# Patient Record
Sex: Female | Born: 1955 | Race: Black or African American | Hispanic: No | Marital: Single | Smoking: Never smoker
Health system: Southern US, Community
[De-identification: ages and names within clinical notes are randomized; demographics above are authoritative.]

## PROBLEM LIST (undated history)

## (undated) DIAGNOSIS — H1851 Endothelial corneal dystrophy: Secondary | ICD-10-CM

## (undated) DIAGNOSIS — K829 Disease of gallbladder, unspecified: Secondary | ICD-10-CM

## (undated) DIAGNOSIS — F32A Depression, unspecified: Secondary | ICD-10-CM

## (undated) DIAGNOSIS — R011 Cardiac murmur, unspecified: Secondary | ICD-10-CM

## (undated) DIAGNOSIS — R600 Localized edema: Secondary | ICD-10-CM

## (undated) DIAGNOSIS — I10 Essential (primary) hypertension: Secondary | ICD-10-CM

## (undated) DIAGNOSIS — H269 Unspecified cataract: Secondary | ICD-10-CM

## (undated) DIAGNOSIS — H18519 Endothelial corneal dystrophy, unspecified eye: Secondary | ICD-10-CM

## (undated) DIAGNOSIS — N6452 Nipple discharge: Secondary | ICD-10-CM

## (undated) DIAGNOSIS — C55 Malignant neoplasm of uterus, part unspecified: Secondary | ICD-10-CM

## (undated) DIAGNOSIS — F329 Major depressive disorder, single episode, unspecified: Secondary | ICD-10-CM

## (undated) DIAGNOSIS — N6320 Unspecified lump in the left breast, unspecified quadrant: Secondary | ICD-10-CM

## (undated) DIAGNOSIS — N8500 Endometrial hyperplasia, unspecified: Secondary | ICD-10-CM

## (undated) DIAGNOSIS — E119 Type 2 diabetes mellitus without complications: Secondary | ICD-10-CM

## (undated) HISTORY — DX: Depression, unspecified: F32.A

## (undated) HISTORY — DX: Malignant neoplasm of uterus, part unspecified: C55

## (undated) HISTORY — DX: Endothelial corneal dystrophy, unspecified eye: H18.519

## (undated) HISTORY — DX: Nipple discharge: N64.52

## (undated) HISTORY — DX: Localized edema: R60.0

## (undated) HISTORY — DX: Essential (primary) hypertension: I10

## (undated) HISTORY — DX: Major depressive disorder, single episode, unspecified: F32.9

## (undated) HISTORY — DX: Disease of gallbladder, unspecified: K82.9

## (undated) HISTORY — DX: Endothelial corneal dystrophy: H18.51

---

## 1987-02-21 HISTORY — PX: CHOLECYSTECTOMY: SHX55

## 2002-05-27 ENCOUNTER — Encounter: Payer: Self-pay | Admitting: Obstetrics

## 2002-05-27 ENCOUNTER — Ambulatory Visit (HOSPITAL_COMMUNITY): Admission: RE | Admit: 2002-05-27 | Discharge: 2002-05-27 | Payer: Self-pay | Admitting: Obstetrics

## 2005-05-30 ENCOUNTER — Encounter: Admission: RE | Admit: 2005-05-30 | Discharge: 2005-05-30 | Payer: Self-pay | Admitting: Internal Medicine

## 2005-05-30 IMAGING — US US RENAL
1 series · 14 of 25 positions shown · non-contrast
Comparison: none

CLINICAL DATA: Hematuria

Renal ultrasound:
No previous for comparison. Right kidney measures at least 12 cm in length, as
does the left. Negative for renal mass or hydronephrosis. Urinary bladder is
incompletely distended, unremarkable.

[Series 1: unknown · 0.26mm/px · 14 of 29 slices shown]
[im 1/29]
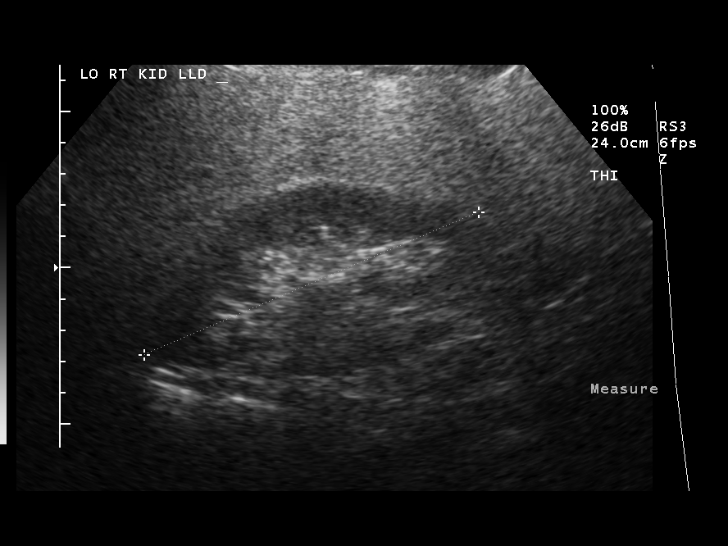
[im 3/29]
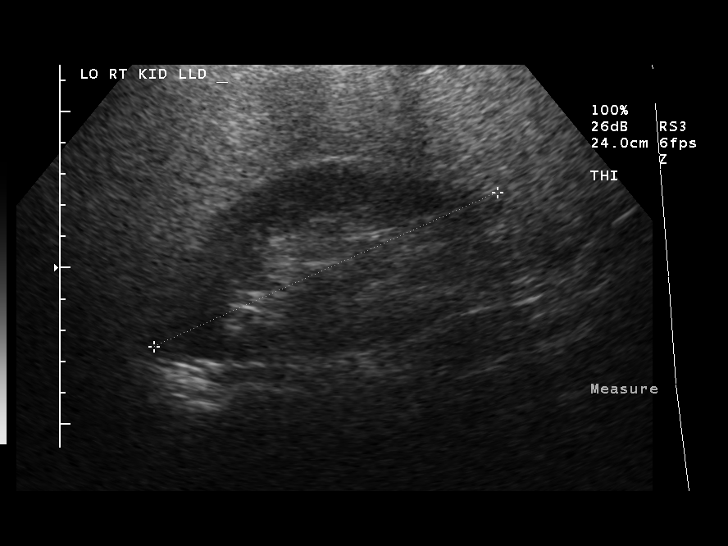
[im 5/29]
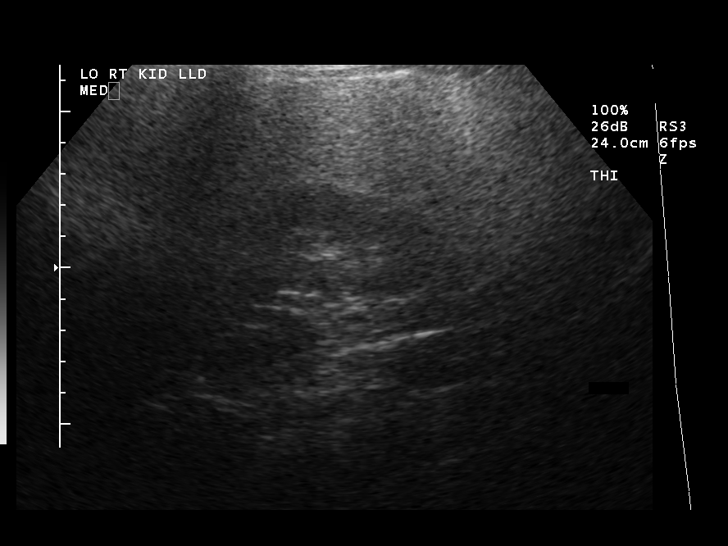
[im 8/29]
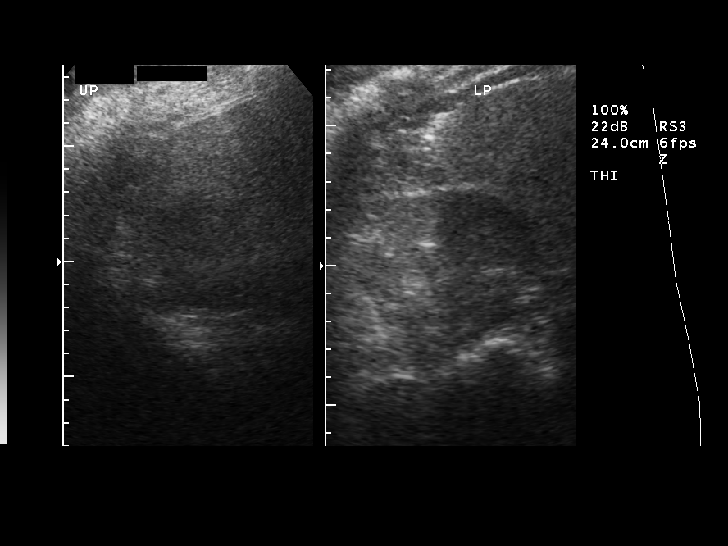
[im 10/29]
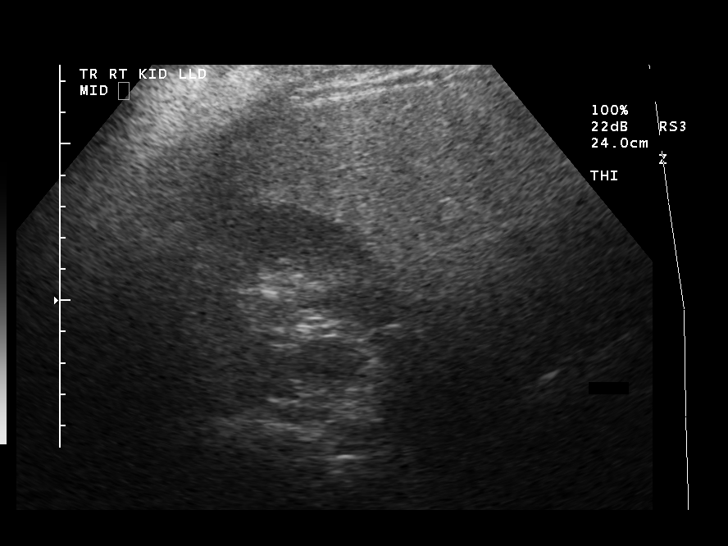
[im 11/29]
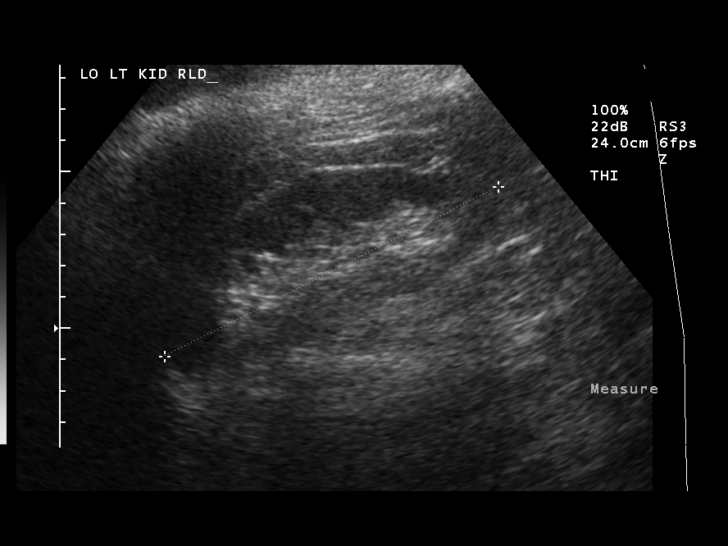
[im 13/29]
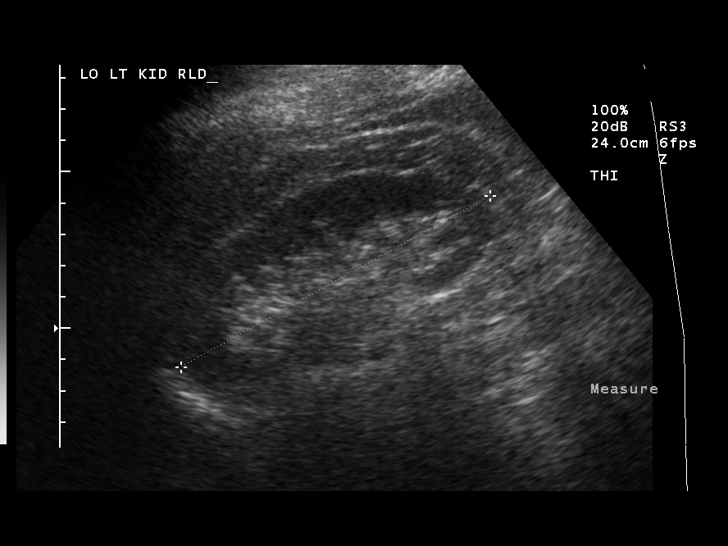
[im 16/29]
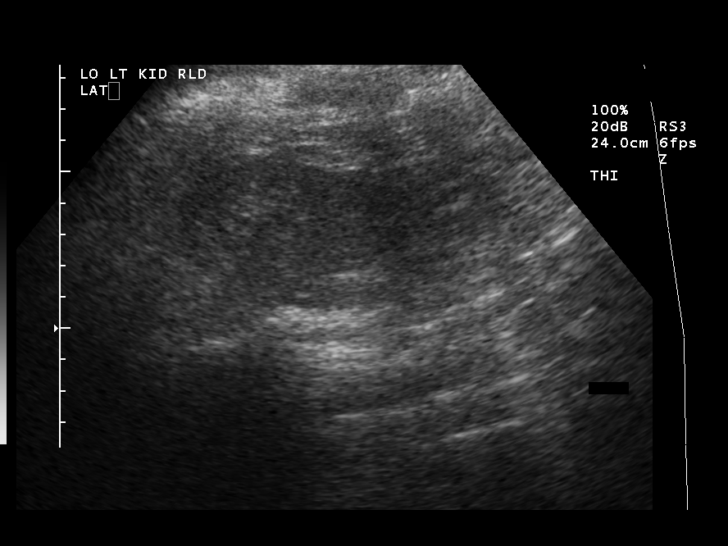
[im 18/29]
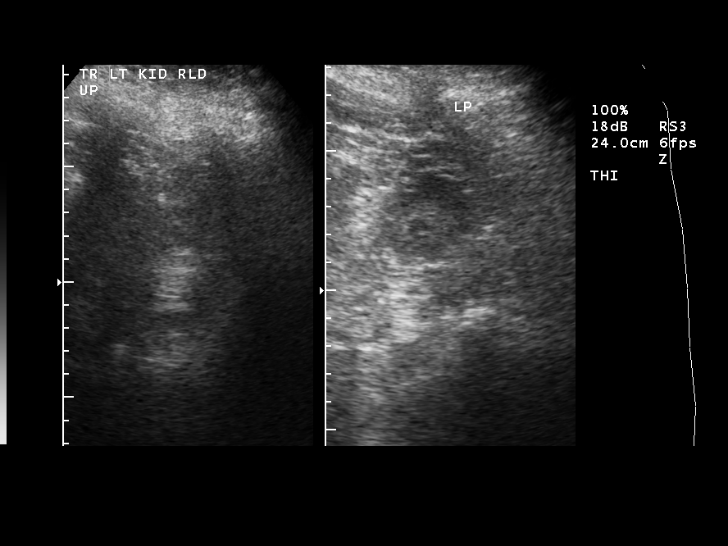
[im 19/29]
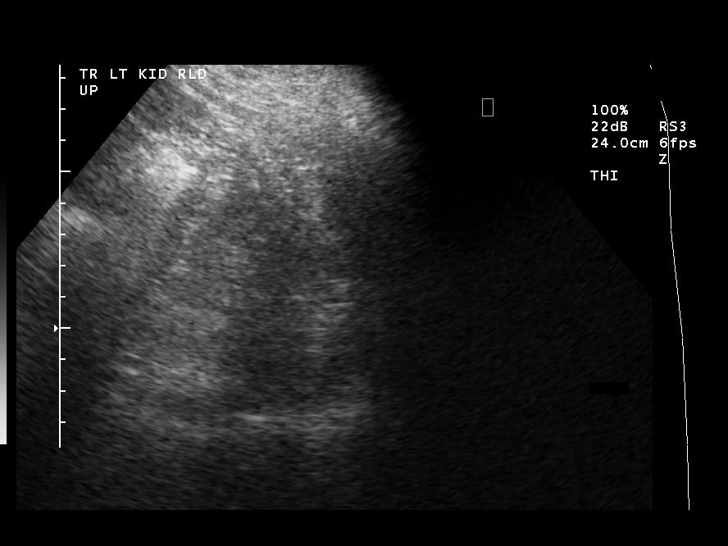
[im 22/29]
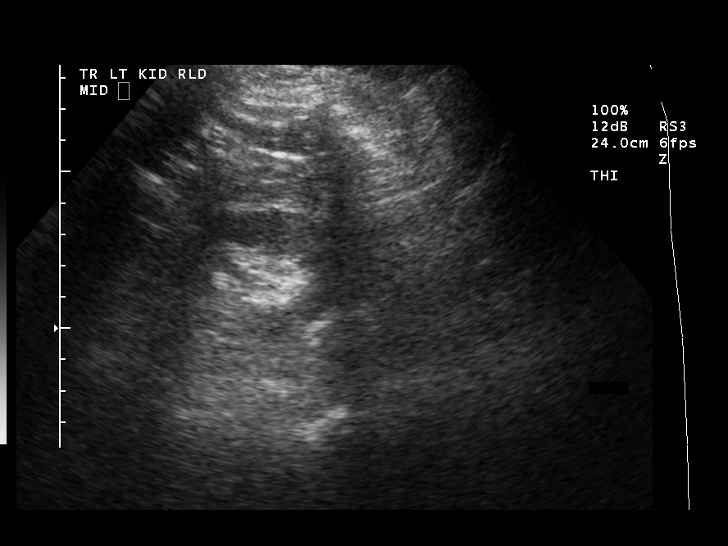
[im 24/29]
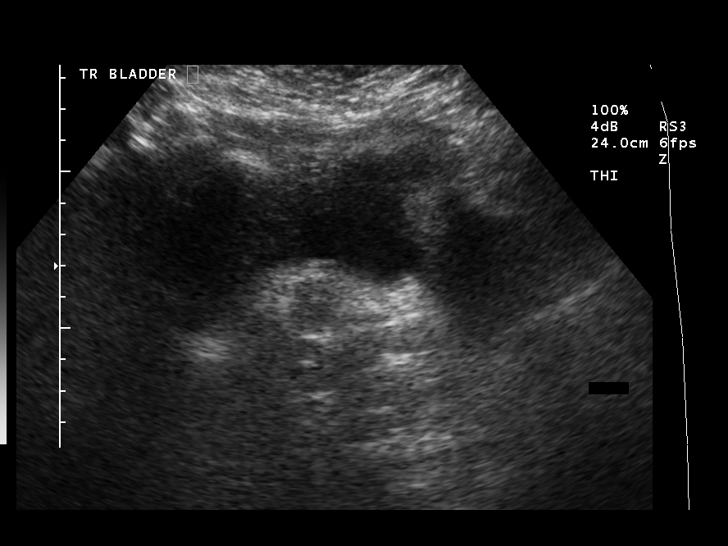
[im 26/29]
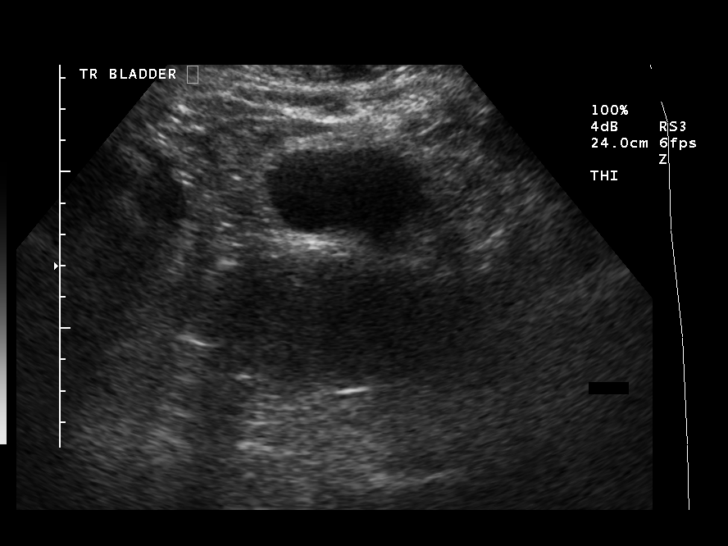
[im 29/29]
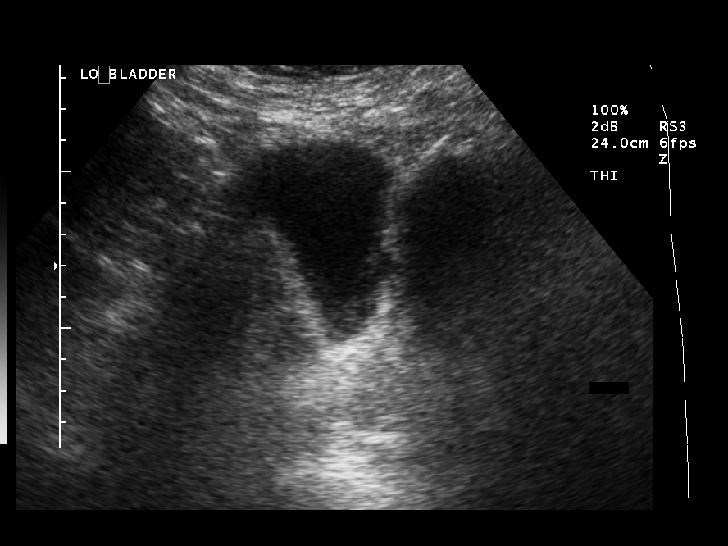

[14 of 25 positions shown; findings below may reference images not displayed]

IMPRESSION: 1. Negative

## 2007-02-05 IMAGING — CT CT HEAD W/O CM
2 series · 16 of 30 positions shown, 20 images · non-contrast
Comparison: NONE

CLINICAL DATA: Headaches, worse when bending over or moving 
suddenly.  

CT HEAD WITHOUT INTRAVENOUS CONTRAST
TECHNIQUE: Axial 5.0-mm-thick slices were obtained through the 
posterior fossa and 5.0-mm-thick slices were obtained through the 
remaining portion of the head without intravenous contrast.

[Series 2: without contrast · axial · non-contrast · 0.49mm/px · z∈[+142,+282]mm · 13 of 34 slices shown, 17 images]
[im 3/34  brain]
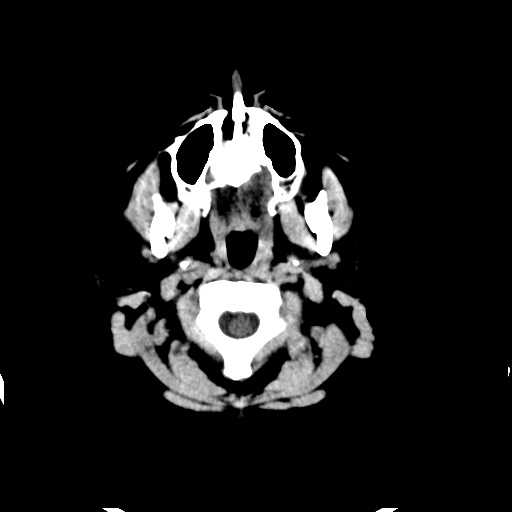
[im 3/34  bone]
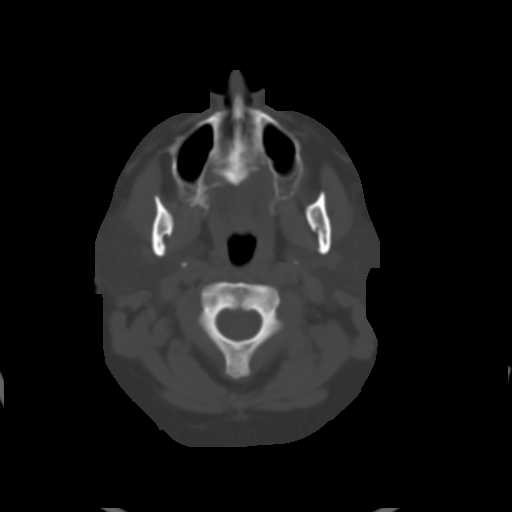
[im 5/34  brain]
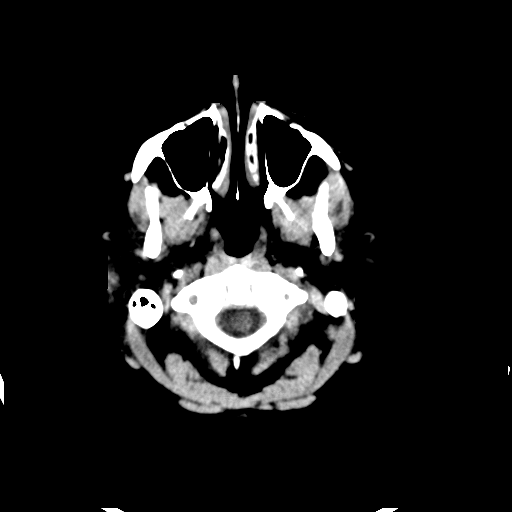
[im 8/34  brain]
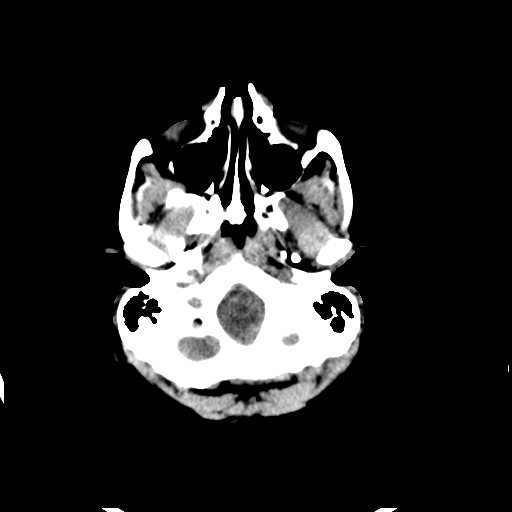
[im 10/34  brain]
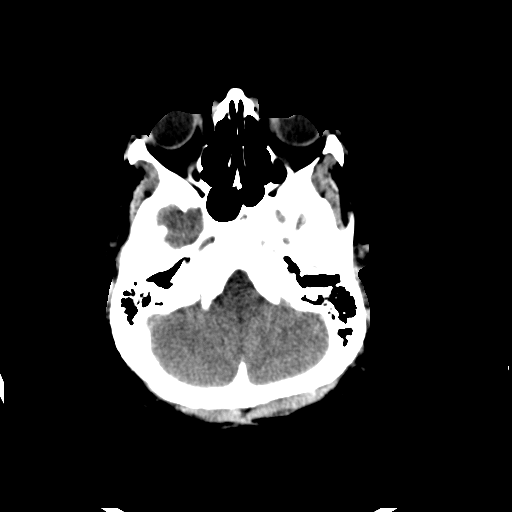
[im 12/34  brain]
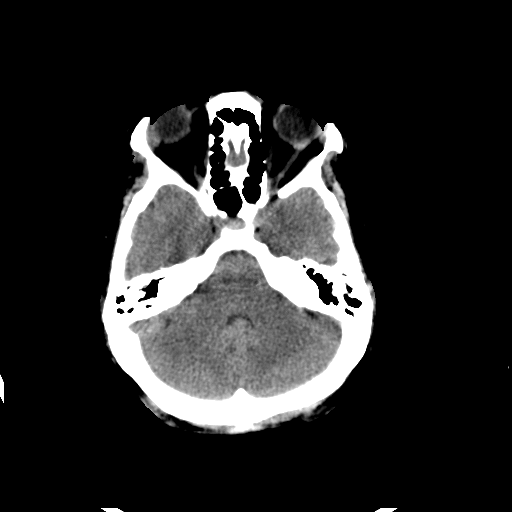
[im 12/34  bone]
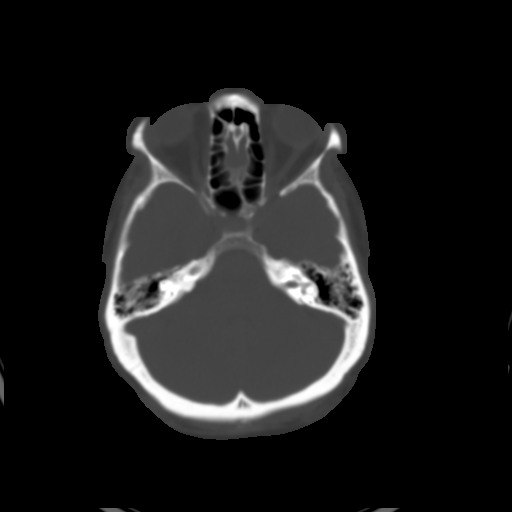
[im 15/34  brain]
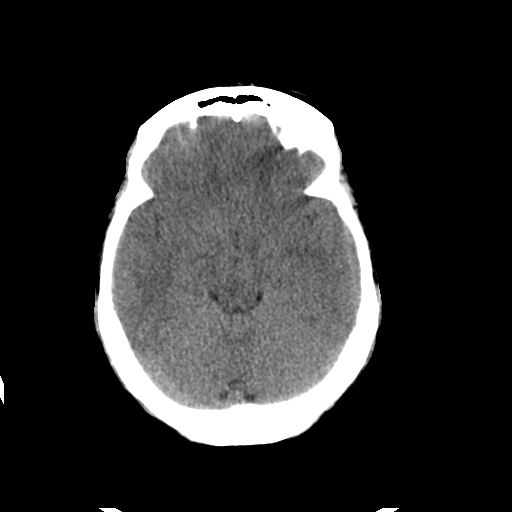
[im 17/34  brain]
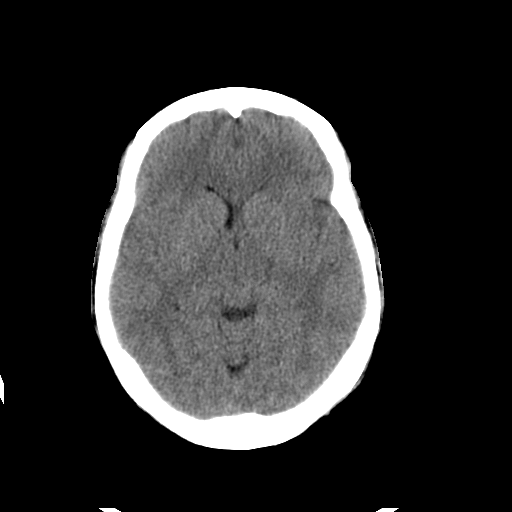
[im 19/34  brain]
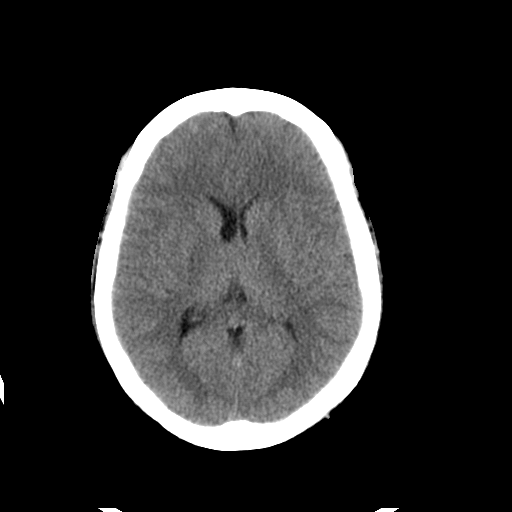
[im 22/34  brain]
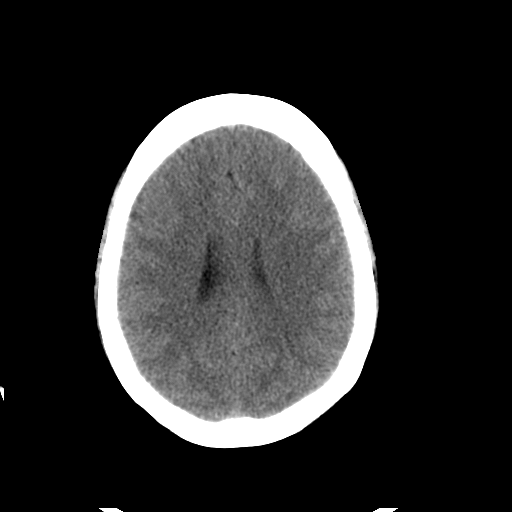
[im 22/34  bone]
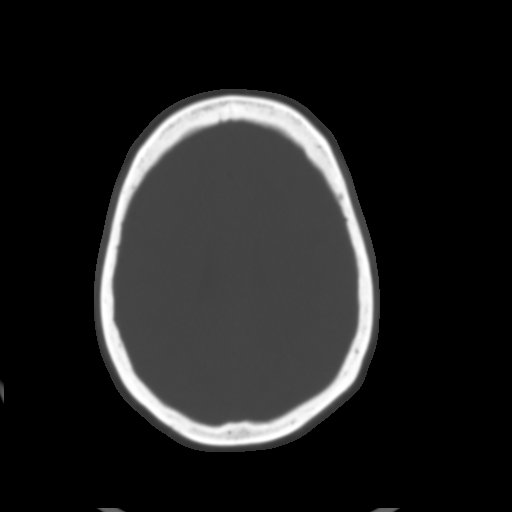
[im 24/34  brain]
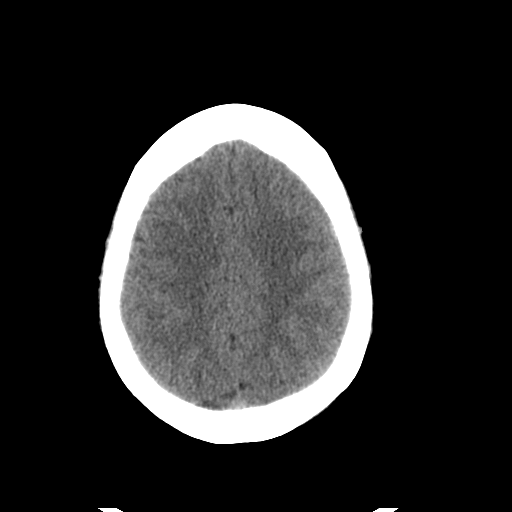
[im 26/34  brain]
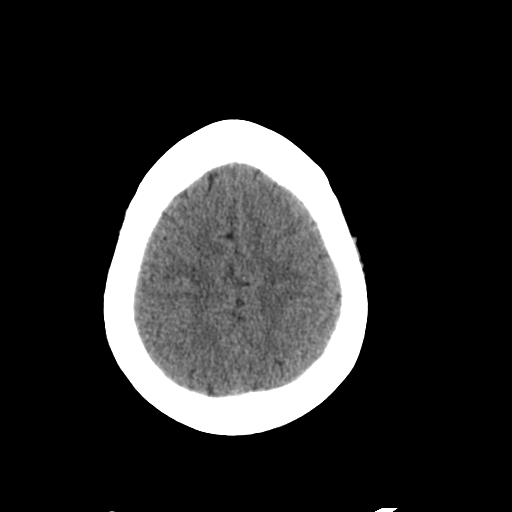
[im 29/34  brain]
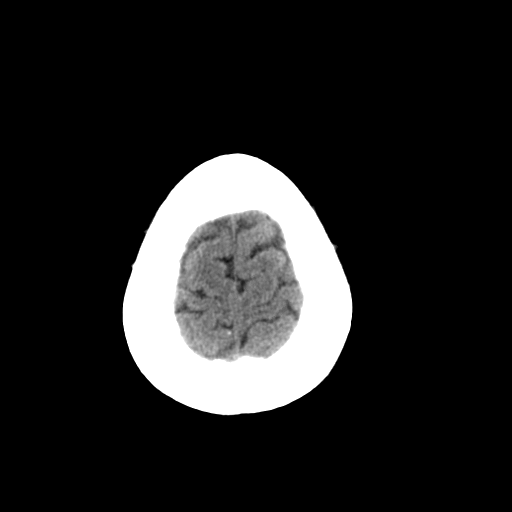
[im 31/34  brain]
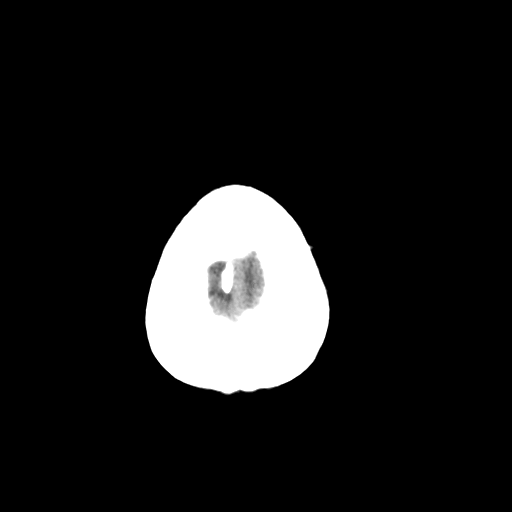
[im 31/34  bone]
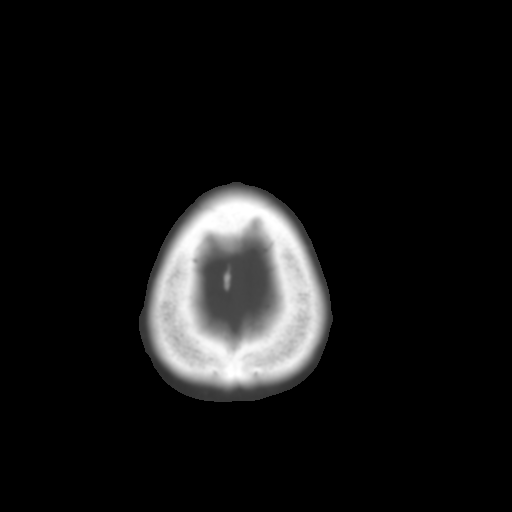

[Series 3: bone windows · axial · 0.49mm/px · z∈[+142,+187]mm · 3 of 34 slices shown]
[im 3/34  bone]
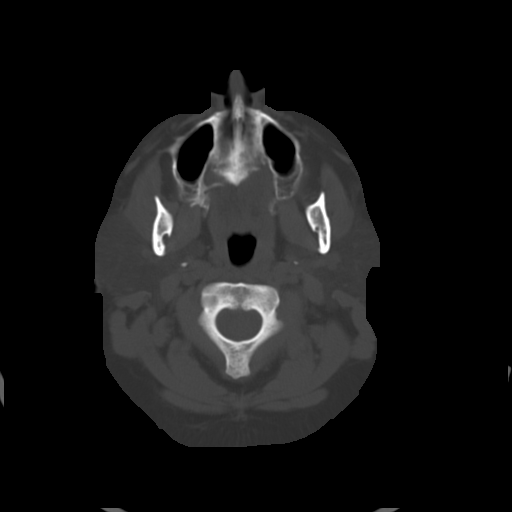
[im 8/34  bone]
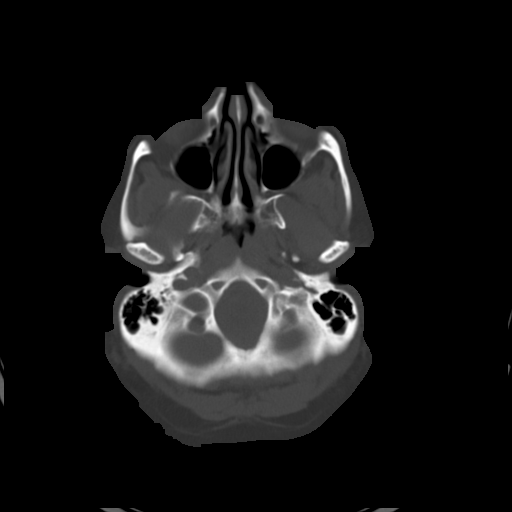
[im 12/34  bone]
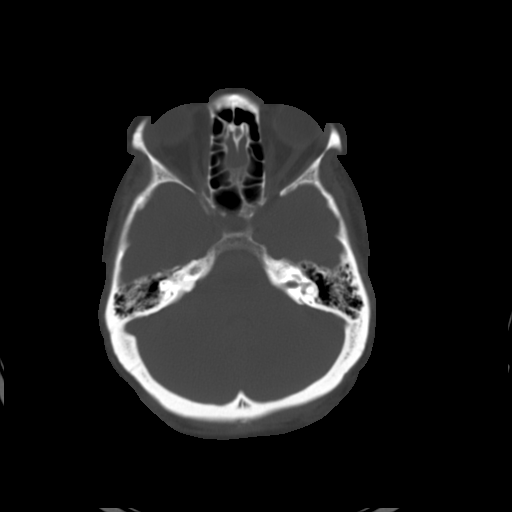

[16 of 30 positions shown; findings below may reference images not displayed]

FINDINGS: The visualized portions of the paranasal sinuses, 
orbits, and mastoids are unremarkable in appearance. The fourth, 
third and both lateral ventricles are identified.  There is no 
evidence of midline shift or mass effect infratentorially or 
supratentorially. No areas of abnormal radiolucency or 
radiodensity are identified. There is no evidence of subarachnoid 
or intracerebral hemorrhage.  There is no evidence of subdural or 
epidural hematoma. The calvarium is intact.
IMPRESSION: Normal unenhanced CT of the head. LAAOUINA Date:  [DATE] DAS  JLM

## 2007-04-08 ENCOUNTER — Encounter: Admission: RE | Admit: 2007-04-08 | Discharge: 2007-04-08 | Payer: Self-pay

## 2007-09-30 ENCOUNTER — Encounter: Admission: RE | Admit: 2007-09-30 | Discharge: 2007-11-12 | Payer: Self-pay | Admitting: Psychiatry

## 2008-02-13 ENCOUNTER — Emergency Department (HOSPITAL_COMMUNITY): Admission: EM | Admit: 2008-02-13 | Discharge: 2008-02-13 | Payer: Self-pay | Admitting: Emergency Medicine

## 2008-02-13 IMAGING — CR DG ANKLE COMPLETE 3+V*L*
3 series · 3 of 3 positions shown · non-contrast
Comparison: None

CLINICAL DATA: Pain.  No injury.

LEFT ANKLE COMPLETE - 3+ VIEW

[view not recorded (1 of 3)]
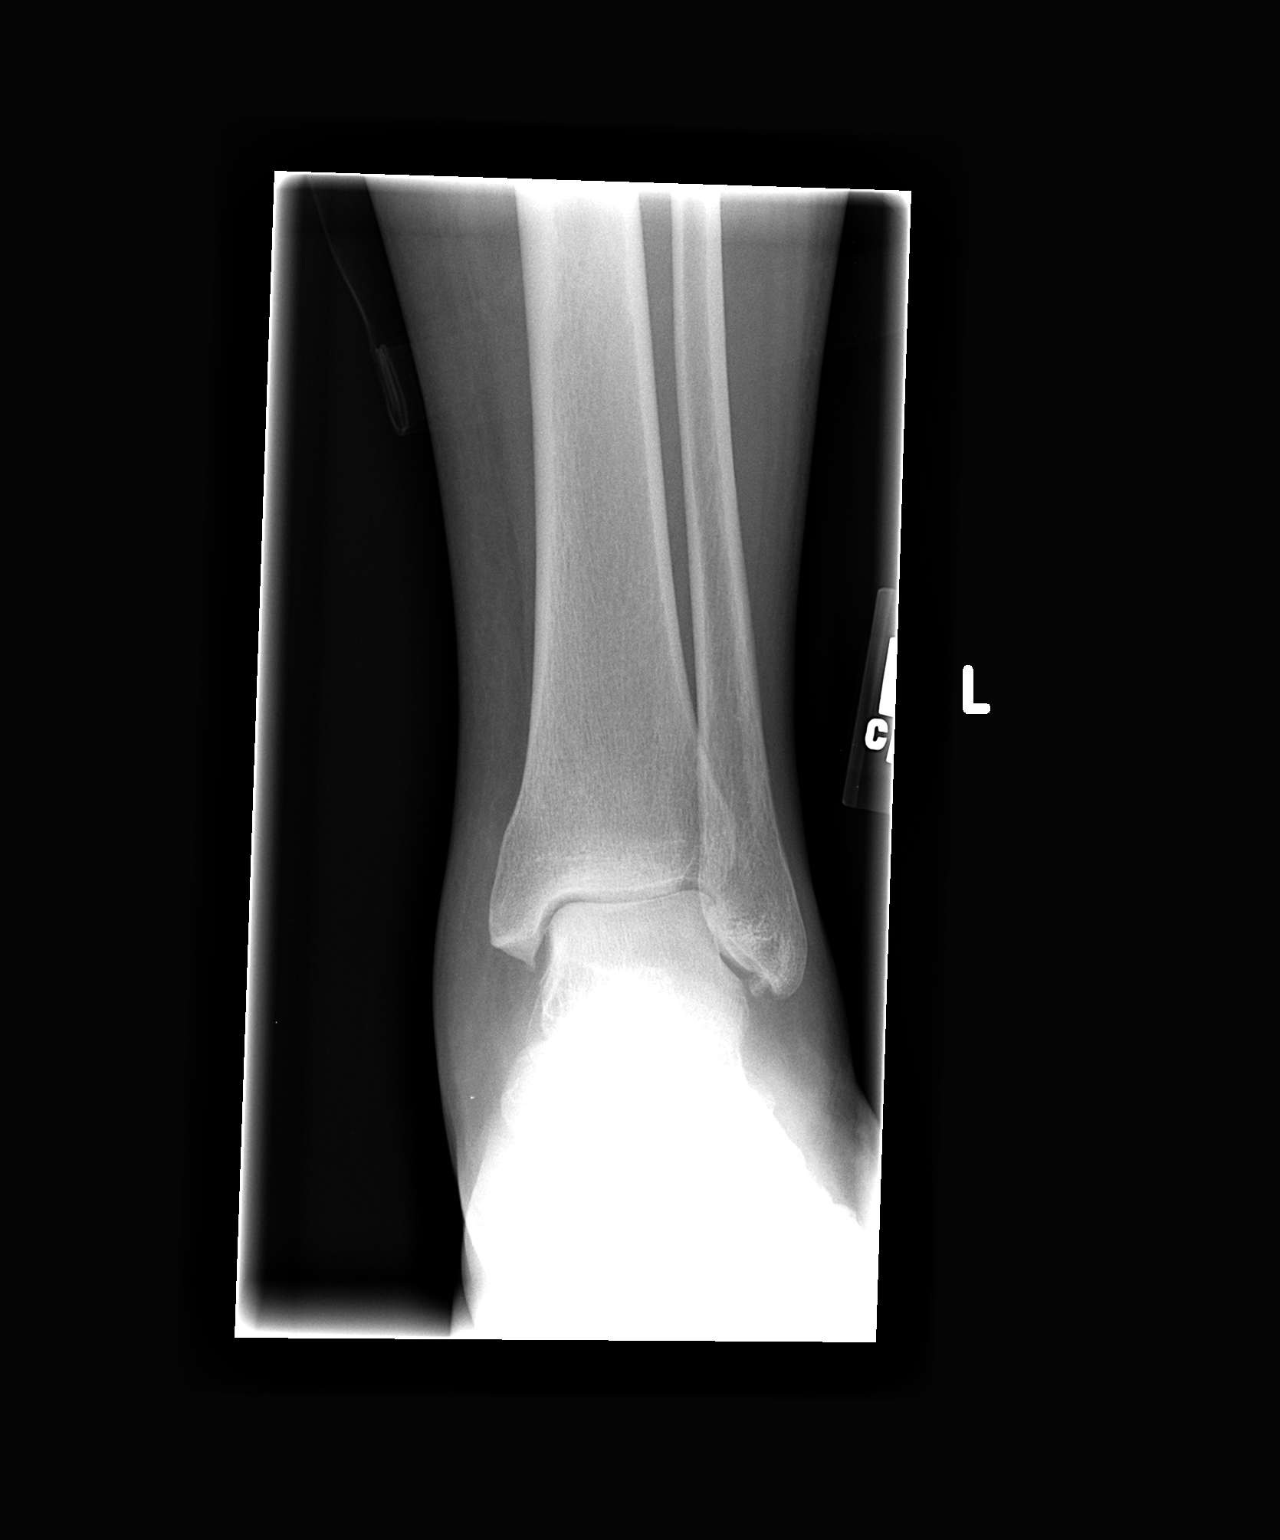

[view not recorded (2 of 3)]
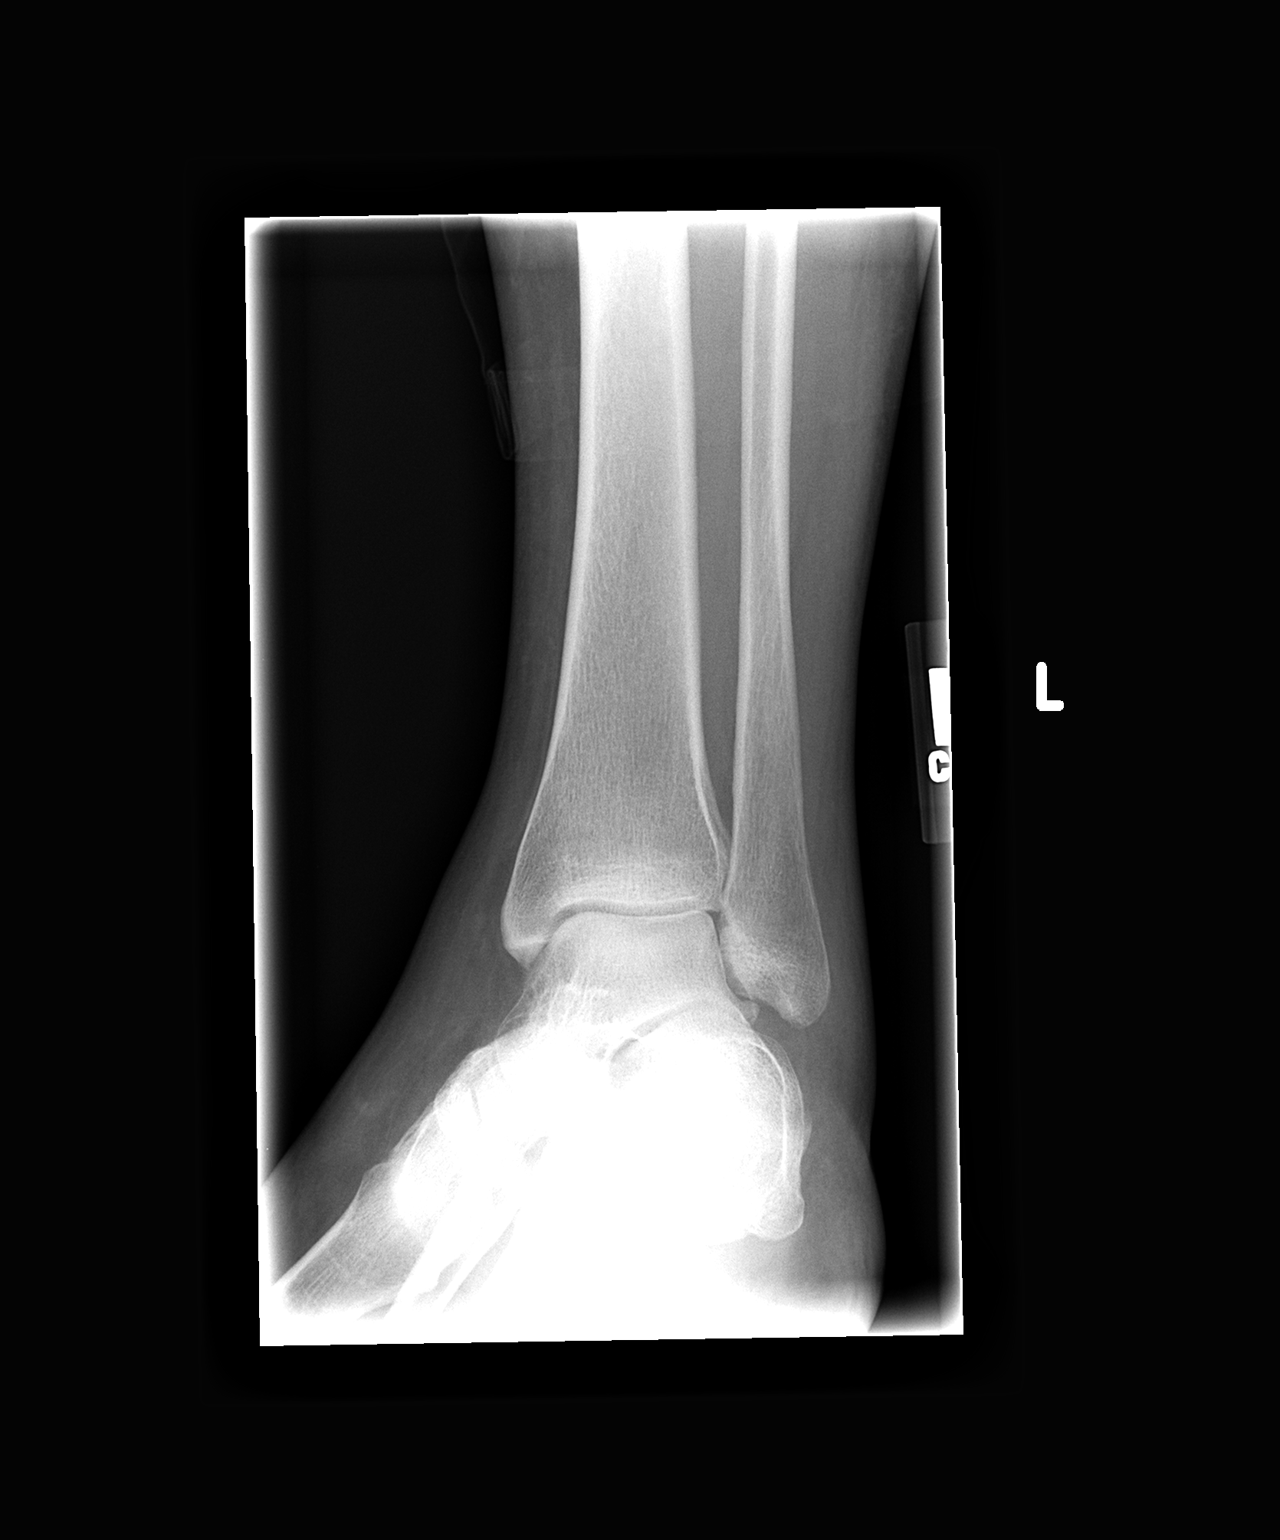

[view not recorded (3 of 3)]
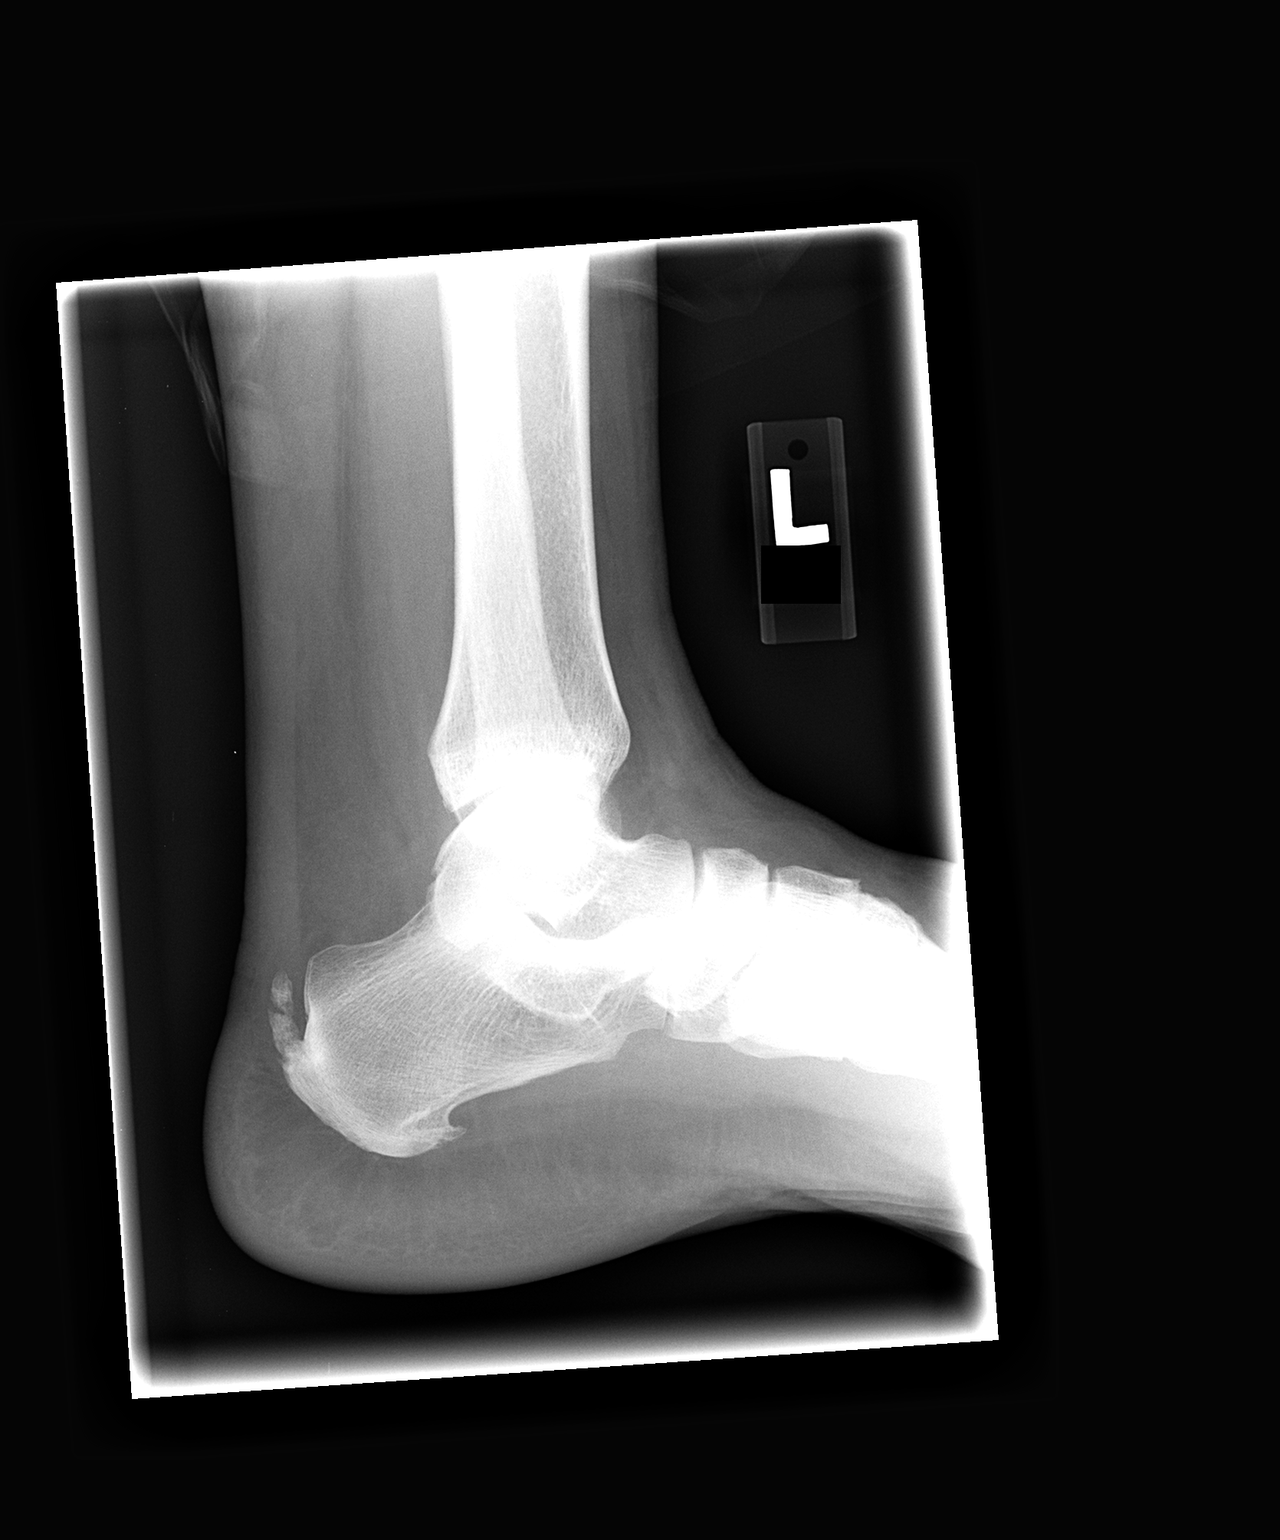

[3 of 3 positions shown; findings below may reference images not displayed]

FINDINGS: Negative for ankle fracture or effusion.  There is
calcaneal spurring, most severe in the distal Achilles tendon.
There is an ossicle below the distal fibula.
IMPRESSION: No acute bony abnormality.

Calcaneal spurring.

## 2008-02-13 IMAGING — CR DG OS CALCIS 2+V*L*
2 series · 2 of 2 positions shown · non-contrast
Comparison: none

CLINICAL DATA: Pain, no injury

LEFT OS CALCIS - 2+ VIEW

[view not recorded (1 of 2)]
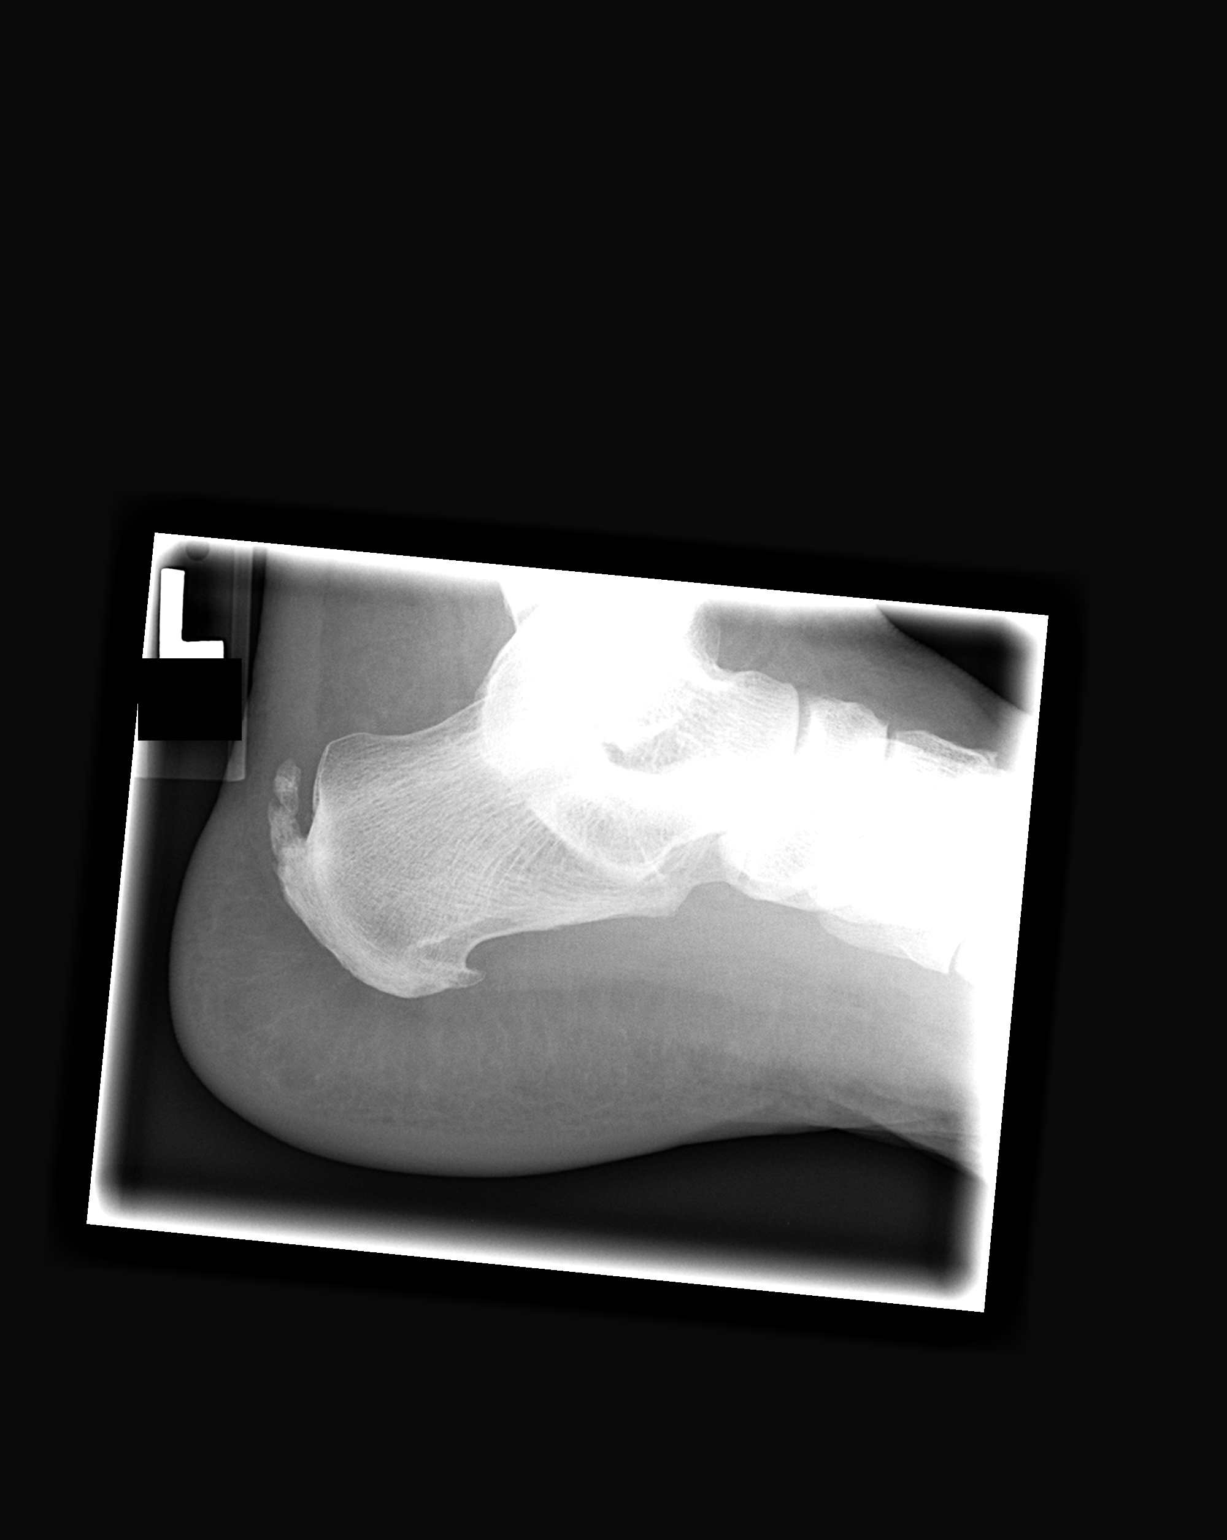

[view not recorded (2 of 2)]
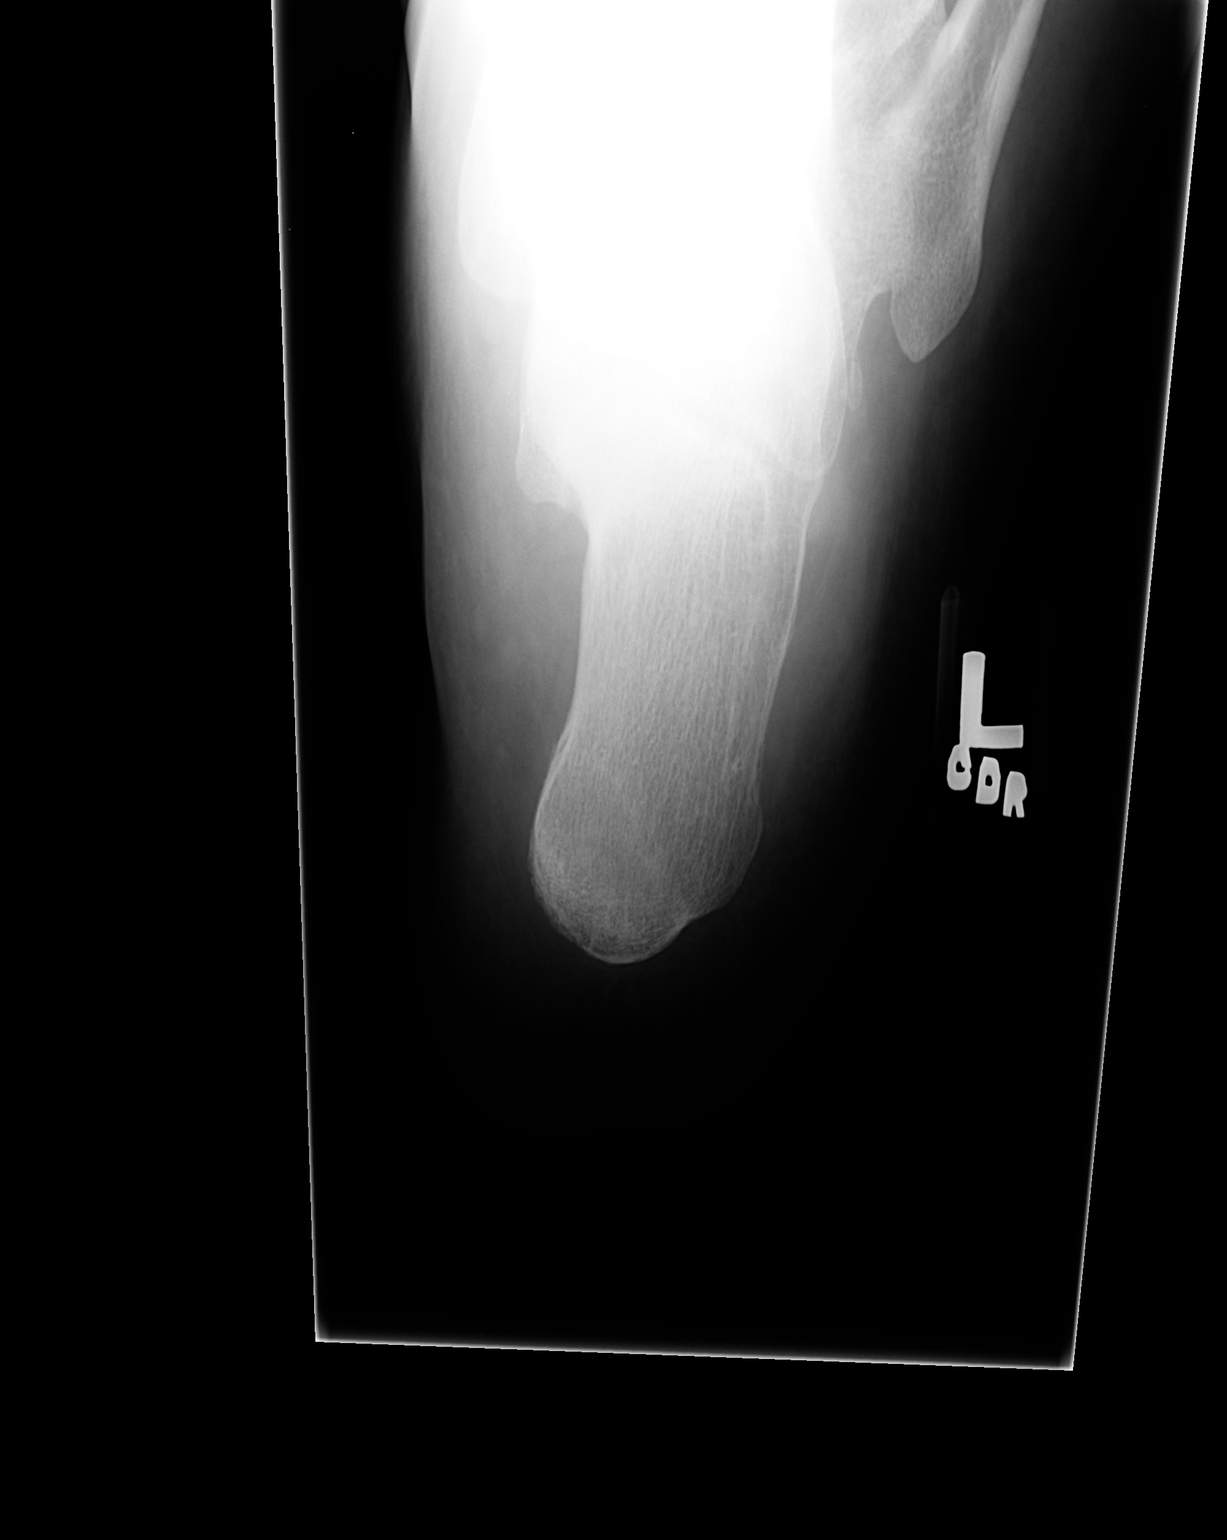

[2 of 2 positions shown; findings below may reference images not displayed]

FINDINGS: Negative for fracture.  There is calcaneal spurring.
There is extensive spurring of the distal Achilles tendon.
IMPRESSION: Negative for fracture.

## 2008-10-23 ENCOUNTER — Ambulatory Visit: Payer: Self-pay | Admitting: Diagnostic Radiology

## 2008-10-23 ENCOUNTER — Encounter: Payer: Self-pay | Admitting: Emergency Medicine

## 2008-10-23 ENCOUNTER — Observation Stay (HOSPITAL_COMMUNITY): Admission: AD | Admit: 2008-10-23 | Discharge: 2008-10-24 | Payer: Self-pay | Admitting: Internal Medicine

## 2008-10-23 IMAGING — CR DG CHEST 2V
2 series · 2 of 2 positions shown · non-contrast
Comparison: None

CLINICAL DATA: Numbness for 1 week.  High blood pressure.

CHEST - 2 VIEW

[w chest pa]
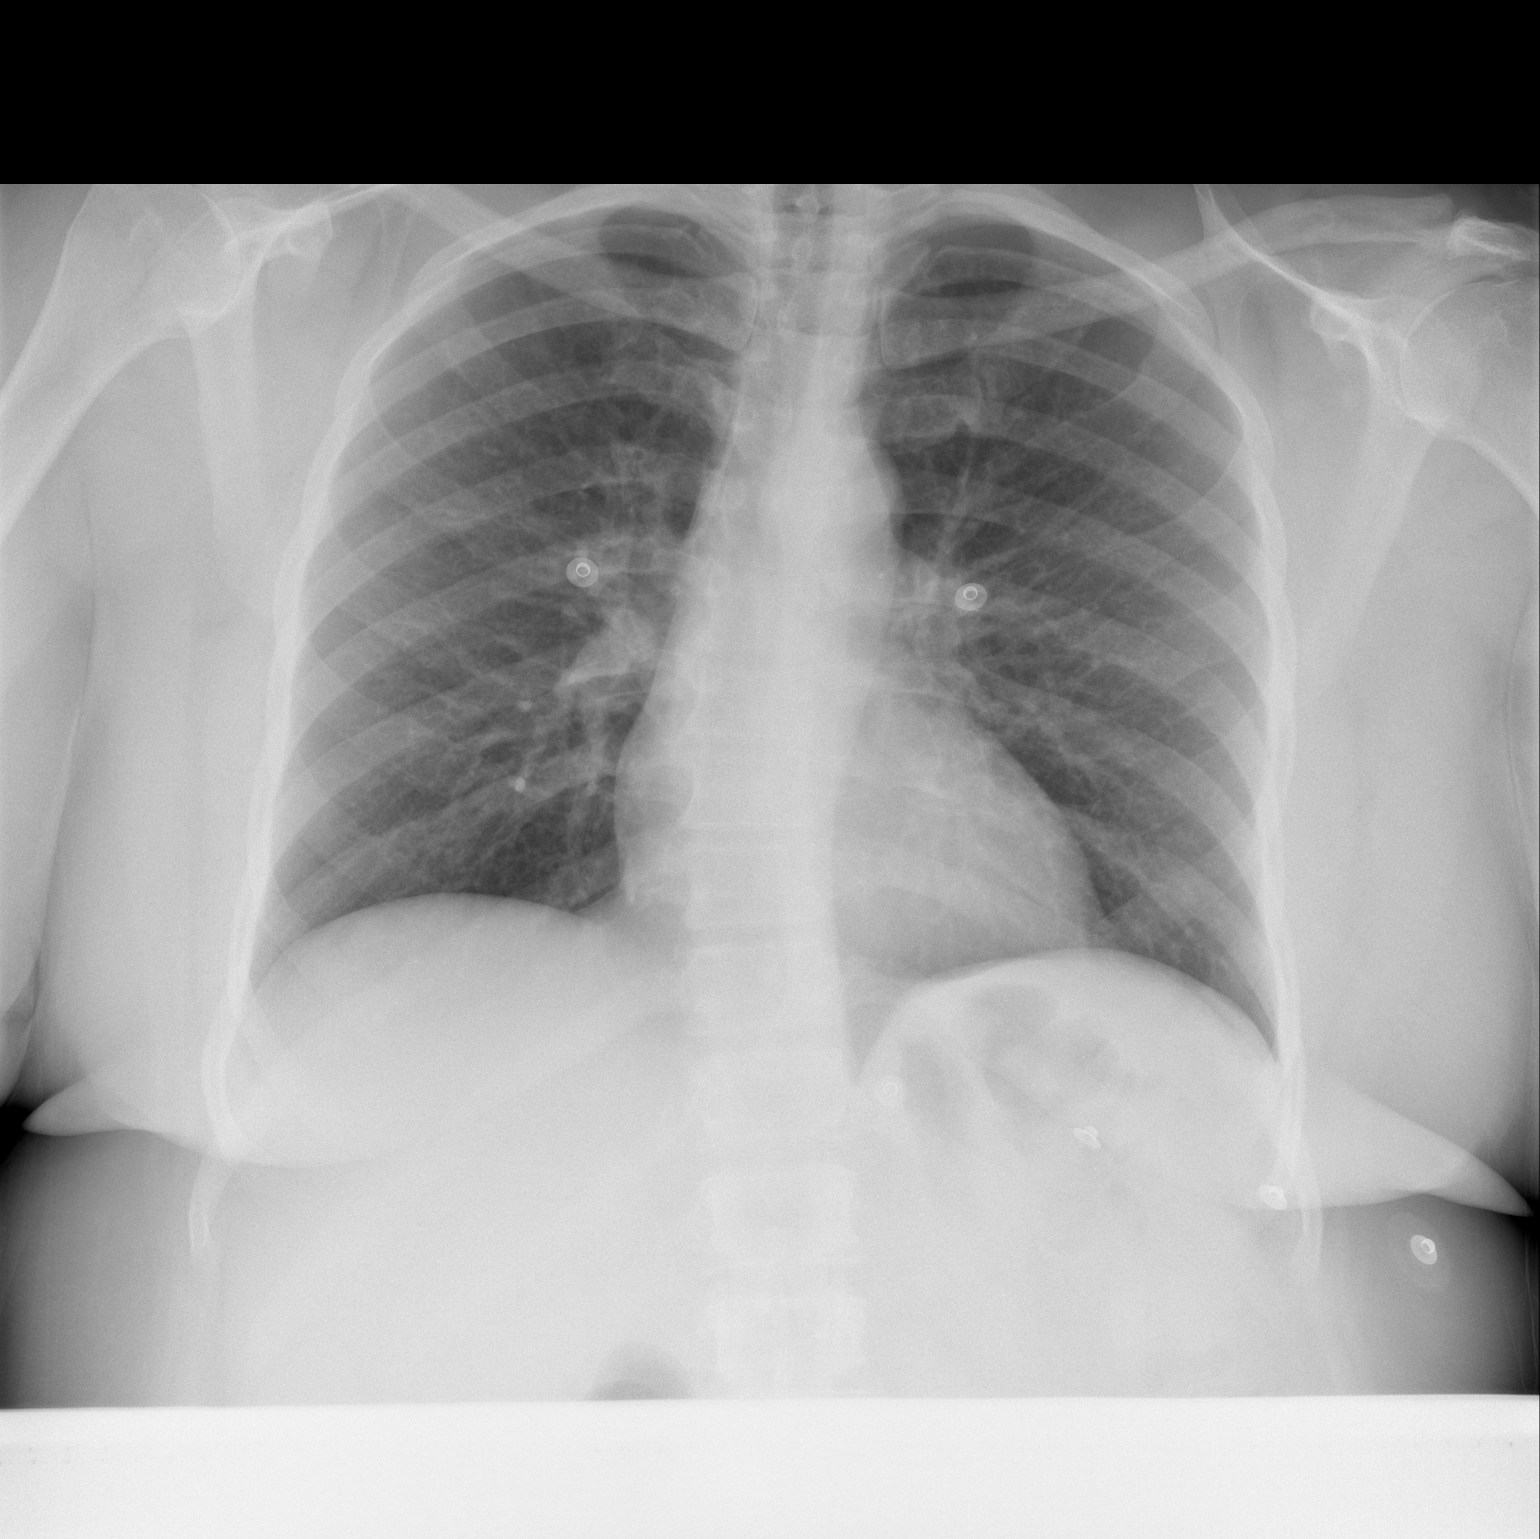

[w chest lat]
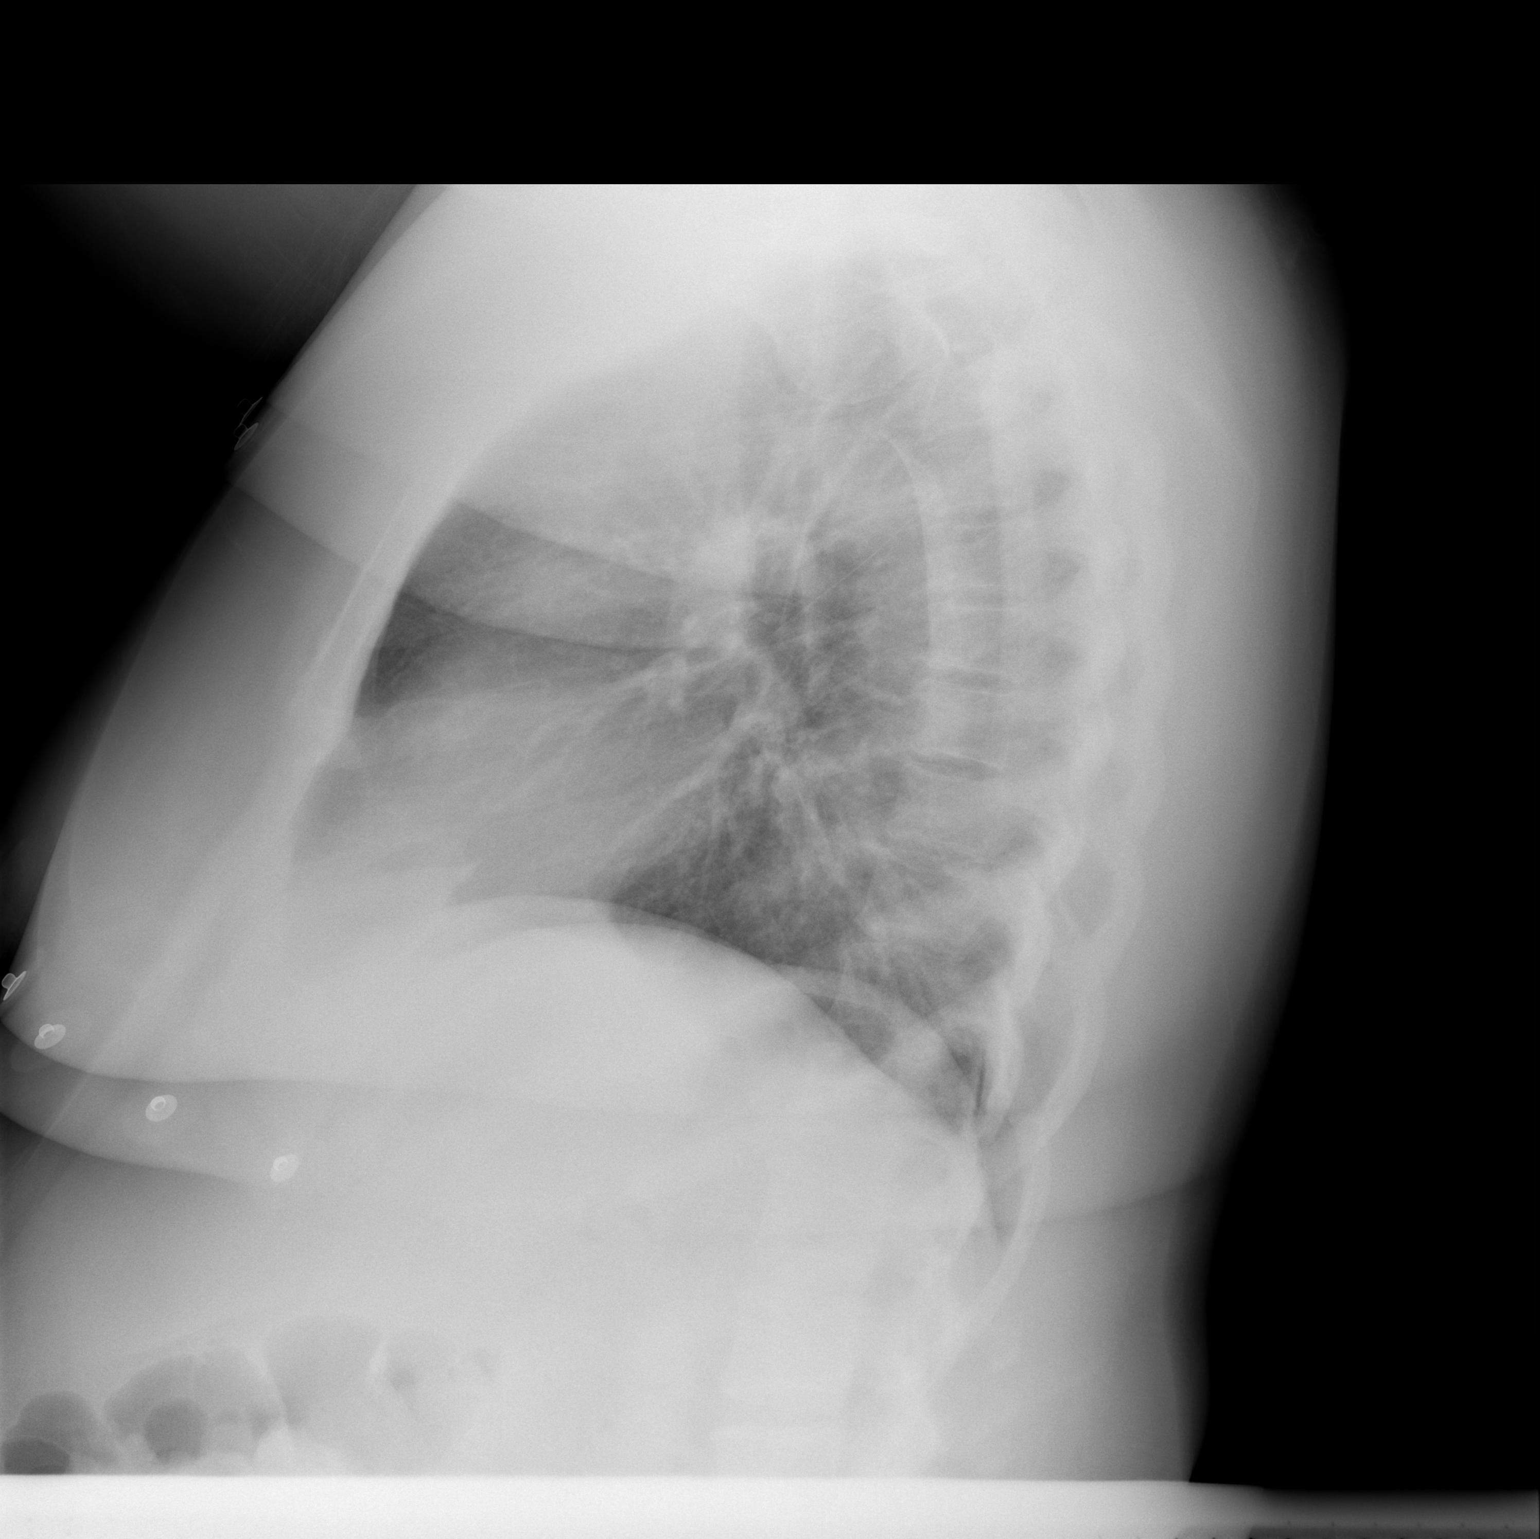

[2 of 2 positions shown; findings below may reference images not displayed]

FINDINGS: Heart size is normal.  Lungs are free of focal
consolidations and pleural effusions.  There is no evidence for
pulmonary edema.  There is mild convex right scoliosis of the
thoracic spine.
IMPRESSION: No evidence for acute cardiopulmonary abnormality.

## 2008-10-23 IMAGING — CT CT HEAD W/O CM
1 series · 16 of 30 positions shown, 20 images · non-contrast
Comparison: None

CLINICAL DATA: Numbness of the left arm.  Hypertension.

CT HEAD WITHOUT CONTRAST
TECHNIQUE: Contiguous axial images were obtained from the base of
the skull through the vertex without contrast.

[Series 2: head 4.8 h37s · axial · 0.40mm/px · z∈[-144,-8]mm · 16 of 32 slices shown, 20 images]
[im 2/32  brain]
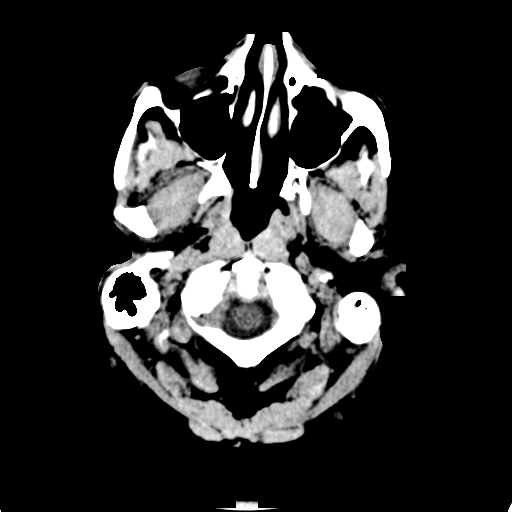
[im 2/32  bone]
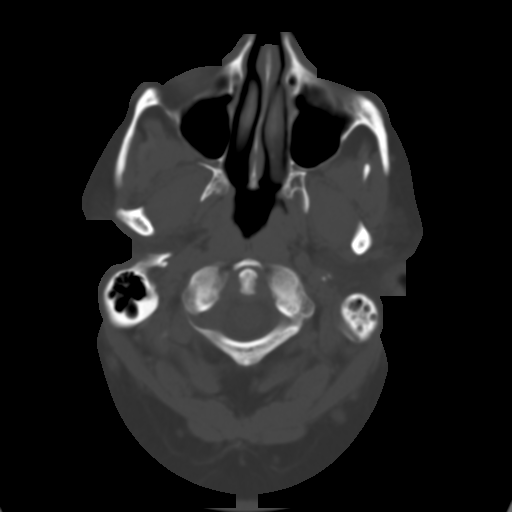
[im 4/32  brain]
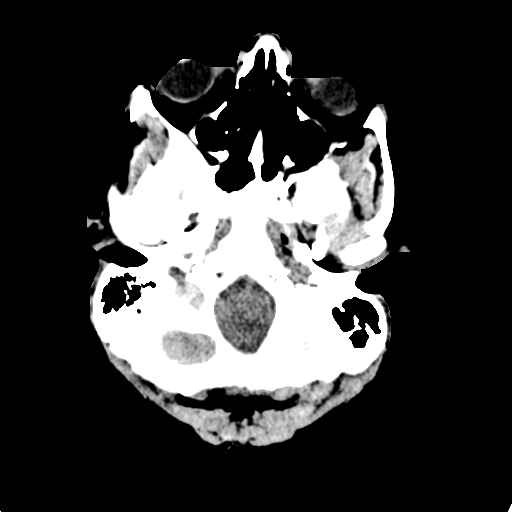
[im 6/32  brain]
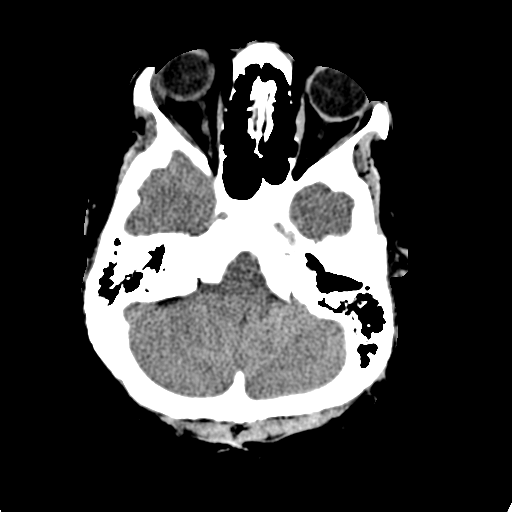
[im 8/32  brain]
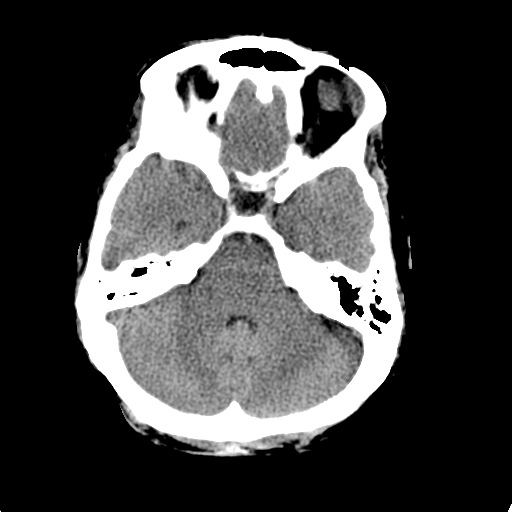
[im 9/32  brain]
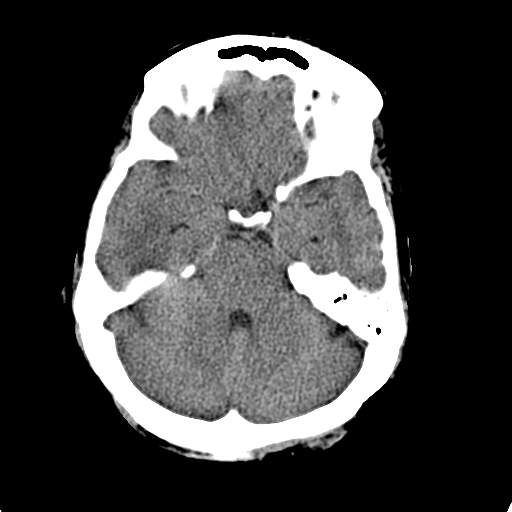
[im 9/32  bone]
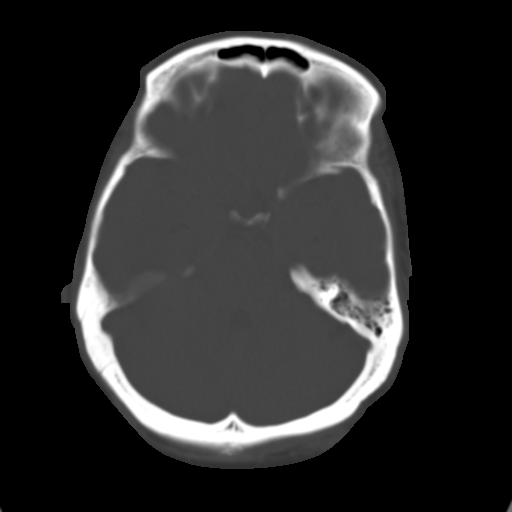
[im 11/32  brain]
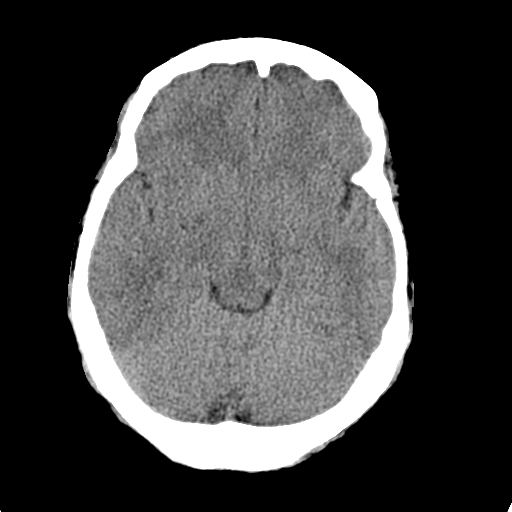
[im 13/32  brain]
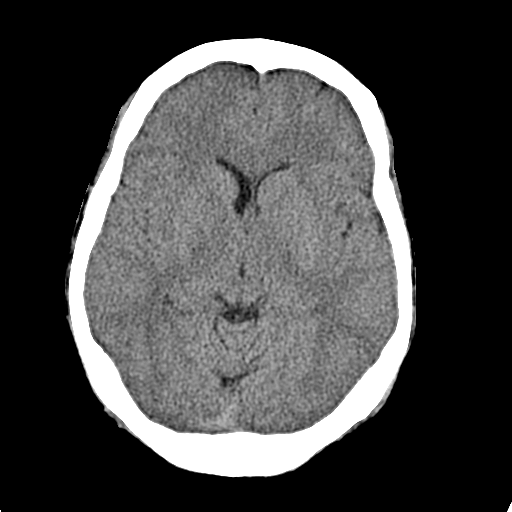
[im 15/32  brain]
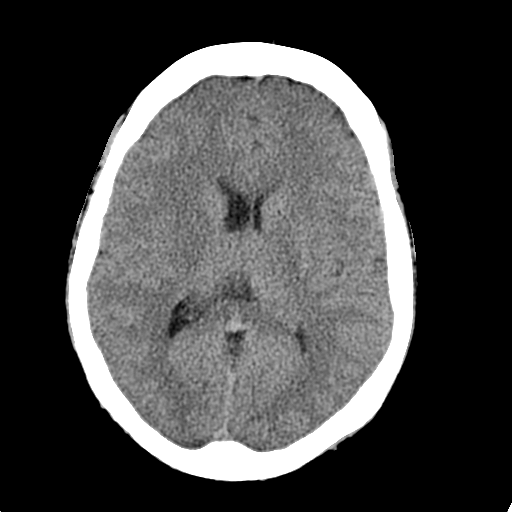
[im 17/32  brain]
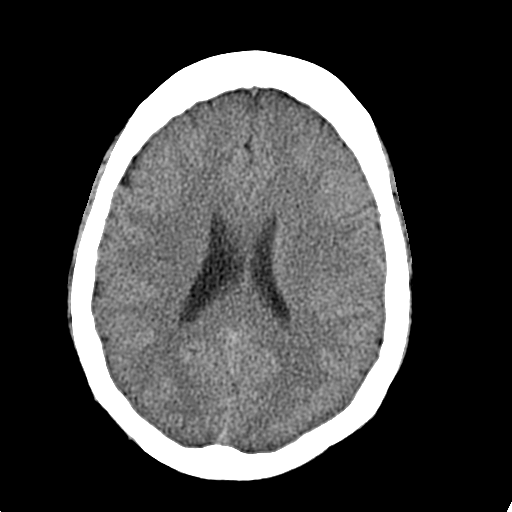
[im 17/32  bone]
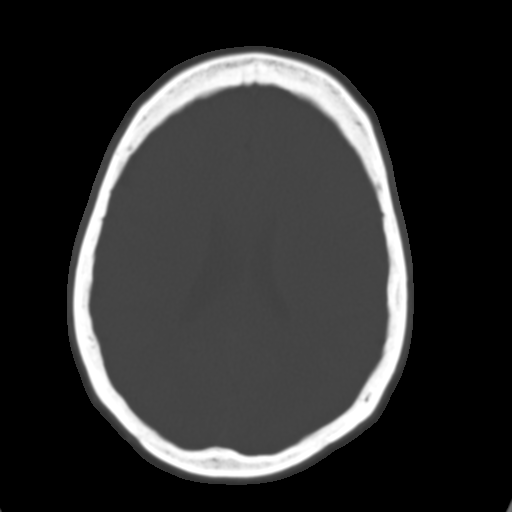
[im 19/32  brain]
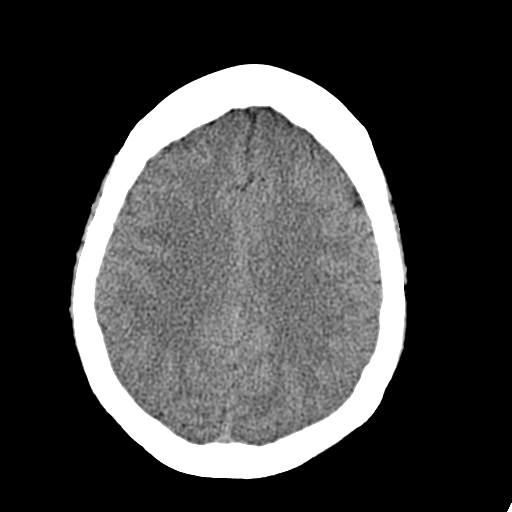
[im 21/32  brain]
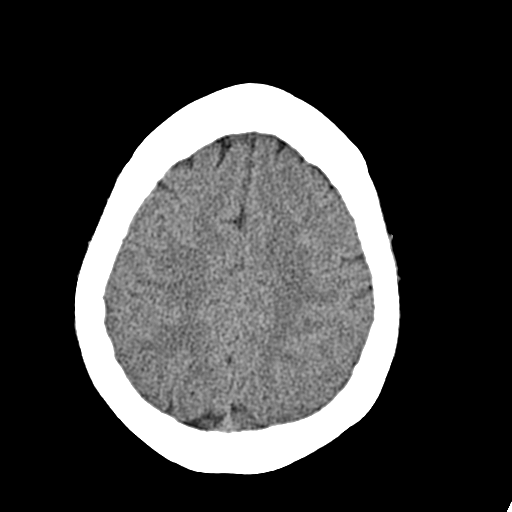
[im 23/32  brain]
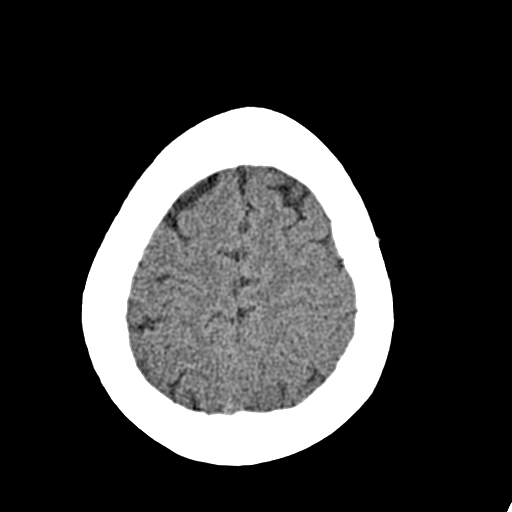
[im 24/32  brain]
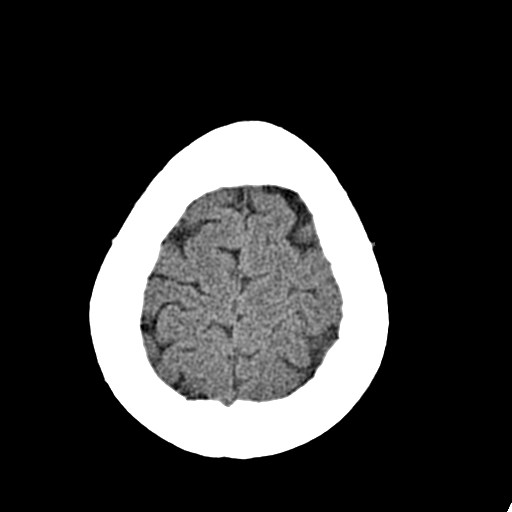
[im 24/32  bone]
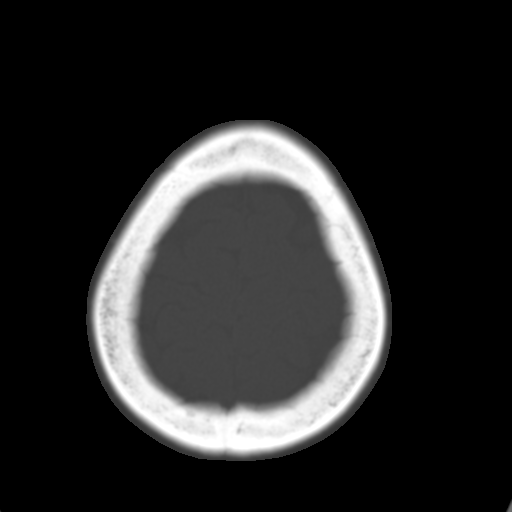
[im 26/32  brain]
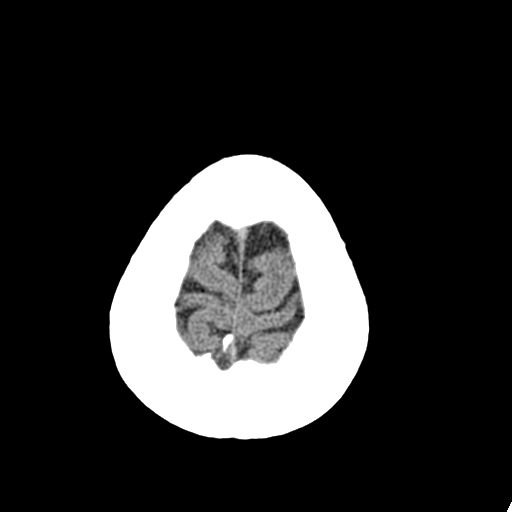
[im 28/32  brain]
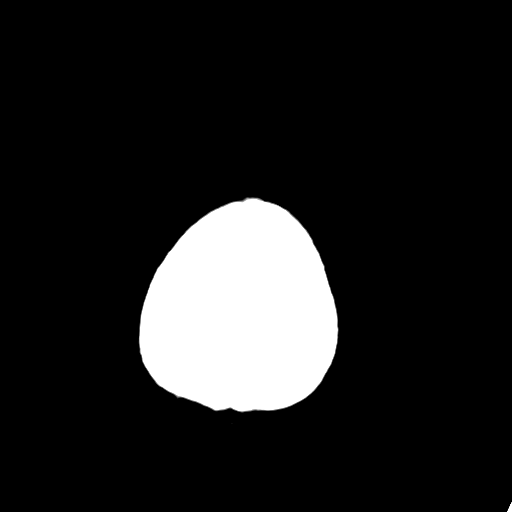
[im 30/32  brain]
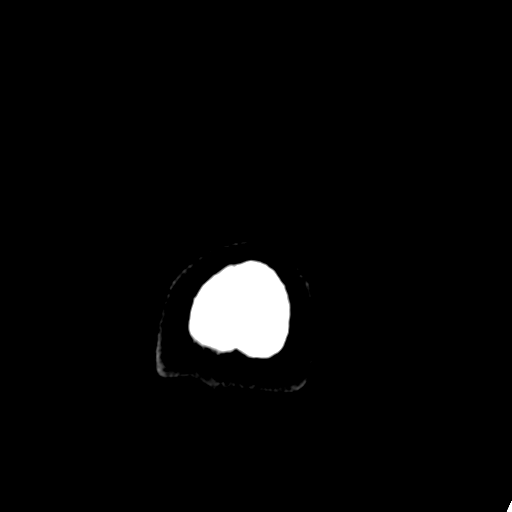

[16 of 30 positions shown; findings below may reference images not displayed]

FINDINGS: The brain has a normal appearance without evidence of
atrophy, old or acute infarction, mass lesion, hemorrhage,
hydrocephalus or extra-axial collection.  The calvarium is
unremarkable.  The sinuses, middle ears and mastoids are clear.
IMPRESSION: Normal examination

## 2009-11-03 ENCOUNTER — Encounter: Admission: RE | Admit: 2009-11-03 | Discharge: 2009-11-03 | Payer: Self-pay | Admitting: Internal Medicine

## 2009-11-03 IMAGING — US US ABDOMEN COMPLETE
1 series · 14 of 25 positions shown · non-contrast
Comparison: Renal ultrasound [DATE]

CLINICAL DATA: Abnormal liver function tests.  Hematuria.

COMPLETE ABDOMINAL ULTRASOUND

[Series 1: us abdomen complete · 0.29mm/px · 14 of 75 slices shown]
[im 1/75]
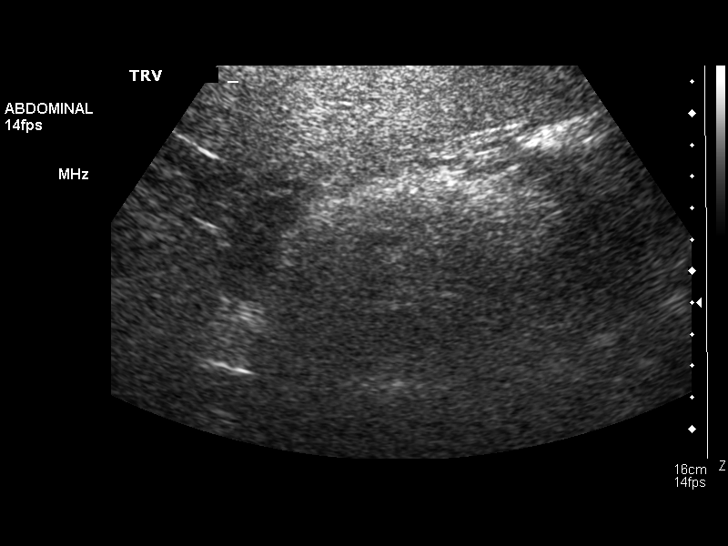
[im 7/75]
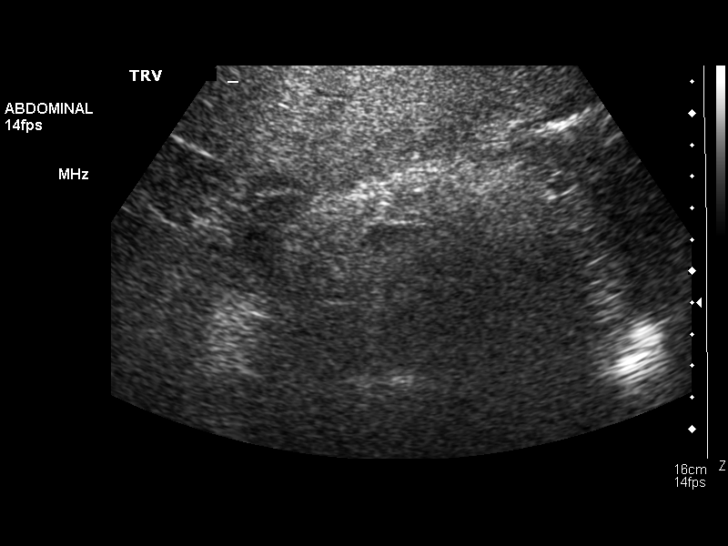
[im 13/75]
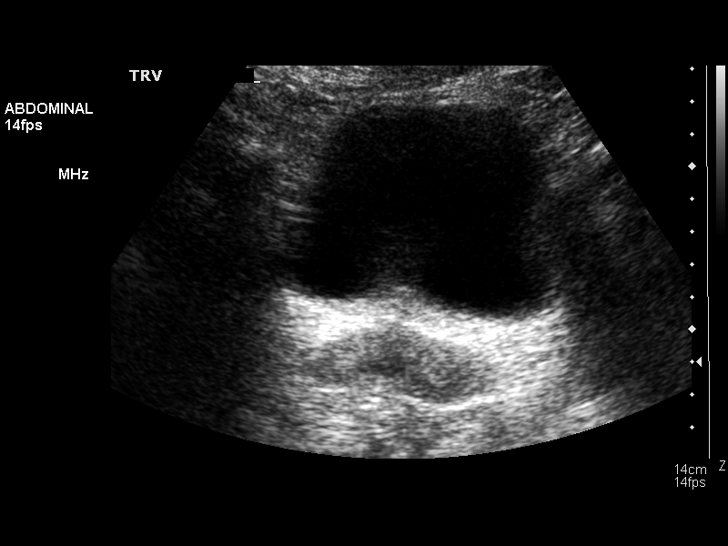
[im 19/75]
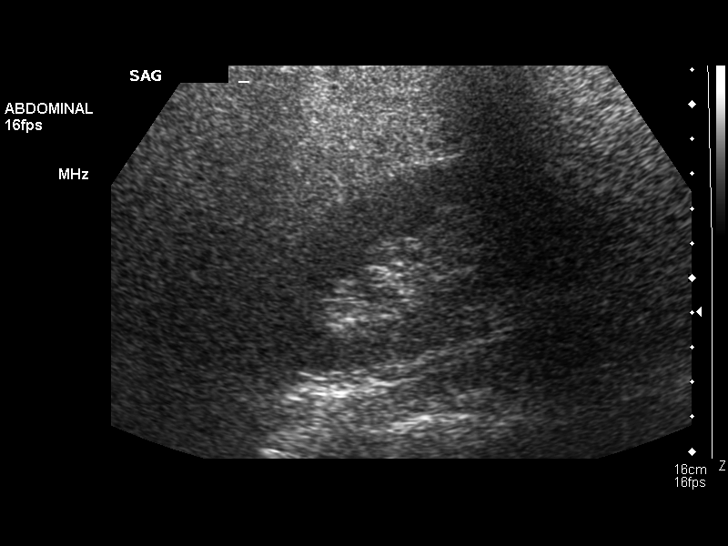
[im 25/75]
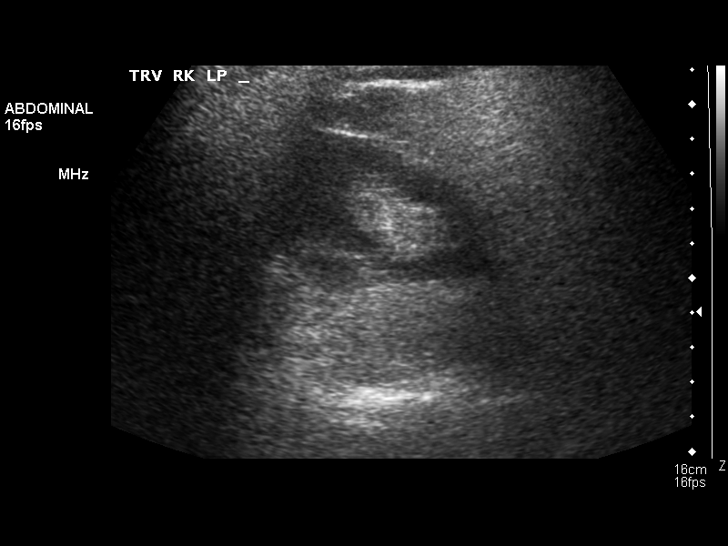
[im 28/75]
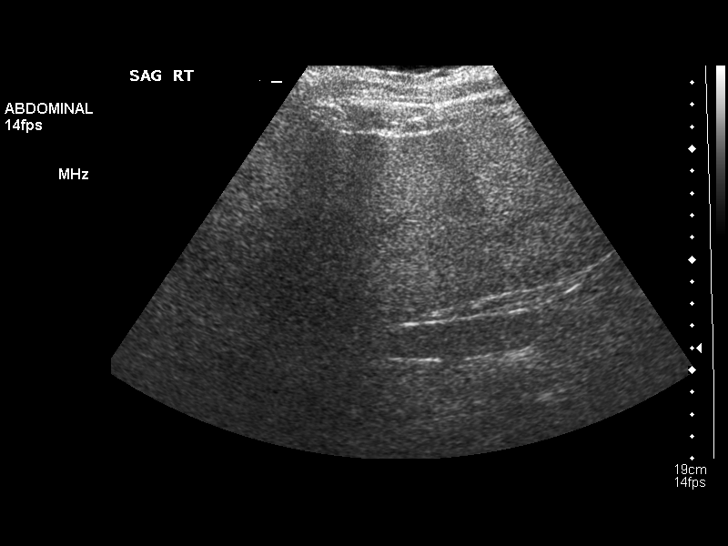
[im 34/75]
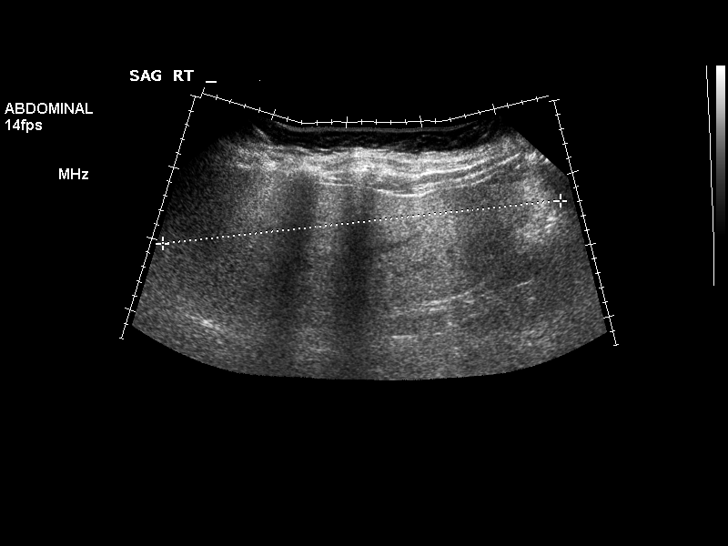
[im 41/75]
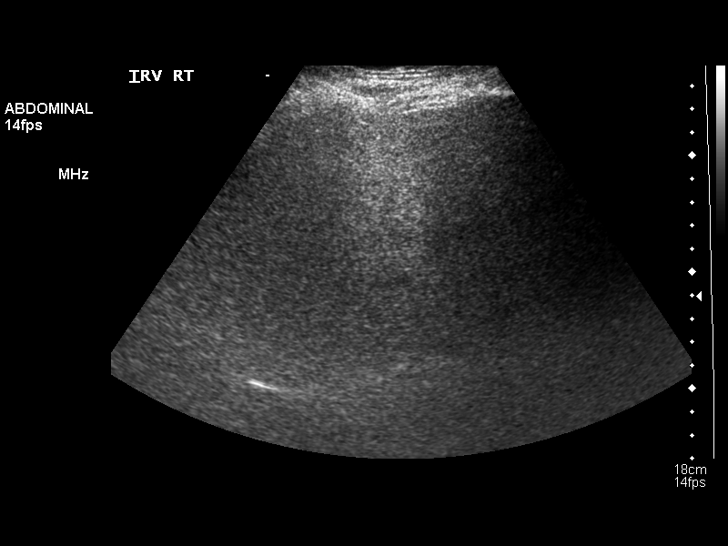
[im 47/75]
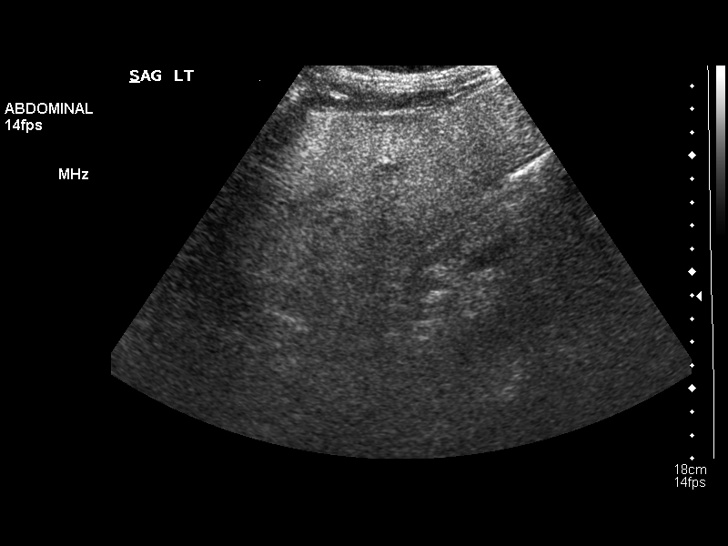
[im 50/75]
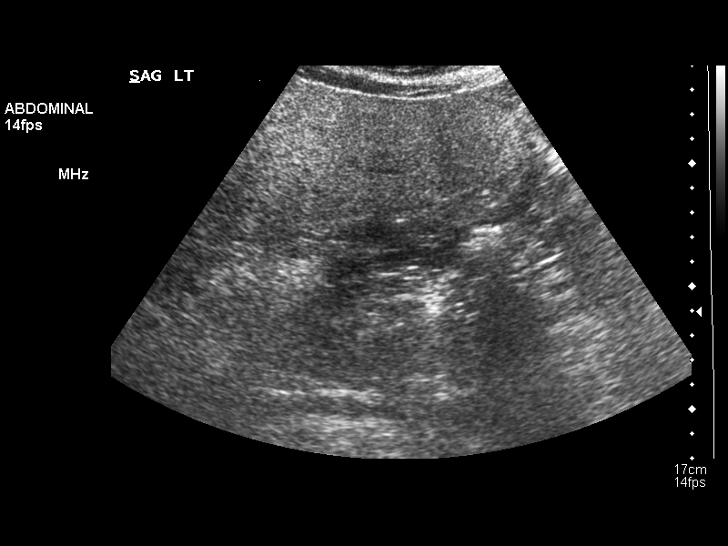
[im 56/75]
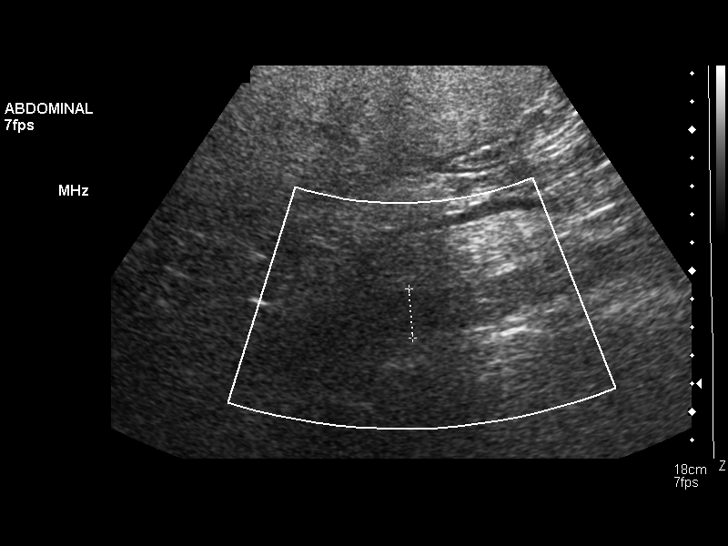
[im 62/75]
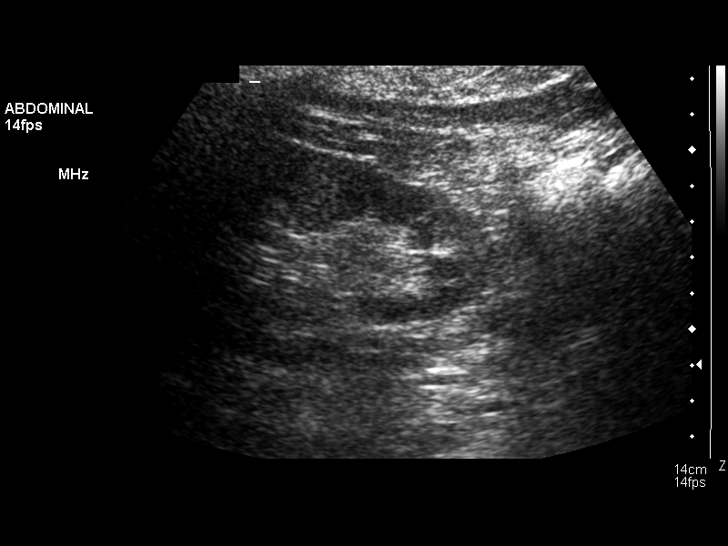
[im 68/75]
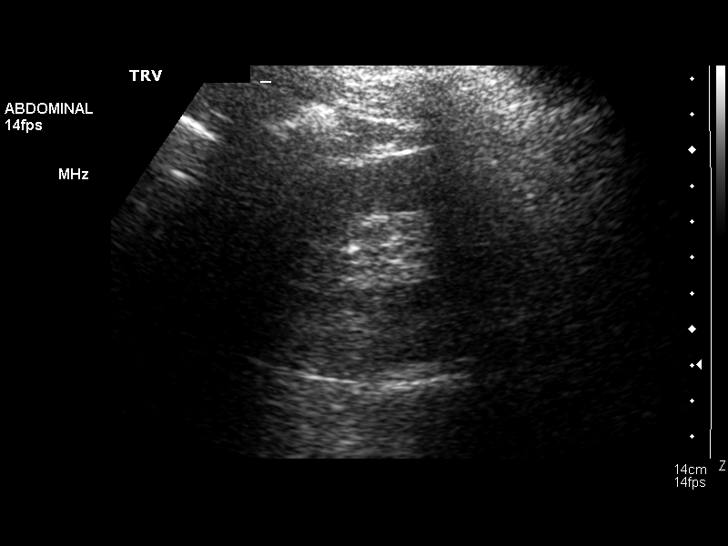
[im 75/75]
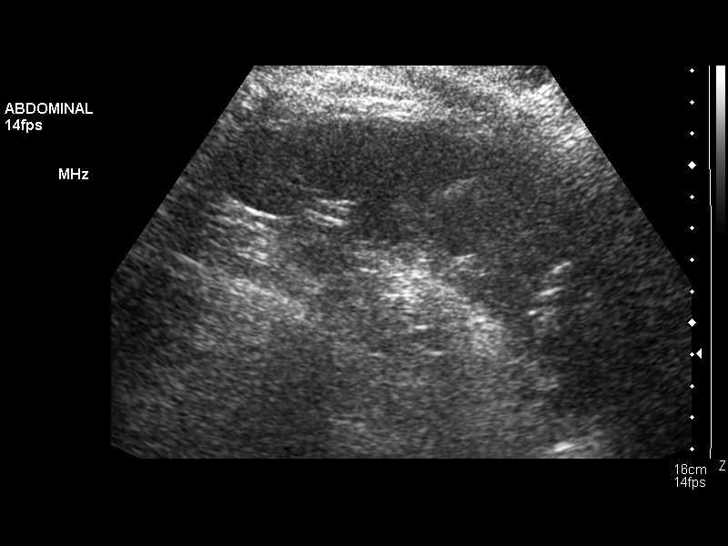

[14 of 25 positions shown; findings below may reference images not displayed]

FINDINGS: Gallbladder:  Surgically absent.

Common bile duct:  Measures 5 mm, within normal limits.

Liver:  Diffusely increased in echotexture.  Measures 26.9 cm.

IVC:  Not visualized due to bowel gas.

Pancreas:  Visualization is limited by bowel gas.

Spleen:  Measures 12.0 cm, negative.

Right Kidney:  Measures 11.7 cm, negative.

Left Kidney:  Measures 12.3 cm, negative.

Abdominal aorta:  No aneurysm identified.

Additional findings:  Bladder is unremarkable.  Ureteral jets are
not visualized.
IMPRESSION: Fatty enlarged liver.

## 2010-05-27 LAB — COMPREHENSIVE METABOLIC PANEL
ALT: 35 U/L (ref 0–35)
AST: 47 U/L — ABNORMAL HIGH (ref 0–37)
Albumin: 4.3 g/dL (ref 3.5–5.2)
Calcium: 10.3 mg/dL (ref 8.4–10.5)
Creatinine, Ser: 0.8 mg/dL (ref 0.4–1.2)
GFR calc Af Amer: >60 mL/min (ref >60)
Sodium: 144 mEq/L (ref 135–145)

## 2010-05-27 LAB — LIPID PANEL
Cholesterol: 232 mg/dL — ABNORMAL HIGH (ref 0–200)
HDL: 26 mg/dL — ABNORMAL LOW (ref >39)
Total CHOL/HDL Ratio: 8.9 RATIO
Triglycerides: 243 mg/dL — ABNORMAL HIGH (ref <150)

## 2010-05-27 LAB — CBC
MCHC: 33.2 g/dL (ref 30.0–36.0)
MCV: 90.7 fL (ref 78.0–100.0)
Platelets: 387 10*3/uL (ref 150–400)
WBC: 8.8 10*3/uL (ref 4.0–10.5)

## 2010-05-27 LAB — URINALYSIS, ROUTINE W REFLEX MICROSCOPIC
Bilirubin Urine: NEGATIVE
Ketones, ur: NEGATIVE mg/dL
Nitrite: NEGATIVE
Protein, ur: NEGATIVE mg/dL

## 2010-05-27 LAB — URINE CULTURE

## 2010-05-27 LAB — GLUCOSE, CAPILLARY: Glucose-Capillary: 126 mg/dL — ABNORMAL HIGH (ref 70–99)

## 2010-05-27 LAB — PROTIME-INR
INR: 1 (ref 0.00–1.49)
Prothrombin Time: 12.7 seconds (ref 11.6–15.2)

## 2010-05-27 LAB — DIFFERENTIAL
Basophils Absolute: 0.1 10*3/uL (ref 0.0–0.1)
Basophils Relative: 1 % (ref 0–1)
Eosinophils Absolute: 0.1 10*3/uL (ref 0.0–0.7)
Lymphocytes Relative: 24 % (ref 12–46)

## 2010-05-27 LAB — POCT CARDIAC MARKERS
CKMB, poc: <1 ng/mL — ABNORMAL LOW (ref 1.0–8.0)
CKMB, poc: <1 ng/mL — ABNORMAL LOW (ref 1.0–8.0)
Myoglobin, poc: 85.9 ng/mL (ref 12–200)
Myoglobin, poc: 87.9 ng/mL (ref 12–200)

## 2010-08-03 ENCOUNTER — Other Ambulatory Visit: Payer: Self-pay | Admitting: Family Medicine

## 2010-08-03 ENCOUNTER — Other Ambulatory Visit (HOSPITAL_COMMUNITY)
Admission: RE | Admit: 2010-08-03 | Discharge: 2010-08-03 | Disposition: A | Discharge status: Home / Self Care | Payer: BC Managed Care – PPO | Source: Ambulatory Visit | Attending: Family Medicine | Admitting: Family Medicine

## 2010-08-03 DIAGNOSIS — Z01419 Encounter for gynecological examination (general) (routine) without abnormal findings: Secondary | ICD-10-CM | POA: Insufficient documentation

## 2010-11-25 LAB — POCT I-STAT, CHEM 8
Chloride: 109 mEq/L (ref 96–112)
Glucose, Bld: 106 mg/dL — ABNORMAL HIGH (ref 70–99)
HCT: 42 % (ref 36.0–46.0)
Hemoglobin: 14.3 g/dL (ref 12.0–15.0)
Potassium: 3.8 mEq/L (ref 3.5–5.1)
Sodium: 144 mEq/L (ref 135–145)

## 2011-03-21 ENCOUNTER — Ambulatory Visit (INDEPENDENT_AMBULATORY_CARE_PROVIDER_SITE_OTHER): Payer: BC Managed Care – PPO | Admitting: Surgery

## 2011-03-29 ENCOUNTER — Encounter (INDEPENDENT_AMBULATORY_CARE_PROVIDER_SITE_OTHER): Payer: Self-pay | Admitting: Surgery

## 2011-03-29 ENCOUNTER — Ambulatory Visit (INDEPENDENT_AMBULATORY_CARE_PROVIDER_SITE_OTHER): Payer: BC Managed Care – PPO | Admitting: Surgery

## 2011-03-29 VITALS — BP 146/88 | HR 80 | Temp 97.0°F | Resp 20 | Ht 64.0 in | Wt 238.2 lb

## 2011-03-29 DIAGNOSIS — N6459 Other signs and symptoms in breast: Secondary | ICD-10-CM

## 2011-03-29 DIAGNOSIS — N6452 Nipple discharge: Secondary | ICD-10-CM | POA: Insufficient documentation

## 2011-03-29 NOTE — Patient Instructions (Signed)
We should see you here in about six months. The radiologist recommended a followup mammogram in July and I should see you after that. If he developed increasing nipple discharge or drainage let me see you sooner.

## 2011-03-29 NOTE — Progress Notes (Signed)
Addended byLiliana Cline on: 03/29/2011 10:55 AM   Modules accepted: Orders

## 2011-03-29 NOTE — Progress Notes (Signed)
  CC: Nipple discharge HPI: The patient had bilateral nipple discharge for about two or three months. The right side has been clear but intermittently the left side has looked bloody. It is not spontaneous and only comes in she squeezes were stimulated to nipple area. She had a mammogram and ultrasound which are negative. She has no prior history of breast problems. She has no family history of breast problems. She has no current symptoms other than the nipple discharge.   ROS: She has filled out the review of systems four-minute is basically negative.  MEDS: Current Outpatient Prescriptions  Medication Sig Dispense Refill  . metFORMIN (GLUCOPHAGE) 1000 MG tablet Take 1,000 mg by mouth daily with breakfast.      . triamterene-hydrochlorothiazide (MAXZIDE-25) 37.5-25 MG per tablet Take 1 tablet by mouth daily.         ALLERGIES: Allergies  Allergen Reactions  . Codeine     insomnia      PE General: The patient is alert oriented and healthy-appearing. Breasts: Her breasts are symmetric. They are not tender. There is no mass on either side. There is a tiny amount of clear nipple discharge from a duct at the 4:00 position on the right. This is fairly difficult to express. There is some clear discharge from the 1:00 position of the left breast. It is a little bit more easy to express. It is not bloody.  Data Reviewed Mammogram films and reports are reviewed  Assessment Bilateral nipple discharge with the left side being intermittently bloody; nonspontaneous.  Plan I discussed options with the patient. We could do a ductal excision. If the discharge increases ductogram might be useful. We could also just do a six month followup. I think this is low risk for breast cancer at this time. She would like to do a six month follow up with a mammogram followed by an exam by me.

## 2011-06-12 ENCOUNTER — Encounter (INDEPENDENT_AMBULATORY_CARE_PROVIDER_SITE_OTHER): Payer: Self-pay

## 2011-09-01 ENCOUNTER — Encounter (INDEPENDENT_AMBULATORY_CARE_PROVIDER_SITE_OTHER): Payer: Self-pay | Admitting: Surgery

## 2012-06-20 ENCOUNTER — Encounter (INDEPENDENT_AMBULATORY_CARE_PROVIDER_SITE_OTHER): Payer: Self-pay

## 2013-05-08 ENCOUNTER — Encounter: Payer: Self-pay | Admitting: Obstetrics

## 2013-05-30 ENCOUNTER — Other Ambulatory Visit: Payer: Self-pay | Admitting: Obstetrics & Gynecology

## 2013-06-16 ENCOUNTER — Ambulatory Visit: Payer: Self-pay | Admitting: Obstetrics & Gynecology

## 2013-06-25 ENCOUNTER — Ambulatory Visit: Payer: BC Managed Care – PPO | Admitting: Family Medicine

## 2013-06-25 DIAGNOSIS — Z0289 Encounter for other administrative examinations: Secondary | ICD-10-CM

## 2014-02-16 ENCOUNTER — Encounter: Payer: Self-pay | Admitting: *Deleted

## 2014-02-17 ENCOUNTER — Encounter: Payer: Self-pay | Admitting: Obstetrics & Gynecology

## 2014-03-04 ENCOUNTER — Other Ambulatory Visit: Payer: Self-pay | Admitting: Obstetrics & Gynecology

## 2014-03-04 ENCOUNTER — Other Ambulatory Visit (HOSPITAL_COMMUNITY)
Admission: RE | Admit: 2014-03-04 | Discharge: 2014-03-04 | Disposition: A | Discharge status: Home / Self Care | Payer: BC Managed Care – PPO | Source: Ambulatory Visit | Attending: Obstetrics & Gynecology | Admitting: Obstetrics & Gynecology

## 2014-03-04 DIAGNOSIS — Z1151 Encounter for screening for human papillomavirus (HPV): Secondary | ICD-10-CM | POA: Diagnosis present

## 2014-03-04 DIAGNOSIS — Z01411 Encounter for gynecological examination (general) (routine) with abnormal findings: Secondary | ICD-10-CM | POA: Insufficient documentation

## 2014-03-04 LAB — RESULTS CONSOLE HPV: CHL HPV: NEGATIVE

## 2014-03-04 LAB — HM PAP SMEAR: HM Pap smear: NEGATIVE

## 2014-03-11 LAB — CYTOLOGY - PAP

## 2014-04-02 ENCOUNTER — Other Ambulatory Visit (INDEPENDENT_AMBULATORY_CARE_PROVIDER_SITE_OTHER): Payer: Self-pay | Admitting: General Surgery

## 2014-04-02 ENCOUNTER — Encounter (INDEPENDENT_AMBULATORY_CARE_PROVIDER_SITE_OTHER): Payer: Self-pay | Admitting: General Surgery

## 2014-04-02 NOTE — Progress Notes (Signed)
Patient ID: Monica Clark, female   DOB: [birth year], 59 y.o.   MRN: [id number]  Monica Clark 04/02/2014 3:15 PM Location: [facility] Patient #: [id number] DOB: 1955-06-10 Single / Language: Cleophus Molt / Race: Black or African American Female History of Present Illness Monica Hollingshead MD; 04/02/2014 5:18 PM) Patient words: breast follow up.  The patient is a 59 year old female    Note:She is referred by Dr. Isaiah Blakes because of left nipple discharge and an abnormal ductogram. She's been having intermittent, yellowish left nipple discharge for approximately 3 years. She denies any bloody discharge. Investigation has included a mammogram and a ductogram. The ductogram demonstrated an area of narrowing/mass beneath the nipple at the 12 o'clock position. 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 She is referred here for further evaluation and treatment. No family or personal history of breast cancer.  Other Problems Elbert Ewings, CMA; 04/02/2014 3:15 PM) Diabetes Mellitus High blood pressure  Past Surgical History Elbert Ewings, CMA; 04/02/2014 3:15 PM) Cesarean Section - 1 Gallbladder Surgery - Open  Diagnostic Studies History Elbert Ewings, CMA; 04/02/2014 3:15 PM) Colonoscopy never Mammogram within last year  Allergies Elbert Ewings, CMA; 04/02/2014 3:18 PM) Codeine Phosphate *ANALGESICS - OPIOID*  Medication History Elbert Ewings, CMA; 04/02/2014 3:18 PM) MetFORMIN HCl (1000MG  Tablet, Oral) Active. Triamterene-HCTZ (37.5-25MG  Tablet, Oral) Active. Pravastatin Sodium (40MG  Tablet, Oral) Active.  Social History Elbert Ewings, [place]; 04/02/2014 3:15 PM) Caffeine use Coffee. No drug use Tobacco use Never smoker.  Family History Elbert Ewings, [place]; 04/02/2014 3:15 PM) Alcohol Abuse Father. Diabetes Mellitus Brother, Mother, Sister.  Pregnancy / Birth History Elbert Ewings, CMA; 04/02/2014 3:15 PM) Age at menarche 81 years. Gravida 3 Maternal age  51-25 Para 3     Review of Systems Elbert Ewings CMA; 04/02/2014 3:15 PM) General Present- Fatigue and Night Sweats. Not Present- Appetite Loss, Chills, Fever, Weight Gain and Weight Loss. Skin Present- Dryness. Not Present- Change in Wart/Mole, Hives, Jaundice, New Lesions, Non-Healing Wounds, Rash and Ulcer. HEENT Present- Visual Disturbances and Wears glasses/contact lenses. Not Present- Earache, Hearing Loss, Hoarseness, Nose Bleed, Oral Ulcers, Ringing in the Ears, Seasonal Allergies, Sinus Pain, Sore Throat and Yellow Eyes. Breast Present- Nipple Discharge. Not Present- Breast Mass, Breast Pain and Skin Changes. Cardiovascular Present- Swelling of Extremities. Not Present- Chest Pain, Difficulty Breathing Lying Down, Leg Cramps, Palpitations, Rapid Heart Rate and Shortness of Breath. Gastrointestinal Not Present- Abdominal Pain, Bloating, Bloody Stool, Change in Bowel Habits, Chronic diarrhea, Constipation, Difficulty Swallowing, Excessive gas, Gets full quickly at meals, Hemorrhoids, Indigestion, Nausea, Rectal Pain and Vomiting. Musculoskeletal Present- Joint Stiffness. Not Present- Back Pain, Joint Pain, Muscle Pain, Muscle Weakness and Swelling of Extremities. Neurological Present- Numbness. Not Present- Decreased Memory, Fainting, Headaches, Seizures, Tingling, Tremor, Trouble walking and Weakness. Endocrine Present- Hot flashes. Not Present- Cold Intolerance, Excessive Hunger, Hair Changes, Heat Intolerance and New Diabetes. Hematology Not Present- Easy Bruising, Excessive bleeding, Gland problems, HIV and Persistent Infections.  Vitals Elbert Ewings CMA; 04/02/2014 3:19 PM) 04/02/2014 3:18 PM Weight: 229 lb Height: 63in Body Surface Area: 2.15 m Body Mass Index: 40.57 kg/m Temp.: 96.57F  Pulse: 80 (Regular)  BP: 130/70 (Sitting, Left Arm, Standard)     Physical Exam Monica Hollingshead MD; 04/02/2014 5:20 PM)  The physical exam findings are as  follows: Note:General: Overweight female in NAD. Pleasant and cooperative.  CV: RRR, no murmur, no JVD.  CHEST: Breath sounds equal and clear. Respirations nonlabored.  BREASTS: Symmetrical in size. No dominant masses, left nipple  discharge is present at the 12:00 position. The discharge is straw colored.  LYMPHATIC: No palpable cervical, supraclavicular, axillary adenopathy.  NEUROLOGIC: Alert and oriented, answers questions appropriately, normal gait and station.  PSYCHIATRIC: Normal mood, affect , and behavior.    Assessment & Plan Monica Hollingshead MD; 04/02/2014 5:21 PM)  DISCHARGE FROM LEFT NIPPLE (611.79  N64.52) Impression: Ductogram demonstrates an area of abnormality suspicious for a mass. 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 I explained to her the 10-15% these could harbor precancerous lesions or potentially cancer. I recommended she have left nipple duct excision and left breast biopsy. She is in agreement with this.  Plan: Left breast duct excision and breast biopsy. The procedure and risks were discussed with her. Risks include but are not limited to bleeding, infection, wound healing problems, anesthesia, persistent nipple discharge, numbness, chronic pain.  Current Plans Schedule for Surgery  Jackolyn Confer, MD

## 2014-04-21 DIAGNOSIS — N6452 Nipple discharge: Secondary | ICD-10-CM

## 2014-04-21 HISTORY — DX: Nipple discharge: N64.52

## 2014-04-27 ENCOUNTER — Encounter (HOSPITAL_BASED_OUTPATIENT_CLINIC_OR_DEPARTMENT_OTHER): Payer: Self-pay | Admitting: *Deleted

## 2014-04-27 NOTE — Progress Notes (Signed)
   04/27/14 1425  OBSTRUCTIVE SLEEP APNEA  Have you ever been diagnosed with sleep apnea through a sleep study? No  Do you snore loudly (loud enough to be heard through closed doors)?  0  Do you often feel tired, fatigued, or sleepy during the daytime? 1  Has anyone observed you stop breathing during your sleep? 0  Do you have, or are you being treated for high blood pressure? 1  BMI more than 35 kg/m2? 1  Age over 57 years old? 1  Gender: 0  Obstructive Sleep Apnea Score 4

## 2014-04-27 NOTE — Pre-Procedure Instructions (Signed)
To come for CMET, PT/INR, CBC/diff, EKG

## 2014-04-29 ENCOUNTER — Encounter (HOSPITAL_BASED_OUTPATIENT_CLINIC_OR_DEPARTMENT_OTHER)
Admission: RE | Admit: 2014-04-29 | Discharge: 2014-04-29 | Disposition: A | Discharge status: Home / Self Care | Payer: BC Managed Care – PPO | Source: Ambulatory Visit | Attending: General Surgery | Admitting: General Surgery

## 2014-04-29 DIAGNOSIS — E669 Obesity, unspecified: Secondary | ICD-10-CM | POA: Diagnosis not present

## 2014-04-29 DIAGNOSIS — G4733 Obstructive sleep apnea (adult) (pediatric): Secondary | ICD-10-CM | POA: Diagnosis not present

## 2014-04-29 DIAGNOSIS — N6092 Unspecified benign mammary dysplasia of left breast: Secondary | ICD-10-CM | POA: Diagnosis not present

## 2014-04-29 DIAGNOSIS — N6012 Diffuse cystic mastopathy of left breast: Secondary | ICD-10-CM | POA: Diagnosis not present

## 2014-04-29 DIAGNOSIS — N6452 Nipple discharge: Secondary | ICD-10-CM | POA: Diagnosis not present

## 2014-04-29 DIAGNOSIS — Z886 Allergy status to analgesic agent status: Secondary | ICD-10-CM | POA: Diagnosis not present

## 2014-04-29 DIAGNOSIS — E119 Type 2 diabetes mellitus without complications: Secondary | ICD-10-CM | POA: Diagnosis not present

## 2014-04-29 DIAGNOSIS — Z6839 Body mass index (BMI) 39.0-39.9, adult: Secondary | ICD-10-CM | POA: Diagnosis not present

## 2014-04-29 DIAGNOSIS — D242 Benign neoplasm of left breast: Secondary | ICD-10-CM | POA: Diagnosis not present

## 2014-04-29 DIAGNOSIS — F1590 Other stimulant use, unspecified, uncomplicated: Secondary | ICD-10-CM | POA: Diagnosis not present

## 2014-04-29 LAB — CBC WITH DIFFERENTIAL/PLATELET
BASOS ABS: 0 10*3/uL (ref 0.0–0.1)
Basophils Relative: 0 % (ref 0–1)
EOS ABS: 0.2 10*3/uL (ref 0.0–0.7)
Eosinophils Relative: 2 % (ref 0–5)
HEMATOCRIT: 40.8 % (ref 36.0–46.0)
Hemoglobin: 13.6 g/dL (ref 12.0–15.0)
LYMPHS ABS: 3.2 10*3/uL (ref 0.7–4.0)
LYMPHS PCT: 30 % (ref 12–46)
MCH: 29.8 pg (ref 26.0–34.0)
MCHC: 33.3 g/dL (ref 30.0–36.0)
MCV: 89.5 fL (ref 78.0–100.0)
MONOS PCT: 4 % (ref 3–12)
Monocytes Absolute: 0.4 10*3/uL (ref 0.1–1.0)
NEUTROS PCT: 64 % (ref 43–77)
Neutro Abs: 6.9 10*3/uL (ref 1.7–7.7)
Platelets: 359 10*3/uL (ref 150–400)
RBC: 4.56 MIL/uL (ref 3.87–5.11)
RDW: 13.9 % (ref 11.5–15.5)
WBC: 10.7 10*3/uL — AB (ref 4.0–10.5)

## 2014-04-29 LAB — COMPREHENSIVE METABOLIC PANEL
ALBUMIN: 3.9 g/dL (ref 3.5–5.2)
ALT: 33 U/L (ref 0–35)
AST: 58 U/L — AB (ref 0–37)
Alkaline Phosphatase: 93 U/L (ref 39–117)
Anion gap: 10 (ref 5–15)
BUN: 14 mg/dL (ref 6–23)
CALCIUM: 9.5 mg/dL (ref 8.4–10.5)
CHLORIDE: 100 mmol/L (ref 96–112)
CO2: 25 mmol/L (ref 19–32)
Creatinine, Ser: 0.94 mg/dL (ref 0.50–1.10)
GFR calc non Af Amer: 66 mL/min — ABNORMAL LOW (ref >90)
GFR, EST AFRICAN AMERICAN: 77 mL/min — AB (ref >90)
Glucose, Bld: 273 mg/dL — ABNORMAL HIGH (ref 70–99)
POTASSIUM: 4.2 mmol/L (ref 3.5–5.1)
SODIUM: 135 mmol/L (ref 135–145)
TOTAL PROTEIN: 8.4 g/dL — AB (ref 6.0–8.3)
Total Bilirubin: 0.4 mg/dL (ref 0.3–1.2)

## 2014-04-29 LAB — PROTIME-INR
INR: 0.97 (ref 0.00–1.49)
Prothrombin Time: 13 seconds (ref 11.6–15.2)

## 2014-04-30 ENCOUNTER — Encounter (HOSPITAL_BASED_OUTPATIENT_CLINIC_OR_DEPARTMENT_OTHER): Payer: Self-pay

## 2014-04-30 ENCOUNTER — Ambulatory Visit (HOSPITAL_BASED_OUTPATIENT_CLINIC_OR_DEPARTMENT_OTHER): Payer: BC Managed Care – PPO | Admitting: Anesthesiology

## 2014-04-30 ENCOUNTER — Encounter (HOSPITAL_BASED_OUTPATIENT_CLINIC_OR_DEPARTMENT_OTHER)
Admission: RE | Disposition: A | Discharge status: Home / Self Care | Payer: Self-pay | Source: Ambulatory Visit | Attending: General Surgery

## 2014-04-30 ENCOUNTER — Ambulatory Visit (HOSPITAL_BASED_OUTPATIENT_CLINIC_OR_DEPARTMENT_OTHER)
Admission: RE | Admit: 2014-04-30 | Discharge: 2014-04-30 | Disposition: A | Discharge status: Home / Self Care | Payer: BC Managed Care – PPO | Source: Ambulatory Visit | Attending: General Surgery | Admitting: General Surgery

## 2014-04-30 DIAGNOSIS — E669 Obesity, unspecified: Secondary | ICD-10-CM | POA: Insufficient documentation

## 2014-04-30 DIAGNOSIS — D242 Benign neoplasm of left breast: Secondary | ICD-10-CM | POA: Insufficient documentation

## 2014-04-30 DIAGNOSIS — Z6839 Body mass index (BMI) 39.0-39.9, adult: Secondary | ICD-10-CM | POA: Insufficient documentation

## 2014-04-30 DIAGNOSIS — Z886 Allergy status to analgesic agent status: Secondary | ICD-10-CM | POA: Insufficient documentation

## 2014-04-30 DIAGNOSIS — G4733 Obstructive sleep apnea (adult) (pediatric): Secondary | ICD-10-CM | POA: Insufficient documentation

## 2014-04-30 DIAGNOSIS — F1590 Other stimulant use, unspecified, uncomplicated: Secondary | ICD-10-CM | POA: Insufficient documentation

## 2014-04-30 DIAGNOSIS — N6452 Nipple discharge: Secondary | ICD-10-CM | POA: Insufficient documentation

## 2014-04-30 DIAGNOSIS — N6092 Unspecified benign mammary dysplasia of left breast: Secondary | ICD-10-CM | POA: Insufficient documentation

## 2014-04-30 DIAGNOSIS — E119 Type 2 diabetes mellitus without complications: Secondary | ICD-10-CM | POA: Insufficient documentation

## 2014-04-30 DIAGNOSIS — N6012 Diffuse cystic mastopathy of left breast: Secondary | ICD-10-CM | POA: Insufficient documentation

## 2014-04-30 HISTORY — DX: Cardiac murmur, unspecified: R01.1

## 2014-04-30 HISTORY — DX: Endometrial hyperplasia, unspecified: N85.00

## 2014-04-30 HISTORY — DX: Type 2 diabetes mellitus without complications: E11.9

## 2014-04-30 HISTORY — PX: BREAST DUCTAL SYSTEM EXCISION: SHX5242

## 2014-04-30 HISTORY — DX: Unspecified lump in the left breast, unspecified quadrant: N63.20

## 2014-04-30 HISTORY — DX: Unspecified cataract: H26.9

## 2014-04-30 HISTORY — PX: BREAST BIOPSY: SHX20

## 2014-04-30 LAB — GLUCOSE, CAPILLARY
GLUCOSE-CAPILLARY: 224 mg/dL — AB (ref 70–99)
Glucose-Capillary: 261 mg/dL — ABNORMAL HIGH (ref 70–99)

## 2014-04-30 SURGERY — EXCISION DUCTAL SYSTEM BREAST
Anesthesia: General | Site: Breast | Laterality: Left

## 2014-04-30 MED ORDER — ONDANSETRON HCL 4 MG PO TABS: 4.0000 mg | ORAL_TABLET | Freq: Four times a day (QID) | ORAL | Status: DC | PRN

## 2014-04-30 MED ORDER — LACTATED RINGERS IV SOLN
INTRAVENOUS | Status: DC
  Administered 2014-04-30 (×2): via INTRAVENOUS

## 2014-04-30 MED ORDER — MIDAZOLAM HCL 2 MG/ML PO SYRP: 12.0000 mg | ORAL_SOLUTION | Freq: Once | ORAL | Status: DC | PRN

## 2014-04-30 MED ORDER — CEFAZOLIN SODIUM-DEXTROSE 2-3 GM-% IV SOLR
INTRAVENOUS | Status: AC
  Filled 2014-04-30: qty 50

## 2014-04-30 MED ORDER — CEFAZOLIN SODIUM-DEXTROSE 2-3 GM-% IV SOLR
2.0000 g | INTRAVENOUS | Status: AC
  Administered 2014-04-30: 2 g via INTRAVENOUS

## 2014-04-30 MED ORDER — FENTANYL CITRATE 0.05 MG/ML IJ SOLN
INTRAMUSCULAR | Status: AC
  Filled 2014-04-30: qty 6

## 2014-04-30 MED ORDER — BUPIVACAINE HCL (PF) 0.5 % IJ SOLN
INTRAMUSCULAR | Status: DC | PRN
  Administered 2014-04-30: 14 mL

## 2014-04-30 MED ORDER — SODIUM BICARBONATE 4 % IV SOLN
INTRAVENOUS | Status: AC
  Filled 2014-04-30: qty 5

## 2014-04-30 MED ORDER — SCOPOLAMINE 1 MG/3DAYS TD PT72
1.0000 | MEDICATED_PATCH | TRANSDERMAL | Status: DC
  Administered 2014-04-30: 1.5 mg via TRANSDERMAL

## 2014-04-30 MED ORDER — SUCCINYLCHOLINE CHLORIDE 20 MG/ML IJ SOLN
INTRAMUSCULAR | Status: AC
  Filled 2014-04-30: qty 1

## 2014-04-30 MED ORDER — HYDROCODONE-ACETAMINOPHEN 5-325 MG PO TABS: 1.0000 | ORAL_TABLET | ORAL | Status: DC | PRN

## 2014-04-30 MED ORDER — PROPOFOL 10 MG/ML IV EMUL
INTRAVENOUS | Status: AC
  Filled 2014-04-30: qty 100

## 2014-04-30 MED ORDER — PROPOFOL 10 MG/ML IV BOLUS
INTRAVENOUS | Status: AC
  Filled 2014-04-30: qty 40

## 2014-04-30 MED ORDER — BUPIVACAINE HCL (PF) 0.5 % IJ SOLN
INTRAMUSCULAR | Status: AC
  Filled 2014-04-30: qty 30

## 2014-04-30 MED ORDER — PROPOFOL 10 MG/ML IV BOLUS
INTRAVENOUS | Status: DC | PRN
  Administered 2014-04-30: 300 mg via INTRAVENOUS

## 2014-04-30 MED ORDER — ONDANSETRON HCL 4 MG/2ML IJ SOLN
INTRAMUSCULAR | Status: DC | PRN
  Administered 2014-04-30: 4 mg via INTRAVENOUS

## 2014-04-30 MED ORDER — MIDAZOLAM HCL 2 MG/2ML IJ SOLN: 1.0000 mg | INTRAMUSCULAR | Status: DC | PRN

## 2014-04-30 MED ORDER — LIDOCAINE HCL (CARDIAC) 20 MG/ML IV SOLN
INTRAVENOUS | Status: DC | PRN
  Administered 2014-04-30: 50 mg via INTRAVENOUS

## 2014-04-30 MED ORDER — FENTANYL CITRATE 0.05 MG/ML IJ SOLN: 50.0000 ug | INTRAMUSCULAR | Status: DC | PRN

## 2014-04-30 MED ORDER — ROCURONIUM BROMIDE 50 MG/5ML IV SOLN
INTRAVENOUS | Status: AC
  Filled 2014-04-30: qty 1

## 2014-04-30 MED ORDER — MIDAZOLAM HCL 5 MG/5ML IJ SOLN
INTRAMUSCULAR | Status: DC | PRN
  Administered 2014-04-30: 2 mg via INTRAVENOUS

## 2014-04-30 MED ORDER — BUPIVACAINE-EPINEPHRINE (PF) 0.5% -1:200000 IJ SOLN
INTRAMUSCULAR | Status: AC
Start: 2014-04-30 — End: 2014-04-30
  Filled 2014-04-30: qty 30

## 2014-04-30 MED ORDER — MIDAZOLAM HCL 2 MG/2ML IJ SOLN
INTRAMUSCULAR | Status: AC
  Filled 2014-04-30: qty 2

## 2014-04-30 MED ORDER — FENTANYL CITRATE 0.05 MG/ML IJ SOLN
INTRAMUSCULAR | Status: DC | PRN
  Administered 2014-04-30: 100 ug via INTRAVENOUS

## 2014-04-30 MED ORDER — LIDOCAINE HCL (PF) 1 % IJ SOLN
INTRAMUSCULAR | Status: AC
  Filled 2014-04-30: qty 30

## 2014-04-30 SURGICAL SUPPLY — 46 items
APL SKNCLS STERI-STRIP NONHPOA (GAUZE/BANDAGES/DRESSINGS) ×1
BENZOIN TINCTURE PRP APPL 2/3 (GAUZE/BANDAGES/DRESSINGS) ×3 IMPLANT
BINDER BREAST XLRG (GAUZE/BANDAGES/DRESSINGS) ×3 IMPLANT
BLADE SURG 15 STRL LF DISP TIS (BLADE) ×3 IMPLANT
BLADE SURG 15 STRL SS (BLADE) ×9
CANISTER SUCT 1200ML W/VALVE (MISCELLANEOUS) IMPLANT
CHLORAPREP W/TINT 26ML (MISCELLANEOUS) ×3 IMPLANT
CLOSURE WOUND 1/2 X4 (GAUZE/BANDAGES/DRESSINGS) ×1
COVER BACK TABLE 60X90IN (DRAPES) ×3 IMPLANT
COVER MAYO STAND STRL (DRAPES) ×3 IMPLANT
DECANTER SPIKE VIAL GLASS SM (MISCELLANEOUS) IMPLANT
DEVICE DUBIN W/COMP PLATE 8390 (MISCELLANEOUS) IMPLANT
DRAPE LAPAROTOMY 100X72 PEDS (DRAPES) ×3 IMPLANT
DRAPE UTILITY XL STRL (DRAPES) ×3 IMPLANT
ELECT COATED BLADE 2.86 ST (ELECTRODE) ×3 IMPLANT
ELECT REM PT RETURN 9FT ADLT (ELECTROSURGICAL) ×3
ELECTRODE REM PT RTRN 9FT ADLT (ELECTROSURGICAL) ×1 IMPLANT
GAUZE SPONGE 4X4 12PLY STRL (GAUZE/BANDAGES/DRESSINGS) ×3 IMPLANT
GLOVE BIOGEL PI IND STRL 6.5 (GLOVE) ×1 IMPLANT
GLOVE BIOGEL PI IND STRL 7.5 (GLOVE) ×1 IMPLANT
GLOVE BIOGEL PI IND STRL 8.5 (GLOVE) ×2 IMPLANT
GLOVE BIOGEL PI INDICATOR 6.5 (GLOVE) ×2
GLOVE BIOGEL PI INDICATOR 7.5 (GLOVE) ×2
GLOVE BIOGEL PI INDICATOR 8.5 (GLOVE) ×4
GLOVE ECLIPSE 6.5 STRL STRAW (GLOVE) ×3 IMPLANT
GLOVE ECLIPSE 8.0 STRL XLNG CF (GLOVE) ×6 IMPLANT
GOWN STRL REUS W/ TWL LRG LVL3 (GOWN DISPOSABLE) ×2 IMPLANT
GOWN STRL REUS W/ TWL XL LVL3 (GOWN DISPOSABLE) ×1 IMPLANT
GOWN STRL REUS W/TWL LRG LVL3 (GOWN DISPOSABLE) ×6
GOWN STRL REUS W/TWL XL LVL3 (GOWN DISPOSABLE) ×3
NEEDLE HYPO 25X1 1.5 SAFETY (NEEDLE) ×3 IMPLANT
NS IRRIG 1000ML POUR BTL (IV SOLUTION) ×3 IMPLANT
PACK BASIN DAY SURGERY FS (CUSTOM PROCEDURE TRAY) ×3 IMPLANT
PENCIL BUTTON HOLSTER BLD 10FT (ELECTRODE) ×3 IMPLANT
SLEEVE SCD COMPRESS KNEE MED (MISCELLANEOUS) ×3 IMPLANT
SPONGE GAUZE 4X4 12PLY STER LF (GAUZE/BANDAGES/DRESSINGS) ×3 IMPLANT
STRIP CLOSURE SKIN 1/2X4 (GAUZE/BANDAGES/DRESSINGS) ×2 IMPLANT
SUT MON AB 4-0 PC3 18 (SUTURE) ×6 IMPLANT
SUT SILK 2 0 FS (SUTURE) ×3 IMPLANT
SUT VICRYL 3-0 CR8 SH (SUTURE) ×3 IMPLANT
SYR CONTROL 10ML LL (SYRINGE) ×3 IMPLANT
TOWEL OR 17X24 6PK STRL BLUE (TOWEL DISPOSABLE) IMPLANT
TOWEL OR NON WOVEN STRL DISP B (DISPOSABLE) ×3 IMPLANT
TUBE CONNECTING 20'X1/4 (TUBING)
TUBE CONNECTING 20X1/4 (TUBING) IMPLANT
YANKAUER SUCT BULB TIP NO VENT (SUCTIONS) IMPLANT

## 2014-04-30 NOTE — Anesthesia Preprocedure Evaluation (Signed)
Anesthesia Evaluation  Patient identified by MRN, date of birth, ID band Patient awake    Reviewed: Allergy & Precautions, NPO status , Patient's Chart, lab work & pertinent test results  Airway Mallampati: I  TM Distance: >3 FB     Dental  (+) Teeth Intact, Dental Advisory Given   Pulmonary  breath sounds clear to auscultation        Cardiovascular hypertension, Pt. on medications Rhythm:Regular Rate:Normal     Neuro/Psych    GI/Hepatic   Endo/Other  diabetes, Well Controlled, Type 2, Oral Hypoglycemic AgentsMorbid obesity  Renal/GU      Musculoskeletal   Abdominal   Peds  Hematology   Anesthesia Other Findings   Reproductive/Obstetrics                             Anesthesia Physical Anesthesia Plan  ASA: III  Anesthesia Plan: General   Post-op Pain Management:    Induction: Intravenous  Airway Management Planned: LMA  Additional Equipment:   Intra-op Plan:   Post-operative Plan: Extubation in OR  Informed Consent: I have reviewed the patients History and Physical, chart, labs and discussed the procedure including the risks, benefits and alternatives for the proposed anesthesia with the patient or authorized representative who has indicated his/her understanding and acceptance.   Dental advisory given  Plan Discussed with: CRNA and Anesthesiologist  Anesthesia Plan Comments:         Anesthesia Quick Evaluation

## 2014-04-30 NOTE — Op Note (Signed)
Operative Note  Monica Clark female 59 y.o. 04/30/2014  PREOPERATIVE DX:  Left nipple discharge and ductal filling defect on ductogram  POSTOPERATIVE DX:  Same  PROCEDURE:   Excision of left nipple ducts and left breast biopsy         Surgeon: Odis Hollingshead   Assistants: Gwenith Spitz, PA-S  Anesthesia: General LMA anesthesia  Indications:   This is a 59 year old female with left nipple discharge and a filling defect at the 12:00 position close to the nipple on ductogram.  She presents for the above procedure.    Procedure Detail:  She was seen in the holding area and the left breast marked with my initials.  She was brought to the OR, placed supine on the table and given general anesthesia with an LMA.  The left breast was sterilely prepped and draped.  The left nipple discharge was elicited in the 16:10 position and a small probe placed into the duct.  It passed into the duct for a distance of 5 cm without resistance.  Local anesthetic (Marcaine) was infiltrated in the circumareolar area from the 9:00 to 3:00 area in a clockwise fashion.  A circumareolar incision was made in that area down to the subcutaneous tissue.  Using sharp dissection, the duct was identified and removed.  Surrounding left breast tissue was biopsied as well.  During removal, a full thickness defect was made in the nipple.  The specimen was sent to pathology.  Hemostasis was obtained using electrocautery.  Local anesthetic was infiltrated into the deep portions of the wound.  The nipple defect was closed with interrupted 4-0 Monocryl sutures.  The subcutaneous tissue of the circumareolar incision was approximated with interrupted 3-0 Vicryl sutures.  The circumareolar skin incision was closed with a running 4-0 Monocryl stitch.  Steri strips and a sterile dressing was applied. A breast binder was placed.  She tolerated the procedure well without any apparent complications and was taken to the PACU in  satisfactory condition.   Disposition: PACU - hemodynamically stable.         Condition: stable

## 2014-04-30 NOTE — Anesthesia Procedure Notes (Signed)
Procedure Name: LMA Insertion Date/Time: 04/30/2014 7:42 AM Performed by: Melynda Ripple D Pre-anesthesia Checklist: Patient identified, Emergency Drugs available, Suction available and Patient being monitored Patient Re-evaluated:Patient Re-evaluated prior to inductionOxygen Delivery Method: Circle System Utilized Preoxygenation: Pre-oxygenation with 100% oxygen Intubation Type: IV induction Ventilation: Mask ventilation without difficulty LMA: LMA inserted LMA Size: 4.0 Number of attempts: 1 Airway Equipment and Method: Bite block Placement Confirmation: positive ETCO2 Tube secured with: Tape Dental Injury: Teeth and Oropharynx as per pre-operative assessment

## 2014-04-30 NOTE — Transfer of Care (Signed)
Immediate Anesthesia Transfer of Care Note  Patient: Monica Clark  Procedure(s) Performed: Procedure(s): LEFT NIPPLE DUCT EXCISION (Left) LEFT BREAST BIOPSY (Left)  Patient Location: PACU  Anesthesia Type:General  Level of Consciousness: awake, alert  and oriented  Airway & Oxygen Therapy: Patient Spontanous Breathing and Patient connected to face mask oxygen  Post-op Assessment: Report given to RN and Post -op Vital signs reviewed and stable  Post vital signs: Reviewed and stable  Last Vitals:  Filed Vitals:   04/30/14 0621  BP: 142/80  Pulse: 95  Temp: 36.5 C  Resp: 22    Complications: No apparent anesthesia complications

## 2014-04-30 NOTE — Interval H&P Note (Signed)
History and Physical Interval Note:  04/30/2014 7:32 AM  Monica Clark  has presented today for surgery, with the diagnosis of left nipple discharge and duct mass  The various methods of treatment have been discussed with the patient and family. After consideration of risks, benefits and other options for treatment, the patient has consented to  Procedure(s): LEFT NIPPLE DUCT EXCISION (Left) LEFT BREAST BIOPSY (Left) as a surgical intervention .  The patient's history has been reviewed, patient examined, no change in status, stable for surgery.  I have reviewed the patient's chart and labs.  Questions were answered to the patient's satisfaction.     Monica Clark

## 2014-04-30 NOTE — H&P (View-Only) (Signed)
Patient ID: Monica Clark, female   DOB: 1955/07/14, 59 y.o.   MRN: [id number]  Monica Clark 04/02/2014 3:15 PM Location: [facility] Patient #: [id number] DOB: [birth year] Single / Language: Cleophus Molt / Race: Black or African American Female History of Present Illness Monica Hollingshead MD; 04/02/2014 5:18 PM) Patient words: breast follow up.  The patient is a 59 year old female    Note:She is referred by Dr. Isaiah Blakes because of left nipple discharge and an abnormal ductogram. She's been having intermittent, yellowish left nipple discharge for approximately 3 years. She denies any bloody discharge. Investigation has included a mammogram and a ductogram. The ductogram demonstrated an area of narrowing/mass beneath the nipple at the 12 o'clock position. 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 She is referred here for further evaluation and treatment. No family or personal history of breast cancer.  Other Problems Elbert Ewings, CMA; 04/02/2014 3:15 PM) Diabetes Mellitus High blood pressure  Past Surgical History Elbert Ewings, CMA; 04/02/2014 3:15 PM) Cesarean Section - 1 Gallbladder Surgery - Open  Diagnostic Studies History Elbert Ewings, CMA; 04/02/2014 3:15 PM) Colonoscopy never Mammogram within last year  Allergies Elbert Ewings, CMA; 04/02/2014 3:18 PM) Codeine Phosphate *ANALGESICS - OPIOID*  Medication History Elbert Ewings, CMA; 04/02/2014 3:18 PM) MetFORMIN HCl (1000MG  Tablet, Oral) Active. Triamterene-HCTZ (37.5-25MG  Tablet, Oral) Active. Pravastatin Sodium (40MG  Tablet, Oral) Active.  Social History Elbert Ewings, [place]; 04/02/2014 3:15 PM) Caffeine use Coffee. No drug use Tobacco use Never smoker.  Family History Elbert Ewings, [place]; 04/02/2014 3:15 PM) Alcohol Abuse Father. Diabetes Mellitus Brother, Mother, Sister.  Pregnancy / Birth History Elbert Ewings, CMA; 04/02/2014 3:15 PM) Age at menarche 26 years. Gravida 3 Maternal age  16-25 Para 3     Review of Systems Elbert Ewings CMA; 04/02/2014 3:15 PM) General Present- Fatigue and Night Sweats. Not Present- Appetite Loss, Chills, Fever, Weight Gain and Weight Loss. Skin Present- Dryness. Not Present- Change in Wart/Mole, Hives, Jaundice, New Lesions, Non-Healing Wounds, Rash and Ulcer. HEENT Present- Visual Disturbances and Wears glasses/contact lenses. Not Present- Earache, Hearing Loss, Hoarseness, Nose Bleed, Oral Ulcers, Ringing in the Ears, Seasonal Allergies, Sinus Pain, Sore Throat and Yellow Eyes. Breast Present- Nipple Discharge. Not Present- Breast Mass, Breast Pain and Skin Changes. Cardiovascular Present- Swelling of Extremities. Not Present- Chest Pain, Difficulty Breathing Lying Down, Leg Cramps, Palpitations, Rapid Heart Rate and Shortness of Breath. Gastrointestinal Not Present- Abdominal Pain, Bloating, Bloody Stool, Change in Bowel Habits, Chronic diarrhea, Constipation, Difficulty Swallowing, Excessive gas, Gets full quickly at meals, Hemorrhoids, Indigestion, Nausea, Rectal Pain and Vomiting. Musculoskeletal Present- Joint Stiffness. Not Present- Back Pain, Joint Pain, Muscle Pain, Muscle Weakness and Swelling of Extremities. Neurological Present- Numbness. Not Present- Decreased Memory, Fainting, Headaches, Seizures, Tingling, Tremor, Trouble walking and Weakness. Endocrine Present- Hot flashes. Not Present- Cold Intolerance, Excessive Hunger, Hair Changes, Heat Intolerance and New Diabetes. Hematology Not Present- Easy Bruising, Excessive bleeding, Gland problems, HIV and Persistent Infections.  Vitals Elbert Ewings CMA; 04/02/2014 3:19 PM) 04/02/2014 3:18 PM Weight: 229 lb Height: 63in Body Surface Area: 2.15 m Body Mass Index: 40.57 kg/m Temp.: 96.92F  Pulse: 80 (Regular)  BP: 130/70 (Sitting, Left Arm, Standard)     Physical Exam Monica Hollingshead MD; 04/02/2014 5:20 PM)  The physical exam findings are as  follows: Note:General: Overweight female in NAD. Pleasant and cooperative.  CV: RRR, no murmur, no JVD.  CHEST: Breath sounds equal and clear. Respirations nonlabored.  BREASTS: Symmetrical in size. No dominant masses, left nipple  discharge is present at the 12:00 position. The discharge is straw colored.  LYMPHATIC: No palpable cervical, supraclavicular, axillary adenopathy.  NEUROLOGIC: Alert and oriented, answers questions appropriately, normal gait and station.  PSYCHIATRIC: Normal mood, affect , and behavior.    Assessment & Plan Monica Hollingshead MD; 04/02/2014 5:21 PM)  DISCHARGE FROM LEFT NIPPLE (611.79  N64.52) Impression: Ductogram demonstrates an area of abnormality suspicious for a mass. 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 I explained to her the 10-15% these could harbor precancerous lesions or potentially cancer. I recommended she have left nipple duct excision and left breast biopsy. She is in agreement with this.  Plan: Left breast duct excision and breast biopsy. The procedure and risks were discussed with her. Risks include but are not limited to bleeding, infection, wound healing problems, anesthesia, persistent nipple discharge, numbness, chronic pain.  Current Plans Schedule for Surgery  Jackolyn Confer, MD

## 2014-04-30 NOTE — Anesthesia Postprocedure Evaluation (Signed)
  Anesthesia Post-op Note  Patient: Monica Clark  Procedure(s) Performed: Procedure(s): LEFT NIPPLE DUCT EXCISION (Left) LEFT BREAST BIOPSY (Left)  Patient Location: PACU  Anesthesia Type: General   Level of Consciousness: awake, alert  and oriented  Airway and Oxygen Therapy: Patient Spontanous Breathing  Post-op Pain: mild  Post-op Assessment: Post-op Vital signs reviewed  Post-op Vital Signs: Reviewed  Last Vitals:  Filed Vitals:   04/30/14 0933  BP: 129/66  Pulse: 77  Temp:   Resp: 16    Complications: No apparent anesthesia complications

## 2014-04-30 NOTE — Discharge Instructions (Addendum)
[place] Office Phone Number [phone]  BREAST BIOPSY: POST OP INSTRUCTIONS  Always review your discharge instruction sheet given to you by the facility where your surgery was performed.  IF YOU HAVE DISABILITY OR FAMILY LEAVE FORMS, YOU MUST BRING THEM TO THE OFFICE FOR PROCESSING.  DO NOT GIVE THEM TO YOUR DOCTOR.  1. A prescription for pain medication may be given to you upon discharge.  Take your pain medication as prescribed, if needed.  If narcotic pain medicine is not needed, then you may take acetaminophen (Tylenol) or ibuprofen (Advil) as needed. 2. Take your usually prescribed medications unless otherwise directed 3. If you need a refill on your pain medication, please contact your pharmacy.  They will contact our office to request authorization.  Prescriptions will not be filled after 5pm or on week-ends. 4. You should eat very light the first 24 hours after surgery, such as soup, crackers, pudding, etc.  Resume your normal diet the day after surgery. 5. Most patients will experience some swelling and bruising in the breast.  Ice packs and a good support bra will help.  Swelling and bruising can take several days to resolve.  6. It is common to experience some constipation if taking pain medication after surgery.  Increasing fluid intake and taking a stool softener will usually help or prevent this problem from occurring.  A mild laxative (Milk of Magnesia or Miralax) should be taken according to package directions if there are no bowel movements after 48 hours. 7. Unless discharge instructions indicate otherwise, you may remove your bandages 48 hours after surgery, and you may shower at that time.  You may have steri-strips (small skin tapes) in place directly over the incision.  These strips should be left on the skin. 8. Wear the binder for 48 hours. 9. ACTIVITIES:  Light activity, no lifting over 10lbs for 2-3 days. You may resume regular daily activities  (gradually increasing) after that as tolerated.  Wearing a good support bra or sports bra minimizes pain and swelling.  You may have sexual intercourse when it is comfortable. a. You may drive when you no longer are taking prescription pain medication, you can comfortably wear a seatbelt, and you can safely maneuver your car and apply brakes. b. RETURN TO WORK: 4-7 days or when comfortable. You should see your doctor in the office for a follow-up appointment approximately two weeks after your surgery.  Please call and make this appointment [phone]). WHEN TO CALL YOUR DOCTOR: 1. Fever over 101.0 2. Nausea and/or vomiting. 3. Extreme swelling or bruising. 4. Continued bleeding from incision. 5. Increased pain, redness, or drainage from the incision.  The clinic staff is available to answer your questions during regular business hours.  Please dont hesitate to call and ask to speak to one of the nurses for clinical concerns.  If you have a medical emergency, go to the nearest emergency room or call 911.  A surgeon from [facility] is always on call at the hospital.  For further questions, please visit [email or web address]    Post Anesthesia Home Care Instructions  Activity: Get plenty of rest for the remainder of the day. A responsible adult should stay with you for 24 hours following the procedure.  For the next 24 hours, DO NOT: -Drive a car -Paediatric nurse -Drink alcoholic beverages -Take any medication unless instructed by your physician -Make any legal decisions or sign important papers.  Meals: Start with liquid foods such as gelatin or soup.  Progress to regular foods as tolerated. Avoid greasy, spicy, heavy foods. If nausea and/or vomiting occur, drink only clear liquids until the nausea and/or vomiting subsides. Call your physician if vomiting continues.  Special Instructions/Symptoms: Your throat may feel dry or sore from the anesthesia or the  breathing tube placed in your throat during surgery. If this causes discomfort, gargle with warm salt water. The discomfort should disappear within 24 hours.

## 2014-05-04 ENCOUNTER — Encounter (HOSPITAL_BASED_OUTPATIENT_CLINIC_OR_DEPARTMENT_OTHER): Payer: Self-pay | Admitting: General Surgery

## 2014-05-05 ENCOUNTER — Encounter: Payer: Self-pay | Admitting: General Surgery

## 2014-05-05 NOTE — Progress Notes (Signed)
Patient ID: Monica Clark, female   DOB: [birth year], 59 y.o.   MRN: [id number] Pathology discussed with her by phone.  Results are below.  Breast, biopsy, Left superior duct - ATYPICAL DUCTAL HYPERPLASIA. - INTRADUCTAL PAPILLOMA. - FIBROCYSTIC CHANGES

## 2014-05-13 ENCOUNTER — Other Ambulatory Visit: Payer: Self-pay | Admitting: Obstetrics & Gynecology

## 2014-07-01 ENCOUNTER — Telehealth: Payer: Self-pay | Admitting: *Deleted

## 2014-07-01 NOTE — Telephone Encounter (Signed)
Received referral from [facility].   Called pt and left a message for her to return my call so I can schedule a med onc appt.

## 2014-07-06 ENCOUNTER — Telehealth: Payer: Self-pay | Admitting: *Deleted

## 2014-07-06 NOTE — Telephone Encounter (Signed)
Left message for the pt to return my call so I can schedule a High Risk appt w/ her.

## 2014-07-13 ENCOUNTER — Telehealth: Payer: Self-pay | Admitting: *Deleted

## 2014-07-13 NOTE — Telephone Encounter (Signed)
Called pt w/ an appt and she requested a female so I confirmed 08/07/14 High Risk appt w/ pt.  Mailed calendar, welcoming packet & intake form to pt.  Emailed Jearld Fenton and [place] at [employer] to make them aware.  Placed a copy of the records in Dr. Ernestina Penna box and took one to HIM to scan.

## 2014-08-07 ENCOUNTER — Encounter: Payer: Self-pay | Admitting: Hematology

## 2014-08-07 ENCOUNTER — Ambulatory Visit (HOSPITAL_BASED_OUTPATIENT_CLINIC_OR_DEPARTMENT_OTHER): Payer: BC Managed Care – PPO | Admitting: Hematology

## 2014-08-07 ENCOUNTER — Ambulatory Visit: Payer: BC Managed Care – PPO

## 2014-08-07 VITALS — BP 150/88 | HR 66 | Temp 98.3°F | Resp 19 | Ht 63.0 in | Wt 222.0 lb

## 2014-08-07 DIAGNOSIS — N62 Hypertrophy of breast: Secondary | ICD-10-CM | POA: Diagnosis not present

## 2014-08-07 DIAGNOSIS — N6099 Unspecified benign mammary dysplasia of unspecified breast: Secondary | ICD-10-CM

## 2014-08-07 NOTE — Progress Notes (Signed)
Checked in new pt with no financial concerns prior to seeing the dr.  Abbott Pao has my card for any billing questions, concerns or in need of financial assistance.

## 2014-08-07 NOTE — Progress Notes (Signed)
[facility]  [phone] [phone]  Clinic New Consult Note   Patient Care Team: [name], MD as PCP - General (Family Medicine) 08/07/2014  CHIEF COMPLAINTS/PURPOSE OF CONSULTATION:  Left breast atypical ductal hyperplasia  HISTORY OF PRESENTING ILLNESS:  Monica Clark 59 y.o. female is here because of recently diagnosed left breast ADH, she presents to our high risk breast cancer clinic to discuss risk reduction management.  She has been having left nipple discharge for the past 1 year. She had multiple mammograms, and underwent ductogram on 03/04/2014, which showed dilatation of the terminal duct. She was referred to general surgeon Dr. Leeroy Bock, and underwent excision of left nipple ducts and left breast biopsy on 04/30/2014. The final surgical path showed ductal papilloma and a typical ductal hyperplasia.  She has recovered well from the surgery. She had normal breast discharge. The surgical pain has nearly resolved and she is doing well overall. She denies any significant pain, or other symptoms.  She had menopause at age of 25. She has been having vaginal spotting in the past 4-5 years. She had multiple endometrial biopsy which was negative. She is on provera.   GYN HISTORY  Menarchal: 12 LMP: 47 Contraceptive: no HRT: yes G3P3, she did breast feeding to her children:    MEDICAL HISTORY:  Past Medical History  Diagnosis Date  . Nipple discharge 04/2014    left breast  . Arthritis     hands  . Breast mass, left     duct mass  . Hypertension     states under control with med., has been on med. x 5 yr.  Marland Kitchen Heart murmur     states no known problems, has never seen a cardiologist  . Endometrial hyperplasia   . Immature cataract   . Non-insulin dependent type 2 diabetes mellitus     SURGICAL HISTORY: Past Surgical History  Procedure Laterality Date  . Cesarean section  1982  . Cholecystectomy  1989  . Breast ductal system  excision Left 04/30/2014    Procedure: LEFT NIPPLE DUCT EXCISION;  Surgeon: Jackolyn Confer, MD;  Location: [facility];  Service: General;  Laterality: Left;  . Breast biopsy Left 04/30/2014    Procedure: LEFT BREAST BIOPSY;  Surgeon: Jackolyn Confer, MD;  Location: [facility];  Service: General;  Laterality: Left;    SOCIAL HISTORY: History   Social History  . Marital Status: Single    Spouse Name: N/A  . Number of Children: N/A  . Years of Education: N/A   Occupational History  . Not on file.   Social History Main Topics  . Smoking status: Never Smoker   . Smokeless tobacco: Never Used  . Alcohol Use: Yes     Comment: rarely  . Drug Use: No  . Sexual Activity: Not on file   Other Topics Concern  . Not on file   Social History Narrative    FAMILY HISTORY: Family History  Problem Relation Age of Onset  . Diabetes Mother   . Heart disease Father   . Diabetes Sister   . Diabetes Brother     ALLERGIES:  is allergic to codeine.  MEDICATIONS:  Current Outpatient Prescriptions  Medication Sig Dispense Refill  . glimepiride (AMARYL) 4 MG tablet Take 4 mg by mouth daily with breakfast.    . HYDROcodone-acetaminophen (NORCO) 5-325 MG per tablet Take 1-2 tablets by mouth every 4 (four) hours as needed for severe pain. 40 tablet 0  .  medroxyPROGESTERone (PROVERA) 10 MG tablet Take 10 mg by mouth daily.    . metFORMIN (GLUCOPHAGE) 1000 MG tablet Take 1,000 mg by mouth 2 (two) times daily.     . ondansetron (ZOFRAN) 4 MG tablet Take 1 tablet (4 mg total) by mouth every 6 (six) hours as needed for nausea or vomiting. 20 tablet 0  . triamterene-hydrochlorothiazide (MAXZIDE-25) 37.5-25 MG per tablet Take 1 tablet by mouth daily.     No current facility-administered medications for this visit.    REVIEW OF SYSTEMS:   Constitutional: Denies fevers, chills or abnormal night sweats Eyes: Denies blurriness of vision, double vision or watery eyes Ears,  nose, mouth, throat, and face: Denies mucositis or sore throat Respiratory: Denies cough, dyspnea or wheezes Cardiovascular: Denies palpitation, chest discomfort or lower extremity swelling Gastrointestinal:  Denies nausea, heartburn or change in bowel habits Skin: Denies abnormal skin rashes Lymphatics: Denies new lymphadenopathy or easy bruising Neurological:Denies numbness, tingling or new weaknesses Behavioral/Psych: Mood is stable, no new changes  All other systems were reviewed with the patient and are negative.  PHYSICAL EXAMINATION: ECOG PERFORMANCE STATUS: 0 - Asymptomatic  Filed Vitals:   08/07/14 1525  BP: 150/88  Pulse: 66  Temp: 98.3 F (36.8 C)  Resp: 19   Filed Weights   08/07/14 1525  Weight: 222 lb (100.699 kg)    GENERAL:alert, no distress and comfortable SKIN: skin color, texture, turgor are normal, no rashes or significant lesions EYES: normal, conjunctiva are pink and non-injected, sclera clear OROPHARYNX:no exudate, no erythema and lips, buccal mucosa, and tongue normal  NECK: supple, thyroid normal size, non-tender, without nodularity LYMPH:  no palpable lymphadenopathy in the cervical, axillary or inguinal LUNGS: clear to auscultation and percussion with normal breathing effort HEART: regular rate & rhythm and no murmurs and no lower extremity edema ABDOMEN:abdomen soft, non-tender and normal bowel sounds Musculoskeletal:no cyanosis of digits and no clubbing  PSYCH: alert & oriented x 3 with fluent speech NEURO: no focal motor/sensory deficits  LABORATORY DATA:  I have reviewed the data as listed Lab Results  Component Value Date   WBC 10.7* 04/29/2014   HGB 13.6 04/29/2014   HCT 40.8 04/29/2014   MCV 89.5 04/29/2014   PLT 359 04/29/2014    Recent Labs  04/29/14 1525  NA 135  K 4.2  CL 100  CO2 25  GLUCOSE 273*  BUN 14  CREATININE 0.94  CALCIUM 9.5  GFRNONAA 66*  GFRAA 77*  PROT 8.4*  ALBUMIN 3.9  AST 58*  ALT 33  ALKPHOS 93    BILITOT 0.4   PATHOLOGY REPORT: Breast, biopsy, Left superior duct 04/30/2014 - ATYPICAL DUCTAL HYPERPLASIA. - INTRADUCTAL PAPILLOMA. - FIBROCYSTIC CHANGES. - SEE COMMENT. Microscopic Comment The surgical resection margin(s) of the specimen were inked and microscopically evaluated.  RADIOGRAPHIC STUDIES: I have personally reviewed the radiological images as listed and agreed with the findings in the report. No results found.  ASSESSMENT & PLAN:   59 year old postmenopausal African-American female with PMH of DM, HTN and obesity  #1 left breast atypical lobular hyperplasia -I discussed her imaging findings and surgical pathology results with her in great details. -We reviewed the natural history of atypical hyperplasia. It is consider a benign breast disease, however it does increase the risk of breast cancer by 3-5 fold. It is considered as a high risk for breast cancer. -We discussed annual screening mammogram on a yearly bases, which will detect early stage breast cancer. She agrees to continue. She is very  compliant on screening. Certainly she could benefit from a 3-D mammogram. We discussed self breast examination as well as ongoing clinical examination.  -We also discussed healthy diet and regular exercise,  weight loss and maintain normal BMI, calcium and vitamin D supplement, to reduce her risk of breast cancer -She is currently on hormonal replacement, which can potentially increase the risk of breast cancer. I recommend her to stop.  -We further discussed chemoprevention to breast cancer. I discussed the option of tamoxifen, raloxifene and anastrozole. These endocrine therapy agent is likely going to reduce the risk of breast cancer by 30-40%, however there is no data of survival benefit so far.  -The different side effects of this 3 agents were discussed with patient in great details. She has postmenopausal vaginal bleeding, unless she decides to have hysterectomy, I would not  recommend tamoxifen due to the risk of endometrial cancer. I also discussed the increased risk of cardiovascular disease with these agents, especially she has multiple other cardiovascular risks including obesity, diabetes and hypertension. Giving the potential side effects, my recommendation to take chemoprevention medication is moderate. -She is interested in chemoprevention, however,  She is concerned about the potential side effects. She'll think about it and let me know.  Follow-up: I plan to see her back in a few months if she decides to take chemoprevention agent. Otherwise she will follow-up with her primary care physician. She will call me with her decision.   All questions were answered. The patient knows to call the clinic with any problems, questions or concerns. I spent 55 minutes counseling the patient face to face. The total time spent in the appointment was 60 minutes and more than 50% was on counseling.     Truitt Merle, MD 08/07/2014 4:00 PM

## 2014-08-09 ENCOUNTER — Encounter: Payer: Self-pay | Admitting: Hematology

## 2014-08-09 DIAGNOSIS — N6099 Unspecified benign mammary dysplasia of unspecified breast: Secondary | ICD-10-CM | POA: Insufficient documentation

## 2014-08-28 ENCOUNTER — Telehealth: Payer: Self-pay | Admitting: *Deleted

## 2014-08-28 ENCOUNTER — Other Ambulatory Visit: Payer: Self-pay | Admitting: Obstetrics & Gynecology

## 2014-08-28 NOTE — Telephone Encounter (Signed)
Received a call from pt's OBG Dr. Janyth Pupa requesting to speak with Dr. Burr Medico about the pt's treatment plan.  Informed her that Dr. Burr Medico was out this week and I will send her an inbasket with the information for her to be contacted.  Sent Dr. Burr Medico the information w/ contact #s.

## 2014-09-14 ENCOUNTER — Encounter: Payer: Self-pay | Admitting: Gynecologic Oncology

## 2014-09-14 ENCOUNTER — Ambulatory Visit: Payer: BC Managed Care – PPO | Attending: Gynecologic Oncology | Admitting: Gynecologic Oncology

## 2014-09-14 VITALS — BP 172/87 | HR 79 | Temp 98.2°F | Resp 20 | Ht 63.0 in | Wt 225.7 lb

## 2014-09-14 DIAGNOSIS — N8500 Endometrial hyperplasia, unspecified: Secondary | ICD-10-CM | POA: Diagnosis present

## 2014-09-14 NOTE — Patient Instructions (Signed)
Preparing for your Surgery  Plan for surgery on August 23 with Dr. Denman George.  Pre-operative Testing -You will receive a phone call from presurgical testing at [facility] to arrange for a pre-operative testing appointment before your surgery.  This appointment normally occurs one to two weeks before your scheduled surgery.   -Bring your insurance card, copy of an advanced directive if applicable, medication list  -At that visit, you will be asked to sign a consent for a possible blood transfusion in case a transfusion becomes necessary during surgery.  The need for a blood transfusion is rare but having consent is a necessary part of your care.     -You should not be taking blood thinners or aspirin at least ten days prior to surgery unless instructed by your surgeon.  Day Before Surgery at [place] will be asked to take in only clear liquids the day before surgery.  Examples of clear liquids include broths, jello, and clear juices.  Avoid carbonated beverages. You will be advised to have nothing to eat or drink after midnight the evening before.    Your role in recovery Your role is to become active as soon as directed by your doctor, while still giving yourself time to heal.  Rest when you feel tired. You will be asked to do the following in order to speed your recovery:  - Cough and breathe deeply. This helps toclear and expand your lungs and can prevent pneumonia. You may be given a spirometer to practice deep breathing. A staff member will show you how to use the spirometer. - Do mild physical activity. Walking or moving your legs help your circulation and body functions return to normal. A staff member will help you when you try to walk and will provide you with simple exercises. Do not try to get up or walk alone the first time. - Actively manage your pain. Managing your pain lets you move in comfort. We will ask you to rate your pain on a scale of zero to 10. It is  your responsibility to tell your doctor or nurse where and how much you hurt so your pain can be treated.  Special Considerations -If you are diabetic, you may be placed on insulin after surgery to have closer control over your blood sugars to promote healing and recovery.  This does not mean that you will be discharged on insulin.  If applicable, your oral antidiabetics will be resumed when you are tolerating a solid diet.  -Your final pathology results from surgery should be available by the Friday after surgery and the results will be relayed to you when available.  Blood Transfusion Information WHAT IS A BLOOD TRANSFUSION? A transfusion is the replacement of blood or some of its parts. Blood is made up of multiple cells which provide different functions.  Red blood cells carry oxygen and are used for blood loss replacement.  White blood cells fight against infection.  Platelets control bleeding.  Plasma helps clot blood.  Other blood products are available for specialized needs, such as hemophilia or other clotting disorders. BEFORE THE TRANSFUSION  Who gives blood for transfusions?   You may be able to donate blood to be used at a later date on yourself (autologous donation).  Relatives can be asked to donate blood. This is generally not any safer than if you have received blood from a stranger. The same precautions are taken to ensure safety when a relative's blood is donated.  Healthy volunteers  who are fully evaluated to make sure their blood is safe. This is blood bank blood. Transfusion therapy is the safest it has ever been in the practice of medicine. Before blood is taken from a donor, a complete history is taken to make sure that person has no history of diseases nor engages in risky social behavior (examples are intravenous drug use or sexual activity with multiple partners). The donor's travel history is screened to minimize risk of transmitting infections, such as  malaria. The donated blood is tested for signs of infectious diseases, such as HIV and hepatitis. The blood is then tested to be sure it is compatible with you in order to minimize the chance of a transfusion reaction. If you or a relative donates blood, this is often done in anticipation of surgery and is not appropriate for emergency situations. It takes many days to process the donated blood. RISKS AND COMPLICATIONS Although transfusion therapy is very safe and saves many lives, the main dangers of transfusion include:   Getting an infectious disease.  Developing a transfusion reaction. This is an allergic reaction to something in the blood you were given. Every precaution is taken to prevent this. The decision to have a blood transfusion has been considered carefully by your caregiver before blood is given. Blood is not given unless the benefits outweigh the risks.

## 2014-09-14 NOTE — Progress Notes (Signed)
Consult Note: Gyn-Onc  Consult was requested by Dr. Nelda Marseille for the evaluation of Monica Clark 59 y.o. female with CAH and possible endometrial adenocarcinoma  CC:  Chief Complaint  Patient presents with  . endometrial hyperplasia    New consult    Assessment/Plan:  [name]  is a 59 y.o.  year old with at least atypical endometrial hyperplasia identified on endometrial biopsy in the setting of morbid obesity and diabetes mellitus. Pathologic features worrisome for carcinoma.  I had a lengthy discussion with Monica Clark regarding potential management options for this disease. We discussed that the gold standard treatment in a postmenopausal woman was hysterectomy with BSO and staging if invasive carcinoma was identified. An alternative would be medical therapy with progesterone and such as just releasing IUD. I discussed that medical management is associated with regression of disease in 11% of cases of malignancy and 40% of cases of hyperplasia.  I discussed that I think she is a candidate for a minimally invasive approach with robotic-assisted hysterectomy and BSO. I discussed anticipated surgical risk with this including  bleeding, infection, damage to internal organs (such as bladder,ureters, bowels), blood clot, reoperation and rehospitalization. I discussed that she is at increased risk given her morbid obesity, and comorbidity of poorly controlled diabetes. I encouraged her to attempt to optimize blood sugar control perioperatively.  I discussed that we will send her uterine specimen for frozen section and if invasive malignancy with high-risk features are found we will perform staging with lymphadenectomy. I discussed risks of lymphadenectomy including lymphedema, nerve dysfunction.   After hearing her options for intervention she is electing for surgery. We will schedule her for a robotic-assisted total hysterectomy, BSO, possible staging.  HPI: Monica Clark is a  59 year old gravida 3 para 3 who is seen in consultation at the request of Dr. Nelda Marseille for complex atypical hyperplasia possible invasive carcinoma.  The patient reports having postmenopausal bleeding for several months. She underwent biopsy evaluation with an endometrial biopsy in January 2016 for this bleeding which revealed benign endometrium with breakdown. A cervical polyp was removed in March 2016 which was also benign. Pap sampling from January 2016 was negative for glandular cells or atypia.  Ultrasound of the uterus on 05/13/2014 revealed a uterus measuring 4.4 x 7.9 x 4.6 cm with a thickened endometrial stripe. The ovaries were not visualized. The endometrial thickness is 8 mm.  When she continued having abnormal bleeding in the summer of 2016 Dr. Nelda Marseille repeated endometrial sampling on 08/28/2014. This revealed at least atypical endometrial hyperplasia associated with features worrisome for carcinoma.  The patient is morbidly obese with a BMI 40 kg meters squared. She's had prior surgeries of a cesarean section and cholecystectomy. She's had 2 prior vaginal deliveries. She is no significant family history for GYN: Cancer or Lynch syndrome. She states that her diabetes is not well controlled with random blood glucose is in the 160s.  Current Meds:  Outpatient Encounter Prescriptions as of 09/14/2014  Medication Sig  . glimepiride (AMARYL) 4 MG tablet Take 4 mg by mouth daily with breakfast.  . metFORMIN (GLUCOPHAGE) 1000 MG tablet Take 1,000 mg by mouth 2 (two) times daily.   Marland Kitchen triamterene-hydrochlorothiazide (MAXZIDE-25) 37.5-25 MG per tablet Take 1 tablet by mouth daily.  Marland Kitchen HYDROcodone-acetaminophen (NORCO) 5-325 MG per tablet Take 1-2 tablets by mouth every 4 (four) hours as needed for severe pain. (Patient not taking: Reported on 08/07/2014)  . medroxyPROGESTERone (PROVERA) 10 MG tablet Take 10 mg by  mouth daily.  . ondansetron (ZOFRAN) 4 MG tablet Take 1 tablet (4 mg total) by mouth every 6  (six) hours as needed for nausea or vomiting. (Patient not taking: Reported on 08/07/2014)   No facility-administered encounter medications on file as of 09/14/2014.    Allergy:  Allergies  Allergen Reactions  . Codeine Other (See Comments)    INSOMNIA    Social Hx:   History   Social History  . Marital Status: Single    Spouse Name: N/A  . Number of Children: N/A  . Years of Education: N/A   Occupational History  . Not on file.   Social History Main Topics  . Smoking status: Never Smoker   . Smokeless tobacco: Never Used  . Alcohol Use: Yes     Comment: rarely  . Drug Use: No  . Sexual Activity: Not on file   Other Topics Concern  . Not on file   Social History Narrative    Past Surgical Hx:  Past Surgical History  Procedure Laterality Date  . Cesarean section  1982  . Cholecystectomy  1989  . Breast ductal system excision Left 04/30/2014    Procedure: LEFT NIPPLE DUCT EXCISION;  Surgeon: Jackolyn Confer, MD;  Location: [facility];  Service: General;  Laterality: Left;  . Breast biopsy Left 04/30/2014    Procedure: LEFT BREAST BIOPSY;  Surgeon: Jackolyn Confer, MD;  Location: [facility];  Service: General;  Laterality: Left;    Past Medical Hx:  Past Medical History  Diagnosis Date  . Nipple discharge 04/2014    left breast  . Arthritis     hands  . Breast mass, left     duct mass  . Hypertension     states under control with med., has been on med. x 5 yr.  Marland Kitchen Heart murmur     states no known problems, has never seen a cardiologist  . Endometrial hyperplasia   . Immature cataract   . Non-insulin dependent type 2 diabetes mellitus     Past Gynecological History:  See HPI  No LMP recorded. Patient is postmenopausal.  Family Hx:  Family History  Problem Relation Age of Onset  . Diabetes Mother   . Heart disease Father   . Cancer Father     throat cancer   . Diabetes Sister   . Diabetes Brother     Review of  Systems:  Constitutional  Feels well,    ENT Normal appearing ears and nares bilaterally Skin/Breast  No rash, sores, jaundice, itching, dryness Cardiovascular  No chest pain, shortness of breath, or edema  Pulmonary  No cough or wheeze.  Gastro Intestinal  No nausea, vomitting, or diarrhoea. No bright red blood per rectum, no abdominal pain, change in bowel movement, or constipation.  Genito Urinary  No frequency, urgency, dysuria, see HPI Musculo Skeletal  No myalgia, arthralgia, joint swelling or pain  Neurologic  No weakness, numbness, change in gait,  Psychology  No depression, anxiety, insomnia.   Vitals:  Blood pressure 172/87, pulse 79, temperature 98.2 F (36.8 C), temperature source Oral, resp. rate 20, height 5\' 3"  (1.6 m), weight 225 lb 11.2 oz (102.377 kg), SpO2 98 %.  Physical Exam: WD in NAD Neck  Supple NROM, without any enlargements.  Lymph Node Survey No cervical supraclavicular or inguinal adenopathy Cardiovascular  Pulse normal rate, regularity and rhythm. S1 and S2 normal.  Lungs  Clear to auscultation bilateraly, without wheezes/crackles/rhonchi. Good air movement.  Skin  No rash/lesions/breakdown  Psychiatry  Alert and oriented to person, place, and time  Abdomen  Normoactive bowel sounds, abdomen soft, non-tender and obese without evidence of hernia.  Back No CVA tenderness Genito Urinary  Vulva/vagina: Normal external female genitalia.  No lesions. No discharge or bleeding.  Bladder/urethra:  No lesions or masses, well supported bladder  Vagina: normal  Cervix: Normal appearing, no lesions.  Uterus: Small, mobile, no parametrial involvement or nodularity.  Adnexa: no palpable masses. Rectal  Good tone, no masses no cul de sac nodularity.  Extremities  No bilateral cyanosis, clubbing or edema.   Donaciano Eva, MD   09/14/2014, 5:07 PM

## 2014-10-08 NOTE — Patient Instructions (Addendum)
MAYLINE DRAGON  10/08/2014   Your procedure is scheduled on: 10/13/2014    Report to [facility] Main  Entrance take [place]  elevators to 3rd floor to  [place] at    [place] [facility].  Call this number if you have problems the morning of surgery [phone]   Remember: ONLY 1 PERSON MAY GO WITH YOU TO SHORT STAY TO GET  READY MORNING OF [place].  Do not eat food or drink liquids :After Midnight.              Clear liquid diet beginning on Monday, 10/12/2014.       Take these medicines the morning of surgery with A SIP OF WATER: none                                You may not have any metal on your body including hair pins and              piercings  Do not wear jewelry, make-up, lotions, powders or perfumes, deodorant             Do not wear nail polish.  Do not shave  48 hours prior to surgery.                 Do not bring valuables to the hospital. [place].  Contacts, dentures or bridgework may not be worn into surgery.  Leave suitcase in the car. After surgery it may be brought to your room.         Special Instructions: coughing and deep breathing exercises, leg exercises               Please read over the following fact sheets you were given: _____________________________________________________________________             [facility] - Preparing for Surgery Before surgery, you can play an important role.  Because skin is not sterile, your skin needs to be as free of germs as possible.  You can reduce the number of germs on your skin by washing with CHG (chlorahexidine gluconate) soap before surgery.  CHG is an antiseptic cleaner which kills germs and bonds with the skin to continue killing germs even after washing. Please DO NOT use if you have an allergy to CHG or antibacterial soaps.  If your skin becomes reddened/irritated stop using the CHG and inform your nurse when you arrive at Short  Stay. Do not shave (including legs and underarms) for at least 48 hours prior to the first CHG shower.  You may shave your face/neck. Please follow these instructions carefully:  1.  Shower with CHG Soap the night before surgery and the  morning of Surgery.  2.  If you choose to wash your hair, wash your hair first as usual with your  normal  shampoo.  3.  After you shampoo, rinse your hair and body thoroughly to remove the  shampoo.                           4.  Use CHG as you would any other liquid soap.  You can apply chg directly  to the skin and wash  Gently with a scrungie or clean washcloth.  5.  Apply the CHG Soap to your body ONLY FROM THE NECK DOWN.   Do not use on face/ open                           Wound or open sores. Avoid contact with eyes, ears mouth and genitals (private parts).                       Wash face,  Genitals (private parts) with your normal soap.             6.  Wash thoroughly, paying special attention to the area where your surgery  will be performed.  7.  Thoroughly rinse your body with warm water from the neck down.  8.  DO NOT shower/wash with your normal soap after using and rinsing off  the CHG Soap.                9.  Pat yourself dry with a clean towel.            10.  Wear clean pajamas.            11.  Place clean sheets on your bed the night of your first shower and do not  sleep with pets. Day of Surgery : Do not apply any lotions/deodorants the morning of surgery.  Please wear clean clothes to the hospital/surgery center.  FAILURE TO FOLLOW THESE INSTRUCTIONS MAY RESULT IN THE CANCELLATION OF YOUR SURGERY PATIENT SIGNATURE_________________________________  NURSE SIGNATURE__________________________________  ________________________________________________________________________  WHAT IS A BLOOD TRANSFUSION? Blood Transfusion Information  A transfusion is the replacement of blood or some of its parts. Blood is made up of  multiple cells which provide different functions.  Red blood cells carry oxygen and are used for blood loss replacement.  White blood cells fight against infection.  Platelets control bleeding.  Plasma helps clot blood.  Other blood products are available for specialized needs, such as hemophilia or other clotting disorders. BEFORE THE TRANSFUSION  Who gives blood for transfusions?   Healthy volunteers who are fully evaluated to make sure their blood is safe. This is blood bank blood. Transfusion therapy is the safest it has ever been in the practice of medicine. Before blood is taken from a donor, a complete history is taken to make sure that person has no history of diseases nor engages in risky social behavior (examples are intravenous drug use or sexual activity with multiple partners). The donor's travel history is screened to minimize risk of transmitting infections, such as malaria. The donated blood is tested for signs of infectious diseases, such as HIV and hepatitis. The blood is then tested to be sure it is compatible with you in order to minimize the chance of a transfusion reaction. If you or a relative donates blood, this is often done in anticipation of surgery and is not appropriate for emergency situations. It takes many days to process the donated blood. RISKS AND COMPLICATIONS Although transfusion therapy is very safe and saves many lives, the main dangers of transfusion include:  1. Getting an infectious disease. 2. Developing a transfusion reaction. This is an allergic reaction to something in the blood you were given. Every precaution is taken to prevent this. The decision to have a blood transfusion has been considered carefully by your caregiver before blood is given. Blood is not given unless the benefits outweigh  the risks. AFTER THE TRANSFUSION  Right after receiving a blood transfusion, you will usually feel much better and more energetic. This is especially true if  your red blood cells have gotten low (anemic). The transfusion raises the level of the red blood cells which carry oxygen, and this usually causes an energy increase.  The nurse administering the transfusion will monitor you carefully for complications. HOME CARE INSTRUCTIONS  No special instructions are needed after a transfusion. You may find your energy is better. Speak with your caregiver about any limitations on activity for underlying diseases you may have. SEEK MEDICAL CARE IF:   Your condition is not improving after your transfusion.  You develop redness or irritation at the intravenous (IV) site. SEEK IMMEDIATE MEDICAL CARE IF:  Any of the following symptoms occur over the next 12 hours:  Shaking chills.  You have a temperature by mouth above 102 F (38.9 C), not controlled by medicine.  Chest, back, or muscle pain.  People around you feel you are not acting correctly or are confused.  Shortness of breath or difficulty breathing.  Dizziness and fainting.  You get a rash or develop hives.  You have a decrease in urine output.  Your urine turns a dark color or changes to pink, red, or brown. Any of the following symptoms occur over the next 10 days:  You have a temperature by mouth above 102 F (38.9 C), not controlled by medicine.  Shortness of breath.  Weakness after normal activity.  The white part of the eye turns yellow (jaundice).  You have a decrease in the amount of urine or are urinating less often.  Your urine turns a dark color or changes to pink, red, or brown. Document Released: 02/04/2000 Document Revised: 05/01/2011 Document Reviewed: 09/23/2007 ExitCare Patient Information 2014 [place].  _______________________________________________________________________  Incentive Spirometer  An incentive spirometer is a tool that can help keep your lungs clear and active. This tool measures how well you are filling your lungs with each breath.  Taking long deep breaths may help reverse or decrease the chance of developing breathing (pulmonary) problems (especially infection) following:  A long period of time when you are unable to move or be active. BEFORE THE PROCEDURE   If the spirometer includes an indicator to show your best effort, your nurse or respiratory therapist will set it to a desired goal.  If possible, sit up straight or lean slightly forward. Try not to slouch.  Hold the incentive spirometer in an upright position. INSTRUCTIONS FOR USE  3. Sit on the edge of your bed if possible, or sit up as far as you can in bed or on a chair. 4. Hold the incentive spirometer in an upright position. 5. Breathe out normally. 6. Place the mouthpiece in your mouth and seal your lips tightly around it. 7. Breathe in slowly and as deeply as possible, raising the piston or the ball toward the top of the column. 8. Hold your breath for 3-5 seconds or for as long as possible. Allow the piston or ball to fall to the bottom of the column. 9. Remove the mouthpiece from your mouth and breathe out normally. 10. Rest for a few seconds and repeat Steps 1 through 7 at least 10 times every 1-2 hours when you are awake. Take your time and take a few normal breaths between deep breaths. 11. The spirometer may include an indicator to show your best effort. Use the indicator as a goal to work  toward during each repetition. 12. After each set of 10 deep breaths, practice coughing to be sure your lungs are clear. If you have an incision (the cut made at the time of surgery), support your incision when coughing by placing a pillow or rolled up towels firmly against it. Once you are able to get out of bed, walk around indoors and cough well. You may stop using the incentive spirometer when instructed by your caregiver.  RISKS AND COMPLICATIONS  Take your time so you do not get dizzy or light-headed.  If you are in pain, you may need to take or ask for  pain medication before doing incentive spirometry. It is harder to take a deep breath if you are having pain. AFTER USE  Rest and breathe slowly and easily.  It can be helpful to keep track of a log of your progress. Your caregiver can provide you with a simple table to help with this. If you are using the spirometer at home, follow these instructions: [place] IF:   You are having difficultly using the spirometer.  You have trouble using the spirometer as often as instructed.  Your pain medication is not giving enough relief while using the spirometer.  You develop fever of 100.5 F (38.1 C) or higher. SEEK IMMEDIATE MEDICAL CARE IF:   You cough up bloody sputum that had not been present before.  You develop fever of 102 F (38.9 C) or greater.  You develop worsening pain at or near the incision site. MAKE SURE YOU:   Understand these instructions.  Will watch your condition.  Will get help right away if you are not doing well or get worse. Document Released: 06/19/2006 Document Revised: 05/01/2011 Document Reviewed: 08/20/2006 ExitCare Patient Information 2014 ExitCare, [place].   ________________________________________________________________________    CLEAR LIQUID DIET   Foods Allowed                                                                     Foods Excluded  Coffee and tea, regular and decaf                             liquids that you cannot  Plain Jell-O in any flavor                                             see through such as: Fruit ices (not with fruit pulp)                                     milk, soups, orange juice  Iced Popsicles                                    All solid food Carbonated beverages, regular and diet  Cranberry, grape and apple juices Sports drinks like Gatorade Lightly seasoned clear broth or consume(fat free) Sugar, honey syrup  Sample Menu Breakfast                                 Lunch                                     Supper Cranberry juice                    Beef broth                            Chicken broth Jell-O                                     Grape juice                           Apple juice Coffee or tea                        Jell-O                                      Popsicle                                                Coffee or tea                        Coffee or tea  _____________________________________________________________________

## 2014-10-09 ENCOUNTER — Encounter (HOSPITAL_COMMUNITY): Payer: Self-pay

## 2014-10-09 ENCOUNTER — Encounter (HOSPITAL_COMMUNITY)
Admission: RE | Admit: 2014-10-09 | Discharge: 2014-10-09 | Disposition: A | Discharge status: Home / Self Care | Payer: BC Managed Care – PPO | Source: Ambulatory Visit | Attending: Gynecologic Oncology | Admitting: Gynecologic Oncology

## 2014-10-09 DIAGNOSIS — Z01812 Encounter for preprocedural laboratory examination: Secondary | ICD-10-CM | POA: Diagnosis not present

## 2014-10-09 DIAGNOSIS — N8500 Endometrial hyperplasia, unspecified: Secondary | ICD-10-CM | POA: Insufficient documentation

## 2014-10-09 LAB — COMPREHENSIVE METABOLIC PANEL
ALBUMIN: 4.1 g/dL (ref 3.5–5.0)
ALT: 34 U/L (ref 14–54)
ANION GAP: 8 (ref 5–15)
AST: 56 U/L — ABNORMAL HIGH (ref 15–41)
Alkaline Phosphatase: 85 U/L (ref 38–126)
BUN: 13 mg/dL (ref 6–20)
CO2: 25 mmol/L (ref 22–32)
Calcium: 9.2 mg/dL (ref 8.9–10.3)
Chloride: 106 mmol/L (ref 101–111)
Creatinine, Ser: 0.68 mg/dL (ref 0.44–1.00)
GFR calc Af Amer: >60 mL/min (ref >60)
GFR calc non Af Amer: >60 mL/min (ref >60)
GLUCOSE: 229 mg/dL — AB (ref 65–99)
POTASSIUM: 3.9 mmol/L (ref 3.5–5.1)
SODIUM: 139 mmol/L (ref 135–145)
TOTAL PROTEIN: 8.5 g/dL — AB (ref 6.5–8.1)
Total Bilirubin: 0.5 mg/dL (ref 0.3–1.2)

## 2014-10-09 LAB — CBC WITH DIFFERENTIAL/PLATELET
Basophils Absolute: 0 10*3/uL (ref 0.0–0.1)
Basophils Relative: 0 % (ref 0–1)
Eosinophils Absolute: 0.2 10*3/uL (ref 0.0–0.7)
Eosinophils Relative: 2 % (ref 0–5)
HEMATOCRIT: 40.7 % (ref 36.0–46.0)
Hemoglobin: 13 g/dL (ref 12.0–15.0)
Lymphocytes Relative: 29 % (ref 12–46)
Lymphs Abs: 2.7 10*3/uL (ref 0.7–4.0)
MCH: 29.3 pg (ref 26.0–34.0)
MCHC: 31.9 g/dL (ref 30.0–36.0)
MCV: 91.9 fL (ref 78.0–100.0)
MONO ABS: 0.3 10*3/uL (ref 0.1–1.0)
MONOS PCT: 4 % (ref 3–12)
NEUTROS ABS: 6.1 10*3/uL (ref 1.7–7.7)
Neutrophils Relative %: 65 % (ref 43–77)
PLATELETS: 350 10*3/uL (ref 150–400)
RBC: 4.43 MIL/uL (ref 3.87–5.11)
RDW: 13.8 % (ref 11.5–15.5)
WBC: 9.3 10*3/uL (ref 4.0–10.5)

## 2014-10-09 LAB — URINALYSIS, ROUTINE W REFLEX MICROSCOPIC
Bilirubin Urine: NEGATIVE
Hgb urine dipstick: NEGATIVE
KETONES UR: NEGATIVE mg/dL
LEUKOCYTES UA: NEGATIVE
NITRITE: NEGATIVE
PROTEIN: NEGATIVE mg/dL
Specific Gravity, Urine: 1.03 (ref 1.005–1.030)
Urobilinogen, UA: 0.2 mg/dL (ref 0.0–1.0)
pH: 6 (ref 5.0–8.0)

## 2014-10-09 LAB — URINE MICROSCOPIC-ADD ON

## 2014-10-09 LAB — ABO/RH: ABO/RH(D): AB POS

## 2014-10-09 NOTE — Progress Notes (Signed)
EKG- 04/29/2014 EPIC

## 2014-10-09 NOTE — Progress Notes (Signed)
U/a and micro results along with CMP results done 10/09/14 faxed via EPIC to Dr Denman George and Zoila Shutter.  Also spoke with Melissa Cross,Np regarding lab results.

## 2014-10-12 ENCOUNTER — Telehealth: Payer: Self-pay | Admitting: Gynecologic Oncology

## 2014-10-12 NOTE — Telephone Encounter (Signed)
Left message for patient to call the office.  Advised about concerns with her pre-op labs.  Dr. Denman George reviewed the labs.  Per Dr. Denman George, ok to proceed with scheduled surgery for tomorrow but wanted to make sure that patient knew there could be a possibility her surgery by anesthesia would be cancelled if her blood sugar was too elevated.

## 2014-10-12 NOTE — Anesthesia Preprocedure Evaluation (Addendum)
Anesthesia Evaluation  Patient identified by MRN, date of birth, ID band Patient awake    Reviewed: Allergy & Precautions, H&P , NPO status , Patient's Chart, lab work & pertinent test results  Airway Mallampati: II  TM Distance: >3 FB Neck ROM: full    Dental no notable dental hx. (+) Dental Advisory Given, Teeth Intact   Pulmonary neg pulmonary ROS,  breath sounds clear to auscultation  Pulmonary exam normal       Cardiovascular hypertension, Pt. on medications Normal cardiovascular examRhythm:regular Rate:Normal     Neuro/Psych negative neurological ROS  negative psych ROS   GI/Hepatic negative GI ROS, Neg liver ROS,   Endo/Other  diabetes, Well Controlled, Type 2, Oral Hypoglycemic AgentsMorbid obesity  Renal/GU negative Renal ROS  negative genitourinary   Musculoskeletal   Abdominal (+) + obese,   Peds  Hematology negative hematology ROS (+)   Anesthesia Other Findings   Reproductive/Obstetrics negative OB ROS                            Anesthesia Physical Anesthesia Plan  ASA: III  Anesthesia Plan: General   Post-op Pain Management:    Induction: Intravenous  Airway Management Planned: Oral ETT  Additional Equipment:   Intra-op Plan:   Post-operative Plan: Extubation in OR  Informed Consent: I have reviewed the patients History and Physical, chart, labs and discussed the procedure including the risks, benefits and alternatives for the proposed anesthesia with the patient or authorized representative who has indicated his/her understanding and acceptance.   Dental Advisory Given  Plan Discussed with: CRNA and Surgeon  Anesthesia Plan Comments:         Anesthesia Quick Evaluation

## 2014-10-13 ENCOUNTER — Ambulatory Visit (HOSPITAL_COMMUNITY): Payer: BC Managed Care – PPO | Admitting: Anesthesiology

## 2014-10-13 ENCOUNTER — Encounter (HOSPITAL_COMMUNITY)
Admission: RE | Disposition: A | Discharge status: Home / Self Care | Payer: Self-pay | Source: Ambulatory Visit | Attending: Gynecologic Oncology

## 2014-10-13 ENCOUNTER — Ambulatory Visit (HOSPITAL_COMMUNITY)
Admission: RE | Admit: 2014-10-13 | Discharge: 2014-10-14 | Disposition: A | Discharge status: Home / Self Care | Payer: BC Managed Care – PPO | Source: Ambulatory Visit | Attending: Gynecologic Oncology | Admitting: Gynecologic Oncology

## 2014-10-13 ENCOUNTER — Encounter (HOSPITAL_COMMUNITY): Payer: Self-pay

## 2014-10-13 DIAGNOSIS — Z23 Encounter for immunization: Secondary | ICD-10-CM | POA: Insufficient documentation

## 2014-10-13 DIAGNOSIS — Z79899 Other long term (current) drug therapy: Secondary | ICD-10-CM | POA: Insufficient documentation

## 2014-10-13 DIAGNOSIS — E119 Type 2 diabetes mellitus without complications: Secondary | ICD-10-CM | POA: Diagnosis not present

## 2014-10-13 DIAGNOSIS — C541 Malignant neoplasm of endometrium: Secondary | ICD-10-CM | POA: Diagnosis not present

## 2014-10-13 DIAGNOSIS — N950 Postmenopausal bleeding: Secondary | ICD-10-CM | POA: Diagnosis not present

## 2014-10-13 DIAGNOSIS — N8500 Endometrial hyperplasia, unspecified: Secondary | ICD-10-CM | POA: Diagnosis present

## 2014-10-13 DIAGNOSIS — I10 Essential (primary) hypertension: Secondary | ICD-10-CM | POA: Diagnosis not present

## 2014-10-13 DIAGNOSIS — Z6841 Body Mass Index (BMI) 40.0 and over, adult: Secondary | ICD-10-CM | POA: Insufficient documentation

## 2014-10-13 DIAGNOSIS — N8502 Endometrial intraepithelial neoplasia [EIN]: Secondary | ICD-10-CM | POA: Diagnosis present

## 2014-10-13 HISTORY — PX: ROBOTIC ASSISTED TOTAL HYSTERECTOMY WITH BILATERAL SALPINGO OOPHERECTOMY: SHX6086

## 2014-10-13 LAB — TYPE AND SCREEN
ABO/RH(D): AB POS
Antibody Screen: NEGATIVE

## 2014-10-13 LAB — GLUCOSE, CAPILLARY
GLUCOSE-CAPILLARY: 254 mg/dL — AB (ref 65–99)
Glucose-Capillary: 193 mg/dL — ABNORMAL HIGH (ref 65–99)
Glucose-Capillary: 221 mg/dL — ABNORMAL HIGH (ref 65–99)
Glucose-Capillary: 318 mg/dL — ABNORMAL HIGH (ref 65–99)
Glucose-Capillary: 268 mg/dL — ABNORMAL HIGH (ref 65–99)

## 2014-10-13 SURGERY — ROBOTIC ASSISTED TOTAL HYSTERECTOMY WITH BILATERAL SALPINGO OOPHORECTOMY
Anesthesia: General | Laterality: Bilateral

## 2014-10-13 MED ORDER — LIDOCAINE HCL (PF) 2 % IJ SOLN
INTRAMUSCULAR | Status: DC | PRN
  Administered 2014-10-13: 20 mg via INTRADERMAL

## 2014-10-13 MED ORDER — ENOXAPARIN SODIUM 40 MG/0.4ML ~~LOC~~ SOLN
40.0000 mg | SUBCUTANEOUS | Status: AC
  Administered 2014-10-13: 40 mg via SUBCUTANEOUS
  Filled 2014-10-13: qty 0.4

## 2014-10-13 MED ORDER — PROPOFOL 10 MG/ML IV BOLUS
INTRAVENOUS | Status: DC | PRN
  Administered 2014-10-13: 150 mg via INTRAVENOUS

## 2014-10-13 MED ORDER — ROCURONIUM BROMIDE 100 MG/10ML IV SOLN
INTRAVENOUS | Status: DC | PRN
  Administered 2014-10-13: 10 mg via INTRAVENOUS
  Administered 2014-10-13: 40 mg via INTRAVENOUS

## 2014-10-13 MED ORDER — LACTATED RINGERS IV SOLN: INTRAVENOUS | Status: DC

## 2014-10-13 MED ORDER — SUCCINYLCHOLINE CHLORIDE 20 MG/ML IJ SOLN
INTRAMUSCULAR | Status: DC | PRN
  Administered 2014-10-13: 100 mg via INTRAVENOUS

## 2014-10-13 MED ORDER — FENTANYL CITRATE (PF) 100 MCG/2ML IJ SOLN
INTRAMUSCULAR | Status: AC
  Filled 2014-10-13: qty 2

## 2014-10-13 MED ORDER — CEFAZOLIN SODIUM-DEXTROSE 2-3 GM-% IV SOLR
INTRAVENOUS | Status: AC
  Filled 2014-10-13: qty 50

## 2014-10-13 MED ORDER — ONDANSETRON HCL 4 MG/2ML IJ SOLN: 4.0000 mg | Freq: Four times a day (QID) | INTRAMUSCULAR | Status: DC | PRN

## 2014-10-13 MED ORDER — FENTANYL CITRATE (PF) 100 MCG/2ML IJ SOLN
INTRAMUSCULAR | Status: DC | PRN
  Administered 2014-10-13 (×3): 50 ug via INTRAVENOUS
  Administered 2014-10-13: 100 ug via INTRAVENOUS

## 2014-10-13 MED ORDER — ROCURONIUM BROMIDE 100 MG/10ML IV SOLN
INTRAVENOUS | Status: AC
  Filled 2014-10-13: qty 1

## 2014-10-13 MED ORDER — FENTANYL CITRATE (PF) 250 MCG/5ML IJ SOLN
INTRAMUSCULAR | Status: AC
  Filled 2014-10-13: qty 25

## 2014-10-13 MED ORDER — ENOXAPARIN SODIUM 40 MG/0.4ML ~~LOC~~ SOLN
40.0000 mg | SUBCUTANEOUS | Status: DC
  Administered 2014-10-14: 40 mg via SUBCUTANEOUS
  Filled 2014-10-13 (×2): qty 0.4

## 2014-10-13 MED ORDER — GLYCOPYRROLATE 0.2 MG/ML IJ SOLN
INTRAMUSCULAR | Status: DC | PRN
  Administered 2014-10-13: 0.6 mg via INTRAVENOUS

## 2014-10-13 MED ORDER — NEOSTIGMINE METHYLSULFATE 10 MG/10ML IV SOLN
INTRAVENOUS | Status: DC | PRN
  Administered 2014-10-13: 4 mg via INTRAVENOUS

## 2014-10-13 MED ORDER — FENTANYL CITRATE (PF) 100 MCG/2ML IJ SOLN
25.0000 ug | INTRAMUSCULAR | Status: DC | PRN
  Administered 2014-10-13 (×5): 50 ug via INTRAVENOUS

## 2014-10-13 MED ORDER — INSULIN ASPART 100 UNIT/ML ~~LOC~~ SOLN
0.0000 [IU] | Freq: Three times a day (TID) | SUBCUTANEOUS | Status: DC
  Administered 2014-10-13: 5 [IU] via SUBCUTANEOUS
  Administered 2014-10-13: 11 [IU] via SUBCUTANEOUS
  Administered 2014-10-14 (×3): 3 [IU] via SUBCUTANEOUS

## 2014-10-13 MED ORDER — HYDROMORPHONE HCL 2 MG/ML IJ SOLN
INTRAMUSCULAR | Status: AC
  Filled 2014-10-13: qty 1

## 2014-10-13 MED ORDER — KETOROLAC TROMETHAMINE 15 MG/ML IJ SOLN
15.0000 mg | Freq: Four times a day (QID) | INTRAMUSCULAR | Status: AC | PRN
  Administered 2014-10-13 – 2014-10-14 (×2): 15 mg via INTRAVENOUS
  Filled 2014-10-13: qty 1

## 2014-10-13 MED ORDER — ONDANSETRON HCL 4 MG PO TABS
4.0000 mg | ORAL_TABLET | Freq: Four times a day (QID) | ORAL | Status: DC | PRN
  Filled 2014-10-13: qty 1

## 2014-10-13 MED ORDER — PROPOFOL 10 MG/ML IV BOLUS
INTRAVENOUS | Status: AC
  Filled 2014-10-13: qty 20

## 2014-10-13 MED ORDER — MIDAZOLAM HCL 5 MG/5ML IJ SOLN
INTRAMUSCULAR | Status: DC | PRN
  Administered 2014-10-13: 2 mg via INTRAVENOUS

## 2014-10-13 MED ORDER — GLYCOPYRROLATE 0.2 MG/ML IJ SOLN
INTRAMUSCULAR | Status: AC
  Filled 2014-10-13: qty 3

## 2014-10-13 MED ORDER — HYDROMORPHONE HCL 1 MG/ML IJ SOLN
0.2000 mg | INTRAMUSCULAR | Status: DC | PRN
  Administered 2014-10-13 (×2): 0.6 mg via INTRAVENOUS
  Filled 2014-10-13 (×2): qty 1

## 2014-10-13 MED ORDER — KCL IN DEXTROSE-NACL 20-5-0.45 MEQ/L-%-% IV SOLN
INTRAVENOUS | Status: DC
  Administered 2014-10-13 – 2014-10-14 (×2): via INTRAVENOUS
  Filled 2014-10-13 (×2): qty 1000

## 2014-10-13 MED ORDER — MIDAZOLAM HCL 2 MG/2ML IJ SOLN
INTRAMUSCULAR | Status: AC
  Filled 2014-10-13: qty 4

## 2014-10-13 MED ORDER — ONDANSETRON HCL 4 MG/2ML IJ SOLN
INTRAMUSCULAR | Status: AC
  Filled 2014-10-13: qty 2

## 2014-10-13 MED ORDER — INSULIN ASPART 100 UNIT/ML ~~LOC~~ SOLN
4.0000 [IU] | Freq: Once | SUBCUTANEOUS | Status: AC
  Administered 2014-10-13: 4 [IU] via SUBCUTANEOUS

## 2014-10-13 MED ORDER — STERILE WATER FOR IRRIGATION IR SOLN
Status: DC | PRN
Start: 2014-10-13 — End: 2014-10-13
  Administered 2014-10-13: 1000 mL

## 2014-10-13 MED ORDER — LACTATED RINGERS IV SOLN
INTRAVENOUS | Status: DC
  Administered 2014-10-13: 1000 mL via INTRAVENOUS
  Administered 2014-10-13: 09:00:00 via INTRAVENOUS

## 2014-10-13 MED ORDER — DEXAMETHASONE SODIUM PHOSPHATE 10 MG/ML IJ SOLN
INTRAMUSCULAR | Status: DC | PRN
  Administered 2014-10-13: 10 mg via INTRAVENOUS

## 2014-10-13 MED ORDER — PNEUMOCOCCAL VAC POLYVALENT 25 MCG/0.5ML IJ INJ
0.5000 mL | INJECTION | INTRAMUSCULAR | Status: AC
Start: 2014-10-14 — End: 2014-10-14
  Administered 2014-10-14: 0.5 mL via INTRAMUSCULAR
  Filled 2014-10-13 (×2): qty 0.5

## 2014-10-13 MED ORDER — OXYCODONE-ACETAMINOPHEN 5-325 MG PO TABS
1.0000 | ORAL_TABLET | ORAL | Status: DC | PRN
  Administered 2014-10-13: 2 via ORAL
  Filled 2014-10-13: qty 2

## 2014-10-13 MED ORDER — FENTANYL CITRATE (PF) 100 MCG/2ML IJ SOLN: 25.0000 ug | INTRAMUSCULAR | Status: DC | PRN

## 2014-10-13 MED ORDER — LACTATED RINGERS IR SOLN
Status: DC | PRN
  Administered 2014-10-13: 1000 mL

## 2014-10-13 MED ORDER — ONDANSETRON HCL 4 MG/2ML IJ SOLN
INTRAMUSCULAR | Status: DC | PRN
Start: 2014-10-13 — End: 2014-10-13
  Administered 2014-10-13: 4 mg via INTRAVENOUS

## 2014-10-13 MED ORDER — CEFAZOLIN SODIUM-DEXTROSE 2-3 GM-% IV SOLR
2.0000 g | INTRAVENOUS | Status: AC
  Administered 2014-10-13: 2 g via INTRAVENOUS

## 2014-10-13 MED ORDER — KETOROLAC TROMETHAMINE 15 MG/ML IJ SOLN
INTRAMUSCULAR | Status: AC
  Filled 2014-10-13: qty 1

## 2014-10-13 SURGICAL SUPPLY — 53 items
BAG SPEC RTRVL LRG 6X4 10 (ENDOMECHANICALS)
CABLE HIGH FREQUENCY MONO STRZ (ELECTRODE) ×2 IMPLANT
CHLORAPREP W/TINT 26ML (MISCELLANEOUS) ×2 IMPLANT
CORDS BIPOLAR (ELECTRODE) ×2 IMPLANT
COVER SURGICAL LIGHT HANDLE (MISCELLANEOUS) ×2 IMPLANT
COVER TIP SHEARS 8 DVNC (MISCELLANEOUS) ×1 IMPLANT
COVER TIP SHEARS 8MM DA VINCI (MISCELLANEOUS) ×1
DRAPE SHEET LG 3/4 BI-LAMINATE (DRAPES) ×2 IMPLANT
DRAPE SURG IRRIG POUCH 19X23 (DRAPES) ×2 IMPLANT
DRAPE TABLE BACK 44X90 PK DISP (DRAPES) IMPLANT
DRAPE WARM FLUID 44X44 (DRAPE) ×2 IMPLANT
DRSG TEGADERM 6X8 (GAUZE/BANDAGES/DRESSINGS) IMPLANT
ELECT REM PT RETURN 9FT ADLT (ELECTROSURGICAL) ×2
ELECTRODE REM PT RTRN 9FT ADLT (ELECTROSURGICAL) ×1 IMPLANT
GLOVE BIO SURGEON STRL SZ 6 (GLOVE) ×6 IMPLANT
GLOVE BIO SURGEON STRL SZ 6.5 (GLOVE) ×4 IMPLANT
GOWN STRL REUS W/ TWL LRG LVL3 (GOWN DISPOSABLE) ×3 IMPLANT
GOWN STRL REUS W/TWL LRG LVL3 (GOWN DISPOSABLE) ×6
HOLDER FOLEY CATH W/STRAP (MISCELLANEOUS) ×2 IMPLANT
KIT ACCESSORY DA VINCI DISP (KITS) ×1
KIT ACCESSORY DVNC DISP (KITS) ×1 IMPLANT
KIT BASIN OR (CUSTOM PROCEDURE TRAY) ×2 IMPLANT
KIT PROCEDURE DA VINCI SI (MISCELLANEOUS)
KIT PROCEDURE DVNC SI (MISCELLANEOUS) IMPLANT
LIQUID BAND (GAUZE/BANDAGES/DRESSINGS) ×2 IMPLANT
MANIPULATOR UTERINE 4.5 ZUMI (MISCELLANEOUS) ×2 IMPLANT
NDL SAFETY ECLIPSE 18X1.5 (NEEDLE) IMPLANT
NEEDLE HYPO 18GX1.5 SHARP (NEEDLE)
NEEDLE SPNL 18GX3.5 QUINCKE PK (NEEDLE) IMPLANT
OCCLUDER COLPOPNEUMO (BALLOONS) ×2 IMPLANT
PEN SKIN MARKING BROAD (MISCELLANEOUS) ×2 IMPLANT
PORT ACCESS TROCAR AIRSEAL 12 (TROCAR) ×1 IMPLANT
PORT ACCESS TROCAR AIRSEAL 5M (TROCAR) ×1
POUCH SPECIMEN RETRIEVAL 10MM (ENDOMECHANICALS) IMPLANT
SET TRI-LUMEN FLTR TB AIRSEAL (TUBING) ×2 IMPLANT
SET TUBE IRRIG SUCTION NO TIP (IRRIGATION / IRRIGATOR) ×2 IMPLANT
SHEET LAVH (DRAPES) ×2 IMPLANT
SOLUTION ELECTROLUBE (MISCELLANEOUS) ×2 IMPLANT
SUT VIC AB 0 CT1 27 (SUTURE) ×2
SUT VIC AB 0 CT1 27XBRD ANTBC (SUTURE) ×1 IMPLANT
SUT VIC AB 4-0 PS2 27 (SUTURE) ×4 IMPLANT
SYR 50ML LL SCALE MARK (SYRINGE) ×2 IMPLANT
SYRINGE 10CC LL (SYRINGE) IMPLANT
TOWEL OR 17X26 10 PK STRL BLUE (TOWEL DISPOSABLE) ×2 IMPLANT
TOWEL OR NON WOVEN STRL DISP B (DISPOSABLE) ×2 IMPLANT
TRAP SPECIMEN MUCOUS 40CC (MISCELLANEOUS) IMPLANT
TRAY FOLEY W/METER SILVER 14FR (SET/KITS/TRAYS/PACK) ×2 IMPLANT
TRAY FOLEY W/METER SILVER 16FR (SET/KITS/TRAYS/PACK) IMPLANT
TRAY LAPAROSCOPIC (CUSTOM PROCEDURE TRAY) ×2 IMPLANT
TROCAR 12M 150ML BLUNT (TROCAR) ×2 IMPLANT
TROCAR BLADELESS OPT 5 100 (ENDOMECHANICALS) ×2 IMPLANT
TROCAR PORT AIRSEAL 5X120 (TROCAR) IMPLANT
WATER STERILE IRR 1500ML POUR (IV SOLUTION) IMPLANT

## 2014-10-13 NOTE — Addendum Note (Signed)
Addendum  created 10/13/14 1100 by Verlin Grills, CRNA   Modules edited: Charges VN

## 2014-10-13 NOTE — Anesthesia Postprocedure Evaluation (Signed)
  Anesthesia Post-op Note  Patient: Monica Clark  Procedure(s) Performed: Procedure(s) (LRB): ROBOTIC ASSISTED TOTAL HYSTERECTOMY WITH BILATERAL SALPINGO OOPHORECTOMY  (Bilateral)  Patient Location: PACU  Anesthesia Type: General  Level of Consciousness: awake and alert   Airway and Oxygen Therapy: Patient Spontanous Breathing  Post-op Pain: mild  Post-op Assessment: Post-op Vital signs reviewed, Patient's Cardiovascular Status Stable, Respiratory Function Stable, Patent Airway and No signs of Nausea or vomiting  Last Vitals:  Filed Vitals:   10/13/14 1048  BP:   Pulse: 80  Temp: 36.4 C  Resp: 14    Post-op Vital Signs: stable   Complications: No apparent anesthesia complications

## 2014-10-13 NOTE — Transfer of Care (Signed)
Immediate Anesthesia Transfer of Care Note  Patient: Monica Clark  Procedure(s) Performed: Procedure(s): ROBOTIC ASSISTED TOTAL HYSTERECTOMY WITH BILATERAL SALPINGO OOPHORECTOMY  (Bilateral)  Patient Location: PACU  Anesthesia Type:General  Level of Consciousness:  sedated, patient cooperative and responds to stimulation  Airway & Oxygen Therapy:Patient Spontanous Breathing and Patient connected to face mask oxgen  Post-op Assessment:  Report given to PACU RN and Post -op Vital signs reviewed and stable  Post vital signs:  Reviewed and stable  Last Vitals:  Filed Vitals:   10/13/14 0539  BP: 132/82  Pulse: 87  Temp: 36.6 C  Resp: 16    Complications: No apparent anesthesia complications

## 2014-10-13 NOTE — Anesthesia Procedure Notes (Signed)
Procedure Name: Intubation Date/Time: 10/13/2014 7:44 AM Performed by: Lajuana Carry E Pre-anesthesia Checklist: Patient identified, Emergency Drugs available, Suction available and Patient being monitored Patient Re-evaluated:Patient Re-evaluated prior to inductionOxygen Delivery Method: Circle System Utilized Preoxygenation: Pre-oxygenation with 100% oxygen Intubation Type: IV induction Ventilation: Mask ventilation without difficulty Laryngoscope Size: Miller and 3 Grade View: Grade I Tube type: Oral Tube size: 7.0 mm Number of attempts: 1 Airway Equipment and Method: Stylet Placement Confirmation: ETT inserted through vocal cords under direct vision,  positive ETCO2 and breath sounds checked- equal and bilateral Secured at: 21 cm Tube secured with: Tape Dental Injury: Teeth and Oropharynx as per pre-operative assessment

## 2014-10-13 NOTE — Interval H&P Note (Signed)
History and Physical Interval Note:  10/13/2014 7:30 AM  Monica Clark  has presented today for surgery, with the diagnosis of ENDOMETRIAL HYPERPLASIA   The various methods of treatment have been discussed with the patient and family. After consideration of risks, benefits and other options for treatment, the patient has consented to  Procedure(s): ROBOTIC ASSISTED TOTAL HYSTERECTOMY WITH BILATERAL SALPINGO OOPHORECTOMY POSSIBLE STAGING  (Bilateral) as a surgical intervention .  The patient's history has been reviewed, patient examined, no change in status, stable for surgery.  I have reviewed the patient's chart and labs.  Questions were answered to the patient's satisfaction.     Donaciano Eva

## 2014-10-13 NOTE — H&P (View-Only) (Signed)
Consult Note: Gyn-Onc  Consult was requested by Dr. Nelda Marseille for the evaluation of Monica Clark 59 y.o. female with CAH and possible endometrial adenocarcinoma  CC:  Chief Complaint  Patient presents with  . endometrial hyperplasia    New consult    Assessment/Plan:  [name]  is a 59 y.o.  year old with at least atypical endometrial hyperplasia identified on endometrial biopsy in the setting of morbid obesity and diabetes mellitus. Pathologic features worrisome for carcinoma.  I had a lengthy discussion with Monica Clark regarding potential management options for this disease. We discussed that the gold standard treatment in a postmenopausal woman was hysterectomy with BSO and staging if invasive carcinoma was identified. An alternative would be medical therapy with progesterone and such as just releasing IUD. I discussed that medical management is associated with regression of disease in 11% of cases of malignancy and 40% of cases of hyperplasia.  I discussed that I think she is a candidate for a minimally invasive approach with robotic-assisted hysterectomy and BSO. I discussed anticipated surgical risk with this including  bleeding, infection, damage to internal organs (such as bladder,ureters, bowels), blood clot, reoperation and rehospitalization. I discussed that she is at increased risk given her morbid obesity, and comorbidity of poorly controlled diabetes. I encouraged her to attempt to optimize blood sugar control perioperatively.  I discussed that we will send her uterine specimen for frozen section and if invasive malignancy with high-risk features are found we will perform staging with lymphadenectomy. I discussed risks of lymphadenectomy including lymphedema, nerve dysfunction.   After hearing her options for intervention she is electing for surgery. We will schedule her for a robotic-assisted total hysterectomy, BSO, possible staging.  HPI: Monica Clark is a  59 year old gravida 3 para 3 who is seen in consultation at the request of Dr. Nelda Marseille for complex atypical hyperplasia possible invasive carcinoma.  The patient reports having postmenopausal bleeding for several months. She underwent biopsy evaluation with an endometrial biopsy in January 2016 for this bleeding which revealed benign endometrium with breakdown. A cervical polyp was removed in March 2016 which was also benign. Pap sampling from January 2016 was negative for glandular cells or atypia.  Ultrasound of the uterus on 05/13/2014 revealed a uterus measuring 4.4 x 7.9 x 4.6 cm with a thickened endometrial stripe. The ovaries were not visualized. The endometrial thickness is 8 mm.  When she continued having abnormal bleeding in the summer of 2016 Dr. Nelda Marseille repeated endometrial sampling on 08/28/2014. This revealed at least atypical endometrial hyperplasia associated with features worrisome for carcinoma.  The patient is morbidly obese with a BMI 40 kg meters squared. She's had prior surgeries of a cesarean section and cholecystectomy. She's had 2 prior vaginal deliveries. She is no significant family history for GYN: Cancer or Lynch syndrome. She states that her diabetes is not well controlled with random blood glucose is in the 160s.  Current Meds:  Outpatient Encounter Prescriptions as of 09/14/2014  Medication Sig  . glimepiride (AMARYL) 4 MG tablet Take 4 mg by mouth daily with breakfast.  . metFORMIN (GLUCOPHAGE) 1000 MG tablet Take 1,000 mg by mouth 2 (two) times daily.   Marland Kitchen triamterene-hydrochlorothiazide (MAXZIDE-25) 37.5-25 MG per tablet Take 1 tablet by mouth daily.  Marland Kitchen HYDROcodone-acetaminophen (NORCO) 5-325 MG per tablet Take 1-2 tablets by mouth every 4 (four) hours as needed for severe pain. (Patient not taking: Reported on 08/07/2014)  . medroxyPROGESTERone (PROVERA) 10 MG tablet Take 10 mg by  mouth daily.  . ondansetron (ZOFRAN) 4 MG tablet Take 1 tablet (4 mg total) by mouth every 6  (six) hours as needed for nausea or vomiting. (Patient not taking: Reported on 08/07/2014)   No facility-administered encounter medications on file as of 09/14/2014.    Allergy:  Allergies  Allergen Reactions  . Codeine Other (See Comments)    INSOMNIA    Social Hx:   History   Social History  . Marital Status: Single    Spouse Name: N/A  . Number of Children: N/A  . Years of Education: N/A   Occupational History  . Not on file.   Social History Main Topics  . Smoking status: Never Smoker   . Smokeless tobacco: Never Used  . Alcohol Use: Yes     Comment: rarely  . Drug Use: No  . Sexual Activity: Not on file   Other Topics Concern  . Not on file   Social History Narrative    Past Surgical Hx:  Past Surgical History  Procedure Laterality Date  . Cesarean section  1982  . Cholecystectomy  1989  . Breast ductal system excision Left 04/30/2014    Procedure: LEFT NIPPLE DUCT EXCISION;  Surgeon: Jackolyn Confer, MD;  Location: [facility];  Service: General;  Laterality: Left;  . Breast biopsy Left 04/30/2014    Procedure: LEFT BREAST BIOPSY;  Surgeon: Jackolyn Confer, MD;  Location: [facility];  Service: General;  Laterality: Left;    Past Medical Hx:  Past Medical History  Diagnosis Date  . Nipple discharge 04/2014    left breast  . Arthritis     hands  . Breast mass, left     duct mass  . Hypertension     states under control with med., has been on med. x 5 yr.  Marland Kitchen Heart murmur     states no known problems, has never seen a cardiologist  . Endometrial hyperplasia   . Immature cataract   . Non-insulin dependent type 2 diabetes mellitus     Past Gynecological History:  See HPI  No LMP recorded. Patient is postmenopausal.  Family Hx:  Family History  Problem Relation Age of Onset  . Diabetes Mother   . Heart disease Father   . Cancer Father     throat cancer   . Diabetes Sister   . Diabetes Brother     Review of  Systems:  Constitutional  Feels well,    ENT Normal appearing ears and nares bilaterally Skin/Breast  No rash, sores, jaundice, itching, dryness Cardiovascular  No chest pain, shortness of breath, or edema  Pulmonary  No cough or wheeze.  Gastro Intestinal  No nausea, vomitting, or diarrhoea. No bright red blood per rectum, no abdominal pain, change in bowel movement, or constipation.  Genito Urinary  No frequency, urgency, dysuria, see HPI Musculo Skeletal  No myalgia, arthralgia, joint swelling or pain  Neurologic  No weakness, numbness, change in gait,  Psychology  No depression, anxiety, insomnia.   Vitals:  Blood pressure 172/87, pulse 79, temperature 98.2 F (36.8 C), temperature source Oral, resp. rate 20, height 5\' 3"  (1.6 m), weight 225 lb 11.2 oz (102.377 kg), SpO2 98 %.  Physical Exam: WD in NAD Neck  Supple NROM, without any enlargements.  Lymph Node Survey No cervical supraclavicular or inguinal adenopathy Cardiovascular  Pulse normal rate, regularity and rhythm. S1 and S2 normal.  Lungs  Clear to auscultation bilateraly, without wheezes/crackles/rhonchi. Good air movement.  Skin  No rash/lesions/breakdown  Psychiatry  Alert and oriented to person, place, and time  Abdomen  Normoactive bowel sounds, abdomen soft, non-tender and obese without evidence of hernia.  Back No CVA tenderness Genito Urinary  Vulva/vagina: Normal external female genitalia.  No lesions. No discharge or bleeding.  Bladder/urethra:  No lesions or masses, well supported bladder  Vagina: normal  Cervix: Normal appearing, no lesions.  Uterus: Small, mobile, no parametrial involvement or nodularity.  Adnexa: no palpable masses. Rectal  Good tone, no masses no cul de sac nodularity.  Extremities  No bilateral cyanosis, clubbing or edema.   Donaciano Eva, MD   09/14/2014, 5:07 PM

## 2014-10-13 NOTE — Op Note (Signed)
OPERATIVE NOTE  Surgeon: Donaciano Eva   Assistants: Dr Lahoma Crocker (an MD assistant was necessary for tissue manipulation, management of robotic instrumentation, retraction and positioning due to the complexity of the case and hospital policies).   Anesthesia: General endotracheal anesthesia  ASA Class: 2   Pre-operative Diagnosis: complex atypical endometrial hyperplasia  Post-operative Diagnosis: same  Operation: Robotic-assisted laparoscopic hysterectomy with bilateral salpingoophorectomy   Surgeon: Donaciano Eva  Assistant Surgeon: Lahoma Crocker MD  Anesthesia: GET  Urine Output: 150  Operative Findings:  : 8 week size normal appearing uterus, normal appearing tubes and ovaries. Frozen section revealed CAH, no definite invasive carcinoma. Adhesions between omentum to the anterior abdominal wall.   Estimated Blood Loss:  50cc      Total IV Fluids: 700 ml         Specimens: uterus, bilateral tubes and ovaries         Complications:  None; patient tolerated the procedure well.         Disposition: PACU - hemodynamically stable.  Procedure Details  The patient was seen in the Holding Room. The risks, benefits, complications, treatment options, and expected outcomes were discussed with the patient.  The patient concurred with the proposed plan, giving informed consent.  The site of surgery properly noted/marked. The patient was identified as Monica Clark and the procedure verified as a Robotic-assisted hysterectomy with bilateral salpingo oophorectomy. A Time Out was held and the above information confirmed.  After induction of anesthesia, the patient was draped and prepped in the usual sterile manner. Pt was placed in supine position after anesthesia and draped and prepped in the usual sterile manner. The abdominal drape was placed after the CholoraPrep had been allowed to dry for 3 minutes.  Her arms were tucked to her side with all appropriate  precautions.  The shoulders were stabilized to the bed with padded shoulder blocks on the acromium processes.  The patient was placed in the semi-lithotomy position in [place].  The perineum was prepped with Betadine.  Foley catheter was placed.  A sterile speculum was placed in the vagina.  The cervix was grasped with a single-tooth tenaculum and dilated with Kennon Rounds dilators.  The ZUMI uterine manipulator with a medium colpotomizer ring was placed without difficulty.  A pneum occluder balloon was placed over the manipulator.  A second time-out was performed.  OG tube placement was confirmed and to suction.    Procedure:  The patient was then draped in the normal manner.  Next, a 5 mm skin incision was made 1 cm below the subcostal margin in the midclavicular line.  The 5 mm Optiview port and scope was used for direct entry.  Opening pressure was under 10 mm CO2.  The trochar passed into the lesser sac during entry, with no apparent visceral injury or bleeding from the omentum noted. The abdomen was insufflated and the findings were noted as above.   At this point and all points during the procedure, the patient's intra-abdominal pressure did not exceed 15 mmHg. Next, a 10 mm skin incision was made in the umbilicus and a right and left port was placed about 10 cm lateral to the robot port on the right and left side. The adhesions to the anterior abdominal wall were taken down with sharp dissection.  A fourth arm was placed in the left lower quadrant 2 cm above and superior and medial to the anterior superior iliac spine.  All ports were placed atraumatically under  direct visualization.  The patient was placed in steep Trendelenburg.  Bowel was away into the upper abdomen.  The robot was docked in the normal manner.  The hysterectomy was started after the round ligament on the right side was incised and the retroperitoneum was entered and the pararectal space was developed.  The ureter was noted to be on  the medial leaf of the broad ligament.  The peritoneum above the ureter was incised and stretched and the infundibulopelvic ligament was skeletonized, cauterized and cut.  The posterior peritoneum was taken down to the level of the KOH ring.  The anterior peritoneum was also taken down.  The bladder flap was created to the level of the KOH ring.  The uterine artery on the right side was skeletonized, cauterized and cut in the normal manner.  A similar procedure was performed on the left.  The colpotomy was made and the uterus, cervix, bilateral ovaries and tubes were amputated and delivered through the vagina.  Pedicles were inspected and excellent hemostasis was achieved.  Frozen section of the uterine specimen returned with no apparent invasive endometrial cancer and the case was deemed completed.  The colpotomy at the vaginal cuff was closed with Vicryl on a CT1 needle in a running manner.  Irrigation was used and excellent hemostasis was achieved.  At this point in the procedure was completed.  Robotic instruments were removed under direct visulaization.  The robot was undocked. The 10 mm ports were closed with Vicryl on a UR-5 needle and the fascia was closed with 0 Vicryl on a UR-5 needle.  The skin was closed with 4-0 Vicryl in a subcuticular manner.  Dermabond was applied.  Sponge, lap and needle counts correct x 2.  The patient was taken to the recovery room in stable condition.  The vagina was swabbed with  minimal bleeding noted.   All instrument and needle counts were correct x  3.   The patient was transferred to the recovery room in a stable condition.  Donaciano Eva, MD

## 2014-10-14 ENCOUNTER — Encounter (HOSPITAL_COMMUNITY): Payer: Self-pay | Admitting: Gynecologic Oncology

## 2014-10-14 DIAGNOSIS — C541 Malignant neoplasm of endometrium: Secondary | ICD-10-CM | POA: Diagnosis not present

## 2014-10-14 LAB — CBC
HEMATOCRIT: 36.1 % (ref 36.0–46.0)
Hemoglobin: 11.7 g/dL — ABNORMAL LOW (ref 12.0–15.0)
MCH: 29.8 pg (ref 26.0–34.0)
MCHC: 32.4 g/dL (ref 30.0–36.0)
MCV: 92.1 fL (ref 78.0–100.0)
PLATELETS: 332 10*3/uL (ref 150–400)
RBC: 3.92 MIL/uL (ref 3.87–5.11)
RDW: 13.6 % (ref 11.5–15.5)
WBC: 13.5 10*3/uL — ABNORMAL HIGH (ref 4.0–10.5)

## 2014-10-14 LAB — BASIC METABOLIC PANEL
Anion gap: 9 (ref 5–15)
BUN: 16 mg/dL (ref 6–20)
CALCIUM: 8.4 mg/dL — AB (ref 8.9–10.3)
CO2: 23 mmol/L (ref 22–32)
CREATININE: 0.99 mg/dL (ref 0.44–1.00)
Chloride: 103 mmol/L (ref 101–111)
GFR calc Af Amer: >60 mL/min (ref >60)
GFR calc non Af Amer: >60 mL/min (ref >60)
GLUCOSE: 169 mg/dL — AB (ref 65–99)
Potassium: 3.4 mmol/L — ABNORMAL LOW (ref 3.5–5.1)
Sodium: 135 mmol/L (ref 135–145)

## 2014-10-14 LAB — GLUCOSE, CAPILLARY
GLUCOSE-CAPILLARY: 175 mg/dL — AB (ref 65–99)
GLUCOSE-CAPILLARY: 181 mg/dL — AB (ref 65–99)
Glucose-Capillary: 178 mg/dL — ABNORMAL HIGH (ref 65–99)

## 2014-10-14 MED ORDER — OXYCODONE-ACETAMINOPHEN 5-325 MG PO TABS: 1.0000 | ORAL_TABLET | ORAL | Status: DC | PRN

## 2014-10-14 NOTE — Progress Notes (Signed)
Pt voided 500 cc. Ambulating without difficulty.  Tolerating food and oral pain medication and desires discharge. Am assessment unchanged except iv discontinued and voiding qs.discharge instructions discussed with pt until no further questions ask.

## 2014-10-14 NOTE — Discharge Instructions (Signed)
10/14/2014  Return to work: 4-6 weeks if applicable  Activity: 1. Be up and out of the bed during the day.  Take a nap if needed.  You may walk up steps but be careful and use the hand rail.  Stair climbing will tire you more than you think, you may need to stop part way and rest.   2. No lifting or straining for 6 weeks.  3. No driving for 1 week(s).  Do not drive if you are taking narcotic pain medicine.  4. Shower daily.  Use soap and water on your incision and pat dry; don't rub.  No tub baths until cleared by your surgeon.   5. No sexual activity and nothing in the vagina for 8 weeks.  Diet: 1. Low sodium Heart Healthy Diet is recommended.  2. It is safe to use a laxative, such as Miralax or Colace, if you have difficulty moving your bowels.   Wound Care: 1. Keep clean and dry.  Shower daily.  Reasons to call the Doctor:  Fever - Oral temperature greater than 100.4 degrees Fahrenheit  Foul-smelling vaginal discharge  Difficulty urinating  Nausea and vomiting  Increased pain at the site of the incision that is unrelieved with pain medicine.  Difficulty breathing with or without chest pain  New calf pain especially if only on one side  Sudden, continuing increased vaginal bleeding with or without clots.   Contacts: For questions or concerns you should contact:  Dr. Everitt Amber at [phone]  Joylene John, NP at [phone]  After Hours: call [phone] and ask for the GYN Oncologist on call  Abdominal Hysterectomy, Care After These instructions give you information on caring for yourself after your procedure. Your doctor may also give you more specific instructions. Call your doctor if you have any problems or questions after your procedure.  HOME CARE It takes 4-6 weeks to recover from this surgery. Follow all of your doctor's instructions.   Only take medicines as told by your doctor.  Change your bandage as told by your doctor.  Return to your  doctor to have your stitches taken out.  Take showers for 2-3 weeks. Ask your doctor when it is okay to shower.  Do not douche, use tampons, or have sex (intercourse) for at least 6 weeks or as told.  Follow your doctor's advice about exercise, lifting objects, driving, and general activities.  Get plenty of rest and sleep.  Do not lift anything heavier than a gallon of milk (about 10 pounds [4.5 kilograms]) for the first month after surgery.  Get back to your normal diet as told by your doctor.  Do not drink alcohol until your doctor says it is okay.  Take a medicine to help you poop (laxative) as told by your doctor.  Eating foods high in fiber may help you poop. Eat a lot of raw fruits and vegetables, whole grains, and beans.  Drink enough fluids to keep your pee (urine) clear or pale yellow.  Have someone help you at home for 1-2 weeks after your surgery.  Keep follow-up doctor visits as told. GET HELP IF:  You have chills or fever.  You have puffiness, redness, or pain in area of the cut (incision).  You have yellowish-white fluid (pus) coming from the cut.  You have a bad smell coming from the cut or bandage.  Your cut pulls apart.  You feel dizzy or light-headed.  You have pain or bleeding when you pee.  You keep  having watery poop (diarrhea).  You keep feeling sick to your stomach (nauseous) or keep throwing up (vomiting).  You have fluid (discharge) coming from your vagina.  You have a rash.  You have a reaction to your medicine.  You need stronger pain medicine. GET HELP RIGHT AWAY IF:   You have a fever and your symptoms suddenly get worse.  You have bad belly (abdominal) pain.  You have chest pain.  You are short of breath.  You pass out (faint).  You have pain, puffiness, or redness of your leg.  You bleed a lot from your vagina and notice clumps of tissue (clots). MAKE SURE YOU:   Understand these instructions.  Will watch your  condition.  Will get help right away if you are not doing well or get worse. Document Released: 11/16/2007 Document Revised: 02/11/2013 Document Reviewed: 11/29/2012 [facility] Patient Information 2015 [place], [place]. This information is not intended to replace advice given to you by your health care provider. Make sure you discuss any questions you have with your health care provider.  Acetaminophen; Oxycodone tablets What is this medicine? ACETAMINOPHEN; OXYCODONE (a set a MEE noe fen; ox i KOE done) is a pain reliever. It is used to treat mild to moderate pain. This medicine may be used for other purposes; ask your health care provider or pharmacist if you have questions. COMMON BRAND NAME(S): Endocet, Magnacet, Narvox, Percocet, Perloxx, Primalev, Primlev, Roxicet, Xolox What should I tell my health care provider before I take this medicine? They need to know if you have any of these conditions: -brain tumor -Crohn's disease, inflammatory bowel disease, or ulcerative colitis -drug abuse or addiction -head injury -heart or circulation problems -if you often drink alcohol -kidney disease or problems going to the bathroom -liver disease -lung disease, asthma, or breathing problems -an unusual or allergic reaction to acetaminophen, oxycodone, other opioid analgesics, other medicines, foods, dyes, or preservatives -pregnant or trying to get pregnant -breast-feeding How should I use this medicine? Take this medicine by mouth with a full glass of water. Follow the directions on the prescription label. Take your medicine at regular intervals. Do not take your medicine more often than directed. Talk to your pediatrician regarding the use of this medicine in children. Special care may be needed. Patients over 69 years old may have a stronger reaction and need a smaller dose. Overdosage: If you think you have taken too much of this medicine contact a poison control center or emergency room at  once. NOTE: This medicine is only for you. Do not share this medicine with others. What if I miss a dose? If you miss a dose, take it as soon as you can. If it is almost time for your next dose, take only that dose. Do not take double or extra doses. What may interact with this medicine? -alcohol -antihistamines -barbiturates like amobarbital, butalbital, butabarbital, methohexital, pentobarbital, phenobarbital, thiopental, and secobarbital -benztropine -drugs for bladder problems like solifenacin, trospium, oxybutynin, tolterodine, hyoscyamine, and methscopolamine -drugs for breathing problems like ipratropium and tiotropium -drugs for certain stomach or intestine problems like propantheline, homatropine methylbromide, glycopyrrolate, atropine, belladonna, and dicyclomine -general anesthetics like etomidate, ketamine, nitrous oxide, propofol, desflurane, enflurane, halothane, isoflurane, and sevoflurane -medicines for depression, anxiety, or psychotic disturbances -medicines for sleep -muscle relaxants -naltrexone -narcotic medicines (opiates) for pain -phenothiazines like perphenazine, thioridazine, chlorpromazine, mesoridazine, fluphenazine, prochlorperazine, promazine, and trifluoperazine -scopolamine -tramadol -trihexyphenidyl This list may not describe all possible interactions. Give your health care provider a list of  all the medicines, herbs, non-prescription drugs, or dietary supplements you use. Also tell them if you smoke, drink alcohol, or use illegal drugs. Some items may interact with your medicine. What should I watch for while using this medicine? Tell your doctor or health care professional if your pain does not go away, if it gets worse, or if you have new or a different type of pain. You may develop tolerance to the medicine. Tolerance means that you will need a higher dose of the medication for pain relief. Tolerance is normal and is expected if you take this medicine for  a long time. Do not suddenly stop taking your medicine because you may develop a severe reaction. Your body becomes used to the medicine. This does NOT mean you are addicted. Addiction is a behavior related to getting and using a drug for a non-medical reason. If you have pain, you have a medical reason to take pain medicine. Your doctor will tell you how much medicine to take. If your doctor wants you to stop the medicine, the dose will be slowly lowered over time to avoid any side effects. You may get drowsy or dizzy. Do not drive, use machinery, or do anything that needs mental alertness until you know how this medicine affects you. Do not stand or sit up quickly, especially if you are an older patient. This reduces the risk of dizzy or fainting spells. Alcohol may interfere with the effect of this medicine. Avoid alcoholic drinks. There are different types of narcotic medicines (opiates) for pain. If you take more than one type at the same time, you may have more side effects. Give your health care provider a list of all medicines you use. Your doctor will tell you how much medicine to take. Do not take more medicine than directed. Call emergency for help if you have problems breathing. The medicine will cause constipation. Try to have a bowel movement at least every 2 to 3 days. If you do not have a bowel movement for 3 days, call your doctor or health care professional. Do not take Tylenol (acetaminophen) or medicines that have acetaminophen with this medicine. Too much acetaminophen can be very dangerous. Many nonprescription medicines contain acetaminophen. Always read the labels carefully to avoid taking more acetaminophen. What side effects may I notice from receiving this medicine? Side effects that you should report to your doctor or health care professional as soon as possible: -allergic reactions like skin rash, itching or hives, swelling of the face, lips, or tongue -breathing difficulties,  wheezing -confusion -light headedness or fainting spells -severe stomach pain -unusually weak or tired -yellowing of the skin or the whites of the eyes Side effects that usually do not require medical attention (report to your doctor or health care professional if they continue or are bothersome): -dizziness -drowsiness -nausea -vomiting This list may not describe all possible side effects. Call your doctor for medical advice about side effects. You may report side effects to FDA at 1-800-FDA-1088. Where should I keep my medicine? Keep out of the reach of children. This medicine can be abused. Keep your medicine in a safe place to protect it from theft. Do not share this medicine with anyone. Selling or giving away this medicine is dangerous and against the law. Store at room temperature between 20 and 25 degrees C (68 and 77 degrees F). Keep container tightly closed. Protect from light. This medicine may cause accidental overdose and death if it is taken by other adults,  children, or pets. Flush any unused medicine down the toilet to reduce the chance of harm. Do not use the medicine after the expiration date. NOTE: This sheet is a summary. It may not cover all possible information. If you have questions about this medicine, talk to your doctor, pharmacist, or health care provider.  2015, [employer] Standard. (2012-09-30 13:17:35)

## 2014-10-14 NOTE — Discharge Summary (Signed)
Physician Discharge Summary  Patient ID: Monica Clark MRN: [id number] DOB/AGE: 05/03/1955 59 y.o.  Admit date: 10/13/2014 Discharge date: 10/14/2014  Admission Diagnoses: Endometrial hyperplasia with atypia  Discharge Diagnoses:  Principal Problem:   Endometrial hyperplasia with atypia Active Problems:   Endometrial hyperplasia   Discharged Condition:  The patient is in good condition and stable for discharge.   Hospital Course: On 10/13/2014, the patient underwent the following: Procedure(s): ROBOTIC ASSISTED TOTAL HYSTERECTOMY WITH BILATERAL SALPINGO OOPHORECTOMY .   The postoperative course was uneventful.  She was discharged to home on postoperative day 1 tolerating a regular diet with minimal pain.  Consults: None  Significant Diagnostic Studies: None  Treatments: surgery: see above  Discharge Exam: Blood pressure 102/48, pulse 61, temperature 98.1 F (36.7 C), temperature source Oral, resp. rate 18, height 5' 3.5" (1.613 m), weight 219 lb (99.338 kg), SpO2 99 %. General appearance: alert, cooperative and no distress Resp: clear to auscultation bilaterally Cardio: regular rate and rhythm, S1, S2 normal, no murmur, click, rub or gallop GI: soft, non-tender; bowel sounds normal; no masses,  no organomegaly Extremities: extremities normal, atraumatic, no cyanosis or edema Incision/Wound: Lap sites with dermabond without erythema or drainage  Disposition: 01-Home or Self Care  Discharge Instructions    Call MD for:  difficulty breathing, headache or visual disturbances    Complete by:  As directed      Call MD for:  extreme fatigue    Complete by:  As directed      Call MD for:  hives    Complete by:  As directed      Call MD for:  persistant dizziness or light-headedness    Complete by:  As directed      Call MD for:  persistant nausea and vomiting    Complete by:  As directed      Call MD for:  redness, tenderness, or signs of infection (pain, swelling, redness,  odor or green/yellow discharge around incision site)    Complete by:  As directed      Call MD for:  severe uncontrolled pain    Complete by:  As directed      Call MD for:  temperature >100.4    Complete by:  As directed      Diet - low sodium heart healthy    Complete by:  As directed      Driving Restrictions    Complete by:  As directed   No driving for 1 week.  Do not take narcotics and drive.     Increase activity slowly    Complete by:  As directed      Lifting restrictions    Complete by:  As directed   No lifting greater than 10 lbs.     Sexual Activity Restrictions    Complete by:  As directed   No sexual activity, nothing in the vagina, for 8 weeks.            Medication List    STOP taking these medications        HYDROcodone-acetaminophen 5-325 MG per tablet  Commonly known as:  NORCO      TAKE these medications        glimepiride 4 MG tablet  Commonly known as:  AMARYL  Take 4 mg by mouth daily with breakfast.     metFORMIN 1000 MG tablet  Commonly known as:  GLUCOPHAGE  Take 1,000 mg by mouth 2 (two) times daily.  ondansetron 4 MG tablet  Commonly known as:  ZOFRAN  Take 1 tablet (4 mg total) by mouth every 6 (six) hours as needed for nausea or vomiting.     oxyCODONE-acetaminophen 5-325 MG per tablet  Commonly known as:  PERCOCET/ROXICET  Take 1-2 tablets by mouth every 4 (four) hours as needed (moderate to severe pain).     triamterene-hydrochlorothiazide 37.5-25 MG per tablet  Commonly known as:  MAXZIDE-25  Take 1 tablet by mouth every morning.           Follow-up Information    Follow up with Donaciano Eva, MD On 11/09/2014.   Specialty:  Obstetrics and Gynecology   Why:  at 2:45pm at the [facility] information:   [place] [id number] [phone]       Greater than thirty minutes were spend for face to face discharge instructions and discharge orders/summary in EPIC.   Signed: CROSS,  MELISSA DEAL 10/14/2014, 11:02 AM

## 2014-10-14 NOTE — Progress Notes (Signed)
Pt has only voided 125cc. Monica John NP aware. Pt encouraged to drink. Iv left in place . Will continue to monitor.

## 2014-10-19 ENCOUNTER — Telehealth: Payer: Self-pay | Admitting: Gynecologic Oncology

## 2014-10-19 NOTE — Telephone Encounter (Signed)
Informed patient of stage IA grade 1 endometrial cancer found on final pathology. Low risk features on pathology, therefore no adjuvant therapy required.  Donaciano Eva, MD

## 2014-10-30 ENCOUNTER — Encounter: Payer: Self-pay | Admitting: Gynecologic Oncology

## 2014-11-03 ENCOUNTER — Telehealth: Payer: Self-pay | Admitting: *Deleted

## 2014-11-03 NOTE — Telephone Encounter (Signed)
Called pt advised [employer] forms have been faxed. Pt advised she will be here for an appt on Monday and pick up originals. Advised pt we do not have [employer] forms. Pt to call [employer] to check on this claim she has filed.  No further concerns.

## 2014-11-09 ENCOUNTER — Encounter: Payer: Self-pay | Admitting: Gynecologic Oncology

## 2014-11-09 ENCOUNTER — Ambulatory Visit: Payer: BC Managed Care – PPO | Attending: Gynecologic Oncology | Admitting: Gynecologic Oncology

## 2014-11-09 DIAGNOSIS — Z8542 Personal history of malignant neoplasm of other parts of uterus: Secondary | ICD-10-CM | POA: Insufficient documentation

## 2014-11-09 DIAGNOSIS — C541 Malignant neoplasm of endometrium: Secondary | ICD-10-CM | POA: Insufficient documentation

## 2014-11-09 NOTE — Progress Notes (Signed)
POSTOPERATIVE VISIT: endometrial cancer  Assessment:    59 y.o. year old with Stage IA Grade 1 endometrioid endometrial cancer.   S/p robotic hysterectomy, BSO on 10/13/14. no LVSI, 10% myometrial invasion, pelvic washings and lymph nodes not sampled. Uterine pathology reveals low risk factors for recurrence.   Plan: 1) Pathology reports reviewed today 2) Treatment counseling - Very low risk (<5%) for recurrence given age, grade, depth of myometrial invasion and LVSI status. Multidisciplinary tumor board recommendation is for routine surveillance with frequent pelvic exams and visits with annual pap smear.  We will start with visits every 6 months for 5 years, at which time she can return to annual visits.  Discussed signs and symptoms of recurrence including vaginal bleeding or discharge, leg pain or swelling and changes in bowel or bladder habits. She was given the opportunity to ask questions, which were answered to her satisfaction, and she is agreement with the above mentioned plan of care.  3)  Return to clinic in 6 months to see me, then 12 months to see Dr Nelda Marseille  HPI:  AVIKA CARBINE is a 59 y.o. year old initially seen in consultation on 09/14/14 referred by Dr Nelda Marseille for complex atypical hyperplasia.  She then underwent a robotic hysterectomy, BSO on 07/22/07 without complications. Intraoperative pathology revealed CAH and no evidence of invasive carcinoma. Her postoperative course was uncomplicated.  Her final pathologic diagnosis is a Stage IA Grade 1 endometrioid endometrial cancer with no lymphovascular space invasion, 2/20 mm (10%) of myometrial invasion,  lymph nodes were not assessed as intraoperative frozen section revealed no invasive malignancy.  She is seen today for a postoperative check and to discuss her pathology results and treatment plan.  Since discharge from the hospital, she is feeling well.  She has improving appetite, normal bowel and bladder function, and pain controlled  with minimal PO medication. She has no other complaints today.  Current Outpatient Prescriptions on File Prior to Visit  Medication Sig Dispense Refill  . glimepiride (AMARYL) 4 MG tablet Take 4 mg by mouth daily with breakfast.    . metFORMIN (GLUCOPHAGE) 1000 MG tablet Take 1,000 mg by mouth 2 (two) times daily.     Marland Kitchen triamterene-hydrochlorothiazide (MAXZIDE-25) 37.5-25 MG per tablet Take 1 tablet by mouth every morning.     . ondansetron (ZOFRAN) 4 MG tablet Take 1 tablet (4 mg total) by mouth every 6 (six) hours as needed for nausea or vomiting. (Patient not taking: Reported on 08/07/2014) 20 tablet 0   No current facility-administered medications on file prior to visit.   Allergies  Allergen Reactions  . Codeine Other (See Comments)    INSOMNIA   Past Medical History  Diagnosis Date  . Nipple discharge 04/2014    left breast  . Breast mass, left     duct mass  . Hypertension     states under control with med., has been on med. x 5 yr.  . Endometrial hyperplasia   . Immature cataract   . Non-insulin dependent type 2 diabetes mellitus   . Heart murmur     states no known problems, has never seen a cardiologist  . Mucinous adenocarcinoma of uterus    Past Surgical History  Procedure Laterality Date  . Cesarean section  1982  . Cholecystectomy  1989  . Breast ductal system excision Left 04/30/2014    Procedure: LEFT NIPPLE DUCT EXCISION;  Surgeon: Jackolyn Confer, MD;  Location: [facility];  Service: General;  Laterality: Left;  .  Breast biopsy Left 04/30/2014    Procedure: LEFT BREAST BIOPSY;  Surgeon: Jackolyn Confer, MD;  Location: [facility];  Service: General;  Laterality: Left;  . Robotic assisted total hysterectomy with bilateral salpingo oopherectomy Bilateral 10/13/2014    Procedure: ROBOTIC ASSISTED TOTAL HYSTERECTOMY WITH BILATERAL SALPINGO OOPHORECTOMY ;  Surgeon: Everitt Amber, MD;  Location: [facility];  Service: Gynecology;  Laterality:  Bilateral;   Family History  Problem Relation Age of Onset  . Diabetes Mother   . Heart disease Father   . Cancer Father     throat cancer   . Diabetes Sister   . Diabetes Brother    Social History   Social History  . Marital Status: Single    Spouse Name: N/A  . Number of Children: N/A  . Years of Education: N/A   Occupational History  . Not on file.   Social History Main Topics  . Smoking status: Never Smoker   . Smokeless tobacco: Never Used  . Alcohol Use: Yes     Comment: rarely  . Drug Use: No  . Sexual Activity: Not on file   Other Topics Concern  . Not on file   Social History Narrative     Review of systems: Constitutional:  She has no weight gain or weight loss. She has no fever or chills. Eyes: No blurred vision Ears, Nose, Mouth, Throat: No dizziness, headaches or changes in hearing. No mouth sores. Cardiovascular: No chest pain, palpitations or edema. Respiratory:  No shortness of breath, wheezing or cough Gastrointestinal: She has normal bowel movements without diarrhea or constipation. She denies any nausea or vomiting. She denies blood in her stool or heart burn. Genitourinary:  She denies pelvic pain, pelvic pressure or changes in her urinary function. She has no hematuria, dysuria, or incontinence. She has no irregular vaginal bleeding or vaginal discharge Musculoskeletal: Denies muscle weakness or joint pains.  Skin:  She has no skin changes, rashes or itching Neurological:  Denies dizziness or headaches. No neuropathy, no numbness or tingling. Psychiatric:  She denies depression or anxiety. Hematologic/Lymphatic:   No easy bruising or bleeding   Physical Exam: There were no vitals taken for this visit. General: Well dressed, well nourished in no apparent distress.   HEENT:  Normocephalic and atraumatic, no lesions.  Extraocular muscles intact. Sclerae anicteric. Pupils equal, round, reactive. No mouth sores or ulcers. Thyroid is normal size,  not nodular, midline. Skin:  No lesions or rashes. Abdomen:  Soft, nontender, nondistended.  No palpable masses.  No hepatosplenomegaly.  No ascites. Normal bowel sounds.  No hernias.  Incisions are well healed Genitourinary: Normal EGBUS  Vaginal cuff intact.  No bleeding or discharge.  No cul de sac fullness. Extremities: No cyanosis, clubbing or edema.  No calf tenderness or erythema. No palpable cords. Psychiatric: Mood and affect are appropriate. Neurological: Awake, alert and oriented x 3. Sensation is intact, no neuropathy.  Musculoskeletal: No pain, normal strength and range of motion.   Donaciano Eva, MD

## 2014-11-09 NOTE — Patient Instructions (Signed)
Please follow up in March 2017 with Dr. Denman George and Dr. Nelda Marseille in one year or sooner if needed.  Please call in Nov or Dec 2016 to schedule your appt.  Please call for any questions or concerns.

## 2014-11-12 ENCOUNTER — Telehealth: Payer: Self-pay | Admitting: Hematology

## 2014-11-12 NOTE — Telephone Encounter (Signed)
pt cld to sch appt-gave pt time & date of appt °

## 2014-11-20 ENCOUNTER — Telehealth: Payer: Self-pay | Admitting: *Deleted

## 2014-11-20 NOTE — Telephone Encounter (Signed)
Received call from patient stating she experienced some nausea post-op but it had resolved when she had her post-op appt with Dr. Denman George on 11/09/14. She states she began experiencing nausea again approximately one and a half weeks ago. Pt denies any abdominal pain, fevers, or other symptoms. Patient states she has been having diarrhea but that is baseline for her before surgery as she experiences it as a side effect from taking metformin. Pt states the nausea comes and goes but denies vomiting. Inquired if patient has tried taking Zofran (which is noted as medication in EMR). Patient states she has not taken the Zofran out of "fear of masking any symptoms." Discussed with Joylene John, NP - instructed patient to take Zofran and see if her nausea subsides. Instructed patient that if she develops any additional symptoms such as the ones listed above over the weekend to report to the emergency department. Also, requested that patient call our office on Monday, 11/23/14, with an update on her status over the weekend - patient agreeable to this.

## 2014-11-24 ENCOUNTER — Telehealth: Payer: Self-pay | Admitting: Gynecologic Oncology

## 2014-11-24 NOTE — Telephone Encounter (Signed)
Patient called with an update on current situation.  Patient stating she is still having 5-6 loose stools a day.  She has intermittent chills but no documented fever.  The nausea is "a little better with the zofran" but states she worries the nausea will return if she stops taking it.  She stated her voice was raspy but is improving.  She does state she feels something in her stomach when she swallows food, etc.  She also saw her PCP this am and she states she is running some tests of her own.  Situation discussed with Dr. Denman George.  She stated it is "highly unusual for post-op complication this far out" and advises the patient monitor her temperature and call the office for temp greater than 100.4 and/or development of abd pain.  At that time, a CT AP may be ordered to rule out abscess.  Patient verbalizing understanding.  She will call the office with an update.

## 2014-11-26 ENCOUNTER — Encounter: Payer: Self-pay | Admitting: Hematology

## 2014-11-26 ENCOUNTER — Telehealth: Payer: Self-pay | Admitting: Hematology

## 2014-11-26 ENCOUNTER — Ambulatory Visit (HOSPITAL_BASED_OUTPATIENT_CLINIC_OR_DEPARTMENT_OTHER): Payer: BC Managed Care – PPO | Admitting: Hematology

## 2014-11-26 VITALS — BP 148/82 | HR 20 | Temp 98.3°F | Resp 18 | Ht 63.5 in | Wt 214.7 lb

## 2014-11-26 DIAGNOSIS — I10 Essential (primary) hypertension: Secondary | ICD-10-CM

## 2014-11-26 DIAGNOSIS — N6099 Unspecified benign mammary dysplasia of unspecified breast: Secondary | ICD-10-CM

## 2014-11-26 DIAGNOSIS — E119 Type 2 diabetes mellitus without complications: Secondary | ICD-10-CM

## 2014-11-26 DIAGNOSIS — C541 Malignant neoplasm of endometrium: Secondary | ICD-10-CM | POA: Diagnosis not present

## 2014-11-26 DIAGNOSIS — N62 Hypertrophy of breast: Secondary | ICD-10-CM | POA: Diagnosis not present

## 2014-11-26 MED ORDER — TAMOXIFEN CITRATE 20 MG PO TABS: 20.0000 mg | ORAL_TABLET | Freq: Every day | ORAL | Status: DC

## 2014-11-26 NOTE — Telephone Encounter (Signed)
lvm for pt regarding to dec appt...maield pt appt sched/avs and letter

## 2014-11-26 NOTE — Progress Notes (Signed)
[name]  Telephone:(336) [phone] Fax:(336) [phone]  Clinic follow Up Note   Patient Care Team: [name], MD as PCP - General (Family Medicine) 11/26/2014  CHIEF COMPLAINTS:  Follow up left breast atypical ductal hyperplasia  HISTORY OF PRESENTING ILLNESS:  Monica Clark 59 y.o. female is here because of recently diagnosed left breast ADH, she presents to our high risk breast cancer clinic to discuss risk reduction management.  She has been having left nipple discharge for the past 1 year. She had multiple mammograms, and underwent ductogram on 03/04/2014, which showed dilatation of the terminal duct. She was referred to general surgeon Dr. Leeroy Bock, and underwent excision of left nipple ducts and left breast biopsy on 04/30/2014. The final surgical path showed ductal papilloma and a typical ductal hyperplasia.  She has recovered well from the surgery. She had normal breast discharge. The surgical pain has nearly resolved and she is doing well overall. She denies any significant pain, or other symptoms.  She had menopause at age of 12. She has been having vaginal spotting in the past 4-5 years. She had multiple endometrial biopsy which was negative. She is on provera.   GYN HISTORY  Menarchal: 12 LMP: 47 Contraceptive: no HRT: yes G3P3, she did breast feeding to her children:  INTERIM HISTORY Letecia returns for follow-up. She was recently diagnosed with stage I endometrial cancer, status post hysterectomy and BSO on 10/13/2014. She has recovered well from surgery. She has a mild to moderate hot flash, no other new complaints. She is now more concerned about her risk of breast cancer, and returns to discuss breast cancer prevention. She feels well overall, denies any significant pain, or other symptoms.    MEDICAL HISTORY:  Past Medical History  Diagnosis Date  . Nipple discharge 04/2014    left breast  . Breast mass, left     duct mass  . Hypertension       states under control with med., has been on med. x 5 yr.  . Endometrial hyperplasia   . Immature cataract   . Non-insulin dependent type 2 diabetes mellitus ([place])   . Heart murmur     states no known problems, has never seen a cardiologist  . 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 Cesarean section  1982  . Cholecystectomy  1989  . Breast ductal system excision Left 04/30/2014    Procedure: LEFT NIPPLE DUCT EXCISION;  Surgeon: Jackolyn Confer, MD;  Location: [facility];  Service: General;  Laterality: Left;  . Breast biopsy Left 04/30/2014    Procedure: LEFT BREAST BIOPSY;  Surgeon: Jackolyn Confer, MD;  Location: [facility];  Service: General;  Laterality: Left;  . Robotic assisted total hysterectomy with bilateral salpingo oopherectomy Bilateral 10/13/2014    Procedure: ROBOTIC ASSISTED TOTAL HYSTERECTOMY WITH BILATERAL SALPINGO OOPHORECTOMY ;  Surgeon: Everitt Amber, MD;  Location: [facility];  Service: Gynecology;  Laterality: Bilateral;    SOCIAL HISTORY: Social History   Social History  . Marital Status: Single    Spouse Name: N/A  . Number of Children: N/A  . Years of Education: N/A   Occupational History  . Not on file.   Social History Main Topics  . Smoking status: Never Smoker   . Smokeless tobacco: Never Used  . Alcohol Use: Yes     Comment: rarely  . Drug Use: No  . Sexual Activity: Not  on file   Other Topics Concern  . Not on file   Social History Narrative    FAMILY HISTORY: Family History  Problem Relation Age of Onset  . Diabetes Mother   . Heart disease Father   . Cancer Father     throat cancer   . Diabetes Sister   . Diabetes Brother     ALLERGIES:  is allergic to codeine.  MEDICATIONS:  Current Outpatient Prescriptions  Medication Sig Dispense Refill  . glimepiride (AMARYL) 4 MG tablet Take 4 mg by mouth daily with breakfast.    .  metFORMIN (GLUCOPHAGE) 1000 MG tablet Take 1,000 mg by mouth 2 (two) times daily.     . ondansetron (ZOFRAN) 4 MG tablet Take 1 tablet (4 mg total) by mouth every 6 (six) hours as needed for nausea or vomiting. (Patient not taking: Reported on 08/07/2014) 20 tablet 0  . PHARMACIST CHOICE NO CODING test strip     . triamterene-hydrochlorothiazide (MAXZIDE-25) 37.5-25 MG per tablet Take 1 tablet by mouth every morning.      No current facility-administered medications for this visit.    REVIEW OF SYSTEMS:   Constitutional: Denies fevers, chills or abnormal night sweats Eyes: Denies blurriness of vision, double vision or watery eyes Ears, nose, mouth, throat, and face: Denies mucositis or sore throat Respiratory: Denies cough, dyspnea or wheezes Cardiovascular: Denies palpitation, chest discomfort or lower extremity swelling Gastrointestinal:  Denies nausea, heartburn or change in bowel habits Skin: Denies abnormal skin rashes Lymphatics: Denies new lymphadenopathy or easy bruising Neurological:Denies numbness, tingling or new weaknesses Behavioral/Psych: Mood is stable, no new changes  All other systems were reviewed with the patient and are negative.  PHYSICAL EXAMINATION: ECOG PERFORMANCE STATUS: 0 - Asymptomatic  Filed Vitals:   11/26/14 1022  BP: 148/82  Pulse: 20  Temp: 98.3 F (36.8 C)  Resp: 18   Filed Weights   11/26/14 1022  Weight: 214 lb 11.2 oz (97.387 kg)    GENERAL:alert, no distress and comfortable SKIN: skin color, texture, turgor are normal, no rashes or significant lesions EYES: normal, conjunctiva are pink and non-injected, sclera clear OROPHARYNX:no exudate, no erythema and lips, buccal mucosa, and tongue normal  NECK: supple, thyroid normal size, non-tender, without nodularity LYMPH:  no palpable lymphadenopathy in the cervical, axillary or inguinal LUNGS: clear to auscultation and percussion with normal breathing effort HEART: regular rate & rhythm and  no murmurs and no lower extremity edema ABDOMEN:abdomen soft, non-tender and normal bowel sounds Musculoskeletal:no cyanosis of digits and no clubbing  PSYCH: alert & oriented x 3 with fluent speech NEURO: no focal motor/sensory deficits Breasts: Breast inspection showed them to be symmetrical with no nipple discharge. Incision in the left breast has well healed. Palpation of the breasts and axilla revealed no obvious mass that I could appreciate.   LABORATORY DATA:  I have reviewed the data as listed Lab Results  Component Value Date   WBC 13.5* 10/14/2014   HGB 11.7* 10/14/2014   HCT 36.1 10/14/2014   MCV 92.1 10/14/2014   PLT 332 10/14/2014    Recent Labs  04/29/14 1525 10/09/14 1430 10/14/14 0502  NA 135 139 135  K 4.2 3.9 3.4*  CL 100 106 103  CO2 25 25 23   GLUCOSE 273* 229* 169*  BUN 14 13 16   CREATININE 0.94 0.68 0.99  CALCIUM 9.5 9.2 8.4*  GFRNONAA 66* >60 >60  GFRAA 77* >60 >60  PROT 8.4* 8.5*  --   ALBUMIN 3.9 4.1  --  AST 58* 56*  --   ALT 33 34  --   ALKPHOS 93 85  --   BILITOT 0.4 0.5  --    PATHOLOGY REPORT: Breast, biopsy, Left superior duct 04/30/2014 - ATYPICAL DUCTAL HYPERPLASIA. - INTRADUCTAL PAPILLOMA. - FIBROCYSTIC CHANGES. - SEE COMMENT. Microscopic Comment The surgical resection margin(s) of the specimen were inked and microscopically evaluated.  RADIOGRAPHIC STUDIES: I have personally reviewed the radiological images as listed and agreed with the findings in the report. No results found.  ASSESSMENT & PLAN:   59 year old postmenopausal African-American female with PMH of DM, HTN and obesity  1 left breast atypical lobular hyperplasia -We again reviewed the natural history of atypical hyperplasia. It is consider a benign breast disease, however it does increase the risk of breast cancer by 3-5 fold. It is considered as a high risk for breast cancer. -We discussed annual screening mammogram on a yearly bases, which will detect early  stage breast cancer. She agrees to continue. She is very compliant on screening. Certainly she could benefit from a 3-D mammogram. We discussed self breast examination as well as ongoing clinical examination.  -I also encourage her to have healthy diet and regular exercise,  weight loss and maintain normal BMI, calcium and vitamin D supplement, to reduce her risk of breast cancer -We further discussed chemoprevention to breast cancer. I discussed the option of tamoxifen, raloxifene and anastrozole. These endocrine therapy agent is likely going to reduce the risk of breast cancer by 30-40%, however there is no data of survival benefit so far.  -She has had hysterectomy for stage I endometrial cancer. I think tamoxifen is a good option for her. I reviewed potential side effects, such as hot flash, skin and vaginal dryness, thrombosis, slightly increased risk of cardiovascular disease, cataract, etc. she agrees to proceed. A standard prescription to her pharmacy today -I also informed her GYN oncologist Dr. Denman George about her initiating of tamoxifen.  2. Stage I endometrial cancer -She'll continue follow-up with Dr. Denman George   3. HTN, DM -She'll continue follow-up with her primary care physician  Follow-up: She will start tamoxifen in a few days. I'll see her back in 2 months with repeat lab.   All questions were answered. The patient knows to call the clinic with any problems, questions or concerns. I spent 25 minutes counseling the patient face to face. The total time spent in the appointment was 30 minutes and more than 50% was on counseling.     Truitt Merle, MD 11/26/2014 10:36 AM

## 2014-11-27 ENCOUNTER — Other Ambulatory Visit: Payer: Self-pay | Admitting: *Deleted

## 2014-11-27 DIAGNOSIS — C541 Malignant neoplasm of endometrium: Secondary | ICD-10-CM

## 2014-11-27 MED ORDER — TAMOXIFEN CITRATE 20 MG PO TABS: 20.0000 mg | ORAL_TABLET | Freq: Every day | ORAL | Status: DC

## 2015-01-22 ENCOUNTER — Telehealth: Payer: Self-pay | Admitting: Hematology

## 2015-01-22 ENCOUNTER — Encounter: Payer: Self-pay | Admitting: Hematology

## 2015-01-22 ENCOUNTER — Other Ambulatory Visit (HOSPITAL_BASED_OUTPATIENT_CLINIC_OR_DEPARTMENT_OTHER): Payer: BC Managed Care – PPO

## 2015-01-22 ENCOUNTER — Ambulatory Visit (HOSPITAL_BASED_OUTPATIENT_CLINIC_OR_DEPARTMENT_OTHER): Payer: BC Managed Care – PPO | Admitting: Hematology

## 2015-01-22 VITALS — BP 149/77 | HR 73 | Temp 98.4°F | Resp 18 | Ht 63.5 in | Wt 222.2 lb

## 2015-01-22 DIAGNOSIS — N62 Hypertrophy of breast: Secondary | ICD-10-CM

## 2015-01-22 DIAGNOSIS — C541 Malignant neoplasm of endometrium: Secondary | ICD-10-CM

## 2015-01-22 DIAGNOSIS — N6099 Unspecified benign mammary dysplasia of unspecified breast: Secondary | ICD-10-CM

## 2015-01-22 DIAGNOSIS — R7401 Elevation of levels of liver transaminase levels: Secondary | ICD-10-CM

## 2015-01-22 DIAGNOSIS — R740 Nonspecific elevation of levels of transaminase and lactic acid dehydrogenase [LDH]: Secondary | ICD-10-CM | POA: Diagnosis not present

## 2015-01-22 LAB — COMPREHENSIVE METABOLIC PANEL
ALBUMIN: 3.4 g/dL — AB (ref 3.5–5.0)
ALK PHOS: 76 U/L (ref 40–150)
ALT: 20 U/L (ref 0–55)
AST: 38 U/L — AB (ref 5–34)
Anion Gap: 11 mEq/L (ref 3–11)
BUN: 13.9 mg/dL (ref 7.0–26.0)
CO2: 21 mEq/L — ABNORMAL LOW (ref 22–29)
CREATININE: 0.9 mg/dL (ref 0.6–1.1)
Calcium: 9.4 mg/dL (ref 8.4–10.4)
Chloride: 109 mEq/L (ref 98–109)
EGFR: 80 mL/min/{1.73_m2} — ABNORMAL LOW (ref >90)
GLUCOSE: 128 mg/dL (ref 70–140)
Potassium: 3.8 mEq/L (ref 3.5–5.1)
SODIUM: 142 meq/L (ref 136–145)
TOTAL PROTEIN: 8 g/dL (ref 6.4–8.3)

## 2015-01-22 LAB — CBC WITH DIFFERENTIAL/PLATELET
BASO%: 0.9 % (ref 0.0–2.0)
Basophils Absolute: 0.1 10*3/uL (ref 0.0–0.1)
EOS ABS: 0.3 10*3/uL (ref 0.0–0.5)
EOS%: 2.6 % (ref 0.0–7.0)
HEMATOCRIT: 37.8 % (ref 34.8–46.6)
HEMOGLOBIN: 12.4 g/dL (ref 11.6–15.9)
LYMPH#: 3.2 10*3/uL (ref 0.9–3.3)
LYMPH%: 31.5 % (ref 14.0–49.7)
MCH: 30.4 pg (ref 25.1–34.0)
MCHC: 32.9 g/dL (ref 31.5–36.0)
MCV: 92.3 fL (ref 79.5–101.0)
MONO#: 0.4 10*3/uL (ref 0.1–0.9)
MONO%: 3.6 % (ref 0.0–14.0)
NEUT%: 61.4 % (ref 38.4–76.8)
NEUTROS ABS: 6.2 10*3/uL (ref 1.5–6.5)
Platelets: 350 10*3/uL (ref 145–400)
RBC: 4.09 10*6/uL (ref 3.70–5.45)
RDW: 14.1 % (ref 11.2–14.5)
WBC: 10.1 10*3/uL (ref 3.9–10.3)

## 2015-01-22 MED ORDER — TAMOXIFEN CITRATE 20 MG PO TABS: 20.0000 mg | ORAL_TABLET | Freq: Every day | ORAL | Status: DC

## 2015-01-22 NOTE — Progress Notes (Signed)
[facility]  Telephone:(336) [phone] Fax:(336) [phone]  Clinic follow Up Note   Patient Care Team: [name], MD as PCP - General (Family Medicine) Everitt Amber, MD as Consulting Physician (Obstetrics and Gynecology) 01/22/2015  CHIEF COMPLAINTS:  Follow up left breast atypical ductal hyperplasia  HISTORY OF PRESENTING ILLNESS:  Monica Clark 59 y.o. female is here because of recently diagnosed left breast ADH, she presents to our high risk breast cancer clinic to discuss risk reduction management.  She has been having left nipple discharge for the past 1 year. She had multiple mammograms, and underwent ductogram on 03/04/2014, which showed dilatation of the terminal duct. She was referred to general surgeon Dr. Leeroy Bock, and underwent excision of left nipple ducts and left breast biopsy on 04/30/2014. The final surgical path showed ductal papilloma and a typical ductal hyperplasia.  She has recovered well from the surgery. She had normal breast discharge. The surgical pain has nearly resolved and she is doing well overall. She denies any significant pain, or other symptoms.  She had menopause at age of 42. She has been having vaginal spotting in the past 4-5 years. She had multiple endometrial biopsy which was negative. She is on provera.   GYN HISTORY  Menarchal: 12 LMP: 47 Contraceptive: no HRT: yes G3P3, she did breast feeding to her children:  CURRENT THERAPY: Tamoxifen 20 mg daily, started on 11/30/2014  INTERIM HISTORY Monica Clark returns for follow-up. She has been taking tamoxifen daily for the past 2 months. She tolerated well overall, she still has mild-to-moderate hot flash, slightly worse after she started tamoxifen, but overall manageable. She denies any other noticeable side effects. She denies pain, dyspnea, GI or other symptoms. She has good appetite and eating well.  MEDICAL HISTORY:  Past Medical History  Diagnosis Date  . Nipple discharge 04/2014     left breast  . Breast mass, left     duct mass  . Hypertension     states under control with med., has been on med. x 5 yr.  . Endometrial hyperplasia   . Immature cataract   . Non-insulin dependent type 2 diabetes mellitus ([place])   . Heart murmur     states no known problems, has never seen a cardiologist  . 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 Cesarean section  1982  . Cholecystectomy  1989  . Breast ductal system excision Left 04/30/2014    Procedure: LEFT NIPPLE DUCT EXCISION;  Surgeon: Jackolyn Confer, MD;  Location: [facility];  Service: General;  Laterality: Left;  . Breast biopsy Left 04/30/2014    Procedure: LEFT BREAST BIOPSY;  Surgeon: Jackolyn Confer, MD;  Location: [facility];  Service: General;  Laterality: Left;  . Robotic assisted total hysterectomy with bilateral salpingo oopherectomy Bilateral 10/13/2014    Procedure: ROBOTIC ASSISTED TOTAL HYSTERECTOMY WITH BILATERAL SALPINGO OOPHORECTOMY ;  Surgeon: Everitt Amber, MD;  [facility];  Service: Gynecology;  Laterality: Bilateral;    SOCIAL HISTORY: Social History   Social History  . Marital Status: Single    Spouse Name: N/A  . Number of Children: N/A  . Years of Education: N/A   Occupational History  . Not on file.   Social History Main Topics  . Smoking status: Never Smoker   . Smokeless tobacco: Never Used  . Alcohol Use: Yes     Comment: rarely  . Drug Use: No  .  Sexual Activity: Not on file   Other Topics Concern  . Not on file   Social History Narrative    FAMILY HISTORY: Family History  Problem Relation Age of Onset  . Diabetes Mother   . Heart disease Father   . Cancer Father     throat cancer   . Diabetes Sister   . Diabetes Brother     ALLERGIES:  is allergic to codeine.  MEDICATIONS:  Current Outpatient Prescriptions  Medication Sig Dispense Refill  . glimepiride  (AMARYL) 4 MG tablet Take 4 mg by mouth daily with breakfast.    . metFORMIN (GLUCOPHAGE) 1000 MG tablet Take 1,000 mg by mouth 2 (two) times daily.     Marland Kitchen omeprazole (PRILOSEC) 40 MG capsule Take 40 mg by mouth as needed.     . ondansetron (ZOFRAN) 4 MG tablet Take 1 tablet (4 mg total) by mouth every 6 (six) hours as needed for nausea or vomiting. 20 tablet 0  . PHARMACIST CHOICE NO CODING test strip     . tamoxifen (NOLVADEX) 20 MG tablet Take 1 tablet (20 mg total) by mouth daily. 30 tablet 5  . triamterene-hydrochlorothiazide (MAXZIDE-25) 37.5-25 MG per tablet Take 1 tablet by mouth every morning.      No current facility-administered medications for this visit.    REVIEW OF SYSTEMS:   Constitutional: Denies fevers, chills or abnormal night sweats Eyes: Denies blurriness of vision, double vision or watery eyes Ears, nose, mouth, throat, and face: Denies mucositis or sore throat Respiratory: Denies cough, dyspnea or wheezes Cardiovascular: Denies palpitation, chest discomfort or lower extremity swelling Gastrointestinal:  Denies nausea, heartburn or change in bowel habits Skin: Denies abnormal skin rashes Lymphatics: Denies new lymphadenopathy or easy bruising Neurological:Denies numbness, tingling or new weaknesses Behavioral/Psych: Mood is stable, no new changes  All other systems were reviewed with the patient and are negative.  PHYSICAL EXAMINATION: ECOG PERFORMANCE STATUS: 0 - Asymptomatic  Filed Vitals:   01/22/15 1442  BP: 149/77  Pulse: 73  Temp: 98.4 F (36.9 C)  Resp: 18   Filed Weights   01/22/15 1442  Weight: 222 lb 3.2 oz (100.789 kg)    GENERAL:alert, no distress and comfortable SKIN: skin color, texture, turgor are normal, no rashes or significant lesions EYES: normal, conjunctiva are pink and non-injected, sclera clear OROPHARYNX:no exudate, no erythema and lips, buccal mucosa, and tongue normal  NECK: supple, thyroid normal size, non-tender, without  nodularity LYMPH:  no palpable lymphadenopathy in the cervical, axillary or inguinal LUNGS: clear to auscultation and percussion with normal breathing effort HEART: regular rate & rhythm and no murmurs and no lower extremity edema ABDOMEN:abdomen soft, non-tender and normal bowel sounds Musculoskeletal:no cyanosis of digits and no clubbing  PSYCH: alert & oriented x 3 with fluent speech NEURO: no focal motor/sensory deficits Breasts: Breast inspection showed them to be symmetrical with no nipple discharge. Incision in the left breast has well healed. Palpation of the breasts and axilla revealed no obvious mass that I could appreciate.   LABORATORY DATA:  I have reviewed the data as listed Lab Results  Component Value Date   WBC 10.1 01/22/2015   HGB 12.4 01/22/2015   HCT 37.8 01/22/2015   MCV 92.3 01/22/2015   PLT 350 01/22/2015    Recent Labs  04/29/14 1525 10/09/14 1430 10/14/14 0502 01/22/15 1349  NA 135 139 135 142  K 4.2 3.9 3.4* 3.8  CL 100 106 103  --   CO2 25 25 23  21*  GLUCOSE 273* 229* 169* 128  BUN 14 13 16  13.9  CREATININE 0.94 0.68 0.99 0.9  CALCIUM 9.5 9.2 8.4* 9.4  GFRNONAA 66* >60 >60  --   GFRAA 77* >60 >60  --   PROT 8.4* 8.5*  --  8.0  ALBUMIN 3.9 4.1  --  3.4*  AST 58* 56*  --  38*  ALT 33 34  --  20  ALKPHOS 93 85  --  76  BILITOT 0.4 0.5  --  <0.30   PATHOLOGY REPORT: Breast, biopsy, Left superior duct 04/30/2014 - ATYPICAL DUCTAL HYPERPLASIA. - INTRADUCTAL PAPILLOMA. - FIBROCYSTIC CHANGES. - SEE COMMENT. Microscopic Comment The surgical resection margin(s) of the specimen were inked and microscopically evaluated.  RADIOGRAPHIC STUDIES: I have personally reviewed the radiological images as listed and agreed with the findings in the report. No results found.  ASSESSMENT & PLAN:   59 year old postmenopausal African-American female with PMH of DM, HTN and obesity  1 left breast atypical lobular hyperplasia -She is on chemoprevention  with tamoxifen, tolerating well, we'll continue for total 5 years. -Potential side effects of tamoxifen were reviewed with her again, especially increased risk of thrombosis, and the strategy to prevent thrombosis. She has had hysterectomy, no risk for endometrial cancer. -She'll continue annual screening mammogram with 3-D, she is very compliant. I also encouraged her to continue self exam, and see [place] every 6 months for physical exam. -I also encourage her to have healthy diet and regular exercise,  weight loss and maintain normal BMI, calcium and vitamin D supplement, to reduce her risk of breast cancer -She is clinically doing well, we'll continue surveillance. -Lab results with her today.  2. Stage I endometrial cancer -She'll continue follow-up with Dr. Denman George   3. HTN, DM -She'll continue follow-up with her primary care physician  4. Transaminitis -She has been having slightly elevated AST, even before she started tamoxifen. She had ultrasound several years ago which revealed fatty liver, which is likely the etiology of her elevated AST. -Her AST is 38 today, lower than before -I encouraged her eat a healthy and try to lose some weight, she has been working on diet and weight loss lately.  Plan -repeat bilateral screening 3-D mammogram in January 2017 -continue tamoxifen, I refilled for her today  -I'll see her back in 6 months with repeat lab.   All questions were answered. The patient knows to call the clinic with any problems, questions or concerns. I spent 25 minutes counseling the patient face to face. The total time spent in the appointment was 30 minutes and more than 50% was on counseling.     Truitt Merle, MD 01/22/2015 8:42 PM

## 2015-01-22 NOTE — Telephone Encounter (Signed)
per pof to sch pt appt-sch mamma-gave pt copy of avs °

## 2015-07-05 ENCOUNTER — Telehealth: Payer: Self-pay | Admitting: *Deleted

## 2015-07-05 NOTE — Telephone Encounter (Signed)
Received call from pt requesting to talk to nurse.   Spoke with pt and was informed that pt could not schedule her mammogram due to [facility] center was not clear which breast and whether pt had surgery or not.   Faxed last office notes from Dr. Burr Medico to  [facility] mammography.  Instructed pt to call and schedule mammogram in the am. Pt's   Phone      [phone].

## 2015-07-05 NOTE — Telephone Encounter (Signed)
[place]    Fax     [phone].

## 2015-07-26 ENCOUNTER — Other Ambulatory Visit: Payer: BC Managed Care – PPO

## 2015-07-26 ENCOUNTER — Ambulatory Visit: Payer: BC Managed Care – PPO | Admitting: Hematology

## 2015-07-27 ENCOUNTER — Other Ambulatory Visit (HOSPITAL_BASED_OUTPATIENT_CLINIC_OR_DEPARTMENT_OTHER): Payer: BC Managed Care – PPO

## 2015-07-27 ENCOUNTER — Ambulatory Visit (HOSPITAL_BASED_OUTPATIENT_CLINIC_OR_DEPARTMENT_OTHER): Payer: BC Managed Care – PPO | Admitting: Hematology

## 2015-07-27 ENCOUNTER — Encounter: Payer: Self-pay | Admitting: Hematology

## 2015-07-27 ENCOUNTER — Telehealth: Payer: Self-pay | Admitting: Hematology

## 2015-07-27 VITALS — BP 145/69 | HR 79 | Temp 98.2°F | Resp 18 | Ht 63.5 in | Wt 215.4 lb

## 2015-07-27 DIAGNOSIS — C541 Malignant neoplasm of endometrium: Secondary | ICD-10-CM

## 2015-07-27 DIAGNOSIS — N62 Hypertrophy of breast: Secondary | ICD-10-CM

## 2015-07-27 DIAGNOSIS — N6099 Unspecified benign mammary dysplasia of unspecified breast: Secondary | ICD-10-CM

## 2015-07-27 DIAGNOSIS — E119 Type 2 diabetes mellitus without complications: Secondary | ICD-10-CM | POA: Diagnosis not present

## 2015-07-27 DIAGNOSIS — R740 Nonspecific elevation of levels of transaminase and lactic acid dehydrogenase [LDH]: Secondary | ICD-10-CM

## 2015-07-27 DIAGNOSIS — I10 Essential (primary) hypertension: Secondary | ICD-10-CM

## 2015-07-27 LAB — CBC WITH DIFFERENTIAL/PLATELET
BASO%: 0.3 % (ref 0.0–2.0)
Basophils Absolute: 0 10*3/uL (ref 0.0–0.1)
EOS%: 2.6 % (ref 0.0–7.0)
Eosinophils Absolute: 0.2 10*3/uL (ref 0.0–0.5)
HEMATOCRIT: 38.7 % (ref 34.8–46.6)
HGB: 12.8 g/dL (ref 11.6–15.9)
LYMPH#: 2.4 10*3/uL (ref 0.9–3.3)
LYMPH%: 25.8 % (ref 14.0–49.7)
MCH: 30.7 pg (ref 25.1–34.0)
MCHC: 33.1 g/dL (ref 31.5–36.0)
MCV: 92.8 fL (ref 79.5–101.0)
MONO#: 0.3 10*3/uL (ref 0.1–0.9)
MONO%: 3.1 % (ref 0.0–14.0)
NEUT%: 68.2 % (ref 38.4–76.8)
NEUTROS ABS: 6.3 10*3/uL (ref 1.5–6.5)
PLATELETS: 299 10*3/uL (ref 145–400)
RBC: 4.17 10*6/uL (ref 3.70–5.45)
RDW: 13.5 % (ref 11.2–14.5)
WBC: 9.3 10*3/uL (ref 3.9–10.3)

## 2015-07-27 LAB — COMPREHENSIVE METABOLIC PANEL
ALT: 41 U/L (ref 0–55)
ANION GAP: 10 meq/L (ref 3–11)
AST: 64 U/L — ABNORMAL HIGH (ref 5–34)
Albumin: 3.5 g/dL (ref 3.5–5.0)
Alkaline Phosphatase: 93 U/L (ref 40–150)
BILIRUBIN TOTAL: 0.42 mg/dL (ref 0.20–1.20)
BUN: 9.5 mg/dL (ref 7.0–26.0)
CO2: 25 meq/L (ref 22–29)
CREATININE: 0.9 mg/dL (ref 0.6–1.1)
Calcium: 9.7 mg/dL (ref 8.4–10.4)
Chloride: 105 mEq/L (ref 98–109)
EGFR: 80 mL/min/{1.73_m2} — ABNORMAL LOW (ref >90)
Glucose: 199 mg/dl — ABNORMAL HIGH (ref 70–140)
Potassium: 4.1 mEq/L (ref 3.5–5.1)
Sodium: 140 mEq/L (ref 136–145)
TOTAL PROTEIN: 8 g/dL (ref 6.4–8.3)

## 2015-07-27 NOTE — Progress Notes (Signed)
[place]  [phone] [phone]  Clinic follow Up Note   Patient Care Team: [name], MD as PCP - General (Family Medicine) Everitt Amber, MD as Consulting Physician (Obstetrics and Gynecology) 07/27/2015  CHIEF COMPLAINTS:  Follow up left breast atypical ductal hyperplasia  HISTORY OF PRESENTING ILLNESS:  Monica Clark 60 y.o. female is here because of recently diagnosed left breast ADH, she presents to our high risk breast cancer clinic to discuss risk reduction management.  She has been having left nipple discharge for the past 1 year. She had multiple mammograms, and underwent ductogram on 03/04/2014, which showed dilatation of the terminal duct. She was referred to general surgeon Dr. Leeroy Bock, and underwent excision of left nipple ducts and left breast biopsy on 04/30/2014. The final surgical path showed ductal papilloma and a typical ductal hyperplasia.  She has recovered well from the surgery. She had normal breast discharge. The surgical pain has nearly resolved and she is doing well overall. She denies any significant pain, or other symptoms.  She had menopause at age of 76. She has been having vaginal spotting in the past 4-5 years. She had multiple endometrial biopsy which was negative. She is on provera.   GYN HISTORY  Menarchal: 12 LMP: 47 Contraceptive: no HRT: yes G3P3, she did breast feeding to her children:  CURRENT THERAPY: Tamoxifen 20 mg daily, started on 11/30/2014  INTERIM HISTORY Jamiesha returns for follow-up. She has been taking tamoxifen daily, tolerating very well, her hot flush has improved, mild now, manageable, no other side effects, she is concerned about the breast pain, which has been going on since her breast surgery, intermittent, sometimes shooting pain, sometime last about 4 hour period resolves on its own. No skin change or tenderness. Her blood sugar has been slightly worse lately day, she was referred to  see her endocrinologist in the next few weeks. Her weight has been overall stable.  MEDICAL HISTORY:  Past Medical History  Diagnosis Date  . Nipple discharge 04/2014    left breast  . Breast mass, left     duct mass  . Hypertension     states under control with med., has been on med. x 5 yr.  . Endometrial hyperplasia   . Immature cataract   . Non-insulin dependent type 2 diabetes mellitus ([place])   . Heart murmur     states no known problems, has never seen a cardiologist  . 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 Cesarean section  1982  . Cholecystectomy  1989  . Breast ductal system excision Left 04/30/2014    Procedure: LEFT NIPPLE DUCT EXCISION;  Surgeon: Jackolyn Confer, MD;  Location: [facility];  Service: General;  Laterality: Left;  . Breast biopsy Left 04/30/2014    Procedure: LEFT BREAST BIOPSY;  Surgeon: Jackolyn Confer, MD;  Location: [facility];  Service: General;  Laterality: Left;  . Robotic assisted total hysterectomy with bilateral salpingo oopherectomy Bilateral 10/13/2014    Procedure: ROBOTIC ASSISTED TOTAL HYSTERECTOMY WITH BILATERAL SALPINGO OOPHORECTOMY ;  Surgeon: Everitt Amber, MD;  Location: [facility];  Service: Gynecology;  Laterality: Bilateral;    SOCIAL HISTORY: Social History   Social History  . Marital Status: Single    Spouse Name: N/A  . Number of Children: N/A  . Years of Education: N/A   Occupational History  . Not on file.   Social History  Main Topics  . Smoking status: Never Smoker   . Smokeless tobacco: Never Used  . Alcohol Use: Yes     Comment: rarely  . Drug Use: No  . Sexual Activity: Not on file   Other Topics Concern  . Not on file   Social History Narrative    FAMILY HISTORY: Family History  Problem Relation Age of Onset  . Diabetes Mother   . Heart disease Father   . Cancer Father     throat cancer   . Diabetes  Sister   . Diabetes Brother     ALLERGIES:  is allergic to codeine.  MEDICATIONS:  Current Outpatient Prescriptions  Medication Sig Dispense Refill  . glimepiride (AMARYL) 4 MG tablet Take 4 mg by mouth daily with breakfast.    . metFORMIN (GLUCOPHAGE) 1000 MG tablet Take 1,000 mg by mouth 2 (two) times daily.     Marland Kitchen PHARMACIST CHOICE NO CODING test strip     . tamoxifen (NOLVADEX) 20 MG tablet Take 1 tablet (20 mg total) by mouth daily. 30 tablet 5  . triamterene-hydrochlorothiazide (MAXZIDE-25) 37.5-25 MG per tablet Take 1 tablet by mouth every morning.     Marland Kitchen omeprazole (PRILOSEC) 40 MG capsule Take 40 mg by mouth as needed. Reported on 07/27/2015    . ondansetron (ZOFRAN) 4 MG tablet Take 1 tablet (4 mg total) by mouth every 6 (six) hours as needed for nausea or vomiting. (Patient not taking: Reported on 07/27/2015) 20 tablet 0   No current facility-administered medications for this visit.    REVIEW OF SYSTEMS:   Constitutional: Denies fevers, chills or abnormal night sweats Eyes: Denies blurriness of vision, double vision or watery eyes Ears, nose, mouth, throat, and face: Denies mucositis or sore throat Respiratory: Denies cough, dyspnea or wheezes Cardiovascular: Denies palpitation, chest discomfort or lower extremity swelling Gastrointestinal:  Denies nausea, heartburn or change in bowel habits Skin: Denies abnormal skin rashes Lymphatics: Denies new lymphadenopathy or easy bruising Neurological:Denies numbness, tingling or new weaknesses Behavioral/Psych: Mood is stable, no new changes  All other systems were reviewed with the patient and are negative.  PHYSICAL EXAMINATION: ECOG PERFORMANCE STATUS: 0 - Asymptomatic  Filed Vitals:   07/27/15 1007  BP: 145/69  Pulse: 79  Temp: 98.2 F (36.8 C)  Resp: 18   Filed Weights   07/27/15 1007  Weight: 215 lb 6.4 oz (97.705 kg)    GENERAL:alert, no distress and comfortable SKIN: skin color, texture, turgor are normal, no  rashes or significant lesions EYES: normal, conjunctiva are pink and non-injected, sclera clear OROPHARYNX:no exudate, no erythema and lips, buccal mucosa, and tongue normal  NECK: supple, thyroid normal size, non-tender, without nodularity LYMPH:  no palpable lymphadenopathy in the cervical, axillary or inguinal LUNGS: clear to auscultation and percussion with normal breathing effort HEART: regular rate & rhythm and no murmurs and no lower extremity edema ABDOMEN:abdomen soft, non-tender and normal bowel sounds Musculoskeletal:no cyanosis of digits and no clubbing  PSYCH: alert & oriented x 3 with fluent speech NEURO: no focal motor/sensory deficits Breasts: Breast inspection showed them to be symmetrical with no nipple discharge. Incision in the left breast has well healed. Palpation of the breasts and axilla revealed no obvious mass that I could appreciate.   LABORATORY DATA:  I have reviewed the data as listed CBC Latest Ref Rng 07/27/2015 01/22/2015 10/14/2014  WBC 3.9 - 10.3 10e3/uL 9.3 10.1 13.5(H)  Hemoglobin 11.6 - 15.9 g/dL 12.8 12.4 11.7(L)  Hematocrit 34.8 - 46.6 %  38.7 37.8 36.1  Platelets 145 - 400 10e3/uL 299 350 332    CMP Latest Ref Rng 07/27/2015 01/22/2015 10/14/2014  Glucose 70 - 140 mg/dl 199(H) 128 169(H)  BUN 7.0 - 26.0 mg/dL 9.5 13.9 16  Creatinine 0.6 - 1.1 mg/dL 0.9 0.9 0.99  Sodium 136 - 145 mEq/L 140 142 135  Potassium 3.5 - 5.1 mEq/L 4.1 3.8 3.4(L)  Chloride 101 - 111 mmol/L - - 103  CO2 22 - 29 mEq/L 25 21(L) 23  Calcium 8.4 - 10.4 mg/dL 9.7 9.4 8.4(L)  Total Protein 6.4 - 8.3 g/dL 8.0 8.0 -  Total Bilirubin 0.20 - 1.20 mg/dL 0.42 <0.30 -  Alkaline Phos 40 - 150 U/L 93 76 -  AST 5 - 34 U/L 64(H) 38(H) -  ALT 0 - 55 U/L 41 20 -     PATHOLOGY REPORT: Breast, biopsy, Left superior duct 04/30/2014 - ATYPICAL DUCTAL HYPERPLASIA. - INTRADUCTAL PAPILLOMA. - FIBROCYSTIC CHANGES. - SEE COMMENT. Microscopic Comment The surgical resection margin(s) of the  specimen were inked and microscopically evaluated.  RADIOGRAPHIC STUDIES: I have personally reviewed the radiological images as listed and agreed with the findings in the report. No results found.  ASSESSMENT & PLAN:   60 year old postmenopausal African-American female with PMH of DM, HTN and obesity  1 left breast atypical lobular hyperplasia -She is on chemoprevention with tamoxifen, tolerating well, we'll continue for total 5 years. -Potential side effects of tamoxifen were reviewed with her again, especially increased risk of thrombosis, and the strategy to prevent thrombosis. She has had hysterectomy, no risk for endometrial cancer. -She'll continue annual screening mammogram with 3-D, she is very compliant. I also encouraged her to continue self exam, and see [place] every 6 months for physical exam. -She is clinically doing very well, her last mammogram and breast exam today are unremarkable. -I also encourage her to have healthy diet and regular exercise,  weight loss and maintain normal BMI,  to reduce her risk of breast cancer -She is clinically doing well, we'll continue surveillance. -Lab results with her today.  2. Stage I endometrial cancer -She'll continue follow-up with Dr. Denman George   3. HTN, DM -She'll continue follow-up with her primary care physician -Her diabetes has gotten worse since she started tamoxifen, possible related, I encouraged her follow-up with her endocrinologist, eat healthy and exercise regularly.  4. Transaminitis -She has been having slightly elevated AST, even before she started tamoxifen. She had ultrasound several years ago which revealed fatty liver, which is likely the etiology of her elevated AST. -Her AST is 64 today, overall stable  -I encouraged her eat a healthy and try to lose some weight, she has been working on diet and weight loss lately.  Plan -continue tamoxifen -She will try to loose some weight through diet and exercise -I'll see her  back in 6 months with repeat lab.   All questions were answered. The patient knows to call the clinic with any problems, questions or concerns. I spent 25 minutes counseling the patient face to face. The total time spent in the appointment was 30 minutes and more than 50% was on counseling.     Truitt Merle, MD 07/27/2015

## 2015-07-27 NOTE — Telephone Encounter (Signed)
Gave pt calender & avs °

## 2015-08-04 ENCOUNTER — Ambulatory Visit: Payer: BC Managed Care – PPO | Admitting: Endocrinology

## 2015-09-03 ENCOUNTER — Ambulatory Visit (INDEPENDENT_AMBULATORY_CARE_PROVIDER_SITE_OTHER): Payer: BC Managed Care – PPO | Admitting: Endocrinology

## 2015-09-03 ENCOUNTER — Telehealth: Payer: Self-pay | Admitting: Endocrinology

## 2015-09-03 ENCOUNTER — Encounter: Payer: Self-pay | Admitting: Endocrinology

## 2015-09-03 ENCOUNTER — Other Ambulatory Visit: Payer: Self-pay

## 2015-09-03 ENCOUNTER — Encounter: Payer: Self-pay | Admitting: Dietician

## 2015-09-03 VITALS — BP 124/72 | HR 93 | Wt 215.0 lb

## 2015-09-03 DIAGNOSIS — E1165 Type 2 diabetes mellitus with hyperglycemia: Secondary | ICD-10-CM | POA: Diagnosis not present

## 2015-09-03 MED ORDER — DULAGLUTIDE 0.75 MG/0.5ML ~~LOC~~ SOAJ: SUBCUTANEOUS | Status: DC

## 2015-09-03 MED ORDER — EMPAGLIFLOZIN 10 MG PO TABS: 10.0000 mg | ORAL_TABLET | Freq: Every day | ORAL | Status: DC

## 2015-09-03 MED ORDER — GLUCOSE BLOOD VI STRP: ORAL_STRIP | Status: DC

## 2015-09-03 NOTE — Telephone Encounter (Signed)
[facility] called they need specific brand of testing strips and frequency faxed in on the script

## 2015-09-03 NOTE — Progress Notes (Signed)
Patient ID: Monica Clark, female   DOB: [birth year], 58 y.o.   MRN: [id number]           Reason for Appointment: Consultation for Type 2 Diabetes  Referring physician: [name]   History of Present Illness:          Date of diagnosis of type 2 diabetes mellitus: 2010       Background history:   She is not clear when her diabetes for diagnosis but had a high A1c in 2010 She has been on various oral medications would last few years including Januvia which she is not taking now  Amaryl was probably started in 2015 in addition to metformin She thinks her control was good only in the first few years, A1c 2016 was 9.5  Recent history:       She was referred here because of a high A1c of 10.6 in 5/17  Non-insulin hypoglycemic drugs the patient is taking are: Metformin 1 g twice a day, Amaryl 4 mg daily  Current management, blood sugar patterns and problems identified:  She is checking blood sugars very infrequently and only in the morning, does not know what the readings are usually.  She has an old meter and she does not think it gives readings every time  She was told to eat a lot of fruit and vegetables and she is eating large portions of fruit several times a day  For sometime has not exercised because of left heel pain.  She tends to get mild diarrhea from metformin          Side effects from medications have been: Mild diarrhea from regular metformin  Compliance with the medical regimen: Fair Hypoglycemia: None    Glucose monitoring:  done 0-1  times a day         Glucometer:  Accu-Chek  Blood Glucose readings by time of day and averages from meter download:  PREMEAL Breakfast Lunch Dinner Bedtime  Overall   Glucose range: About 185      Median:        Self-care: The diet that the patient has been following is: tries to limit carbs.     Meal times are:  Breakfast is at Lunch: Dinner:   Typical meal intake: Breakfast is Egg, fruit.  Lunch is chicken, vegetables,  fruit and dinner is starch, vegetative illness, meat and fruit.  Snacks: Granola bar and fruit               Dietician visit, most recent: 2013               Exercise: none, complaining of heel pain     Weight history:209-229  Wt Readings from Last 3 Encounters:  09/03/15 215 lb (97.523 kg)  07/27/15 215 lb 6.4 oz (97.705 kg)  01/22/15 222 lb 3.2 oz (100.789 kg)    Glycemic control:   Lab Results  Component Value Date   HGBA1C * 10/24/2008    7.2 (NOTE) The ADA recommends the following therapeutic goal for glycemic control related to Hgb A1c measurement: Goal of therapy: <6.5 Hgb A1c  Reference: American Diabetes Association: Clinical Practice Recommendations 2010, Diabetes Care, 2010, 33: (Suppl  1).   Lab Results  Component Value Date   LDLCALC * 10/24/2008    157        Total Cholesterol/HDL:CHD Risk Coronary Heart Disease Risk Table  Men   Women  1/2 Average Risk   3.4   3.3  Average Risk       5.0   4.4  2 X Average Risk   9.6   7.1  3 X Average Risk  23.4   11.0        Use the calculated Patient Ratio above and the CHD Risk Table to determine the patient's CHD Risk.        ATP III CLASSIFICATION (LDL):  <100     mg/dL   Optimal  100-129  mg/dL   Near or Above                    Optimal  130-159  mg/dL   Borderline  160-189  mg/dL   High  >190     mg/dL   Very High   CREATININE 0.9 07/27/2015   No results found for: MICRALBCREAT       Medication List       This list is accurate as of: 09/03/15 11:59 PM.  Always use your most recent med list.               Dulaglutide 0.75 MG/0.5ML Sopn  Commonly known as:  TRULICITY  Inject in the abdominal skin as directed once a week     empagliflozin 10 MG Tabs tablet  Commonly known as:  JARDIANCE  Take 10 mg by mouth daily with breakfast.     glimepiride 4 MG tablet  Commonly known as:  AMARYL  Take 4 mg by mouth daily with breakfast.     metFORMIN 1000 MG tablet  Commonly known  as:  GLUCOPHAGE  Take 1,000 mg by mouth 2 (two) times daily.     omeprazole 40 MG capsule  Commonly known as:  PRILOSEC  Take 40 mg by mouth as needed. Reported on 09/03/2015     ondansetron 4 MG tablet  Commonly known as:  ZOFRAN  Take 1 tablet (4 mg total) by mouth every 6 (six) hours as needed for nausea or vomiting.     PHARMACIST CHOICE NO CODING test strip  Generic drug:  glucose blood     glucose blood test strip  Commonly known as:  ONE TOUCH TEST STRIPS  Use as instructed one time a day     tamoxifen 20 MG tablet  Commonly known as:  NOLVADEX  Take 1 tablet (20 mg total) by mouth daily.     triamterene-hydrochlorothiazide 37.5-25 MG tablet  Commonly known as:  MAXZIDE-25  Take 1 tablet by mouth every morning. Reported on 09/03/2015        Allergies:  Allergies  Allergen Reactions  . Codeine Other (See Comments)    INSOMNIA    Past Medical History  Diagnosis Date  . Nipple discharge 04/2014    left breast  . Breast mass, left     duct mass  . Hypertension     states under control with med., has been on med. x 5 yr.  . Endometrial hyperplasia   . Immature cataract   . Non-insulin dependent type 2 diabetes mellitus ([place])   . Heart murmur     states no known problems, has never seen a cardiologist  . 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 Cesarean section  1982  . Cholecystectomy  1989  . Breast ductal system excision Left 04/30/2014    Procedure: LEFT NIPPLE DUCT EXCISION;  Surgeon: Jackolyn Confer, MD;  Location: [facility];  Service: General;  Laterality: Left;  . Breast biopsy Left 04/30/2014    Procedure: LEFT BREAST BIOPSY;  Surgeon: Jackolyn Confer, MD;  Location: [facility];  Service: General;  Laterality: Left;  . Robotic assisted total hysterectomy with bilateral salpingo oopherectomy Bilateral 10/13/2014    Procedure: ROBOTIC ASSISTED TOTAL HYSTERECTOMY WITH  BILATERAL SALPINGO OOPHORECTOMY ;  Surgeon: Everitt Amber, MD;  Location: [facility];  Service: Gynecology;  Laterality: Bilateral;    Family History  Problem Relation Age of Onset  . Diabetes Mother   . Heart disease Father   . Cancer Father     throat cancer   . Diabetes Sister   . Diabetes Brother     Social History:  reports that she has never smoked. She has never used smokeless tobacco. She reports that she drinks alcohol. She reports that she does not use illicit drugs.   Review of Systems  Constitutional: Negative for weight loss and reduced appetite.  HENT: Negative for headaches.   Eyes: Negative for blurred vision.  Cardiovascular: Positive for leg swelling. Negative for chest pain.       Her legs tend to swell at times and is on Maxzide  Gastrointestinal: Positive for diarrhea.       Has bowel movements 2 or 3 times a day, loose Heartburn at times treated with Prilosec  Endocrine: Positive for fatigue.  Genitourinary: Positive for nocturia.       Getting him once at night No history of recurrent cystitis or Candida vaginitis  Musculoskeletal: Positive for joint pain. Negative for back pain.       Heel pain  Skin: Negative for rash.  Psychiatric/Behavioral: Negative for insomnia.     Lipid history: LDL 126, Rx ?     Lab Results  Component Value Date   CHOL * 10/24/2008    232        ATP III CLASSIFICATION:  <200     mg/dL   Desirable  200-239  mg/dL   Borderline High  >=240    mg/dL   High          HDL 26* 10/24/2008   LDLCALC * 10/24/2008    157        Total Cholesterol/HDL:CHD Risk Coronary Heart Disease Risk Table                     Men   Women  1/2 Average Risk   3.4   3.3  Average Risk       5.0   4.4  2 X Average Risk   9.6   7.1  3 X Average Risk  23.4   11.0        Use the calculated Patient Ratio above and the CHD Risk Table to determine the patient's CHD Risk.        ATP III CLASSIFICATION (LDL):  <100     mg/dL   Optimal  100-129  mg/dL    Near or Above                    Optimal  130-159  mg/dL   Borderline  160-189  mg/dL   High  >190     mg/dL   Very High   TRIG 243* 10/24/2008   CHOLHDL 8.9 10/24/2008           Hypertension: Present, currently taking Maxzide only  Most recent eye  exam was 3/17  Most recent foot exam:    LABS:  No visits with results within 1 Week(s) from this visit. Latest known visit with results is:  Appointment on 07/27/2015  Component Date Value Ref Range Status  . WBC 07/27/2015 9.3  3.9 - 10.3 10e3/uL Final  . NEUT# 07/27/2015 6.3  1.5 - 6.5 10e3/uL Final  . HGB 07/27/2015 12.8  11.6 - 15.9 g/dL Final  . HCT 07/27/2015 38.7  34.8 - 46.6 % Final  . Platelets 07/27/2015 299  145 - 400 10e3/uL Final  . MCV 07/27/2015 92.8  79.5 - 101.0 fL Final  . MCH 07/27/2015 30.7  25.1 - 34.0 pg Final  . MCHC 07/27/2015 33.1  31.5 - 36.0 g/dL Final  . RBC 07/27/2015 4.17  3.70 - 5.45 10e6/uL Final  . RDW 07/27/2015 13.5  11.2 - 14.5 % Final  . lymph# 07/27/2015 2.4  0.9 - 3.3 10e3/uL Final  . MONO# 07/27/2015 0.3  0.1 - 0.9 10e3/uL Final  . Eosinophils Absolute 07/27/2015 0.2  0.0 - 0.5 10e3/uL Final  . Basophils Absolute 07/27/2015 0.0  0.0 - 0.1 10e3/uL Final  . NEUT% 07/27/2015 68.2  38.4 - 76.8 % Final  . LYMPH% 07/27/2015 25.8  14.0 - 49.7 % Final  . MONO% 07/27/2015 3.1  0.0 - 14.0 % Final  . EOS% 07/27/2015 2.6  0.0 - 7.0 % Final  . BASO% 07/27/2015 0.3  0.0 - 2.0 % Final  . Sodium 07/27/2015 140  136 - 145 mEq/L Final  . Potassium 07/27/2015 4.1  3.5 - 5.1 mEq/L Final  . Chloride 07/27/2015 105  98 - 109 mEq/L Final  . CO2 07/27/2015 25  22 - 29 mEq/L Final  . Glucose 07/27/2015 199* 70 - 140 mg/dl Final   Glucose reference range is for nonfasting patients. Fasting glucose reference range is 70- 100.  Marland Kitchen BUN 07/27/2015 9.5  7.0 - 26.0 mg/dL Final  . Creatinine 07/27/2015 0.9  0.6 - 1.1 mg/dL Final  . Total Bilirubin 07/27/2015 0.42  0.20 - 1.20 mg/dL Final  . Alkaline Phosphatase  07/27/2015 93  40 - 150 U/L Final  . AST 07/27/2015 64* 5 - 34 U/L Final  . ALT 07/27/2015 41  0 - 55 U/L Final  . Total Protein 07/27/2015 8.0  6.4 - 8.3 g/dL Final  . Albumin 07/27/2015 3.5  3.5 - 5.0 g/dL Final  . Calcium 07/27/2015 9.7  8.4 - 10.4 mg/dL Final  . Anion Gap 07/27/2015 10  3 - 11 mEq/L Final  . EGFR 07/27/2015 80* >90 ml/min/1.73 m2 Final   eGFR is calculated using the CKD-EPI Creatinine Equation (2009)    Physical Examination:  BP 124/72 mmHg  Pulse 93  Wt 215 lb (97.523 kg)  SpO2 97%  GENERAL:         Patient has generalized obesity.   HEENT:         Eye exam shows normal external appearance. Fundus exam shows no retinopathy . Oral exam shows normal mucosa.  She has significant dental caries .  NECK:   There is no lymphadenopathy Thyroid is not enlarged and no nodules felt.  Carotids are normal to palpation and no bruit heard LUNGS:         Chest is symmetrical. Lungs are clear to auscultation.Marland Kitchen   HEART:         Heart sounds:  S1 and S2 are normal. No murmur or click heard., no S3 or S4.   ABDOMEN:  There is no distention present. Liver and spleen are not palpable. No other mass or tenderness present.   NEUROLOGICAL:   Ankle jerks are absent bilaterally.    Diabetic Foot Exam - Simple   Simple Foot Form  Diabetic Foot exam was performed with the following findings:  Yes   Visual Inspection  No deformities, no ulcerations, no other skin breakdown bilaterally:  Yes  Sensation Testing  Intact to touch and monofilament testing bilaterally:  Yes  Pulse Check  Posterior Tibialis and Dorsalis pulse intact bilaterally:  Yes  Comments             Vibration sense Is mildly  reduced in distal first toes. MUSCULOSKELETAL:  There is no swelling or deformity of the peripheral joints. Spine is normal to inspection.   EXTREMITIES:     There is 2+ pedal edema. No skin lesions present.Marland Kitchen SKIN:       No rash or lesions of concern.        ASSESSMENT:  Diabetes type 2,  uncontrolled    She has had persistently poor control of diabetes with Amaryl and metformin only, not clear how long her A1c has been consistently over 8-9% Inadequate knowledge of diet and diabetes Currently not exercising Difficulty losing weight Since her last A1c is 10.6 it indicates significant insulin deficiency also at present She is not monitoring blood sugars much at home except occasional fasting readings, her Accu-Chek meter is giving her error messages  Complications of diabetes: None evident, last microalbumin was normal  HYPERCHOLESTEROLEMIA: LDL 126, currently not being treated with statin drug and this was discussed  PLAN:     She will be seen by the dietitian today for meal planning  She will cut back on excessive carbohydrate like fruits  She was encouraged to see the podiatrist to treat her heel pain and start walking for exercise  She will need aggressive management with additional medications that would also provide significant A1c as well as weight loss.  Discussed action of SGLT 2 drugs on lowering glucose by decreasing kidney absorption of glucose, benefits of weight loss and lower blood pressure, possible side effects including candidiasis and dosage regimen   She will start with 10 mg of Jardiance.  Discussed taking this in the morning and most likely will need a higher dose on the next visit  For now will reduce her Maxzide to half a tablet blood pressure is likely to improve with Jardiance  She will need follow-up metabolic panel on the next visit  She is also a candidate for a GLP-1 drug like Trulicity Discussed with the patient the nature of GLP-1 drugs, the action on various organ systems, how they benefit blood glucose control, as well as the benefit of weight loss and  increase satiety . Explained possible side effects especially nausea and vomiting; discussed safety information in package insert. Demonstrated the medication injection device and  injection technique to the patient. Discussed injection sites and titration of Trulicity starting with 0.75 mg once a week for the next 4 weeks. Patient brochure on Trulicity and co-pay card given May consider increasing Trulicity to 1.5 mg 1 visit  Reduce Amaryl to qd  Consider extended release metformin instead of regular metformin She will be given a new One Touch Verio monitor to start checking her blood sugars twice a day at various times, discussed blood sugar targets.  Counseling time on subjects discussed above is over 50% of today's 60 minute visit    Patient  Instructions  Check blood sugars on waking up every other day  Also check blood sugars about 2 hours after a meal and do this after different meals by rotation  Recommended blood sugar levels on waking up is 90-130 and about 2 hours after meal is 130-160  Please bring your blood sugar monitor to each visit, thank you  GLIMEPIRIDE: Stop evening dose until the next visit  JARDIANCE: Take 1 tablet in the morning  Start TRULICITYwith the pen as shown once weekly on the same day of the week.  You may inject in the stomach, thigh or arm as indicated in the brochure given.  You will feel fullness of the stomach with starting the medication and should try to keep the portions at meals small.  You may experience nausea in the first few days which usually gets better over time    If any questions or concerns are present call the office or the  [facility] at [phone]. Also visit [email or web address].com website for more useful information  Restart walking  Have only 3 small portions of fruit, about half cup each time daily         [facility] 09/04/2015, 4:35 PM   Note: This office note was prepared with Dragon voice recognition system technology. Any transcriptional errors that result from this process are unintentional.

## 2015-09-03 NOTE — Patient Instructions (Signed)
Rethink what you drink. Work on reducing the amount of added sugar. See the snack list for snack idea pairings to include carbohydrate and a small amount of protein. Reduce the amount of fruit.  Keep to 1 serving with each meal or snack and limit to 3 servings daily.  Plan:  Aim for 3 Carb Choices per meal (45 grams) +/- 1 either way  Aim for 0-1 Carbs per snack if hungry  Include protein in moderation with your meals and snacks Consider reading food labels for Total Carbohydrate and Fat Grams of foods Consider  increasing your activity level by armchair exercises for 15 minutes daily as tolerated and increase as tolerated. Consider checking BG at alternate times per day as directed by MD  Consider taking medication as directed by MD

## 2015-09-03 NOTE — Progress Notes (Signed)
Medical Nutrition Therapy:  Appt start time: 4765 end time:  1630. Patient is a walk in.  Assessment:  Primary concerns today: Patient is here alone.  She would like to learn what to substitute what to eat instead of such high amounts of fruit.  States that she has cut back on potatoes, rice, and bread.   She wants to know what to eat to give her more energy.  She will eat at times to get energy even if not hungry.  Last HgbA1C of 10% per patient.  She has a hx of type 2 diabetes since 2010 and last met with an RD in 2013.  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    Weight Hx: Lowest adult weight:  175-180 lbs in her 20's and has always struggled with her weight Highest adult weight:  229 lbs 01/22/15:  222 lbs Today:  215 lbs lost by being more careful in her meal choices.   UBW 205-209 lbs and has been unable to lose past this since children.  Patient's son lives with her.  She shops and he does most of the cooking.  She just moved and is sleeping better now at least 3 1/2 hours per night increased from 2 hours per day previously.  She has trouble going to sleep and staying asleep.  When she moved she took the TV out of her bedroom.  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  Preferred Learning Style:   No preference indicated   Learning Readiness:   Ready  MEDICATIONS: Include glimepiride and metformin.  She has diarrhea at times with metformin but had been taking on at 10 am away from a meal.  She is to start Trulicity and Jardiance after today's visit with her endocrinologist.    DIETARY INTAKE:  Usual eating pattern includes 2-3 meals and 2-3 snacks per day.  24-hr recall:  B ( AM): Coffee with flavored cream and sugar, boiled egg, snack bag of grapes or cherries or watermelon Snk ( AM): NABS or granola bar  L (  PM): salad or vegetables (leftovers) and tries not to have meat but will have small amount of grilled chicken OR eats out with the office staff at times Snk ( PM): fruit D ( PM): fast food or a bowel of cereal (plain special K or Fruit loops or Cracklin Raisin Bran)  with fruit 2% milk OR chicken or shrimp alphredo OR spaghetti or lasagne OR grilled chicken or steak, baked potato, salad Snk ( PM): ice cream or cake "I feel that I need something before bed."  Maybe another bowel of cereal. Beverages: flavored water, coffee with flavored creamer and sugar containing punch occasional, juice (often prior to bed), smoothie (1 banana, 1 1/2-2 cups frozen fruit, 1 cup greens)  Usual physical activity: unable to exercise due to flair up of Plantar Fascitis and has a gym membership but has not used  Estimated energy needs: 1400 calories 158 g carbohydrates 88 g protein 47 g fat  Progress Towards Goal(s):  In progress.   Nutritional Diagnosis:  NB-1.1 Food and nutrition-related knowledge deficit As related to balance of carbohydrates, protein, and fat.  As evidenced by diet hx.    Intervention:  Nutrition counseling and diabetes education initiated. Discussed Carb Counting by food group as method of portion control, reading  food labels, and benefits of increased activity. Also discussed basic physiology of Diabetes, target BG ranges pre and post meals, and A1c. Discussed mindful eating  Rethink what you drink. Work on reducing the amount of added sugar. See the snack list for snack idea pairings to include carbohydrate and a small amount of protein. Reduce the amount of fruit.  Keep to 1 serving with each meal or snack and limit to 3 servings daily.  Plan:  Aim for 3 Carb Choices per meal (45 grams) +/- 1 either way  Aim for 0-1 Carbs per snack if hungry  Include protein in moderation with your meals and snacks Consider reading food labels for Total Carbohydrate and Fat Grams of foods Consider   increasing your activity level by armchair exercises for 15 minutes daily as tolerated and increase as tolerated. Consider checking BG at alternate times per day as directed by MD  Consider taking medication as directed by MD  Teaching Method Utilized:  Visual Auditory Hands on  Handouts given during visit include: Living Well with Diabetes Carb Counting and Food Label handouts Meal Plan Card Label reading  Snack list  Barriers to learning/adherence to lifestyle change: busy schedule and inadequate sleep  Demonstrated degree of understanding via:  Teach Back   Monitoring/Evaluation:  Dietary intake, exercise, label reading, and body weight prn.

## 2015-09-03 NOTE — Patient Instructions (Signed)
Check blood sugars on waking up every other day  Also check blood sugars about 2 hours after a meal and do this after different meals by rotation  Recommended blood sugar levels on waking up is 90-130 and about 2 hours after meal is 130-160  Please bring your blood sugar monitor to each visit, thank you  GLIMEPIRIDE: Stop evening dose until the next visit  JARDIANCE: Take 1 tablet in the morning  Start TRULICITYwith the pen as shown once weekly on the same day of the week.  You may inject in the stomach, thigh or arm as indicated in the brochure given.  You will feel fullness of the stomach with starting the medication and should try to keep the portions at meals small.  You may experience nausea in the first few days which usually gets better over time    If any questions or concerns are present call the office or the  [facility] at [phone]. Also visit [email or web address].com website for more useful information  Restart walking  Have only 3 small portions of fruit, about half cup each time daily

## 2015-09-06 ENCOUNTER — Encounter: Payer: BC Managed Care – PPO | Attending: Endocrinology | Admitting: Dietician

## 2015-09-06 VITALS — Ht 63.0 in | Wt 215.0 lb

## 2015-09-06 DIAGNOSIS — E118 Type 2 diabetes mellitus with unspecified complications: Secondary | ICD-10-CM

## 2015-09-06 DIAGNOSIS — Z713 Dietary counseling and surveillance: Secondary | ICD-10-CM | POA: Diagnosis not present

## 2015-09-06 DIAGNOSIS — E1165 Type 2 diabetes mellitus with hyperglycemia: Secondary | ICD-10-CM | POA: Diagnosis not present

## 2015-09-06 DIAGNOSIS — IMO0002 Reserved for concepts with insufficient information to code with codable children: Secondary | ICD-10-CM

## 2015-10-05 ENCOUNTER — Ambulatory Visit: Payer: BC Managed Care – PPO | Admitting: Endocrinology

## 2015-10-12 ENCOUNTER — Other Ambulatory Visit: Payer: Self-pay | Admitting: Endocrinology

## 2015-11-01 ENCOUNTER — Ambulatory Visit: Payer: BC Managed Care – PPO | Admitting: Endocrinology

## 2015-11-25 ENCOUNTER — Ambulatory Visit: Payer: BC Managed Care – PPO | Admitting: Endocrinology

## 2016-01-04 ENCOUNTER — Other Ambulatory Visit: Payer: Self-pay | Admitting: Endocrinology

## 2016-01-21 ENCOUNTER — Encounter: Payer: Self-pay | Admitting: Endocrinology

## 2016-01-25 ENCOUNTER — Other Ambulatory Visit: Payer: BC Managed Care – PPO

## 2016-01-25 ENCOUNTER — Encounter: Payer: BC Managed Care – PPO | Admitting: Hematology

## 2016-01-25 NOTE — Progress Notes (Signed)
No show  This encounter was created in error - please disregard.

## 2016-02-11 ENCOUNTER — Other Ambulatory Visit: Payer: Self-pay | Admitting: Hematology

## 2016-02-11 ENCOUNTER — Other Ambulatory Visit: Payer: Self-pay | Admitting: Endocrinology

## 2016-02-11 ENCOUNTER — Telehealth: Payer: Self-pay | Admitting: Endocrinology

## 2016-02-11 DIAGNOSIS — C541 Malignant neoplasm of endometrium: Secondary | ICD-10-CM

## 2016-02-11 NOTE — Telephone Encounter (Signed)
Jardiance sent into [id number] must keep appt 02/2016 for further refills

## 2016-02-11 NOTE — Telephone Encounter (Signed)
Pt called and said that she needs the Jardiance and the Trulicity refilled and sent to the [facility] on [address].

## 2016-02-15 MED ORDER — DULAGLUTIDE 0.75 MG/0.5ML ~~LOC~~ SOAJ: SUBCUTANEOUS | 2 refills | Status: DC

## 2016-02-15 MED ORDER — EMPAGLIFLOZIN 10 MG PO TABS: ORAL_TABLET | ORAL | 2 refills | Status: DC

## 2016-02-15 NOTE — Telephone Encounter (Signed)
Refill submitted. 

## 2016-03-03 ENCOUNTER — Other Ambulatory Visit (HOSPITAL_BASED_OUTPATIENT_CLINIC_OR_DEPARTMENT_OTHER): Payer: BC Managed Care – PPO

## 2016-03-03 ENCOUNTER — Encounter: Payer: Self-pay | Admitting: Hematology

## 2016-03-03 ENCOUNTER — Ambulatory Visit (HOSPITAL_BASED_OUTPATIENT_CLINIC_OR_DEPARTMENT_OTHER): Payer: BC Managed Care – PPO | Admitting: Hematology

## 2016-03-03 ENCOUNTER — Telehealth: Payer: Self-pay | Admitting: Gynecologic Oncology

## 2016-03-03 VITALS — BP 139/76 | HR 78 | Temp 98.6°F | Resp 18 | Ht 63.0 in | Wt 215.0 lb

## 2016-03-03 DIAGNOSIS — N6092 Unspecified benign mammary dysplasia of left breast: Secondary | ICD-10-CM

## 2016-03-03 DIAGNOSIS — I10 Essential (primary) hypertension: Secondary | ICD-10-CM | POA: Diagnosis not present

## 2016-03-03 DIAGNOSIS — C541 Malignant neoplasm of endometrium: Secondary | ICD-10-CM | POA: Diagnosis not present

## 2016-03-03 DIAGNOSIS — E119 Type 2 diabetes mellitus without complications: Secondary | ICD-10-CM

## 2016-03-03 DIAGNOSIS — N6099 Unspecified benign mammary dysplasia of unspecified breast: Secondary | ICD-10-CM

## 2016-03-03 DIAGNOSIS — R7401 Elevation of levels of liver transaminase levels: Secondary | ICD-10-CM

## 2016-03-03 DIAGNOSIS — R740 Nonspecific elevation of levels of transaminase and lactic acid dehydrogenase [LDH]: Secondary | ICD-10-CM | POA: Diagnosis not present

## 2016-03-03 DIAGNOSIS — Z79810 Long term (current) use of selective estrogen receptor modulators (SERMs): Secondary | ICD-10-CM

## 2016-03-03 LAB — CBC WITH DIFFERENTIAL/PLATELET
BASO%: 1.4 % (ref 0.0–2.0)
Basophils Absolute: 0.1 10*3/uL (ref 0.0–0.1)
EOS ABS: 0.2 10*3/uL (ref 0.0–0.5)
EOS%: 2.4 % (ref 0.0–7.0)
HCT: 41.1 % (ref 34.8–46.6)
HGB: 13.8 g/dL (ref 11.6–15.9)
LYMPH%: 28.5 % (ref 14.0–49.7)
MCH: 30.7 pg (ref 25.1–34.0)
MCHC: 33.5 g/dL (ref 31.5–36.0)
MCV: 91.7 fL (ref 79.5–101.0)
MONO#: 0.3 10*3/uL (ref 0.1–0.9)
MONO%: 3.6 % (ref 0.0–14.0)
NEUT%: 64.1 % (ref 38.4–76.8)
NEUTROS ABS: 6.2 10*3/uL (ref 1.5–6.5)
PLATELETS: 346 10*3/uL (ref 145–400)
RBC: 4.48 10*6/uL (ref 3.70–5.45)
RDW: 14.1 % (ref 11.2–14.5)
WBC: 9.7 10*3/uL (ref 3.9–10.3)
lymph#: 2.8 10*3/uL (ref 0.9–3.3)

## 2016-03-03 LAB — COMPREHENSIVE METABOLIC PANEL
ALK PHOS: 118 U/L (ref 40–150)
ALT: 46 U/L (ref 0–55)
ANION GAP: 12 meq/L — AB (ref 3–11)
AST: 72 U/L — ABNORMAL HIGH (ref 5–34)
Albumin: 3.7 g/dL (ref 3.5–5.0)
BILIRUBIN TOTAL: 0.61 mg/dL (ref 0.20–1.20)
BUN: 8.5 mg/dL (ref 7.0–26.0)
CALCIUM: 10 mg/dL (ref 8.4–10.4)
CO2: 24 mEq/L (ref 22–29)
CREATININE: 0.8 mg/dL (ref 0.6–1.1)
Chloride: 105 mEq/L (ref 98–109)
EGFR: >90 mL/min/{1.73_m2} (ref >90)
Glucose: 113 mg/dl (ref 70–140)
Potassium: 3.5 mEq/L (ref 3.5–5.1)
Sodium: 141 mEq/L (ref 136–145)
TOTAL PROTEIN: 8.2 g/dL (ref 6.4–8.3)

## 2016-03-03 NOTE — Telephone Encounter (Signed)
Returned call to patient.  She had left a message asking when she needed to follow up with Dr. Denman George.  She was last seen in the office in 10/2014 and it was recommended at that time that she follow up in six months and with Dr. Nelda Marseille in one year.  She states she just saw Dr. Nelda Marseille on Monday.  Advised her that she would need to see Dr. Denman George six months after that so that she is being seen every six months.  Advised her to call in April or May for a July 2018 appointment.  Verbalizing understanding.  Advised to call for any needs or concerns.

## 2016-03-03 NOTE — Progress Notes (Signed)
[place]  [phone] [phone]  Clinic follow Up Note   Patient Care Team: [name], MD as PCP - General (Family Medicine) Everitt Amber, MD as Consulting Physician (Obstetrics and Gynecology) 03/03/2016  CHIEF COMPLAINTS:  Follow up left breast atypical ductal hyperplasia  HISTORY OF PRESENTING ILLNESS:  Monica Clark 61 y.o. female is here because of recently diagnosed left breast ADH, she presents to our high risk breast cancer clinic to discuss risk reduction management.  She has been having left nipple discharge for the past 1 year. She had multiple mammograms, and underwent ductogram on 03/04/2014, which showed dilatation of the terminal duct. She was referred to general surgeon Dr. Leeroy Bock, and underwent excision of left nipple ducts and left breast biopsy on 04/30/2014. The final surgical path showed ductal papilloma and a typical ductal hyperplasia.  She has recovered well from the surgery. She had normal breast discharge. The surgical pain has nearly resolved and she is doing well overall. She denies any significant pain, or other symptoms.  She had menopause at age of 44. She has been having vaginal spotting in the past 4-5 years. She had multiple endometrial biopsy which was negative. She is on provera.   GYN HISTORY  Menarchal: 12 LMP: 47 Contraceptive: no HRT: yes G3P3, she did breast feeding to her children:  CURRENT THERAPY: Tamoxifen 20 mg daily, started on 11/30/2014  INTERIM HISTORY Hli returns for follow-up. She is doing well overall. She tolerates tamoxifen well, no significant hot flashes or other side effects. She has intermittent left breast pain, which lasts a few days. Denies breast edema or skin redness. This has been going on since her breast surgery. Overall stable. Due to her hip pain, she is not very physically active, she does not exercise regularly.  MEDICAL HISTORY:  Past Medical History:  Diagnosis  Date  . Breast mass, left    duct mass  . Endometrial hyperplasia   . Heart murmur    states no known problems, has never seen a cardiologist  . Hypertension    states under control with med., has been on med. x 5 yr.  . Immature cataract   . 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 Nipple discharge 04/2014   left breast  . Non-insulin dependent type 2 diabetes mellitus ([place])     SURGICAL HISTORY: Past Surgical History:  Procedure Laterality Date  . BREAST BIOPSY Left 04/30/2014   Procedure: LEFT BREAST BIOPSY;  Surgeon: Jackolyn Confer, MD;  Location: [facility];  Service: General;  Laterality: Left;  . BREAST DUCTAL SYSTEM EXCISION Left 04/30/2014   Procedure: LEFT NIPPLE DUCT EXCISION;  Surgeon: Jackolyn Confer, MD;  Location: [facility];  Service: General;  Laterality: Left;  . [place]  . CHOLECYSTECTOMY  1989  . ROBOTIC ASSISTED TOTAL HYSTERECTOMY WITH BILATERAL SALPINGO OOPHERECTOMY Bilateral 10/13/2014   Procedure: ROBOTIC ASSISTED TOTAL HYSTERECTOMY WITH BILATERAL SALPINGO OOPHORECTOMY ;  Surgeon: Everitt Amber, MD;  Location: [facility];  Service: Gynecology;  Laterality: Bilateral;    SOCIAL HISTORY: Social History   Social History  . Marital status: Single    Spouse name: N/A  . Number of children: N/A  . Years of education: N/A   Occupational History  . Not on file.   Social History Main Topics  . Smoking status: Never Smoker  . Smokeless tobacco: Never Used  . Alcohol use Yes     Comment: rarely  . Drug use:  No  . Sexual activity: Not on file   Other Topics Concern  . Not on file   Social History Narrative  . No narrative on file    FAMILY HISTORY: Family History  Problem Relation Age of Onset  . Diabetes Mother   . Heart disease Father   . Cancer Father     throat cancer   . Diabetes Sister   . Diabetes Brother     ALLERGIES:  is allergic to codeine.  MEDICATIONS:  Current Outpatient Prescriptions    Medication Sig Dispense Refill  . amLODipine (NORVASC) 5 MG tablet Take 5 mg by mouth daily.     . Dulaglutide (TRULICITY) A999333 0000000 SOPN INJECT IN THE ABDOMINAL SKIN AS DIRECTED ONCE A WEEK 4 pen 2  . empagliflozin (JARDIANCE) 10 MG TABS tablet TAKE 1 TABLET BY MOUTH EVERY DAY WITH BREAKFAST 30 tablet 2  . glimepiride (AMARYL) 4 MG tablet Take 4 mg by mouth daily with breakfast.    . glucose blood (ONE TOUCH TEST STRIPS) test strip Use as instructed one time a day 100 each 12  . metFORMIN (GLUCOPHAGE) 1000 MG tablet Take 1,000 mg by mouth 2 (two) times daily.     Marland Kitchen PHARMACIST CHOICE NO CODING test strip     . tamoxifen (NOLVADEX) 20 MG tablet TAKE 1 TABLET EVERY DAY 30 tablet 3  . triamterene-hydrochlorothiazide (MAXZIDE-25) 37.5-25 MG per tablet Take 1 tablet by mouth every morning. Reported on 09/03/2015     No current facility-administered medications for this visit.     REVIEW OF SYSTEMS:   Constitutional: Denies fevers, chills or abnormal night sweats Eyes: Denies blurriness of vision, double vision or watery eyes Ears, nose, mouth, throat, and face: Denies mucositis or sore throat Respiratory: Denies cough, dyspnea or wheezes Cardiovascular: Denies palpitation, chest discomfort or lower extremity swelling Gastrointestinal:  Denies nausea, heartburn or change in bowel habits Skin: Denies abnormal skin rashes Lymphatics: Denies new lymphadenopathy or easy bruising Neurological:Denies numbness, tingling or new weaknesses Behavioral/Psych: Mood is stable, no new changes  All other systems were reviewed with the patient and are negative.  PHYSICAL EXAMINATION: ECOG PERFORMANCE STATUS: 0 - Asymptomatic  Vitals:   03/03/16 1323  BP: 139/76  Pulse: 78  Resp: 18  Temp: 98.6 F (37 C)   Filed Weights   03/03/16 1323  Weight: 215 lb (97.5 kg)    GENERAL:alert, no distress and comfortable SKIN: skin color, texture, turgor are normal, no rashes or significant lesions EYES:  normal, conjunctiva are pink and non-injected, sclera clear OROPHARYNX:no exudate, no erythema and lips, buccal mucosa, and tongue normal  NECK: supple, thyroid normal size, non-tender, without nodularity LYMPH:  no palpable lymphadenopathy in the cervical, axillary or inguinal LUNGS: clear to auscultation and percussion with normal breathing effort HEART: regular rate & rhythm and no murmurs and no lower extremity edema ABDOMEN:abdomen soft, non-tender and normal bowel sounds Musculoskeletal:no cyanosis of digits and no clubbing  PSYCH: alert & oriented x 3 with fluent speech NEURO: no focal motor/sensory deficits Breasts: Breast inspection showed them to be symmetrical with no nipple discharge. Incision in the left breast has well healed. Palpation of the breasts and axilla revealed no obvious mass that I could appreciate.   LABORATORY DATA:  I have reviewed the data as listed CBC Latest Ref Rng & Units 03/03/2016 07/27/2015 01/22/2015  WBC 3.9 - 10.3 10e3/uL 9.7 9.3 10.1  Hemoglobin 11.6 - 15.9 g/dL 13.8 12.8 12.4  Hematocrit 34.8 - 46.6 % 41.1  38.7 37.8  Platelets 145 - 400 10e3/uL 346 299 350    CMP Latest Ref Rng & Units 03/03/2016 07/27/2015 01/22/2015  Glucose 70 - 140 mg/dl 113 199(H) 128  BUN 7.0 - 26.0 mg/dL 8.5 9.5 13.9  Creatinine 0.6 - 1.1 mg/dL 0.8 0.9 0.9  Sodium 136 - 145 mEq/L 141 140 142  Potassium 3.5 - 5.1 mEq/L 3.5 4.1 3.8  Chloride 101 - 111 mmol/L - - -  CO2 22 - 29 mEq/L 24 25 21(L)  Calcium 8.4 - 10.4 mg/dL 10.0 9.7 9.4  Total Protein 6.4 - 8.3 g/dL 8.2 8.0 8.0  Total Bilirubin 0.20 - 1.20 mg/dL 0.61 0.42 <0.30  Alkaline Phos 40 - 150 U/L 118 93 76  AST 5 - 34 U/L 72(H) 64(H) 38(H)  ALT 0 - 55 U/L 46 41 20     PATHOLOGY REPORT: Breast, biopsy, Left superior duct 04/30/2014 - ATYPICAL DUCTAL HYPERPLASIA. - INTRADUCTAL PAPILLOMA. - FIBROCYSTIC CHANGES. - SEE COMMENT. Microscopic Comment The surgical resection margin(s) of the specimen were inked and  microscopically evaluated.  RADIOGRAPHIC STUDIES: I have personally reviewed the radiological images as listed and agreed with the findings in the report. No results found.  ASSESSMENT & PLAN:   61 y.o. postmenopausal African-American female with PMH of DM, HTN and obesity  1 left breast atypical lobular hyperplasia -She is on chemoprevention with tamoxifen, tolerating well, we'll continue for total 5 years. -Potential side effects of tamoxifen were reviewed with her again, especially increased risk of thrombosis, and the strategy to prevent thrombosis. She has had hysterectomy, no risk for endometrial cancer. -She'll continue annual screening mammogram with 3-D, she is very compliant. I also encouraged her to continue self exam, and see [place] every 6 months for physical exam. -She is clinically doing very well, her last mammogram and breast exam today are unremarkable. -Lab results reviewed with her, she still has elevated AST, slightly worse than before.  -We'll continue tamoxifen, plan for total 5 years.  2. Stage I endometrial cancer -She'll continue follow-up with Dr. Denman George   3. HTN, DM -She'll continue follow-up with her primary care physician -Her diabetes has gotten worse since she started tamoxifen, possible related, I encouraged her follow-up with her endocrinologist, eat healthy and exercise regularly.  4. Transaminitis -She has been having slightly elevated AST, even before she started tamoxifen. She had ultrasound several years ago which revealed fatty liver, which is likely the etiology of her elevated AST. -Her AST is 72 today, slightly worse than before -I'll get a repeated ultrasound to evaluate her liver -I encouraged her eat a healthy and try to lose some weight, and check lipid profile with her primary care physician  Plan -continue tamoxifen -Ultrasound of abdomen in the next 2 weeks to evaluate her liver -she is due for annual mammogram, I will order  -She will  try to loose some weight through diet and exercise -I'll see her back in 6 months with repeat lab.   All questions were answered. The patient knows to call the clinic with any problems, questions or concerns. I spent 25 minutes counseling the patient face to face. The total time spent in the appointment was 30 minutes and more than 50% was on counseling.     Truitt Merle, MD 03/03/2016

## 2016-03-11 ENCOUNTER — Telehealth: Payer: Self-pay | Admitting: Hematology

## 2016-03-11 NOTE — Telephone Encounter (Signed)
Left message re July lab/fu and contact [facility] re annual mammo. Central radiology will call re [place].

## 2016-09-01 ENCOUNTER — Encounter: Payer: BC Managed Care – PPO | Admitting: Hematology

## 2016-09-01 ENCOUNTER — Other Ambulatory Visit: Payer: BC Managed Care – PPO

## 2016-09-03 NOTE — Progress Notes (Signed)
This encounter was created in error - please disregard.

## 2016-09-04 ENCOUNTER — Telehealth: Payer: Self-pay | Admitting: Hematology

## 2016-09-04 NOTE — Telephone Encounter (Signed)
Left message for patient to call back to r/s appt missed on 7/13- per sch message did not schedule because unsure if patient would want to return  per 7/15 message - "no show on 7/13, please call pt and only rescheudle  f/u if she                  is willing to return "

## 2016-09-26 ENCOUNTER — Telehealth: Payer: Self-pay | Admitting: Endocrinology

## 2016-09-26 NOTE — Telephone Encounter (Signed)
This patient has not been seen in over a year. Last OV 09/06/2015. Please advise if okay to refill or if need to decline and state needs appointment.

## 2016-10-20 ENCOUNTER — Other Ambulatory Visit: Payer: Self-pay | Admitting: Endocrinology

## 2016-10-20 NOTE — Telephone Encounter (Signed)
Last o/v 09/03/15 and has had 3 cancelations no future appointment- refill or refuse please advise

## 2016-10-20 NOTE — Telephone Encounter (Signed)
Noted  

## 2016-10-20 NOTE — Telephone Encounter (Signed)
Needs to be seen with labs for any more prescriptions

## 2016-10-24 ENCOUNTER — Other Ambulatory Visit: Payer: Self-pay | Admitting: Family Medicine

## 2016-10-24 DIAGNOSIS — R1031 Right lower quadrant pain: Secondary | ICD-10-CM

## 2016-10-25 ENCOUNTER — Other Ambulatory Visit: Payer: Self-pay | Admitting: Endocrinology

## 2016-10-25 DIAGNOSIS — E1165 Type 2 diabetes mellitus with hyperglycemia: Secondary | ICD-10-CM

## 2016-10-26 ENCOUNTER — Other Ambulatory Visit (INDEPENDENT_AMBULATORY_CARE_PROVIDER_SITE_OTHER): Payer: BC Managed Care – PPO

## 2016-10-26 DIAGNOSIS — E1165 Type 2 diabetes mellitus with hyperglycemia: Secondary | ICD-10-CM | POA: Diagnosis not present

## 2016-10-26 LAB — COMPREHENSIVE METABOLIC PANEL
ALK PHOS: 95 U/L (ref 39–117)
ALT: 30 U/L (ref 0–35)
AST: 39 U/L — ABNORMAL HIGH (ref 0–37)
Albumin: 4.1 g/dL (ref 3.5–5.2)
BUN: 15 mg/dL (ref 6–23)
CO2: 23 mEq/L (ref 19–32)
Calcium: 9.8 mg/dL (ref 8.4–10.5)
Chloride: 103 mEq/L (ref 96–112)
Creatinine, Ser: 0.88 mg/dL (ref 0.40–1.20)
GFR: 84.42 mL/min (ref >60.00)
Glucose, Bld: 310 mg/dL — ABNORMAL HIGH (ref 70–99)
POTASSIUM: 4 meq/L (ref 3.5–5.1)
SODIUM: 139 meq/L (ref 135–145)
TOTAL PROTEIN: 8.1 g/dL (ref 6.0–8.3)
Total Bilirubin: 0.4 mg/dL (ref 0.2–1.2)

## 2016-10-26 LAB — LIPID PANEL
CHOLESTEROL: 211 mg/dL — AB (ref 0–200)
HDL: 32.7 mg/dL — ABNORMAL LOW (ref >39.00)
Total CHOL/HDL Ratio: 6
Triglycerides: 467 mg/dL — ABNORMAL HIGH (ref 0.0–149.0)

## 2016-10-26 LAB — MICROALBUMIN / CREATININE URINE RATIO
Creatinine,U: 53.8 mg/dL
MICROALB UR: 0.8 mg/dL (ref 0.0–1.9)
Microalb Creat Ratio: 1.5 mg/g (ref 0.0–30.0)

## 2016-10-26 LAB — LDL CHOLESTEROL, DIRECT: LDL DIRECT: 95 mg/dL

## 2016-10-26 LAB — HEMOGLOBIN A1C: Hgb A1c MFr Bld: 11.1 % — ABNORMAL HIGH (ref 4.6–6.5)

## 2016-11-02 ENCOUNTER — Ambulatory Visit (INDEPENDENT_AMBULATORY_CARE_PROVIDER_SITE_OTHER): Payer: BC Managed Care – PPO | Admitting: Endocrinology

## 2016-11-02 ENCOUNTER — Encounter: Payer: Self-pay | Admitting: Endocrinology

## 2016-11-02 VITALS — BP 136/64 | HR 84 | Ht 63.0 in | Wt 214.0 lb

## 2016-11-02 DIAGNOSIS — E782 Mixed hyperlipidemia: Secondary | ICD-10-CM | POA: Diagnosis not present

## 2016-11-02 DIAGNOSIS — Z23 Encounter for immunization: Secondary | ICD-10-CM

## 2016-11-02 DIAGNOSIS — I10 Essential (primary) hypertension: Secondary | ICD-10-CM | POA: Diagnosis not present

## 2016-11-02 DIAGNOSIS — E1165 Type 2 diabetes mellitus with hyperglycemia: Secondary | ICD-10-CM | POA: Diagnosis not present

## 2016-11-02 MED ORDER — SEMAGLUTIDE(0.25 OR 0.5MG/DOS) 2 MG/1.5ML ~~LOC~~ SOPN: 0.5000 mg | PEN_INJECTOR | SUBCUTANEOUS | 2 refills | Status: DC

## 2016-11-02 MED ORDER — METFORMIN HCL ER 500 MG PO TB24
2000.0000 mg | ORAL_TABLET | Freq: Every day | ORAL | 3 refills | Status: DC
Start: 2016-11-02 — End: 2019-08-05

## 2016-11-02 NOTE — Patient Instructions (Addendum)
Check blood sugars on waking up 3-4/7 days    Also check blood sugars about 2 hours after a meal and do this after different meals by rotation  Recommended blood sugar levels on waking up is 90-130 and about 2 hours after meal is 130-160  Please bring your blood sugar monitor to each visit, thank you  Take GLIMEPIRIDE with breakfast and dinner  OZEMPIC: Take weekly by starting 0.25 mg for the first 2 doses and then 0.5 mg weekly as long as there is no nausea  METFORMIN: Switch to the metformin ER preparation, start with 1 tablet daily with 2 main meal and every 4-5 days add another tablet until you are taking 4 tablets or the maximum tolerated dose  Walk daily for exercise or consider doing water aerobics Moderate portions of carbohydrates, sweets and high-fat intake

## 2016-11-02 NOTE — Progress Notes (Signed)
Patient ID: Monica Clark, female   DOB: 1955-10-06, 61 y.o.   MRN: [id number]           Reason for Appointment: Follow-up for Type 2 Diabetes  Referring physician: [name]   History of Present Illness:          Date of diagnosis of type 2 diabetes mellitus: 2010       Background history:   She is not clear when her diabetes for diagnosis but had a high A1c in 2010 She has been on various oral medications over the last few years including Januvia  Amaryl was probably started in 2015 in addition to metformin She thinks her control was good only in the first few years, A1c 2016 was 9.5  Recent history:       She has not been seen in follow-up since her initial consultation in July 2017  Her A1c is now 11.1, previously 10.6  Non-insulin hypoglycemic drugs the patient is taking are: Metformin irregularly, Amaryl 4 mg daily, Trulicity 8.41 mg weekly, Jardiance 25 mg daily  Current management, blood sugar patterns and problems identified:  She says she has difficulty with getting her meter to work and is not checking blood sugars much  Has not checked any readings for the last 2 weeks  However her fasting glucose was 310 in the lab  She was started on Trulicity over a year ago but she did not come back for follow-up and adjustment of doses  She was also started on 10 mg of Jardiance but now taking 25 mg from her PCP  She was recommended seeing the dietitian but she did not do so  Also still not exercising because of various leg and foot pains  She does not appear to have lost any weight despite using Jardiance and Trulicity       Side effects from medications have been: Mild diarrhea from regular metformin  Compliance with the medical regimen: Fair Hypoglycemia: None    Glucose monitoring:  done 0-1  times a day         Glucometer:  One Touch Verio  Blood Glucose readings by recall, range 176-250, occasionally after meals  Self-care: The diet that the patient has  been following is: tries to limit carbs.     Typical meal intake: Breakfast is Egg, fruit.  Lunch is chicken, vegetables, fruit and dinner is starch, meat and fruit.  Snacks: Granola bar and fruit               Dietician visit, most recent: 2013               Exercise: none     Weight history:209-229  Wt Readings from Last 3 Encounters:  11/02/16 214 lb (97.1 kg)  03/03/16 215 lb (97.5 kg)  09/03/15 215 lb (97.5 kg)    Glycemic control:   Lab Results  Component Value Date   HGBA1C 11.1 (H) 10/26/2016   HGBA1C (H) 10/24/2008    7.2 (NOTE) The ADA recommends the following therapeutic goal for glycemic control related to Hgb A1c measurement: Goal of therapy: <6.5 Hgb A1c  Reference: American Diabetes Association: Clinical Practice Recommendations 2010, Diabetes Care, 2010, 33: (Suppl  1).   Lab Results  Component Value Date   MICROALBUR 0.8 10/26/2016   LDLCALC (H) 10/24/2008    157        Total Cholesterol/HDL:CHD Risk Coronary Heart Disease Risk Table  Men   Women  1/2 Average Risk   3.4   3.3  Average Risk       5.0   4.4  2 X Average Risk   9.6   7.1  3 X Average Risk  23.4   11.0        Use the calculated Patient Ratio above and the CHD Risk Table to determine the patient's CHD Risk.        ATP III CLASSIFICATION (LDL):  <100     mg/dL   Optimal  100-129  mg/dL   Near or Above                    Optimal  130-159  mg/dL   Borderline  160-189  mg/dL   High  >190     mg/dL   Very High   CREATININE 0.88 10/26/2016     Lab Results  Component Value Date   MICRALBCREAT 1.5 10/26/2016       Allergies as of 11/02/2016      Reactions   Codeine Other (See Comments)   INSOMNIA      Medication List       Accurate as of 11/02/16  9:25 PM. Always use your most recent med list.          amLODipine 5 MG tablet Commonly known as:  NORVASC Take 5 mg by mouth daily.   empagliflozin 10 MG Tabs tablet Commonly known as:  JARDIANCE TAKE 1  TABLET BY MOUTH EVERY DAY WITH BREAKFAST   glimepiride 4 MG tablet Commonly known as:  AMARYL Take 4 mg by mouth daily with breakfast.   metFORMIN 500 MG 24 hr tablet Commonly known as:  GLUCOPHAGE-XR Take 4 tablets (2,000 mg total) by mouth daily with supper.   PHARMACIST CHOICE NO CODING test strip Generic drug:  glucose blood   glucose blood test strip Commonly known as:  ONE TOUCH TEST STRIPS Use as instructed one time a day   Semaglutide 0.25 or 0.5 MG/DOSE Sopn Commonly known as:  OZEMPIC Inject 0.5 mg into the skin once a week.   tamoxifen 20 MG tablet Commonly known as:  NOLVADEX TAKE 1 TABLET EVERY DAY   triamterene-hydrochlorothiazide 37.5-25 MG tablet Commonly known as:  MAXZIDE-25 Take 1 tablet by mouth every morning. Reported on 09/03/2015            Discharge Care Instructions        Start     Ordered   11/02/16 0000  metFORMIN (GLUCOPHAGE-XR) 500 MG 24 hr tablet  Daily with supper     11/02/16 1527   11/02/16 0000  Semaglutide (OZEMPIC) 0.25 or 0.5 MG/DOSE SOPN  Weekly     11/02/16 1527   11/02/16 6734  Basic metabolic panel     19/37/90 2123   11/02/16 0000  Fructosamine     11/02/16 2123      Allergies:  Allergies  Allergen Reactions  . Codeine Other (See Comments)    INSOMNIA    Past Medical History:  Diagnosis Date  . Breast mass, left    duct mass  . Endometrial hyperplasia   . Heart murmur    states no known problems, has never seen a cardiologist  . Hypertension    states under control with med., has been on med. x 5 yr.  . Immature cataract   . 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 Nipple discharge 04/2014   left breast  . Non-insulin  dependent type 2 diabetes mellitus [facility])     Past Surgical History:  Procedure Laterality Date  . BREAST BIOPSY Left 04/30/2014   Procedure: LEFT BREAST BIOPSY;  Surgeon: Jackolyn Confer, MD;  Location: [facility];  Service: General;  Laterality: Left;  . BREAST DUCTAL  SYSTEM EXCISION Left 04/30/2014   Procedure: LEFT NIPPLE DUCT EXCISION;  Surgeon: Jackolyn Confer, MD;  Location: [facility];  Service: General;  Laterality: Left;  . [place]  . CHOLECYSTECTOMY  1989  . ROBOTIC ASSISTED TOTAL HYSTERECTOMY WITH BILATERAL SALPINGO OOPHERECTOMY Bilateral 10/13/2014   Procedure: ROBOTIC ASSISTED TOTAL HYSTERECTOMY WITH BILATERAL SALPINGO OOPHORECTOMY ;  Surgeon: Everitt Amber, MD;  Location: [facility];  Service: Gynecology;  Laterality: Bilateral;    Family History  Problem Relation Age of Onset  . Diabetes Mother   . Heart disease Father   . Cancer Father        throat cancer   . Diabetes Sister   . Diabetes Brother     Social History:  reports that she has never smoked. She has never used smokeless tobacco. She reports that she drinks alcohol. She reports that she does not use drugs.   Review of Systems   Lipid history: LDL 95 Not clear if she has had any treatment from PCP recently, previously had taken pravastatin    Lab Results  Component Value Date   CHOL 211 (H) 10/26/2016   HDL 32.70 (L) 10/26/2016   LDLCALC (H) 10/24/2008    157        Total Cholesterol/HDL:CHD Risk Coronary Heart Disease Risk Table                     Men   Women  1/2 Average Risk   3.4   3.3  Average Risk       5.0   4.4  2 X Average Risk   9.6   7.1  3 X Average Risk  23.4   11.0        Use the calculated Patient Ratio above and the CHD Risk Table to determine the patient's CHD Risk.        ATP III CLASSIFICATION (LDL):  <100     mg/dL   Optimal  100-129  mg/dL   Near or Above                    Optimal  130-159  mg/dL   Borderline  160-189  mg/dL   High  >190     mg/dL   Very High   LDLDIRECT 95.0 10/26/2016   TRIG (H) 10/26/2016    467.0 Triglyceride is over 400; calculations on Lipids are invalid.   CHOLHDL 6 10/26/2016           Hypertension: Present, currently taking Maxzide And amlodipine   Most recent foot exam:  7/17    LABS:  No visits with results within 1 Week(s) from this visit.  Latest known visit with results is:  Lab on 10/26/2016  Component Date Value Ref Range Status  . Hgb A1c MFr Bld 10/26/2016 11.1* 4.6 - 6.5 % Final   Glycemic Control Guidelines for People with Diabetes:Non Diabetic:  <6%Goal of Therapy: <7%Additional Action Suggested:  >8%   . Sodium 10/26/2016 139  135 - 145 mEq/L Final  . Potassium 10/26/2016 4.0  3.5 - 5.1 mEq/L Final  . Chloride 10/26/2016 103  96 - 112 mEq/L Final  . CO2  10/26/2016 23  19 - 32 mEq/L Final  . Glucose, Bld 10/26/2016 310* 70 - 99 mg/dL Final  . BUN 10/26/2016 15  6 - 23 mg/dL Final  . Creatinine, Ser 10/26/2016 0.88  0.40 - 1.20 mg/dL Final  . Total Bilirubin 10/26/2016 0.4  0.2 - 1.2 mg/dL Final  . Alkaline Phosphatase 10/26/2016 95  39 - 117 U/L Final  . AST 10/26/2016 39* 0 - 37 U/L Final  . ALT 10/26/2016 30  0 - 35 U/L Final  . Total Protein 10/26/2016 8.1  6.0 - 8.3 g/dL Final  . Albumin 10/26/2016 4.1  3.5 - 5.2 g/dL Final  . Calcium 10/26/2016 9.8  8.4 - 10.5 mg/dL Final  . GFR 10/26/2016 84.42  >60.00 mL/min Final  . Microalb, Ur 10/26/2016 0.8  0.0 - 1.9 mg/dL Final  . Creatinine,U 10/26/2016 53.8  mg/dL Final  . Microalb Creat Ratio 10/26/2016 1.5  0.0 - 30.0 mg/g Final  . Cholesterol 10/26/2016 211* 0 - 200 mg/dL Final   ATP III Classification       Desirable:  < 200 mg/dL               Borderline High:  200 - 239 mg/dL          High:  > = 240 mg/dL  . Triglycerides 10/26/2016 467.0 Triglyceride is over 400; calculations on Lipids are invalid.* 0.0 - 149.0 mg/dL Final   Normal:  <150 mg/dLBorderline High:  150 - 199 mg/dL  . HDL 10/26/2016 32.70* >39.00 mg/dL Final  . Total CHOL/HDL Ratio 10/26/2016 6   Final                  Men          Women1/2 Average Risk     3.4          3.3Average Risk          5.0          4.42X Average Risk          9.6          7.13X Average Risk          15.0          11.0                      .  Direct LDL 10/26/2016 95.0  mg/dL Final   Optimal:  <100 mg/dLNear or Above Optimal:  100-129 mg/dLBorderline High:  130-159 mg/dLHigh:  160-189 mg/dLVery High:  >190 mg/dL    Physical Examination:  BP 136/64   Pulse 84   Ht 5\' 3"  (1.6 m)   Wt 214 lb (97.1 kg)   SpO2 96%   BMI 37.91 kg/m          ASSESSMENT:  Diabetes type 2, uncontrolled With A1c 11.1    She has had persistently poor control of diabetes  Has been noncompliant with follow-up and various aspects of self-care including monitoring  Despite having treatment with Jardiance 25 mg and Trulicity her blood sugars are as high as 310 fasting Home readings not available for review She is not doing much exercise or losing weight  She is not monitoring blood sugars much at home except occasional fasting readings, again she claims that her new Verio meter is also giving her error messages Discussed with the patient that she has progression of her diabetes, severe hyperglycemia need to reduce her A1c about 4% which is unlikely to  occur with non-insulin remedies which she has tried Also may not achieve control with even some weight in the short-term Insulin as needed for relieving glucose toxicity but she is refusing to do this now  HYPERCHOLESTEROLEMIA: LDL below 100 currently  PLAN:    She will be shown how to use the Ozempic pen today and she will titrate the dose after 2 weeks to 0.5 She does need to start exercising Follow-up instructions given by dietitian previously  Increase Amaryl to twice a day  Start extended release metformin instead of regular metformin and titrate gradually until reaching the maximum tolerated dosage up to 2000 mg a day  Continue Jardiance  Needs follow-up labs for renal function She was given a new One Touch Verio monitor to start checking her blood sugars twice a day at various times, discussed blood sugar targets fasting and after meals.  She was shown how to use this  Counseling time  on subjects discussed in assessment and plan sections is over 50% of today's 25 minute visit    Patient Instructions  Check blood sugars on waking up 3-4/7 days    Also check blood sugars about 2 hours after a meal and do this after different meals by rotation  Recommended blood sugar levels on waking up is 90-130 and about 2 hours after meal is 130-160  Please bring your blood sugar monitor to each visit, thank you  Take GLIMEPIRIDE with breakfast and dinner  OZEMPIC: Take weekly by starting 0.25 mg for the first 2 doses and then 0.5 mg weekly as long as there is no nausea  METFORMIN: Switch to the metformin ER preparation, start with 1 tablet daily with 2 main meal and every 4-5 days add another tablet until you are taking 4 tablets or the maximum tolerated dose  Walk daily for exercise or consider doing water aerobics Moderate portions of carbohydrates, sweets and high-fat intake   Monica Clark 11/02/2016, 9:25 PM   Note: This office note was prepared with Dragon voice recognition system technology. Any transcriptional errors that result from this process are unintentional.

## 2016-11-03 DIAGNOSIS — Z23 Encounter for immunization: Secondary | ICD-10-CM | POA: Diagnosis not present

## 2016-11-17 ENCOUNTER — Telehealth: Payer: Self-pay | Admitting: *Deleted

## 2016-11-17 NOTE — Telephone Encounter (Signed)
Received faxed results of mammogram from [facility].  Made copy for md to review, and sent for scan in EPIC.

## 2016-11-29 ENCOUNTER — Other Ambulatory Visit: Payer: BC Managed Care – PPO

## 2016-11-30 ENCOUNTER — Other Ambulatory Visit: Payer: Self-pay | Admitting: Family Medicine

## 2016-11-30 ENCOUNTER — Ambulatory Visit
Admission: RE | Admit: 2016-11-30 | Discharge: 2016-11-30 | Disposition: A | Discharge status: Home / Self Care | Payer: BC Managed Care – PPO | Source: Ambulatory Visit | Attending: Family Medicine | Admitting: Family Medicine

## 2016-11-30 DIAGNOSIS — R1031 Right lower quadrant pain: Secondary | ICD-10-CM

## 2016-11-30 IMAGING — CT CT ABD-PELV W/O CM
2 of 4 series · 13 of 36 positions shown, 19 images · non-contrast
Comparison: Pelvic ultrasound [DATE].

CLINICAL DATA: Right lower quadrant pain

EXAM:
CT ABDOMEN AND PELVIS WITHOUT CONTRAST
TECHNIQUE: Multidetector CT imaging of the abdomen and pelvis was performed
following the standard protocol without IV contrast.

[Series 601: coronal body · coronal · 0.85mm/px · 1 of 141 slices shown, 2 images]
[im 47/141  soft-tissue]
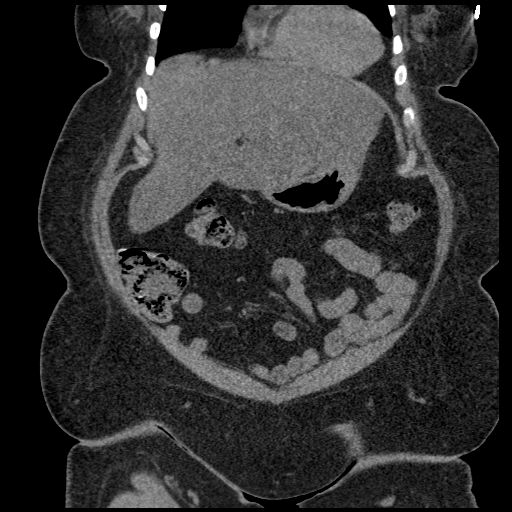
[im 47/141  bone]
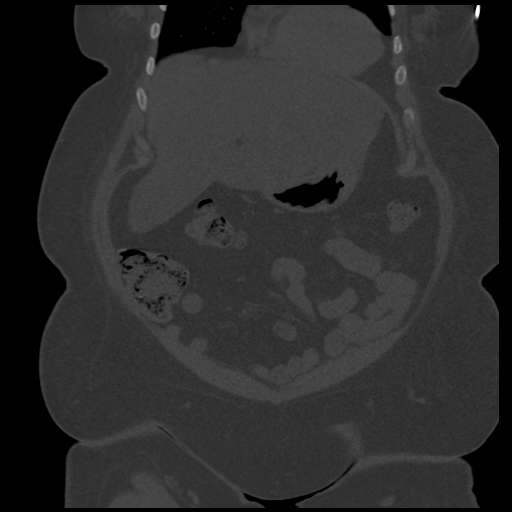

[Series 602: sagittal body · sagittal · 0.85mm/px · 12 of 171 slices shown, 17 images]
[im 10/171  soft-tissue]
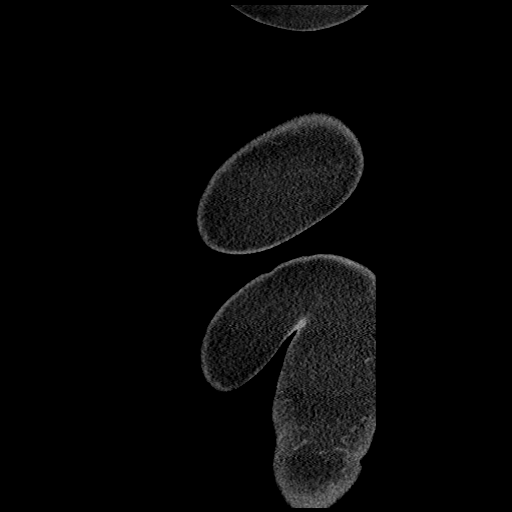
[im 10/171  lung]
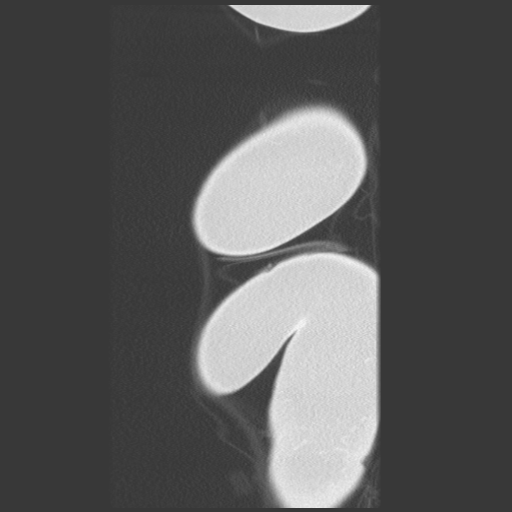
[im 10/171  bone]
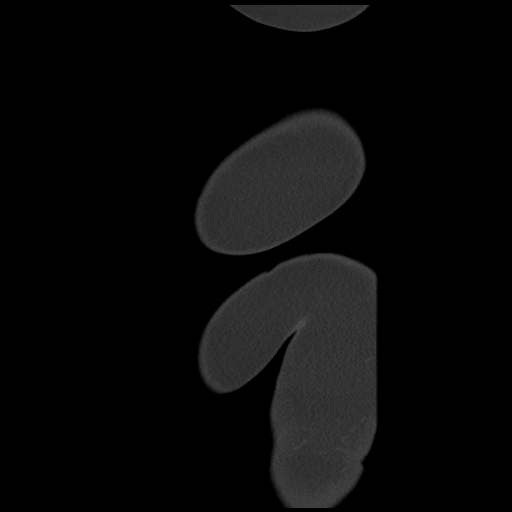
[im 19/171  lung]
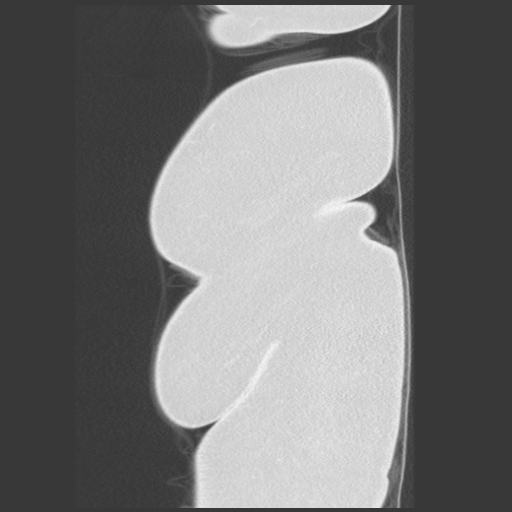
[im 29/171  soft-tissue]
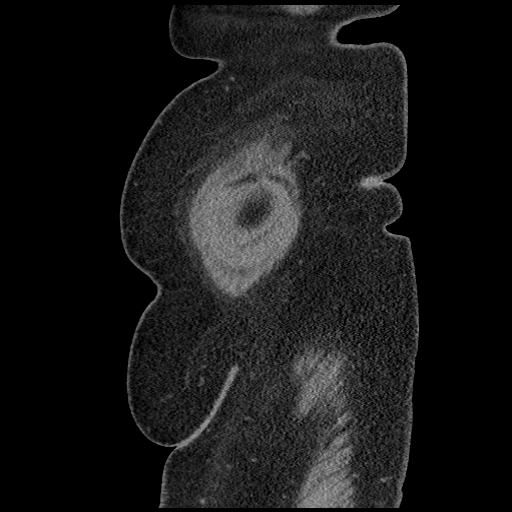
[im 29/171  lung]
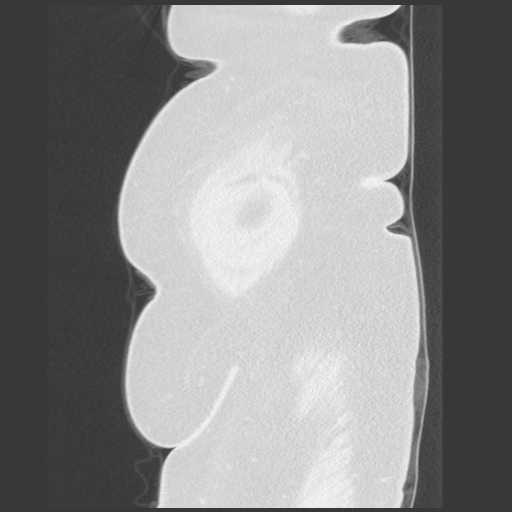
[im 38/171  soft-tissue]
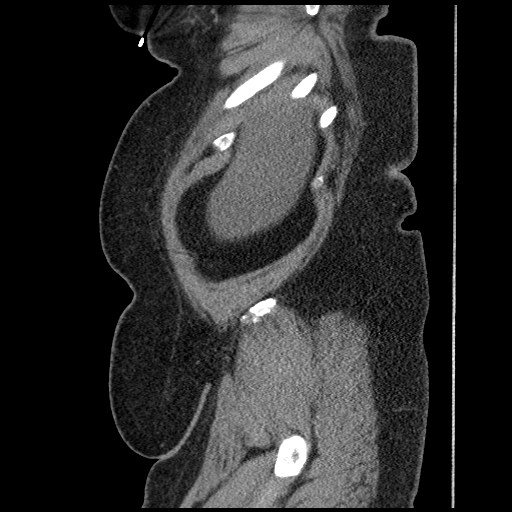
[im 38/171  lung]
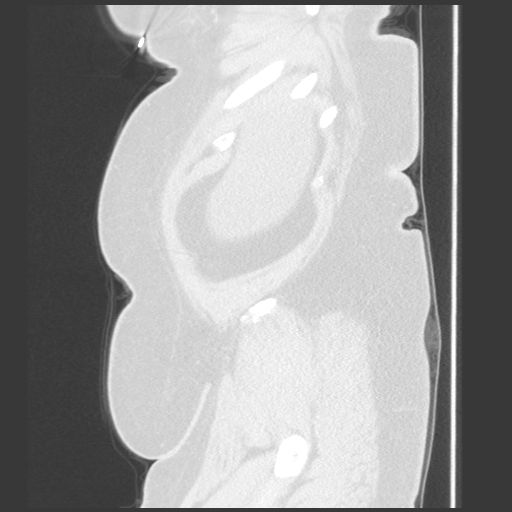
[im 57/171  soft-tissue]
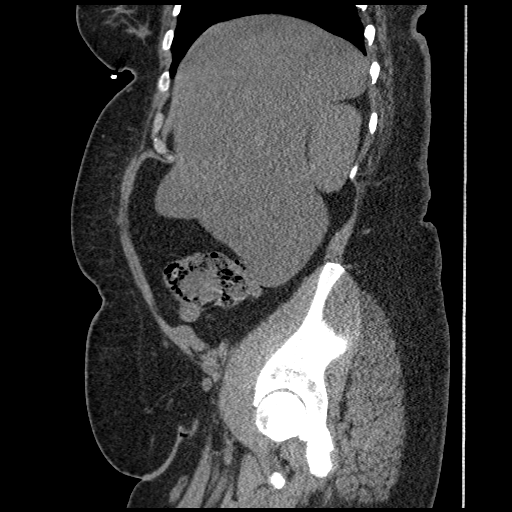
[im 67/171  soft-tissue]
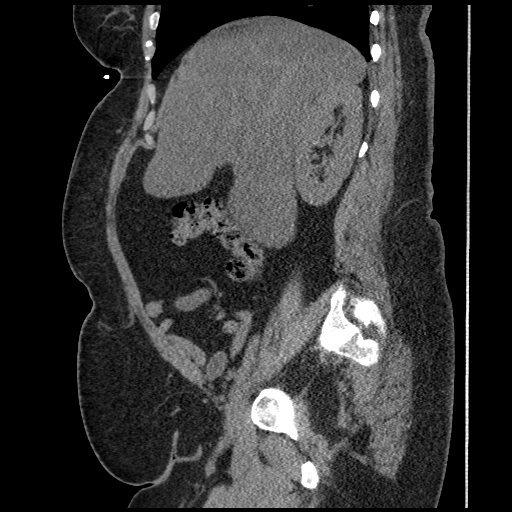
[im 86/171  soft-tissue]
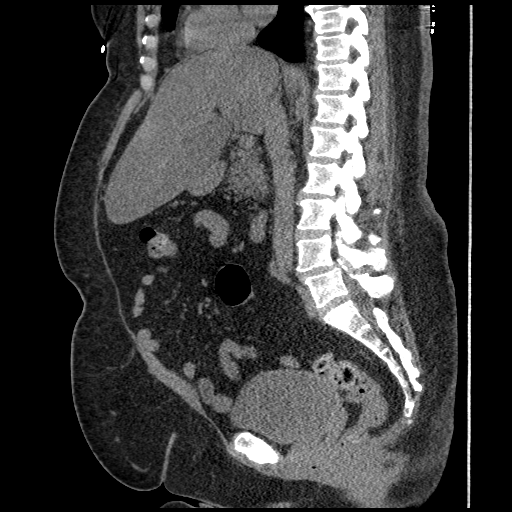
[im 104/171  soft-tissue]
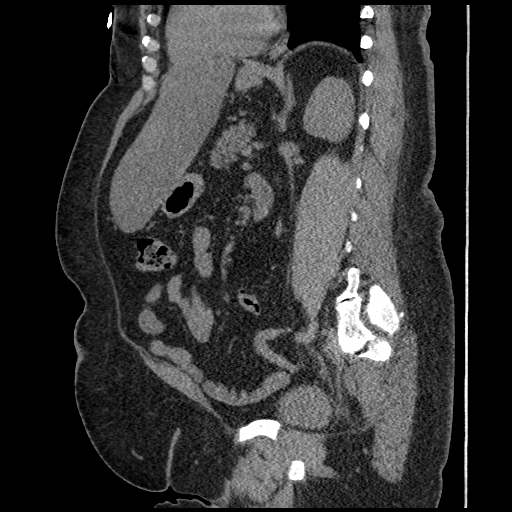
[im 114/171  soft-tissue]
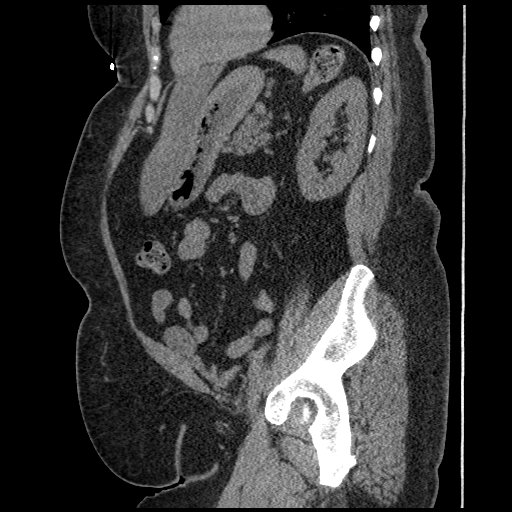
[im 133/171  soft-tissue]
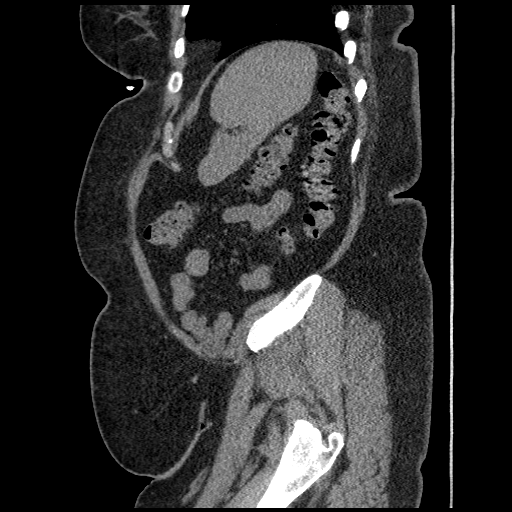
[im 133/171  bone]
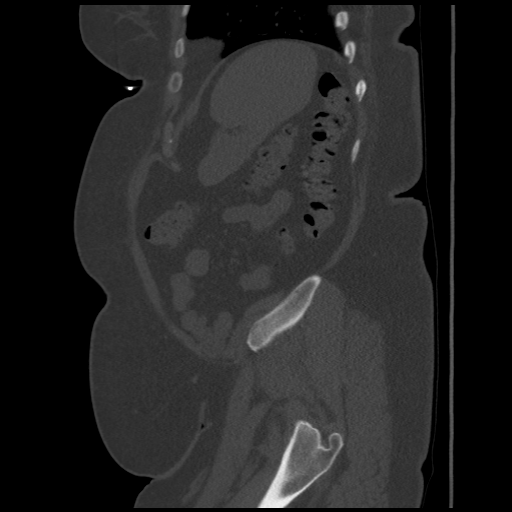
[im 142/171  soft-tissue]
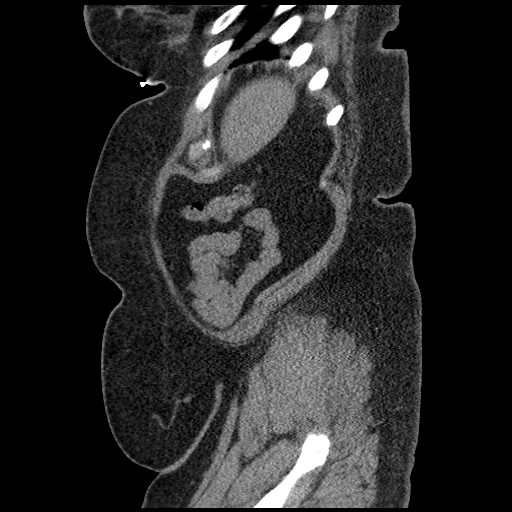
[im 161/171  soft-tissue]
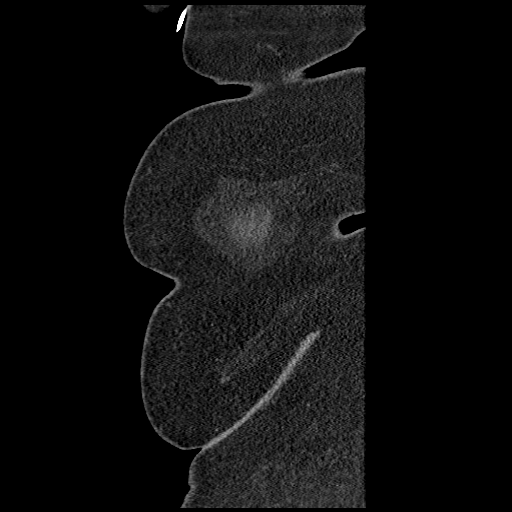

[13 of 36 positions shown; findings below may reference images not displayed]

FINDINGS: Lower chest: No acute finding.

Hepatobiliary: Fatty infiltration of the liver. No focal abnormality
or biliary ductal dilatation. Prior cholecystectomy.

Pancreas: No focal abnormality or ductal dilatation.

Spleen: No focal abnormality.  Normal size.

Adrenals/Urinary Tract: No adrenal abnormality. No focal renal
abnormality. No stones or hydronephrosis. Urinary bladder is
unremarkable.

Stomach/Bowel: Normal appendix. Descending colonic and sigmoid
diverticulosis. No active diverticulitis. No evidence of bowel
obstruction.

Vascular/Lymphatic: No evidence of aneurysm or adenopathy.

Reproductive: Prior hysterectomy.  No adnexal masses.

Other: No free fluid or free air.

Musculoskeletal: No acute bony abnormality.
IMPRESSION: Diffuse fatty infiltration of the liver.

No renal or ureteral stones.  No hydronephrosis.

Normal appendix.

Left colonic diverticulosis.

## 2016-12-18 ENCOUNTER — Ambulatory Visit: Payer: BC Managed Care – PPO | Admitting: Endocrinology

## 2017-01-24 ENCOUNTER — Ambulatory Visit: Payer: BC Managed Care – PPO | Admitting: Endocrinology

## 2017-02-09 ENCOUNTER — Encounter: Payer: Self-pay | Admitting: Endocrinology

## 2017-02-09 LAB — HEMOGLOBIN A1C: HEMOGLOBIN A1C: 15.5

## 2017-02-10 ENCOUNTER — Encounter (HOSPITAL_COMMUNITY): Payer: Self-pay | Admitting: Emergency Medicine

## 2017-02-10 ENCOUNTER — Emergency Department (HOSPITAL_COMMUNITY)
Admission: EM | Admit: 2017-02-10 | Discharge: 2017-02-11 | Disposition: A | Discharge status: Home / Self Care | Payer: BC Managed Care – PPO | Attending: Emergency Medicine | Admitting: Emergency Medicine

## 2017-02-10 DIAGNOSIS — E1165 Type 2 diabetes mellitus with hyperglycemia: Secondary | ICD-10-CM | POA: Diagnosis present

## 2017-02-10 DIAGNOSIS — I10 Essential (primary) hypertension: Secondary | ICD-10-CM | POA: Diagnosis not present

## 2017-02-10 DIAGNOSIS — Z7984 Long term (current) use of oral hypoglycemic drugs: Secondary | ICD-10-CM | POA: Diagnosis not present

## 2017-02-10 DIAGNOSIS — Z79899 Other long term (current) drug therapy: Secondary | ICD-10-CM | POA: Insufficient documentation

## 2017-02-10 DIAGNOSIS — Z8542 Personal history of malignant neoplasm of other parts of uterus: Secondary | ICD-10-CM | POA: Insufficient documentation

## 2017-02-10 LAB — CBC
HCT: 47.6 % — ABNORMAL HIGH (ref 36.0–46.0)
HEMOGLOBIN: 16.1 g/dL — AB (ref 12.0–15.0)
MCH: 31.1 pg (ref 26.0–34.0)
MCHC: 33.8 g/dL (ref 30.0–36.0)
MCV: 91.9 fL (ref 78.0–100.0)
Platelets: 280 10*3/uL (ref 150–400)
RBC: 5.18 MIL/uL — AB (ref 3.87–5.11)
RDW: 13.5 % (ref 11.5–15.5)
WBC: 9.5 10*3/uL (ref 4.0–10.5)

## 2017-02-10 LAB — URINALYSIS, ROUTINE W REFLEX MICROSCOPIC
BILIRUBIN URINE: NEGATIVE
Bacteria, UA: NONE SEEN
Hgb urine dipstick: NEGATIVE
KETONES UR: 20 mg/dL — AB
LEUKOCYTES UA: NEGATIVE
Nitrite: NEGATIVE
PROTEIN: NEGATIVE mg/dL
RBC / HPF: NONE SEEN RBC/hpf (ref 0–5)
Specific Gravity, Urine: 1.03 (ref 1.005–1.030)
WBC UA: NONE SEEN WBC/hpf (ref 0–5)
pH: 5 (ref 5.0–8.0)

## 2017-02-10 LAB — BASIC METABOLIC PANEL
Anion gap: 17 — ABNORMAL HIGH (ref 5–15)
BUN: 12 mg/dL (ref 6–20)
CHLORIDE: 95 mmol/L — AB (ref 101–111)
CO2: 20 mmol/L — AB (ref 22–32)
Calcium: 10 mg/dL (ref 8.9–10.3)
Creatinine, Ser: 0.86 mg/dL (ref 0.44–1.00)
GFR calc Af Amer: >60 mL/min (ref >60)
GFR calc non Af Amer: >60 mL/min (ref >60)
Glucose, Bld: 319 mg/dL — ABNORMAL HIGH (ref 65–99)
POTASSIUM: 3.8 mmol/L (ref 3.5–5.1)
SODIUM: 132 mmol/L — AB (ref 135–145)

## 2017-02-10 LAB — CBG MONITORING, ED
Glucose-Capillary: 251 mg/dL — ABNORMAL HIGH (ref 65–99)
Glucose-Capillary: 306 mg/dL — ABNORMAL HIGH (ref 65–99)

## 2017-02-10 MED ORDER — SODIUM CHLORIDE 0.9 % IV BOLUS (SEPSIS)
1000.0000 mL | Freq: Once | INTRAVENOUS | Status: AC
  Administered 2017-02-10: 1000 mL via INTRAVENOUS

## 2017-02-10 MED ORDER — INSULIN ASPART 100 UNIT/ML ~~LOC~~ SOLN
6.0000 [IU] | Freq: Once | SUBCUTANEOUS | Status: AC
  Administered 2017-02-10: 6 [IU] via INTRAVENOUS
  Filled 2017-02-10: qty 1

## 2017-02-10 MED ORDER — INSULIN ASPART 100 UNIT/ML ~~LOC~~ SOLN
2.0000 [IU] | Freq: Once | SUBCUTANEOUS | Status: AC
Start: 2017-02-10 — End: 2017-02-10
  Administered 2017-02-10: 2 [IU] via INTRAVENOUS
  Filled 2017-02-10: qty 1

## 2017-02-10 MED ORDER — SODIUM CHLORIDE 0.9 % IV BOLUS (SEPSIS)
500.0000 mL | Freq: Once | INTRAVENOUS | Status: AC
  Administered 2017-02-10: 500 mL via INTRAVENOUS

## 2017-02-10 NOTE — ED Provider Notes (Signed)
[facility] EMERGENCY DEPARTMENT Provider Note   CSN: [id number] Arrival date & time: 02/10/17  1729     History   Chief Complaint Chief Complaint  Patient presents with  . Hyperglycemia    HPI Monica Clark is a 61 y.o. female.  Patient with our blood pressure diabetes history, currently not taking her oral diabetic medications for 2-3 weeks fora recent presents with elevated glucose levels and feeling generally unwell for the past few days. No fevers or chills.no drug use. Nonsmoker      Past Medical History:  Diagnosis Date  . Breast mass, left    duct mass  . Endometrial hyperplasia   . Heart murmur    states no known problems, has never seen a cardiologist  . Hypertension    states under control with med., has been on med. x 5 yr.  . Immature cataract   . 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 Nipple discharge 04/2014   left breast  . Non-insulin dependent type 2 diabetes mellitus [facility])     Patient Active Problem List   Diagnosis Date Noted  . Transaminitis 01/22/2015  . Endometrial cancer, grade I ([place]) 11/09/2014  . Atypical ductal hyperplasia of breast 08/09/2014  . Nipple discharge 03/29/2011    Past Surgical History:  Procedure Laterality Date  . BREAST BIOPSY Left 04/30/2014   Procedure: LEFT BREAST BIOPSY;  Surgeon: Jackolyn Confer, MD;  Location: [facility];  Service: General;  Laterality: Left;  . BREAST DUCTAL SYSTEM EXCISION Left 04/30/2014   Procedure: LEFT NIPPLE DUCT EXCISION;  Surgeon: Jackolyn Confer, MD;  Location: [facility];  Service: General;  Laterality: Left;  . [place]  . CHOLECYSTECTOMY  1989  . ROBOTIC ASSISTED TOTAL HYSTERECTOMY WITH BILATERAL SALPINGO OOPHERECTOMY Bilateral 10/13/2014   Procedure: ROBOTIC ASSISTED TOTAL HYSTERECTOMY WITH BILATERAL SALPINGO OOPHORECTOMY ;  Surgeon: Everitt Amber, MD;  Location: [facility];  Service: Gynecology;  Laterality: Bilateral;      OB History    No data available       Home Medications    Prior to Admission medications   Medication Sig Start Date End Date Taking? Authorizing Provider  amLODipine (NORVASC) 5 MG tablet Take 5 mg by mouth daily.  02/28/16   [provider]  empagliflozin (JARDIANCE) 10 MG TABS tablet TAKE 1 TABLET BY MOUTH EVERY DAY WITH BREAKFAST 02/15/16   Elayne Snare, MD  glimepiride (AMARYL) 4 MG tablet Take 4 mg by mouth daily with breakfast.    [provider]  glucose blood (ONE TOUCH TEST STRIPS) test strip Use as instructed one time a day 09/03/15   Elayne Snare, MD  metFORMIN (GLUCOPHAGE-XR) 500 MG 24 hr tablet Take 4 tablets (2,000 mg total) by mouth daily with supper. 11/02/16   Elayne Snare, MD  PHARMACIST CHOICE NO CODING test strip  09/30/14   [provider]  Semaglutide (OZEMPIC) 0.25 or 0.5 MG/DOSE SOPN Inject 0.5 mg into the skin once a week. 11/02/16   Elayne Snare, MD  tamoxifen (NOLVADEX) 20 MG tablet TAKE 1 TABLET EVERY DAY 02/11/16   Truitt Merle, MD  triamterene-hydrochlorothiazide (MAXZIDE-25) 37.5-25 MG per tablet Take 1 tablet by mouth every morning. Reported on 09/03/2015    [provider]    Family History Family History  Problem Relation Age of Onset  . Diabetes Mother   . Heart disease Father   . Cancer Father        throat  cancer   . Diabetes Sister   . Diabetes Brother     Social History Social History   Tobacco Use  . Smoking status: Never Smoker  . Smokeless tobacco: Never Used  Substance Use Topics  . Alcohol use: Yes    Comment: rarely  . Drug use: No     Allergies   Codeine   Review of Systems Review of Systems  Constitutional: Positive for fatigue. Negative for chills and fever.  HENT: Negative for congestion.   Eyes: Negative for visual disturbance.  Respiratory: Negative for shortness of breath.   Cardiovascular: Negative for chest pain.  Gastrointestinal: Negative for abdominal pain and vomiting.   Endocrine: Positive for polydipsia and polyuria.  Genitourinary: Negative for dysuria and flank pain.  Musculoskeletal: Negative for back pain, neck pain and neck stiffness.  Skin: Negative for rash.  Neurological: Negative for light-headedness and headaches.     Physical Exam Updated Vital Signs BP 136/80   Pulse 81   Temp 99.1 F (37.3 C) (Oral)   Resp 16   SpO2 97%   Physical Exam  Constitutional: She is oriented to person, place, and time. She appears well-developed and well-nourished.  HENT:  Head: Normocephalic and atraumatic.  Dry mucous membranes  Eyes: Conjunctivae are normal. Right eye exhibits no discharge. Left eye exhibits no discharge.  Neck: Normal range of motion. Neck supple. No tracheal deviation present.  Cardiovascular: Normal rate and regular rhythm.  Pulmonary/Chest: Effort normal and breath sounds normal.  Abdominal: Soft. She exhibits no distension. There is no tenderness. There is no guarding.  Musculoskeletal: She exhibits no edema.  Neurological: She is alert and oriented to person, place, and time.  Skin: Skin is warm. No rash noted.  Psychiatric: She has a normal mood and affect.  Nursing note and vitals reviewed.    ED Treatments / Results  Labs (all labs ordered are listed, but only abnormal results are displayed) Labs Reviewed  BASIC METABOLIC PANEL - Abnormal; Notable for the following components:      Result Value   Sodium 132 (*)    Chloride 95 (*)    CO2 20 (*)    Glucose, Bld 319 (*)    Anion gap 17 (*)    All other components within normal limits  CBC - Abnormal; Notable for the following components:   RBC 5.18 (*)    Hemoglobin 16.1 (*)    HCT 47.6 (*)    All other components within normal limits  URINALYSIS, ROUTINE W REFLEX MICROSCOPIC - Abnormal; Notable for the following components:   Color, Urine STRAW (*)    Glucose, UA >=500 (*)    Ketones, ur 20 (*)    Squamous Epithelial / LPF 0-5 (*)    All other components  within normal limits  CBG MONITORING, ED - Abnormal; Notable for the following components:   Glucose-Capillary 306 (*)    All other components within normal limits  CBG MONITORING, ED - Abnormal; Notable for the following components:   Glucose-Capillary 251 (*)    All other components within normal limits  CBG MONITORING, ED    EKG  EKG Interpretation None       Radiology No results found.  Procedures Procedures (including critical care time)  Medications Ordered in ED Medications  sodium chloride 0.9 % bolus 1,000 mL (0 mLs Intravenous Stopped 02/10/17 2236)  insulin aspart (novoLOG) injection 2 Units (2 Units Intravenous Given 02/10/17 2059)  insulin aspart (novoLOG) injection 6 Units (6 Units  Intravenous Given 02/10/17 2233)  sodium chloride 0.9 % bolus 500 mL (500 mLs Intravenous New Bag/Given 02/10/17 2234)     Initial Impression / Assessment and Plan / ED Course  I have reviewed the triage vital signs and the nursing notes.  Pertinent labs & imaging results that were available during my care of the patient were reviewed by me and considered in my medical decision making (see chart for details).    Well-appearing patient presents with uncontrolled diabetes and elevated glucose levels. Small amount ketones in the urine. Minimal anion gap. Plan for insulin, IV fluids and reassessment. Patient comfortable close outpatient follow-up once labs improve.  Glu improved in ED, dc with plan for improved medication compliance.  Results and differential diagnosis were discussed with the patient/parent/guardian. Xrays were independently reviewed by myself.  Close follow up outpatient was discussed, comfortable with the plan.   Medications  sodium chloride 0.9 % bolus 1,000 mL (0 mLs Intravenous Stopped 02/10/17 2236)  insulin aspart (novoLOG) injection 2 Units (2 Units Intravenous Given 02/10/17 2059)  insulin aspart (novoLOG) injection 6 Units (6 Units Intravenous Given  02/10/17 2233)  sodium chloride 0.9 % bolus 500 mL (500 mLs Intravenous New Bag/Given 02/10/17 2234)    Vitals:   02/10/17 2215 02/10/17 2230 02/10/17 2315 02/10/17 2330  BP: (!) 146/96 130/82 128/81 136/80  Pulse: 88 82 84 81  Resp: 19 19 11 16   Temp:      TempSrc:      SpO2: 96% 96% 99% 97%    Final diagnoses:  Type 2 diabetes mellitus with hyperglycemia, without long-term current use of insulin ([place])     Final Clinical Impressions(s) / ED Diagnoses   Final diagnoses:  Type 2 diabetes mellitus with hyperglycemia, without long-term current use of insulin [facility])    ED Discharge Orders    None       Elnora Morrison, MD 02/11/17 0003

## 2017-02-10 NOTE — Discharge Instructions (Signed)
Take your medicines as directed. Use water as main source of liquid intake.   If you were given medicines take as directed.  If you are on coumadin or contraceptives realize their levels and effectiveness is altered by many different medicines.  If you have any reaction (rash, tongues swelling, other) to the medicines stop taking and see a physician.    If your blood pressure was elevated in the ER make sure you follow up for management with a primary doctor or return for chest pain, shortness of breath or stroke symptoms.  Please follow up as directed and return to the ER or see a physician for new or worsening symptoms.  Thank you. Vitals:   02/10/17 2215 02/10/17 2230 02/10/17 2315 02/10/17 2330  BP: (!) 146/96 130/82 128/81 136/80  Pulse: 88 82 84 81  Resp: 19 19 11 16   Temp:      TempSrc:      SpO2: 96% 96% 99% 97%

## 2017-02-10 NOTE — ED Triage Notes (Signed)
Pt was seen by pcp yesterday and called today to inform her blood sugar was 500 and told her to come to ED. Pt states she has been urinating frequent and just not feeling well.. Pt states for the past 3 weeks she has not been taking her oral diabetes medicines like she is prescribed.

## 2017-02-16 ENCOUNTER — Telehealth: Payer: Self-pay | Admitting: Endocrinology

## 2017-02-16 NOTE — Telephone Encounter (Signed)
Called the patient and let her know that I do not see a message from anyone that called her but I did see that she needs an appointment so I have made an appointment for her on 03/28/2017.

## 2017-02-16 NOTE — Telephone Encounter (Signed)
Pt states she is returning a phone call from a couple days.  Please advise

## 2017-03-07 ENCOUNTER — Encounter: Payer: Self-pay | Admitting: Endocrinology

## 2017-03-08 ENCOUNTER — Telehealth: Payer: Self-pay | Admitting: Endocrinology

## 2017-03-08 NOTE — Telephone Encounter (Signed)
-----   Message from Elayne Snare, MD sent at 03/07/2017  1:23 PM EST ----- Regarding: Early appointment She has had very high blood sugars,  should be worked in for a sooner appointment than 03/28/17?

## 2017-03-08 NOTE — Telephone Encounter (Signed)
LM for pt to call back to schedule a sooner appt per MD request

## 2017-03-09 ENCOUNTER — Telehealth: Payer: Self-pay | Admitting: *Deleted

## 2017-03-09 DIAGNOSIS — C541 Malignant neoplasm of endometrium: Secondary | ICD-10-CM

## 2017-03-09 MED ORDER — TAMOXIFEN CITRATE 20 MG PO TABS: 20.0000 mg | ORAL_TABLET | Freq: Every day | ORAL | 0 refills | Status: DC

## 2017-03-09 NOTE — Telephone Encounter (Signed)
Received refill request from [facility] for tamoxifen.  Pt last filled 11/13/16.  Original script was for 30 with 2 refills from 02/10/17.  Called pt & she states that she isn't taking every day.  Informed that it should be taken daily.  She doesn't have a f/u appt.  Transferred to scheduler to make appt.

## 2017-03-09 NOTE — Telephone Encounter (Signed)
Melissa, please schedule her to see me within one month, make sure she knows the appointment. Thanks   Truitt Merle MD

## 2017-03-15 ENCOUNTER — Other Ambulatory Visit: Payer: BC Managed Care – PPO

## 2017-03-15 ENCOUNTER — Ambulatory Visit: Payer: BC Managed Care – PPO | Admitting: Hematology

## 2017-03-16 ENCOUNTER — Telehealth: Payer: Self-pay | Admitting: Hematology

## 2017-03-16 NOTE — Telephone Encounter (Signed)
Scheduled appts per 1/24 sch msg - spoke with patient regarding appt that was added.

## 2017-03-21 ENCOUNTER — Other Ambulatory Visit: Payer: BC Managed Care – PPO

## 2017-03-21 ENCOUNTER — Ambulatory Visit: Payer: BC Managed Care – PPO | Admitting: Hematology

## 2017-03-21 NOTE — Progress Notes (Signed)
[facility]  [phone] [phone]  Clinic follow Up Note   Patient Care Team: [name], MD as PCP - General (Family Medicine) Everitt Amber, MD as Consulting Physician (Obstetrics and Gynecology)   Date of Service:  03/22/2017  CHIEF COMPLAINTS:  Follow up left breast atypical ductal hyperplasia  HISTORY OF PRESENTING ILLNESS:  Monica Clark 62 y.o. female is here because of recently diagnosed left breast ADH, she presents to our high risk breast cancer clinic to discuss risk reduction management.  She has been having left nipple discharge for the past 1 year. She had multiple mammograms, and underwent ductogram on 03/04/2014, which showed dilatation of the terminal duct. She was referred to general surgeon Dr. Leeroy Bock, and underwent excision of left nipple ducts and left breast biopsy on 04/30/2014. The final surgical path showed ductal papilloma and a typical ductal hyperplasia.  She has recovered well from the surgery. She had normal breast discharge. The surgical pain has nearly resolved and she is doing well overall. She denies any significant pain, or other symptoms.  She had menopause at age of 69. She has been having vaginal spotting in the past 4-5 years. She had multiple endometrial biopsy which was negative. She is on provera.   GYN HISTORY  Menarchal: 12 LMP: 47 Contraceptive: no HRT: yes G3P3, she did breast feeding to her children:  CURRENT THERAPY: Tamoxifen 20 mg daily, started on 11/30/2014   INTERIM HISTORY  Kerra returns for follow-up. She was last seen by me 1 year ago. She presents to the clinic today noting she is still taking Tamoxifen and denies any problems from the medication. She notes she is no longer seeing Dr. Denman George about her previous endometrial cancer. She does follow up with her GYN. She notes she has not undergone genetic testing. Her grandfather had prostate cancer but no other family history of cancer.      On review of symptoms, pt notes she still has dry skin even with different moisturizers. She denies any rash.      MEDICAL HISTORY:  Past Medical History:  Diagnosis Date  . Breast mass, left    duct mass  . Endometrial hyperplasia   . Heart murmur    states no known problems, has never seen a cardiologist  . Hypertension    states under control with med., has been on med. x 5 yr.  . Immature cataract   . 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 Nipple discharge 04/2014   left breast  . Non-insulin dependent type 2 diabetes mellitus ([place])     SURGICAL HISTORY: Past Surgical History:  Procedure Laterality Date  . BREAST BIOPSY Left 04/30/2014   Procedure: LEFT BREAST BIOPSY;  Surgeon: Jackolyn Confer, MD;  Location: [facility];  Service: General;  Laterality: Left;  . BREAST DUCTAL SYSTEM EXCISION Left 04/30/2014   Procedure: LEFT NIPPLE DUCT EXCISION;  Surgeon: Jackolyn Confer, MD;  Location: [facility];  Service: General;  Laterality: Left;  . [place]  . CHOLECYSTECTOMY  1989  . ROBOTIC ASSISTED TOTAL HYSTERECTOMY WITH BILATERAL SALPINGO OOPHERECTOMY Bilateral 10/13/2014   Procedure: ROBOTIC ASSISTED TOTAL HYSTERECTOMY WITH BILATERAL SALPINGO OOPHORECTOMY ;  Surgeon: Everitt Amber, MD;  Location: [facility];  Service: Gynecology;  Laterality: Bilateral;    SOCIAL HISTORY: Social History   Socioeconomic History  . Marital status: Single    Spouse name: Not on file  . Number of children: Not on file  .  Years of education: Not on file  . Highest education level: Not on file  Social Needs  . Financial resource strain: Not on file  . Food insecurity - worry: Not on file  . Food insecurity - inability: Not on file  . Transportation needs - medical: Not on file  . Transportation needs - non-medical: Not on file  Occupational History  . Not on file  Tobacco Use  . Smoking status: Never Smoker  . Smokeless tobacco: Never Used   Substance and Sexual Activity  . Alcohol use: Yes    Comment: rarely  . Drug use: No  . Sexual activity: Not on file  Other Topics Concern  . Not on file  Social History Narrative  . Not on file    FAMILY HISTORY: Family History  Problem Relation Age of Onset  . Diabetes Mother   . Heart disease Father   . Cancer Father        throat cancer   . Diabetes Sister   . Diabetes Brother     ALLERGIES:  is allergic to codeine.  MEDICATIONS:  Current Outpatient Medications  Medication Sig Dispense Refill  . empagliflozin (JARDIANCE) 10 MG TABS tablet TAKE 1 TABLET BY MOUTH EVERY DAY WITH BREAKFAST 30 tablet 2  . glimepiride (AMARYL) 4 MG tablet Take 4 mg by mouth daily with breakfast.    . glucose blood (ONE TOUCH TEST STRIPS) test strip Use as instructed one time a day 100 each 12  . metFORMIN (GLUCOPHAGE-XR) 500 MG 24 hr tablet Take 4 tablets (2,000 mg total) by mouth daily with supper. 120 tablet 3  . PHARMACIST CHOICE NO CODING test strip     . Semaglutide (OZEMPIC) 0.25 or 0.5 MG/DOSE SOPN Inject 0.5 mg into the skin once a week. 1 pen 2  . tamoxifen (NOLVADEX) 20 MG tablet Take 1 tablet (20 mg total) by mouth daily. 30 tablet 11  . triamterene-hydrochlorothiazide (MAXZIDE-25) 37.5-25 MG per tablet Take 1 tablet by mouth every morning. Reported on 09/03/2015     No current facility-administered medications for this visit.     REVIEW OF SYSTEMS:   Constitutional: Denies fevers, chills or abnormal night sweats Eyes: Denies blurriness of vision, double vision or watery eyes Ears, nose, mouth, throat, and face: Denies mucositis or sore throat Respiratory: Denies cough, dyspnea or wheezes Cardiovascular: Denies palpitation, chest discomfort or lower extremity swelling Gastrointestinal:  Denies nausea, heartburn or change in bowel habits Skin: Denies abnormal skin rashes  (+) dry skin  Lymphatics: Denies new lymphadenopathy or easy bruising Neurological:Denies numbness,  tingling or new weaknesses Behavioral/Psych: Mood is stable, no new changes  All other systems were reviewed with the patient and are negative.  PHYSICAL EXAMINATION: ECOG PERFORMANCE STATUS: 0 - Asymptomatic  Vitals:   03/22/17 1249  BP: (!) 156/89  Pulse: 86  Resp: 18  Temp: 97.8 F (36.6 C)  SpO2: 99%   Filed Weights   03/22/17 1249  Weight: 203 lb 12.8 oz (92.4 kg)    GENERAL:alert, no distress and comfortable SKIN: skin color, texture, turgor are normal, no rashes or significant lesions EYES: normal, conjunctiva are pink and non-injected, sclera clear OROPHARYNX:no exudate, no erythema and lips, buccal mucosa, and tongue normal  NECK: supple, thyroid normal size, non-tender, without nodularity LYMPH:  no palpable lymphadenopathy in the cervical, axillary or inguinal LUNGS: clear to auscultation and percussion with normal breathing effort HEART: regular rate & rhythm and no murmurs and no lower extremity edema ABDOMEN:abdomen soft, non-tender  and normal bowel sounds Musculoskeletal:no cyanosis of digits and no clubbing  PSYCH: alert & oriented x 3 with fluent speech NEURO: no focal motor/sensory deficits Breasts: Breast inspection showed them to be symmetrical with no nipple discharge. Incision in the left breast has well healed. Palpation of the breasts and axilla revealed no obvious mass that I could appreciate.   LABORATORY DATA:  I have reviewed the data as listed CBC Latest Ref Rng & Units 03/22/2017 02/10/2017 03/03/2016  WBC 3.9 - 10.3 K/uL 11.2(H) 9.5 9.7  Hemoglobin 11.6 - 15.9 g/dL 14.5 16.1(H) 13.8  Hematocrit 34.8 - 46.6 % 43.5 47.6(H) 41.1  Platelets 145 - 400 K/uL 420(H) 280 346    CMP Latest Ref Rng & Units 03/22/2017 02/10/2017 10/26/2016  Glucose 70 - 140 mg/dL 242(H) 319(H) 310(H)  BUN 7 - 26 mg/dL 11 12 15   Creatinine 0.60 - 1.10 mg/dL 0.95 0.86 0.88  Sodium 136 - 145 mmol/L 139 132(L) 139  Potassium 3.5 - 5.1 mmol/L 4.0 3.8 4.0  Chloride 98 - 109  mmol/L 103 95(L) 103  CO2 22 - 29 mmol/L 26 20(L) 23  Calcium 8.4 - 10.4 mg/dL 10.2 10.0 9.8  Total Protein 6.4 - 8.3 g/dL 8.7(H) - 8.1  Total Bilirubin 0.2 - 1.2 mg/dL 0.5 - 0.4  Alkaline Phos 40 - 150 U/L 114 - 95  AST 5 - 34 U/L 24 - 39(H)  ALT 0 - 55 U/L 17 - 30     PATHOLOGY REPORT: Breast, biopsy, Left superior duct 04/30/2014 - ATYPICAL DUCTAL HYPERPLASIA. - INTRADUCTAL PAPILLOMA. - FIBROCYSTIC CHANGES. - SEE COMMENT. Microscopic Comment The surgical resection margin(s) of the specimen were inked and microscopically evaluated.  RADIOGRAPHIC STUDIES: I have personally reviewed the radiological images as listed and agreed with the findings in the report. No results found.   CT AP WO Contrast 11/30/16 IMPRESSION: Diffuse fatty infiltration of the liver. No renal or ureteral stones.  No hydronephrosis. Normal appendix. Left colonic diverticulosis.   Screening Mammogram Bilateral 10/20/16  IMPRESSION: There is no mammographic evidence of malignancy. Routine mammographic evaluation in 1 year is recommended.     ASSESSMENT & PLAN:   62 y.o. postmenopausal African-American female with PMH of DM, HTN and obesity  1. left breast atypical lobular hyperplasia -She is on chemoprevention with tamoxifen since 11/30/14, tolerating well, we'll continue for total 5 years. -Potential side effects of tamoxifen were reviewed with her again, especially increased risk of thrombosis, and the strategy to prevent thrombosis. She has had hysterectomy, no risk for endometrial cancer. -She'll continue annual screening mammogram with 3-D, she is very compliant. I also encouraged her to continue self exam, and see [place] every 6 months for physical exam. -She is clinically doing well. Lab reviewed, her CBC and CMP are within normal limits. Her physical exam and her 09/2017 mammogram were unremarkable. There is no clinical concern for recurrence. -Next mammogram in 09/2017 -We'll continue  tamoxifen, plan for total 5 years. Refilled today  -I discussed her eligibility for genetic testing to rule our any lynch syndrome. She is agreeable.  -F/u in 1 year    2. Stage I endometrial cancer, 2016 -She underwent total hysterectomy with BSO on 10/13/14 by Dr. Denman George  -She continues to be followed by her GYN.  -given no other family history of malignancy, she does not meet the criteria for genetic testing (I spoke with our genetic counselor)   3. HTN, DM -She'll continue follow-up with her primary care physician -Her diabetes has  gotten worse since she started tamoxifen, possible related, I encouraged her follow-up with her endocrinologist, eat healthy and exercise regularly.  4. Transaminitis  -She has been having slightly elevated AST, even before she started tamoxifen. She had ultrasound several years ago which revealed fatty liver, which is likely the etiology of her elevated AST. -I encouraged her eat a healthy and try to lose some weight, and check lipid profile with her primary care physician -She had a CT AP in 11/30/16 which showed evidence of diffuse fatty infiltration of the liver.  -Resolved, AST 24 and ALT 17 (03/22/17)  Plan -Refill and continue tamoxifen -Lab and f/u in one year  -Mammogram at [place] in 8/.2019     All questions were answered. The patient knows to call the clinic with any problems, questions or concerns. I spent 20 minutes counseling the patient face to face. The total time spent in the appointment was 25 minutes and more than 50% was on counseling.   This document serves as a record of services personally performed by Truitt Merle, MD. It was created on her behalf by Joslyn Devon, a trained medical scribe. The creation of this record is based on the scribe's personal observations and the provider's statements to them.    I have reviewed the above documentation for accuracy and completeness, and I agree with the above.     Truitt Merle, MD 03/22/2017

## 2017-03-22 ENCOUNTER — Inpatient Hospital Stay: Payer: BC Managed Care – PPO | Attending: Hematology

## 2017-03-22 ENCOUNTER — Encounter: Payer: Self-pay | Admitting: Hematology

## 2017-03-22 ENCOUNTER — Inpatient Hospital Stay: Payer: BC Managed Care – PPO | Admitting: Hematology

## 2017-03-22 VITALS — BP 156/89 | HR 86 | Temp 97.8°F | Resp 18 | Ht 63.0 in | Wt 203.8 lb

## 2017-03-22 DIAGNOSIS — N6092 Unspecified benign mammary dysplasia of left breast: Secondary | ICD-10-CM | POA: Diagnosis present

## 2017-03-22 DIAGNOSIS — E119 Type 2 diabetes mellitus without complications: Secondary | ICD-10-CM | POA: Diagnosis not present

## 2017-03-22 DIAGNOSIS — Z90710 Acquired absence of both cervix and uterus: Secondary | ICD-10-CM

## 2017-03-22 DIAGNOSIS — C541 Malignant neoplasm of endometrium: Secondary | ICD-10-CM

## 2017-03-22 DIAGNOSIS — R740 Nonspecific elevation of levels of transaminase and lactic acid dehydrogenase [LDH]: Secondary | ICD-10-CM

## 2017-03-22 DIAGNOSIS — Z79811 Long term (current) use of aromatase inhibitors: Secondary | ICD-10-CM | POA: Diagnosis not present

## 2017-03-22 DIAGNOSIS — N6099 Unspecified benign mammary dysplasia of unspecified breast: Secondary | ICD-10-CM

## 2017-03-22 DIAGNOSIS — I10 Essential (primary) hypertension: Secondary | ICD-10-CM | POA: Diagnosis not present

## 2017-03-22 LAB — CBC WITH DIFFERENTIAL/PLATELET
BASOS PCT: 1 %
Basophils Absolute: 0.1 10*3/uL (ref 0.0–0.1)
EOS ABS: 0.1 10*3/uL (ref 0.0–0.5)
EOS PCT: 1 %
HEMATOCRIT: 43.5 % (ref 34.8–46.6)
Hemoglobin: 14.5 g/dL (ref 11.6–15.9)
Lymphocytes Relative: 27 %
Lymphs Abs: 3 10*3/uL (ref 0.9–3.3)
MCH: 30.7 pg (ref 25.1–34.0)
MCHC: 33.2 g/dL (ref 31.5–36.0)
MCV: 92.5 fL (ref 79.5–101.0)
MONO ABS: 0.4 10*3/uL (ref 0.1–0.9)
MONOS PCT: 4 %
Neutro Abs: 7.6 10*3/uL — ABNORMAL HIGH (ref 1.5–6.5)
Neutrophils Relative %: 67 %
Platelets: 420 10*3/uL — ABNORMAL HIGH (ref 145–400)
RBC: 4.71 MIL/uL (ref 3.70–5.45)
RDW: 13.9 % (ref 11.2–14.5)
WBC: 11.2 10*3/uL — ABNORMAL HIGH (ref 3.9–10.3)

## 2017-03-22 LAB — COMPREHENSIVE METABOLIC PANEL
ALBUMIN: 3.8 g/dL (ref 3.5–5.0)
ALT: 17 U/L (ref 0–55)
ANION GAP: 10 (ref 3–11)
AST: 24 U/L (ref 5–34)
Alkaline Phosphatase: 114 U/L (ref 40–150)
BILIRUBIN TOTAL: 0.5 mg/dL (ref 0.2–1.2)
BUN: 11 mg/dL (ref 7–26)
CO2: 26 mmol/L (ref 22–29)
Calcium: 10.2 mg/dL (ref 8.4–10.4)
Chloride: 103 mmol/L (ref 98–109)
Creatinine, Ser: 0.95 mg/dL (ref 0.60–1.10)
GFR calc Af Amer: >60 mL/min (ref >60)
GFR calc non Af Amer: >60 mL/min (ref >60)
GLUCOSE: 242 mg/dL — AB (ref 70–140)
POTASSIUM: 4 mmol/L (ref 3.5–5.1)
Sodium: 139 mmol/L (ref 136–145)
TOTAL PROTEIN: 8.7 g/dL — AB (ref 6.4–8.3)

## 2017-03-22 MED ORDER — TAMOXIFEN CITRATE 20 MG PO TABS: 20.0000 mg | ORAL_TABLET | Freq: Every day | ORAL | 11 refills | Status: DC

## 2017-03-23 ENCOUNTER — Telehealth: Payer: Self-pay | Admitting: Hematology

## 2017-03-23 NOTE — Telephone Encounter (Signed)
Left voicemail for patient regarding appointment D/T per 1/31 los

## 2017-03-24 NOTE — Addendum Note (Signed)
Addended by: Truitt Merle on: 03/24/2017 12:17 AM   Modules accepted: Orders

## 2017-03-28 ENCOUNTER — Encounter: Payer: Self-pay | Admitting: Endocrinology

## 2017-03-28 ENCOUNTER — Ambulatory Visit: Payer: BC Managed Care – PPO | Admitting: Endocrinology

## 2017-03-28 VITALS — BP 132/84 | HR 85 | Ht 63.0 in | Wt 204.4 lb

## 2017-03-28 DIAGNOSIS — E1165 Type 2 diabetes mellitus with hyperglycemia: Secondary | ICD-10-CM | POA: Insufficient documentation

## 2017-03-28 LAB — GLUCOSE, POCT (MANUAL RESULT ENTRY): POC GLUCOSE: 164 mg/dL — AB (ref 70–99)

## 2017-03-28 MED ORDER — EMPAGLIFLOZIN 25 MG PO TABS: 25.0000 mg | ORAL_TABLET | Freq: Every day | ORAL | 2 refills | Status: DC

## 2017-03-28 NOTE — Patient Instructions (Signed)
Check blood sugars on waking up  2-3/7  Also check blood sugars about 2 hours after a meal and do this after different meals by rotation  Recommended blood sugar levels on waking up is 90-130 and about 2 hours after meal is 130-160  Please bring your blood sugar monitor to each visit, thank you   

## 2017-03-28 NOTE — Progress Notes (Signed)
Patient ID: Monica Clark, female   DOB: 12/26/1955, 62 y.o.   MRN: [id number]           Reason for Appointment: Follow-up for Type 2 Diabetes  Referring physician: [name]   History of Present Illness:          Date of diagnosis of type 2 diabetes mellitus: 2010       Background history:   She is not clear when her diabetes for diagnosis but had a high A1c in 2010 She has been on various oral medications over the last few years including Januvia  Amaryl was probably started in 2015 in addition to metformin She thinks her control was good only in the first few years, A1c 2016 was 9.5  Recent history:       She has not been seen in follow-up since 9/18  Her A1c is in December 15.5, previously was 11.1 and usually over 10  Non-insulin hypoglycemic drugs the patient is taking are: Metformin 2-3, Amaryl 4 mg daily,, Jardiance 25 mg daily  Current management, blood sugar patterns and problems identified:  She is again totally irregular with her follow-up, medication compliance, monitoring and continues to have inadequate control  She was without medication for some time in December when A1c was 15+  Although she was given a One touch Verio meter twice she could not use this and is now using the ultra mini monitor  More recently she has been able to take her Ozempic regularly, previously only on 4.50 mg Trulicity  She thinks her blood sugars have recently come down from before but she has been out of her Jardiance for a week  Also not clear whether she was taking 10 or 25 mg at this, her prescription list shows 10 mg  She thinks her blood sugars are about 140 in the morning and 160 or so before supper time  Does not check POSTPRANDIAL readings  She has however lost weight since her last visit       Side effects from medications have been: Mild diarrhea from regular metformin  Compliance with the medical regimen: Fair Hypoglycemia: None    Glucose monitoring:  done  0-1  times a day         Glucometer:  One Touch Ultra  Blood Glucose readings by recall as above  Self-care: The diet that the patient has been following is: tries to limit carbs.     Typical meal intake: Breakfast is Egg, fruit.  Lunch is chicken, vegetables, fruit and dinner is starch, meat and fruit.  Snacks: Granola bar and fruit               Dietician visit, most recent: 2013               Exercise: some walking recently    Weight history:209-229  Wt Readings from Last 3 Encounters:  03/28/17 204 lb 6.4 oz (92.7 kg)  03/22/17 203 lb 12.8 oz (92.4 kg)  11/02/16 214 lb (97.1 kg)    Glycemic control:   Lab Results  Component Value Date   HGBA1C 15.5 02/09/2017   HGBA1C 11.1 (H) 10/26/2016   HGBA1C (H) 10/24/2008    7.2 (NOTE) The ADA recommends the following therapeutic goal for glycemic control related to Hgb A1c measurement: Goal of therapy: <6.5 Hgb A1c  Reference: American Diabetes Association: Clinical Practice Recommendations 2010, Diabetes Care, 2010, 33: (Suppl  1).   Lab Results  Component Value Date   [facility]  0.8 10/26/2016   LDLCALC (H) 10/24/2008    157        Total Cholesterol/HDL:CHD Risk Coronary Heart Disease Risk Table                     Men   Women  1/2 Average Risk   3.4   3.3  Average Risk       5.0   4.4  2 X Average Risk   9.6   7.1  3 X Average Risk  23.4   11.0        Use the calculated Patient Ratio above and the CHD Risk Table to determine the patient's CHD Risk.        ATP III CLASSIFICATION (LDL):  <100     mg/dL   Optimal  100-129  mg/dL   Near or Above                    Optimal  130-159  mg/dL   Borderline  160-189  mg/dL   High  >190     mg/dL   Very High   CREATININE 0.95 03/22/2017     Lab Results  Component Value Date   MICRALBCREAT 1.5 10/26/2016       Allergies as of 03/28/2017      Reactions   Codeine Other (See Comments)   INSOMNIA      Medication List        Accurate as of 03/28/17  9:03 PM. Always  use your most recent med list.          glimepiride 4 MG tablet Commonly known as:  AMARYL Take 4 mg by mouth daily with breakfast.   metFORMIN 500 MG 24 hr tablet Commonly known as:  GLUCOPHAGE-XR Take 4 tablets (2,000 mg total) by mouth daily with supper.   PHARMACIST CHOICE NO CODING test strip Generic drug:  glucose blood   glucose blood test strip Commonly known as:  ONE TOUCH TEST STRIPS Use as instructed one time a day   Semaglutide 0.25 or 0.5 MG/DOSE Sopn Commonly known as:  OZEMPIC Inject 0.5 mg into the skin once a week.   tamoxifen 20 MG tablet Commonly known as:  NOLVADEX Take 1 tablet (20 mg total) by mouth daily.   triamterene-hydrochlorothiazide 37.5-25 MG tablet Commonly known as:  MAXZIDE-25 Take 1 tablet by mouth every morning. Reported on 09/03/2015       Allergies:  Allergies  Allergen Reactions  . Codeine Other (See Comments)    INSOMNIA    Past Medical History:  Diagnosis Date  . Breast mass, left    duct mass  . Endometrial hyperplasia   . Heart murmur    states no known problems, has never seen a cardiologist  . Hypertension    states under control with med., has been on med. x 5 yr.  . Immature cataract   . 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 Nipple discharge 04/2014   left breast  . Non-insulin dependent type 2 diabetes mellitus [facility])     Past Surgical History:  Procedure Laterality Date  . BREAST BIOPSY Left 04/30/2014   Procedure: LEFT BREAST BIOPSY;  Surgeon: Jackolyn Confer, MD;  Location: [place];  Service: General;  Laterality: Left;  . BREAST DUCTAL SYSTEM EXCISION Left 04/30/2014   Procedure: LEFT NIPPLE DUCT EXCISION;  Surgeon: Jackolyn Confer, MD;  Location: [place];  Service: General;  Laterality: Left;  . CESAREAN  SECTION  1982  . CHOLECYSTECTOMY  1989  . ROBOTIC ASSISTED TOTAL HYSTERECTOMY WITH BILATERAL SALPINGO OOPHERECTOMY Bilateral 10/13/2014   Procedure: ROBOTIC  ASSISTED TOTAL HYSTERECTOMY WITH BILATERAL SALPINGO OOPHORECTOMY ;  Surgeon: Everitt Amber, MD;  Location: [facility];  Service: Gynecology;  Laterality: Bilateral;    Family History  Problem Relation Age of Onset  . Diabetes Mother   . Heart disease Father   . Cancer Father        throat cancer   . Diabetes Sister   . Diabetes Brother     Social History:  reports that  has never smoked. she has never used smokeless tobacco. She reports that she drinks alcohol. She reports that she does not use drugs.   Review of Systems  Respiratory: Positive for daytime sleepiness.      Lipid history: LDL 95  Not clear if she has had any treatment from PCP recently, previously had taken pravastatin    Lab Results  Component Value Date   CHOL 211 (H) 10/26/2016   HDL 32.70 (L) 10/26/2016   LDLCALC (H) 10/24/2008    157        Total Cholesterol/HDL:CHD Risk Coronary Heart Disease Risk Table                     Men   Women  1/2 Average Risk   3.4   3.3  Average Risk       5.0   4.4  2 X Average Risk   9.6   7.1  3 X Average Risk  23.4   11.0        Use the calculated Patient Ratio above and the CHD Risk Table to determine the patient's CHD Risk.        ATP III CLASSIFICATION (LDL):  <100     mg/dL   Optimal  100-129  mg/dL   Near or Above                    Optimal  130-159  mg/dL   Borderline  160-189  mg/dL   High  >190     mg/dL   Very High   LDLDIRECT 95.0 10/26/2016   TRIG (H) 10/26/2016    467.0 Triglyceride is over 400; calculations on Lipids are invalid.   CHOLHDL 6 10/26/2016           Hypertension: Present, currently taking Maxzide And amlodipine   Most recent foot exam: 7/17    LABS:  Appointment on 03/22/2017  Component Date Value Ref Range Status  . WBC 03/22/2017 11.2* 3.9 - 10.3 K/uL Final  . RBC 03/22/2017 4.71  3.70 - 5.45 MIL/uL Final  . Hemoglobin 03/22/2017 14.5  11.6 - 15.9 g/dL Final  . HCT 03/22/2017 43.5  34.8 - 46.6 % Final  . MCV 03/22/2017  92.5  79.5 - 101.0 fL Final  . MCH 03/22/2017 30.7  25.1 - 34.0 pg Final  . MCHC 03/22/2017 33.2  31.5 - 36.0 g/dL Final  . RDW 03/22/2017 13.9  11.2 - 14.5 % Final  . Platelets 03/22/2017 420* 145 - 400 K/uL Final  . Neutrophils Relative % 03/22/2017 67  % Final  . Neutro Abs 03/22/2017 7.6* 1.5 - 6.5 K/uL Final  . Lymphocytes Relative 03/22/2017 27  % Final  . Lymphs Abs 03/22/2017 3.0  0.9 - 3.3 K/uL Final  . Monocytes Relative 03/22/2017 4  % Final  . Monocytes Absolute 03/22/2017 0.4  0.1 - 0.9  K/uL Final  . Eosinophils Relative 03/22/2017 1  % Final  . Eosinophils Absolute 03/22/2017 0.1  0.0 - 0.5 K/uL Final  . Basophils Relative 03/22/2017 1  % Final  . Basophils Absolute 03/22/2017 0.1  0.0 - 0.1 K/uL Final   Performed at [facility], [place] [address] [place],  [address]  . Sodium 03/22/2017 139  136 - 145 mmol/L Final  . Potassium 03/22/2017 4.0  3.5 - 5.1 mmol/L Final  . Chloride 03/22/2017 103  98 - 109 mmol/L Final  . CO2 03/22/2017 26  22 - 29 mmol/L Final  . Glucose, Bld 03/22/2017 242* 70 - 140 mg/dL Final  . BUN 03/22/2017 11  7 - 26 mg/dL Final  . Creatinine, Ser 03/22/2017 0.95  0.60 - 1.10 mg/dL Final  . Calcium 03/22/2017 10.2  8.4 - 10.4 mg/dL Final  . Total Protein 03/22/2017 8.7* 6.4 - 8.3 g/dL Final  . Albumin 03/22/2017 3.8  3.5 - 5.0 g/dL Final  . AST 03/22/2017 24  5 - 34 U/L Final  . ALT 03/22/2017 17  0 - 55 U/L Final  . Alkaline Phosphatase 03/22/2017 114  40 - 150 U/L Final  . Total Bilirubin 03/22/2017 0.5  0.2 - 1.2 mg/dL Final  . GFR calc non Af Amer 03/22/2017 >60  >60 mL/min Final  . GFR calc Af Amer 03/22/2017 >60  >60 mL/min Final   Comment: (NOTE) The eGFR has been calculated using the CKD EPI equation. This calculation has not been validated in all clinical situations. eGFR's persistently <60 mL/min signify possible Chronic Kidney Disease.   Georgiann Hahn gap 03/22/2017 10  3 - 11 Final   Performed at [facility], [place] [address], [place],  [address]    Physical Examination:  BP 132/84 (BP Location: Left Arm, Patient Position: Sitting, Cuff Size: Large)   Pulse 85   Ht '5\' 3"'$  (1.6 m)   Wt 204 lb 6.4 oz (92.7 kg)   SpO2 97%   BMI 36.21 kg/m          ASSESSMENT:  Diabetes type 2, uncontrolled With obesity     She has had persistently poor control of diabetes    Although A1c was as much as 15+ in December she appears to have better blood sugars now and recent readings appear to be mostly around 140-200 Most likely has high postprandial readings which she does not monitor Also may benefit from restarting her Jardiance which she has not taken recently   HYPERCHOLESTEROLEMIA: LDL below 100 currently  PLAN:    She agrees to see the dietitian for better meal planning She will start checking sugars after meals since she may have postprandial readings and this will also possibly help her with better diet She is reporting fairly good fasting readings and will not need to consider basal insulin at this time No change in Ozempic 0.5 mg weekly She does need to start exercising consistently  Continue Jardiance but make sure she gets 25 mg per  She needs to be regular with her follow-up We will review her control again in 6 weeks  Counseling time on subjects discussed in assessment and plan sections is over 50% of today's 25 minute visit    Patient Instructions  Check blood sugars on waking up 2-3/7   Also check blood sugars about 2 hours after a meal and do this after different meals by rotation  Recommended blood sugar levels on waking up is  90-130 and about 2 hours after meal is 130-160  Please bring your blood sugar monitor to each visit, thank you     Elayne Snare 03/28/2017, 9:03 PM   Note: This office note was prepared with Dragon voice recognition system technology. Any transcriptional errors that result from this process are  unintentional.

## 2017-05-05 ENCOUNTER — Other Ambulatory Visit: Payer: Self-pay | Admitting: Hematology

## 2017-05-05 DIAGNOSIS — C541 Malignant neoplasm of endometrium: Secondary | ICD-10-CM

## 2017-05-07 ENCOUNTER — Other Ambulatory Visit: Payer: BC Managed Care – PPO

## 2017-05-09 ENCOUNTER — Ambulatory Visit: Payer: BC Managed Care – PPO | Admitting: Endocrinology

## 2017-06-20 ENCOUNTER — Ambulatory Visit: Payer: BC Managed Care – PPO | Admitting: Endocrinology

## 2017-06-20 ENCOUNTER — Encounter: Payer: Self-pay | Admitting: Endocrinology

## 2017-06-20 VITALS — BP 130/82 | HR 78 | Ht 63.0 in | Wt 202.0 lb

## 2017-06-20 DIAGNOSIS — E1165 Type 2 diabetes mellitus with hyperglycemia: Secondary | ICD-10-CM

## 2017-06-20 DIAGNOSIS — I10 Essential (primary) hypertension: Secondary | ICD-10-CM | POA: Diagnosis not present

## 2017-06-20 LAB — POCT GLYCOSYLATED HEMOGLOBIN (HGB A1C): HEMOGLOBIN A1C: 8.2

## 2017-06-20 MED ORDER — INSULIN PEN NEEDLE 31G X 5 MM MISC: 1 refills | Status: AC

## 2017-06-20 MED ORDER — INSULIN GLARGINE-LIXISENATIDE 100-33 UNT-MCG/ML ~~LOC~~ SOPN: 30.0000 [IU] | PEN_INJECTOR | Freq: Every day | SUBCUTANEOUS | 1 refills | Status: DC

## 2017-06-20 NOTE — Patient Instructions (Addendum)
SOLIQUA  Insulin:  This insulin provides blood sugar control for up to 24 hours.   Start with 15 units at [place] daily and increase by 2 units every 3 days until the waking up sugars are under 130. Then continue the same dose.  If blood sugar is under 90 for 2 days in a row, reduce the dose by 2 units.  Note that this insulin does not control the rise of blood sugar with meals    wALK DAILY  sTOP OZEMPIC

## 2017-06-20 NOTE — Progress Notes (Signed)
Patient ID: Monica Clark, female   DOB: August 25, 1955, 62 y.o.   MRN: [id number]           Reason for Appointment: Follow-up for Type 2 Diabetes  Referring physician: [name]   History of Present Illness:          Date of diagnosis of type 2 diabetes mellitus: 2010       Background history:   She is not clear when her diabetes for diagnosis but had a high A1c in 2010 She has been on various oral medications over the last few years including Januvia  Amaryl was probably started in 2015 in addition to metformin She thinks her control was good only in the first few years, A1c 2016 was 9.5 She was without medication for some time in December 2018 when A1c was 15+  Recent history:        Her A1c is relatively better at 8.2, previously and December 15.5 and usually over 10  Non-insulin hypoglycemic drugs: Metformin ER 3-4, Amaryl 4 mg daily,, Jardiance 25 mg daily, Ozempic 0.5 mg weekly  Current management, blood sugar patterns and problems identified:  Despite taking multiple drugs including Ozempic her blood sugars are still high  She has missed about 1 Ozempic injection in the last month  However she is trying to take her oral medications regularly including metformin ER up to 4 tablets daily  She has not done readings after meals despite repeated reminders and mostly checking in the morning  Most of her blood sugars are over 200 in the morning with only a couple of readings recently lower  She has really not made significant efforts to lose weight  She says she has had some nausea but she does not think it is from Ozempic       Side effects from medications have been: Mild diarrhea from regular metformin  Compliance with the medical regimen: Fair Hypoglycemia: None    Glucose monitoring:  done 0-1  times a day         Glucometer:  One Touch Ultra  Blood Glucose readings by download  FASTING blood sugar range 164-253, MEDIAN 219 Lunchtime 160, 252 1 AM  237  Self-care: The diet that the patient has been following is: tries to limit carbs.     Typical meal intake: Breakfast is Egg, fruit.  Lunch is chicken, vegetables, fruit and dinner is starch, meat and fruit.  Snacks: Granola bar and fruit               Dietician visit, most recent: 2013               Exercise: some walking recently    Weight history:209-229  Wt Readings from Last 3 Encounters:  06/20/17 202 lb (91.6 kg)  03/28/17 204 lb 6.4 oz (92.7 kg)  03/22/17 203 lb 12.8 oz (92.4 kg)    Glycemic control:   Lab Results  Component Value Date   HGBA1C 8.2 06/20/2017   HGBA1C 15.5 02/09/2017   HGBA1C 11.1 (H) 10/26/2016   Lab Results  Component Value Date   MICROALBUR 0.8 10/26/2016   LDLCALC (H) 10/24/2008    157        Total Cholesterol/HDL:CHD Risk Coronary Heart Disease Risk Table                     Men   Women  1/2 Average Risk   3.4   3.3  Average Risk  5.0   4.4  2 X Average Risk   9.6   7.1  3 X Average Risk  23.4   11.0        Use the calculated Patient Ratio above and the CHD Risk Table to determine the patient's CHD Risk.        ATP III CLASSIFICATION (LDL):  <100     mg/dL   Optimal  100-129  mg/dL   Near or Above                    Optimal  130-159  mg/dL   Borderline  160-189  mg/dL   High  >190     mg/dL   Very High   CREATININE 0.95 03/22/2017     Lab Results  Component Value Date   MICRALBCREAT 1.5 10/26/2016       Allergies as of 06/20/2017      Reactions   Codeine Other (See Comments)   INSOMNIA      Medication List        Accurate as of 06/20/17  4:20 PM. Always use your most recent med list.          empagliflozin 25 MG Tabs tablet Commonly known as:  JARDIANCE Take 25 mg by mouth daily.   glimepiride 4 MG tablet Commonly known as:  AMARYL Take 4 mg by mouth daily with breakfast.   glucose blood test strip Commonly known as:  ONE TOUCH TEST STRIPS Use as instructed one time a day   metFORMIN 500 MG 24 hr  tablet Commonly known as:  GLUCOPHAGE-XR Take 4 tablets (2,000 mg total) by mouth daily with supper.   Semaglutide 0.25 or 0.5 MG/DOSE Sopn Commonly known as:  OZEMPIC Inject 0.5 mg into the skin once a week.   tamoxifen 20 MG tablet Commonly known as:  NOLVADEX Take 1 tablet (20 mg total) by mouth daily.   triamterene-hydrochlorothiazide 37.5-25 MG tablet Commonly known as:  MAXZIDE-25 Take 1 tablet by mouth every morning. Reported on 09/03/2015       Allergies:  Allergies  Allergen Reactions  . Codeine Other (See Comments)    INSOMNIA    Past Medical History:  Diagnosis Date  . Breast mass, left    duct mass  . Endometrial hyperplasia   . Heart murmur    states no known problems, has never seen a cardiologist  . Hypertension    states under control with med., has been on med. x 5 yr.  . Immature cataract   . 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 Nipple discharge 04/2014   left breast  . Non-insulin dependent type 2 diabetes mellitus [facility])     Past Surgical History:  Procedure Laterality Date  . BREAST BIOPSY Left 04/30/2014   Procedure: LEFT BREAST BIOPSY;  Surgeon: Jackolyn Confer, MD;  Location: [place];  Service: General;  Laterality: Left;  . BREAST DUCTAL SYSTEM EXCISION Left 04/30/2014   Procedure: LEFT NIPPLE DUCT EXCISION;  Surgeon: Jackolyn Confer, MD;  Location: [place];  Service: General;  Laterality: Left;  . [place]  . CHOLECYSTECTOMY  1989  . ROBOTIC ASSISTED TOTAL HYSTERECTOMY WITH BILATERAL SALPINGO OOPHERECTOMY Bilateral 10/13/2014   Procedure: ROBOTIC ASSISTED TOTAL HYSTERECTOMY WITH BILATERAL SALPINGO OOPHORECTOMY ;  Surgeon: Everitt Amber, MD;  Location[facility];  Service: Gynecology;  Laterality: Bilateral;    Family History  Problem Relation Age of Onset  . Diabetes Mother   .  Heart disease Father   . Cancer Father        throat cancer   . Diabetes Sister   . Diabetes Brother      Social History:  reports that she has never smoked. She has never used smokeless tobacco. She reports that she drinks alcohol. She reports that she does not use drugs.   Review of Systems  Respiratory: Positive for daytime sleepiness.      Lipid history: LDL 95  Not on any treatment from PCP recently, previously had taken pravastatin    Lab Results  Component Value Date   CHOL 211 (H) 10/26/2016   HDL 32.70 (L) 10/26/2016   LDLCALC (H) 10/24/2008    157        Total Cholesterol/HDL:CHD Risk Coronary Heart Disease Risk Table                     Men   Women  1/2 Average Risk   3.4   3.3  Average Risk       5.0   4.4  2 X Average Risk   9.6   7.1  3 X Average Risk  23.4   11.0        Use the calculated Patient Ratio above and the CHD Risk Table to determine the patient's CHD Risk.        ATP III CLASSIFICATION (LDL):  <100     mg/dL   Optimal  100-129  mg/dL   Near or Above                    Optimal  130-159  mg/dL   Borderline  160-189  mg/dL   High  >190     mg/dL   Very High   LDLDIRECT 95.0 10/26/2016   TRIG (H) 10/26/2016    467.0 Triglyceride is over 400; calculations on Lipids are invalid.   CHOLHDL 6 10/26/2016           Hypertension: Currently treated with only Maxzide And not on amlodipine   Most recent foot exam: 7/17    LABS:  Office Visit on 06/20/2017  Component Date Value Ref Range Status  . Hemoglobin A1C 06/20/2017 8.2   Final    Physical Examination:  BP 130/82   Pulse 78   Ht 5\' 3"  (1.6 m)   Wt 202 lb (91.6 kg)   SpO2 97%   BMI 35.78 kg/m          ASSESSMENT:  Diabetes type 2, uncontrolled with recent BMI of 36     She has had persistently poor control of diabetes   She is on a 4 drug regimen including Jardiance and Ozempic along with Amaryl and metformin Has persistently high readings even with usually good compliance with her medication Diet has been variable and her exercise regimen can be improved also She has  not seen the dietitian as requested   Her fasting readings are averaging over 200 indicating insulin deficiency She may also be having postprandial hyperglycemia but she is not checking postprandial readings currently Again has difficulty losing weight  HYPERTENSION: Blood pressures controlled with Maxide only along with Jardiance Will need repeat renal function to monitor her treatment  PLAN:    She will need to be on insulin and at least basal insulin to start with since she is not completely benefiting from current regimen including Ozempic  For simplicity we will have her start a combination of GLP-1 and insulin  using [place] and explained to her how this works, since her GLP-1 dose will be likely smaller than Ozempic she should not have any significant GI side effects with this  Discussed actions of basal insulin, timing of injection and duration of action  She will start the [facility] with the Solostar pen once a day in the morning and start with 16 units  Given patient information brochure and co-pay card  She will increase her dose by 2 units every 3 days and given her flowsheet to help her keep a record of her blood sugars in the morning and titration regimen  She does need to check readings after meals consistently  No regular exercise with walking daily  Consistent diet  She will make an appointment with a dietitian for meal planning  Follow-up again in 6 weeks with fructosamine    Patient Instructions  SOLIQUA  Insulin:  This insulin provides blood sugar control for up to 24 hours.   Start with 15 units at [place] daily and increase by 2 units every 3 days until the waking up sugars are under 130. Then continue the same dose.  If blood sugar is under 90 for 2 days in a row, reduce the dose by 2 units.  Note that this insulin does not control the rise of blood sugar with meals    wALK DAILY  sTOP OZEMPIC   Counseling time on subjects discussed in assessment  and plan sections is over 50% of today's 25 minute visit  Elayne Snare 06/20/2017, 4:20 PM   Note: This office note was prepared with Dragon voice recognition system technology. Any transcriptional errors that result from this process are unintentional.

## 2017-06-26 ENCOUNTER — Ambulatory Visit: Payer: BC Managed Care – PPO | Admitting: Dietician

## 2017-07-05 ENCOUNTER — Other Ambulatory Visit: Payer: Self-pay | Admitting: Endocrinology

## 2017-07-10 ENCOUNTER — Encounter: Payer: BC Managed Care – PPO | Attending: Endocrinology | Admitting: Dietician

## 2017-07-10 ENCOUNTER — Encounter: Payer: Self-pay | Admitting: Dietician

## 2017-07-10 DIAGNOSIS — E1165 Type 2 diabetes mellitus with hyperglycemia: Secondary | ICD-10-CM

## 2017-07-10 DIAGNOSIS — Z713 Dietary counseling and surveillance: Secondary | ICD-10-CM | POA: Diagnosis present

## 2017-07-10 NOTE — Patient Instructions (Addendum)
SOLIQUA  Insulin:  This insulin provides blood sugar control for up to 24 hours.   Start with 15 units at [place] daily and increase by 2 units every 3 days until the waking up sugars are under 130. Then continue the same dose.  If blood sugar is under 90 for 2 days in a row, reduce the dose by 2 units.  Note that this insulin does not control the rise of blood sugar with meals    Stop the Ozempic and start in insulin as above per Dr. Ronnie Derby notes. Be sure to rotate injection sites.  Do not give in hard spots or bruises.  Look up the food you eat out to be mindful about your choices.  Consider the Calorie [employer]. Ask for your tea and smoothies without sugar. Consider a sleep study. Over the past 2 years, your sleep has improved continue to work on this. Where/when can you be more active.  Aim for 3 Carb Choices per meal (45 grams) +/- 1 either way  Aim for 0-1 Carbs per snack if hungry  Include protein in moderation with your meals and snacks Consider reading food labels for Total Carbohydrate and Fat Grams of foods Continue checking BG at alternate times per day as directed by MD  Continue taking medication  as directed by MD

## 2017-07-10 NOTE — Progress Notes (Signed)
Diabetes Self-Management Education  Visit Type: First/Initial  Appt. Start Time: 1630 Appt. End Time: 2035  07/10/2017  [name], identified by name and date of birth, is a 62 y.o. female with a diagnosis of Diabetes: Type 2.  Other history includes HTN. Medications include Jardiance glimipiride, ozempic, metformin. Patient of Dr. Dwyane Dee.  His note was reviewed.  She was to stop the Ozempic and start the [place].  She has not yet done this.  Reviewed insulin site rotation and how to start this and increase this based on her blood sugar level. Noted uncontrolled diabetes with A1C of 15.5% 02/09/17.  Discussed this with patient and she states that she was not taking her medication like she should and that she was very frustrated about the diabetes and was tired of taking all of the medication. Recently she was on a fast at her church and was eating more vegetables and fruit and no meat.  She noticed a blood sugar of 125 this am.  She had been eating more salads as well.   Of note- she does not sleep well.  5 hours per night currently.  This has improved from 2 hours 2 years ago when she was last seen by myself.  Weight 206 lbs today increased from 202 lbs 06/20/17.  She is frustrated by the fluctuation.  Her weight has decreased from 215 lbs 2 years ago.  I gave her a diabetic meal plan today that is appropriate for weight loss.  She is asking about multiple different diets.  Patient's son is going to move out soon.  This is concerning to patient as he goes to the gym and is a good influence for her.  She works as a Company secretary at [employer]   She states that she has increased stress.  Today she requested increased information regarding breakfast and snack ideas.  She eats to try to get energy as she is always tired. ASSESSMENT  Height 5\' 3"  (1.6 m), weight 206 lb (93.4 kg). Body mass index is 36.49 kg/m.  Diabetes Self-Management Education - 07/10/17 1648       Visit Information   Visit Type  First/Initial      Initial Visit   Diabetes Type  Type 2    Are you currently following a meal plan?  No    Are you taking your medications as prescribed?  No    Date Diagnosed  2010      Health Coping   How would you rate your overall health?  Fair      Psychosocial Assessment   Patient Belief/Attitude about Diabetes  Motivated to manage diabetes    Self-care barriers  None    Self-management support  Doctor's office    Other persons present  Patient    Patient Concerns  Nutrition/Meal planning;Glycemic Control;Weight Control    Special Needs  None    Preferred Learning Style  No preference indicated    Learning Readiness  Ready    How often do you need to have someone help you when you read instructions, pamphlets, or other written materials from your doctor or pharmacy?  1 - Never    What is the last grade level you completed in school?  2 years college      Pre-Education Assessment   Patient understands the diabetes disease and treatment process.  Needs Review    Patient understands incorporating nutritional management into lifestyle.  Needs Review    Patient undertands incorporating physical  activity into lifestyle.  Needs Review    Patient understands using medications safely.  Needs Review    Patient understands monitoring blood glucose, interpreting and using results  Needs Review    Patient understands prevention, detection, and treatment of acute complications.  Needs Review    Patient understands prevention, detection, and treatment of chronic complications.  Needs Review    Patient understands how to develop strategies to address psychosocial issues.  Needs Review    Patient understands how to develop strategies to promote health/change behavior.  Needs Review      Complications   Last HgB A1C per patient/outside source  8.2 % 06/20/17 decreased from 15.5% 02/09/17    How often do you check your blood sugar?  1-2 times/day    Fasting  Blood glucose range (mg/dL)  130-179;70-129    Postprandial Blood glucose range (mg/dL)  180-200    Number of hypoglycemic episodes per month  0    Number of hyperglycemic episodes per week  0    Have you had a dilated eye exam in the past 12 months?  Yes    Have you had a dental exam in the past 12 months?  No    Are you checking your feet?  Yes    How many days per week are you checking your feet?  1      Dietary Intake   Breakfast  skips OR apple or mandarine orange or grapefruit and occasional [employer] toast with peanut butter.    Snack (morning)  nuts (hot or honey roasted)    Lunch  salad with egg, cheese, light italian OR vegetable plate sometimes with cornbread or biscuit (out)    Snack (afternoon)  popcorn or fruit    Dinner  vegetable plate OR chipolte chicken bowl OR sandwich from [employer]    Snack (evening)  fruit, ice cream or cookies etc.    Beverage(s)  water with flavor packets, occasional sweet tea on weekends, smoothie once per week      Exercise   Exercise Type  Light (walking / raking leaves)    How many days per week to you exercise?  1 exercises at home.  has a gym membership but does not use.    How many minutes per day do you exercise?  15    Total minutes per week of exercise  15      Patient Education   Previous Diabetes Education  Yes (please comment) with this RD 2017    Disease state   Other (comment) review    Nutrition management   Role of diet in the treatment of diabetes and the relationship between the three main macronutrients and blood glucose level;Meal options for control of blood glucose level and chronic complications.;Meal timing in regards to the patients' current diabetes medication.;Information on hints to eating out and maintain blood glucose control.    Physical activity and exercise   Role of exercise on diabetes management, blood pressure control and cardiac health.    Medications  Reviewed patients medication for diabetes, action,  purpose, timing of dose and side effects.    Monitoring  Purpose and frequency of SMBG.;Daily foot exams    Acute complications  Taught treatment of hypoglycemia - the 15 rule.    Psychosocial adjustment  Worked with patient to identify barriers to care and solutions;Role of stress on diabetes    Personal strategies to promote health  Lifestyle issues that need to be addressed for better diabetes care  Individualized Goals (developed by patient)   Nutrition  General guidelines for healthy choices and portions discussed    Physical Activity  Exercise 3-5 times per week;15 minutes per day    Medications  take my medication as prescribed    Monitoring   test my blood glucose as discussed    Problem Solving  meal planning    Reducing Risk  examine blood glucose patterns    Health Coping  discuss diabetes with (comment)      Post-Education Assessment   Patient understands the diabetes disease and treatment process.  Demonstrates understanding / competency    Patient understands incorporating nutritional management into lifestyle.  Needs Review    Patient undertands incorporating physical activity into lifestyle.  Demonstrates understanding / competency    Patient understands using medications safely.  Demonstrates understanding / competency    Patient understands monitoring blood glucose, interpreting and using results  Demonstrates understanding / competency    Patient understands prevention, detection, and treatment of acute complications.  Demonstrates understanding / competency    Patient understands prevention, detection, and treatment of chronic complications.  Demonstrates understanding / competency    Patient understands how to develop strategies to address psychosocial issues.  Needs Review    Patient understands how to develop strategies to promote health/change behavior.  Needs Review      Outcomes   Expected Outcomes  Demonstrated interest in learning. Expect positive outcomes     Future DMSE  2 months    Program Status  Not Completed       Individualized Plan for Diabetes Self-Management Training:   Learning Objective:  Patient will have a greater understanding of diabetes self-management. Patient education plan is to attend individual and/or group sessions per assessed needs and concerns.   Plan:   Patient Instructions  SOLIQUA  Insulin:  This insulin provides blood sugar control for up to 24 hours.   Start with 15 units at [place] daily and increase by 2 units every 3 days until the waking up sugars are under 130. Then continue the same dose.  If blood sugar is under 90 for 2 days in a row, reduce the dose by 2 units.  Note that this insulin does not control the rise of blood sugar with meals    Stop the Ozempic and start in insulin as above per Dr. Ronnie Derby notes. Be sure to rotate injection sites.  Do not give in hard spots or bruises.  Look up the food you eat out to be mindful about your choices.  Consider the Calorie [employer]. Ask for your tea and smoothies without sugar. Consider a sleep study. Over the past 2 years, your sleep has improved continue to work on this. Where/when can you be more active.  Aim for 3 Carb Choices per meal (45 grams) +/- 1 either way  Aim for 0-1 Carbs per snack if hungry  Include protein in moderation with your meals and snacks Consider reading food labels for Total Carbohydrate and Fat Grams of foods Continue checking BG at alternate times per day as directed by MD  Continue taking medication  as directed by MD        Expected Outcomes:  Demonstrated interest in learning. Expect positive outcomes  Education material provided: Food label handouts, A1C conversion sheet, Meal plan card and Snack sheet, power plate  If problems or questions, patient to contact team via:  Phone  Future DSME appointment: 2 months

## 2017-07-25 ENCOUNTER — Other Ambulatory Visit: Payer: BC Managed Care – PPO

## 2017-08-01 ENCOUNTER — Ambulatory Visit: Payer: BC Managed Care – PPO | Admitting: Endocrinology

## 2017-08-15 ENCOUNTER — Other Ambulatory Visit: Payer: Self-pay | Admitting: Endocrinology

## 2017-09-17 ENCOUNTER — Other Ambulatory Visit: Payer: BC Managed Care – PPO

## 2017-09-18 ENCOUNTER — Ambulatory Visit: Payer: BC Managed Care – PPO | Admitting: Endocrinology

## 2017-09-18 DIAGNOSIS — Z0289 Encounter for other administrative examinations: Secondary | ICD-10-CM

## 2017-11-14 ENCOUNTER — Other Ambulatory Visit: Payer: Self-pay | Admitting: Hematology

## 2017-11-14 DIAGNOSIS — C541 Malignant neoplasm of endometrium: Secondary | ICD-10-CM

## 2018-02-05 ENCOUNTER — Other Ambulatory Visit: Payer: Self-pay | Admitting: Hematology

## 2018-02-05 DIAGNOSIS — C541 Malignant neoplasm of endometrium: Secondary | ICD-10-CM

## 2018-02-08 ENCOUNTER — Other Ambulatory Visit: Payer: Self-pay | Admitting: Endocrinology

## 2018-03-22 ENCOUNTER — Inpatient Hospital Stay: Payer: BC Managed Care – PPO | Attending: Hematology

## 2018-03-22 ENCOUNTER — Inpatient Hospital Stay: Payer: BC Managed Care – PPO | Admitting: Hematology

## 2018-03-26 ENCOUNTER — Encounter (INDEPENDENT_AMBULATORY_CARE_PROVIDER_SITE_OTHER): Payer: BC Managed Care – PPO

## 2018-04-04 ENCOUNTER — Ambulatory Visit (INDEPENDENT_AMBULATORY_CARE_PROVIDER_SITE_OTHER): Payer: BC Managed Care – PPO | Admitting: Family Medicine

## 2018-04-04 ENCOUNTER — Encounter (INDEPENDENT_AMBULATORY_CARE_PROVIDER_SITE_OTHER): Payer: Self-pay

## 2018-04-16 ENCOUNTER — Encounter (INDEPENDENT_AMBULATORY_CARE_PROVIDER_SITE_OTHER): Payer: Self-pay | Admitting: Family Medicine

## 2018-04-16 ENCOUNTER — Ambulatory Visit (INDEPENDENT_AMBULATORY_CARE_PROVIDER_SITE_OTHER): Payer: BC Managed Care – PPO | Admitting: Family Medicine

## 2018-04-16 VITALS — BP 123/81 | HR 80 | Ht 63.0 in | Wt 216.0 lb

## 2018-04-16 DIAGNOSIS — Z1331 Encounter for screening for depression: Secondary | ICD-10-CM | POA: Diagnosis not present

## 2018-04-16 DIAGNOSIS — R0602 Shortness of breath: Secondary | ICD-10-CM | POA: Diagnosis not present

## 2018-04-16 DIAGNOSIS — R5383 Other fatigue: Secondary | ICD-10-CM | POA: Diagnosis not present

## 2018-04-16 DIAGNOSIS — E119 Type 2 diabetes mellitus without complications: Secondary | ICD-10-CM | POA: Diagnosis not present

## 2018-04-16 DIAGNOSIS — Z9189 Other specified personal risk factors, not elsewhere classified: Secondary | ICD-10-CM | POA: Diagnosis not present

## 2018-04-16 DIAGNOSIS — Z6838 Body mass index (BMI) 38.0-38.9, adult: Secondary | ICD-10-CM | POA: Diagnosis not present

## 2018-04-16 DIAGNOSIS — Z0289 Encounter for other administrative examinations: Secondary | ICD-10-CM

## 2018-04-16 NOTE — Progress Notes (Signed)
Office: [phone]  /  Fax: [phone]   Dear Dr. Maurice Clark,   Thank you for referring Monica Clark to our clinic. The following note includes my evaluation and treatment recommendations.  HPI:   Chief Complaint: OBESITY    Monica Clark has been referred by Dr. Maurice Clark for consultation regarding her obesity and obesity related comorbidities.    Monica Clark (MR# [id number]) is a 63 y.o. female who presents on 04/16/2018 for obesity evaluation and treatment. Current BMI is Body mass index is 38.26 kg/m.  Monica Clark has been struggling with her weight for many years and has been unsuccessful in either losing weight, maintaining weight loss, or reaching her healthy weight goal.     Monica Clark attended our information session and states she is currently in the action stage of change and ready to dedicate time achieving and maintaining a healthier weight. Monica Clark is interested in becoming our Monica Clark and working on intensive lifestyle modifications including (but not limited to) diet, exercise and weight loss.    Monica Clark states her family eats meals together her desired weight loss is 41 lbs she has been heavy most of her life she started gaining weight after marriage and childbirth her heaviest weight ever was 230 lbs. she has significant food cravings issues  she snacks frequently in the evenings she wakes up sometimes in the middle of the night to eat she is frequently drinking liquids with calories she frequently makes poor food choices she has binge eating behaviors she struggles with emotional eating    Monica Clark feels her energy is lower than it should be. This has worsened with weight gain and has not worsened recently. Monica Clark admits to daytime somnolence and admits to waking up still tired. Monica Clark is at risk for obstructive sleep apnea. Monica Clark has a history of symptoms of Epworth sleepiness scale. Monica Clark generally gets 4 or 5 hours of sleep per night, and states they  generally have restless sleep. Snoring is not present. Apneic episodes are not present. Epworth Sleepiness Score is 18.  Dyspnea on exertion Monica Clark notes increasing shortness of breath with exercising and seems to be worsening over time with weight gain. She notes getting out of breath sooner with activity than she used to. This has not gotten worse recently.Monica Clark denies orthopnea.  Diabetes II Monica Clark has a diagnosis of diabetes type II. [place] states Monica Clark does not check sugars at home. Her last A1c was 8.2 on 06/20/17 and was uncontrolled.   At risk for cardiovascular disease Monica Clark is at a higher than average risk for cardiovascular disease due to Monica, diabetes, and obesity.   Depression Screen Monica Clark's Food and Mood (modified PHQ-9) score was 14. Depression screen [facility] 2/9 04/16/2018  Decreased Interest 2  Down, Depressed, Hopeless 1  PHQ - 2 Score 3  Altered sleeping 1  Tired, decreased energy 3  Change in appetite 3  Feeling bad or failure about yourself  2  Trouble concentrating 2  Moving slowly or fidgety/restless 0  Suicidal thoughts 0  PHQ-9 Score 14  Difficult doing work/chores Somewhat difficult   ASSESSMENT AND PLAN:  Other Monica - Plan: EKG 12-Lead, Vitamin B12, CBC With Differential, Folate, T3, T4, free, TSH, VITAMIN D 25 Hydroxy (Vit-D Deficiency, Fractures)  Shortness of breath on exertion - Plan: Lipid Panel With LDL/HDL Ratio  Type 2 diabetes mellitus without complication, without long-term current use of insulin (HCC) - Plan: Comprehensive metabolic panel, Hemoglobin A1c, Insulin, random, Microalbumin / creatinine urine ratio  Depression screening  At risk for heart disease  Class 2 severe obesity with serious comorbidity and body mass index (BMI) of 38.0 to 38.9 in adult, unspecified obesity type ([place])  PLAN:  Monica Monica Clark was informed that her Monica may be related to obesity, depression or many other causes. Labs will be ordered, and in the  meanwhile Monica Clark has agreed to work on diet, exercise and weight loss to help with Monica. Proper sleep hygiene was discussed including the need for 7-8 hours of quality sleep each night. A sleep study was not ordered based on symptoms and Epworth score. An EKG and an indirect calorimetry was ordered today and Monica Clark agrees to follow up in 2 weeks.  Dyspnea on exertion Monica Clark's shortness of breath appears to be obesity related and exercise induced. She has agreed to work on weight loss and gradually increase exercise to treat her exercise induced shortness of breath. If Monica Clark follows our instructions and loses weight without improvement of her shortness of breath, we will plan to refer to pulmonology. We ordered an indirect calorimetry, an EKG, and labs today.We will monitor this condition regularly. Monica Clark agrees to this plan.  Diabetes II Monica Clark has been given extensive diabetes education by myself today including ideal fasting and post-prandial blood glucose readings, individual ideal Hgb A1c goals, and hypoglycemia prevention. We discussed the importance of good blood sugar control to decrease the likelihood of diabetic complications such as nephropathy, neuropathy, limb loss, blindness, coronary artery disease, and death. We discussed the importance of intensive lifestyle modification including diet, exercise and weight loss as the first line treatment for diabetes. Monica Clark agrees to check her blood sugars twice a day and continue her diabetes medications as prescribed. She will follow up at the agreed upon time in 2 weeks.  Cardiovascular risk counseling Monica Clark was given extended (15 minutes) coronary artery disease prevention counseling today. She is 63 y.o. female and has risk factors for heart disease including Monica, diabetes, and obesity. We discussed intensive lifestyle modifications today with an emphasis on specific weight loss instructions and strategies. Pt was also informed of the importance of  increasing exercise and decreasing saturated fats to help prevent heart disease.  Depression Screen Monica Clark had a moderately positive depression screening. Depression is commonly associated with obesity and often results in emotional eating behaviors. We will monitor this closely and work on CBT to help improve the non-hunger eating patterns. Referral to Psychology may be required if no improvement is seen as she continues in our clinic.  Obesity Monica Clark is currently in the action stage of change and her goal is to continue with weight loss efforts. I recommend Monica Clark begin the structured treatment plan as follows:  She has agreed to follow the Category 2 plan + 80 to 100 calorie greek yogurt. Monica Clark has been instructed to eventually work up to a goal of 150 minutes of combined cardio and strengthening exercise per week for weight loss and overall health benefits. We discussed the following Behavioral Modification Strategies today: increasing lean protein intake, decreasing simple carbohydrates, and keeping healthy foods in the home.   She was informed of the importance of frequent follow up visits to maximize her success with intensive lifestyle modifications for her multiple health conditions. She was informed we would discuss her lab results at her next visit unless there is a critical issue that needs to be addressed sooner. Monica Clark agreed to keep her next visit at the agreed upon time to discuss these results.  ALLERGIES: Allergies  Allergen Reactions  . Codeine Other (See Comments)    INSOMNIA    MEDICATIONS: Current Outpatient Medications on File Prior to Visit  Medication Sig Dispense Refill  . glimepiride (AMARYL) 4 MG tablet Take 4 mg by mouth daily with breakfast.    . glucose blood (ONE TOUCH TEST STRIPS) test strip Use as instructed one time a day 100 each 12  . Insulin Pen Needle 31G X 5 MM MISC Use once a day 100 each 1  . JARDIANCE 25 MG TABS tablet TAKE 1 TABLET BY MOUTH EVERY  DAY 30 tablet 2  . metFORMIN (GLUCOPHAGE-XR) 500 MG 24 hr tablet Take 4 tablets (2,000 mg total) by mouth daily with supper. 120 tablet 3  . SOLIQUA 100-33 UNT-MCG/ML SOPN INJECT 30 UNITS INTO THE SKIN DAILY WITH BREAKFAST. 3 pen 3  . tamoxifen (NOLVADEX) 20 MG tablet TAKE 1 TABLET BY MOUTH EVERY DAY 90 tablet 0  . triamterene-hydrochlorothiazide (MAXZIDE-25) 37.5-25 MG per tablet Take 1 tablet by mouth every morning. Reported on 09/03/2015     No current facility-administered medications on file prior to visit.     PAST MEDICAL HISTORY: Past Medical History:  Diagnosis Date  . Breast mass, left    duct mass  . Depression   . Endometrial hyperplasia   . Fuchs' corneal dystrophy   . Gallbladder problem   . Heart murmur    states no known problems, has never seen a cardiologist  . Hypertension    states under control with med., has been on med. x 5 yr.  . Immature cataract   . Lower extremity edema   . 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 Nipple discharge 04/2014   left breast  . Non-insulin dependent type 2 diabetes mellitus ([place])     PAST SURGICAL HISTORY: Past Surgical History:  Procedure Laterality Date  . BREAST BIOPSY Left 04/30/2014   Procedure: LEFT BREAST BIOPSY;  Surgeon: Jackolyn Confer, MD;  Location: [facility];  Service: General;  Laterality: Left;  . BREAST DUCTAL SYSTEM EXCISION Left 04/30/2014   Procedure: LEFT NIPPLE DUCT EXCISION;  Surgeon: Jackolyn Confer, MD;  Location: [facility];  Service: General;  Laterality: Left;  . [place]  . CHOLECYSTECTOMY  1989  . ROBOTIC ASSISTED TOTAL HYSTERECTOMY WITH BILATERAL SALPINGO OOPHERECTOMY Bilateral 10/13/2014   Procedure: ROBOTIC ASSISTED TOTAL HYSTERECTOMY WITH BILATERAL SALPINGO OOPHORECTOMY ;  Surgeon: Everitt Amber, MD;  Location: [facility];  Service: Gynecology;  Laterality: Bilateral;    SOCIAL HISTORY: Social History   Tobacco Use  . Smoking status: Never Smoker    . Smokeless tobacco: Never Used  Substance Use Topics  . Alcohol use: Yes    Comment: rarely  . Drug use: No    FAMILY HISTORY: Family History  Problem Relation Age of Onset  . Diabetes Mother   . Obesity Mother   . Heart disease Father   . Cancer Father        throat cancer   . Sudden death Father   . Alcoholism Father   . Diabetes Sister   . Diabetes Brother     ROS: Review of Systems  Constitutional: Positive for malaise/Monica. Negative for weight loss.  Eyes:       Wears glasses or contacts. Positive for vision changes. Positive for blurry or double vision. Positive for floaters.  Respiratory: Positive for cough and shortness of breath.   Skin:       Positive for hair or  nail changes. Positive for dryness.    PHYSICAL EXAM: Blood pressure 123/81, pulse 80, height 5\' 3"  (1.6 m), weight 216 lb (98 kg), SpO2 97 %. Body mass index is 38.26 kg/m. Physical Exam Vitals signs reviewed.  Constitutional:      Appearance: Normal appearance. She is obese.  HENT:     Head: Normocephalic and atraumatic.     Nose: Nose normal.  Eyes:     General: No scleral icterus.    Extraocular Movements: Extraocular movements intact.  Neck:     Musculoskeletal: Normal range of motion and neck supple.     Thyroid: No thyromegaly.     Comments: Negative for thyromegaly. Cardiovascular:     Rate and Rhythm: Normal rate and regular rhythm.  Pulmonary:     Effort: Pulmonary effort is normal. No respiratory distress.  Abdominal:     Palpations: Abdomen is soft.     Tenderness: There is no abdominal tenderness.     Comments: Positive for obesity.  Musculoskeletal:     Comments: ROM normal in all extremities.  Skin:    General: Skin is warm and dry.  Neurological:     Mental Status: She is alert and oriented to person, place, and time.     Coordination: Coordination normal.  Psychiatric:        Mood and Affect: Mood normal.        Behavior: Behavior normal.     RECENT  LABS AND TESTS: BMET    Component Value Date/Time   NA 139 03/22/2017 1220   NA 141 03/03/2016 1226   K 4.0 03/22/2017 1220   K 3.5 03/03/2016 1226   CL 103 03/22/2017 1220   CO2 26 03/22/2017 1220   CO2 24 03/03/2016 1226   GLUCOSE 242 (H) 03/22/2017 1220   GLUCOSE 113 03/03/2016 1226   BUN 11 03/22/2017 1220   BUN 8.5 03/03/2016 1226   CREATININE 0.95 03/22/2017 1220   CREATININE 0.8 03/03/2016 1226   CALCIUM 10.2 03/22/2017 1220   CALCIUM 10.0 03/03/2016 1226   GFRNONAA >60 03/22/2017 1220   GFRAA >60 03/22/2017 1220   Lab Results  Component Value Date   HGBA1C 8.2 06/20/2017   No results found for: INSULIN CBC    Component Value Date/Time   WBC 11.2 (H) 03/22/2017 1220   RBC 4.71 03/22/2017 1220   HGB 14.5 03/22/2017 1220   HGB 13.8 03/03/2016 1226   HCT 43.5 03/22/2017 1220   HCT 41.1 03/03/2016 1226   PLT 420 (H) 03/22/2017 1220   PLT 346 03/03/2016 1226   MCV 92.5 03/22/2017 1220   MCV 91.7 03/03/2016 1226   MCH 30.7 03/22/2017 1220   MCHC 33.2 03/22/2017 1220   RDW 13.9 03/22/2017 1220   RDW 14.1 03/03/2016 1226   LYMPHSABS 3.0 03/22/2017 1220   LYMPHSABS 2.8 03/03/2016 1226   MONOABS 0.4 03/22/2017 1220   MONOABS 0.3 03/03/2016 1226   EOSABS 0.1 03/22/2017 1220   EOSABS 0.2 03/03/2016 1226   BASOSABS 0.1 03/22/2017 1220   BASOSABS 0.1 03/03/2016 1226   Iron/TIBC/Ferritin/ %Sat No results found for: IRON, TIBC, FERRITIN, IRONPCTSAT Lipid Panel     Component Value Date/Time   CHOL 211 (H) 10/26/2016 0820   TRIG (H) 10/26/2016 0820    467.0 Triglyceride is over 400; calculations on Lipids are invalid.   HDL 32.70 (L) 10/26/2016 0820   CHOLHDL 6 10/26/2016 0820   VLDL 49 (H) 10/24/2008 0940   LDLCALC (H) 10/24/2008 0940    157  Total Cholesterol/HDL:CHD Risk Coronary Heart Disease Risk Table                     Men   Women  1/2 Average Risk   3.4   3.3  Average Risk       5.0   4.4  2 X Average Risk   9.6   7.1  3 X Average Risk  23.4    11.0        Use the calculated Monica Clark Ratio above and the CHD Risk Table to determine the Monica Clark's CHD Risk.        ATP III CLASSIFICATION (LDL):  <100     mg/dL   Optimal  100-129  mg/dL   Near or Above                    Optimal  130-159  mg/dL   Borderline  160-189  mg/dL   High  >190     mg/dL   Very High   LDLDIRECT 95.0 10/26/2016 0820   Hepatic Function Panel     Component Value Date/Time   PROT 8.7 (H) 03/22/2017 1220   PROT 8.2 03/03/2016 1226   ALBUMIN 3.8 03/22/2017 1220   ALBUMIN 3.7 03/03/2016 1226   AST 24 03/22/2017 1220   AST 72 (H) 03/03/2016 1226   ALT 17 03/22/2017 1220   ALT 46 03/03/2016 1226   ALKPHOS 114 03/22/2017 1220   ALKPHOS 118 03/03/2016 1226   BILITOT 0.5 03/22/2017 1220   BILITOT 0.61 03/03/2016 1226      Component Value Date/Time     10/24/2008 0457    ECG  shows NSR with a rate of 78 BPM. INDIRECT CALORIMETER done today shows a VO2 of 264 and a REE of 1832.  Her calculated basal metabolic rate is 7106 thus her basal metabolic rate is better than expected.  OBESITY BEHAVIORAL INTERVENTION VISIT  Today's visit was # 1   Starting weight: 216 lbs Starting date: 04/16/2018 Today's weight : Weight: 216 lb (98 kg)  Today's date: 04/16/2018 Total lbs lost to date: 0   04/16/2018  Height 5\' 3"  (1.6 m)  Weight 216 lb (98 kg)  BMI (Calculated) 38.27  BLOOD PRESSURE - SYSTOLIC 269  BLOOD PRESSURE - DIASTOLIC 81  Waist Measurement  47 inches   Body Fat % 46 %  Total Body Water (lbs) 77.4 lbs  RMR 1832   ASK: We discussed the diagnosis of obesity with Monica Clark today and Delfina agreed to give [place] permission to discuss obesity behavioral modification therapy today.  ASSESS: Caily has the diagnosis of obesity and her BMI today is 38.27. Demarie is in the action stage of change.   ADVISE: Andilynn was educated on the multiple health risks of obesity as well as the benefit of weight loss to improve her health. She was advised of the  need for long term treatment and the importance of lifestyle modifications to improve her current health and to decrease her risk of future health problems.  AGREE: Multiple dietary modification options and treatment options were discussed and Charmion agreed to follow the recommendations documented in the above note.  ARRANGE: Emelina was educated on the importance of frequent visits to treat obesity as outlined per CMS and USPSTF guidelines and agreed to schedule her next follow up appointment today.  Lenward Chancellor, CMA, am acting as transcriptionist for Starlyn Skeans, MD  I have reviewed the above documentation for  accuracy and completeness, and I agree with the above. -Dennard Nip, MD

## 2018-04-18 ENCOUNTER — Ambulatory Visit (INDEPENDENT_AMBULATORY_CARE_PROVIDER_SITE_OTHER): Payer: BC Managed Care – PPO | Admitting: Family Medicine

## 2018-04-18 LAB — CBC WITH DIFFERENTIAL
BASOS: 1 %
Basophils Absolute: 0.1 10*3/uL (ref 0.0–0.2)
EOS (ABSOLUTE): 0.2 10*3/uL (ref 0.0–0.4)
EOS: 1 %
HEMATOCRIT: 45 % (ref 34.0–46.6)
Hemoglobin: 15 g/dL (ref 11.1–15.9)
IMMATURE GRANS (ABS): 0 10*3/uL (ref 0.0–0.1)
IMMATURE GRANULOCYTES: 0 %
LYMPHS: 28 %
Lymphocytes Absolute: 3.4 10*3/uL — ABNORMAL HIGH (ref 0.7–3.1)
MCH: 30.3 pg (ref 26.6–33.0)
MCHC: 33.3 g/dL (ref 31.5–35.7)
MCV: 91 fL (ref 79–97)
Monocytes Absolute: 0.4 10*3/uL (ref 0.1–0.9)
Monocytes: 3 %
Neutrophils Absolute: 8.1 10*3/uL — ABNORMAL HIGH (ref 1.4–7.0)
Neutrophils: 67 %
RBC: 4.95 x10E6/uL (ref 3.77–5.28)
RDW: 14.1 % (ref 11.7–15.4)
WBC: 12.1 10*3/uL — ABNORMAL HIGH (ref 3.4–10.8)

## 2018-04-18 LAB — COMPREHENSIVE METABOLIC PANEL
A/G RATIO: 1.3 (ref 1.2–2.2)
ALBUMIN: 4.4 g/dL (ref 3.8–4.8)
ALT: 12 IU/L (ref 0–32)
AST: 27 IU/L (ref 0–40)
Alkaline Phosphatase: 99 IU/L (ref 39–117)
BUN/Creatinine Ratio: 13 (ref 12–28)
BUN: 12 mg/dL (ref 8–27)
Bilirubin Total: 0.4 mg/dL (ref 0.0–1.2)
CALCIUM: 10 mg/dL (ref 8.7–10.3)
CO2: 21 mmol/L (ref 20–29)
Chloride: 104 mmol/L (ref 96–106)
Creatinine, Ser: 0.89 mg/dL (ref 0.57–1.00)
GFR, EST AFRICAN AMERICAN: 81 mL/min/{1.73_m2} (ref >59)
GFR, EST NON AFRICAN AMERICAN: 70 mL/min/{1.73_m2} (ref >59)
GLOBULIN, TOTAL: 3.4 g/dL (ref 1.5–4.5)
Glucose: 183 mg/dL — ABNORMAL HIGH (ref 65–99)
POTASSIUM: 3.7 mmol/L (ref 3.5–5.2)
SODIUM: 143 mmol/L (ref 134–144)
TOTAL PROTEIN: 7.8 g/dL (ref 6.0–8.5)

## 2018-04-18 LAB — LIPID PANEL WITH LDL/HDL RATIO
Cholesterol, Total: 216 mg/dL — ABNORMAL HIGH (ref 100–199)
HDL: 43 mg/dL (ref >39)
LDL CALC: 126 mg/dL — AB (ref 0–99)
LDL/HDL RATIO: 2.9 ratio (ref 0.0–3.2)
TRIGLYCERIDES: 237 mg/dL — AB (ref 0–149)
VLDL Cholesterol Cal: 47 mg/dL — ABNORMAL HIGH (ref 5–40)

## 2018-04-18 LAB — INSULIN, RANDOM: INSULIN: 8.7 u[IU]/mL (ref 2.6–24.9)

## 2018-04-18 LAB — FOLATE: Folate: 4.9 ng/mL (ref >3.0)

## 2018-04-18 LAB — HEMOGLOBIN A1C
Est. average glucose Bld gHb Est-mCnc: 212 mg/dL
Hgb A1c MFr Bld: 9 % — ABNORMAL HIGH (ref 4.8–5.6)

## 2018-04-18 LAB — VITAMIN B12: VITAMIN B 12: 503 pg/mL (ref 232–1245)

## 2018-04-18 LAB — T4, FREE: Free T4: 1.05 ng/dL (ref 0.82–1.77)

## 2018-04-18 LAB — MICROALBUMIN / CREATININE URINE RATIO
Creatinine, Urine: 68.3 mg/dL
Microalbumin, Urine: <3 ug/mL

## 2018-04-18 LAB — TSH: TSH: 2.22 u[IU]/mL (ref 0.450–4.500)

## 2018-04-18 LAB — VITAMIN D 25 HYDROXY (VIT D DEFICIENCY, FRACTURES): VIT D 25 HYDROXY: 8.3 ng/mL — AB (ref 30.0–100.0)

## 2018-04-18 LAB — T3: T3, Total: 134 ng/dL (ref 71–180)

## 2018-04-22 ENCOUNTER — Encounter: Payer: Self-pay | Admitting: Hematology

## 2018-04-23 ENCOUNTER — Telehealth: Payer: Self-pay | Admitting: Hematology

## 2018-04-23 ENCOUNTER — Telehealth: Payer: Self-pay | Admitting: *Deleted

## 2018-04-23 NOTE — Telephone Encounter (Signed)
Received faxed results of Mammogram done 04/22/2018 @ [facility].   Noted pt was NO SHOW for office visit on 03/22/2018.  Schedule message sent .

## 2018-04-23 NOTE — Telephone Encounter (Signed)
Left message - unable to reach patient per 3/3 sch message - left message for patient to call back to set up an appt.

## 2018-04-30 ENCOUNTER — Ambulatory Visit (INDEPENDENT_AMBULATORY_CARE_PROVIDER_SITE_OTHER): Payer: BC Managed Care – PPO | Admitting: Family Medicine

## 2018-04-30 ENCOUNTER — Encounter (INDEPENDENT_AMBULATORY_CARE_PROVIDER_SITE_OTHER): Payer: Self-pay | Admitting: Family Medicine

## 2018-04-30 VITALS — BP 127/80 | HR 82 | Ht 63.0 in | Wt 214.0 lb

## 2018-04-30 DIAGNOSIS — E782 Mixed hyperlipidemia: Secondary | ICD-10-CM | POA: Diagnosis not present

## 2018-04-30 DIAGNOSIS — Z794 Long term (current) use of insulin: Secondary | ICD-10-CM

## 2018-04-30 DIAGNOSIS — I10 Essential (primary) hypertension: Secondary | ICD-10-CM | POA: Diagnosis not present

## 2018-04-30 DIAGNOSIS — Z9189 Other specified personal risk factors, not elsewhere classified: Secondary | ICD-10-CM | POA: Diagnosis not present

## 2018-04-30 DIAGNOSIS — E1165 Type 2 diabetes mellitus with hyperglycemia: Secondary | ICD-10-CM

## 2018-04-30 DIAGNOSIS — Z6837 Body mass index (BMI) 37.0-37.9, adult: Secondary | ICD-10-CM

## 2018-04-30 DIAGNOSIS — E559 Vitamin D deficiency, unspecified: Secondary | ICD-10-CM

## 2018-04-30 MED ORDER — VITAMIN D (ERGOCALCIFEROL) 1.25 MG (50000 UNIT) PO CAPS: 50000.0000 [IU] | ORAL_CAPSULE | ORAL | 0 refills | Status: DC

## 2018-05-01 NOTE — Progress Notes (Signed)
Office: [phone]  /  Fax: [phone]   HPI:   Chief Complaint: OBESITY Monica Clark is here to discuss her progress with her obesity treatment plan. She is on the Category 2 plan + 80 to 100 calorie greek yogurt and is following her eating plan approximately 50 % of the time. She states she is exercising 0 minutes 0 times per week. Monica Clark did well with weight loss, but struggled to follow her plan closely. She still ate out a lot, but she tried to make some better choices.  Her weight is 214 lb (97.1 kg) today and has had a weight loss of 2 pounds over a period of 2 weeks since her last visit. She has lost 2 lbs since starting treatment with [place].  Diabetes II Uncontrolled on Insulin with Hyperglycemia Monica Clark has a diagnosis of diabetes type II. Monica Clark states that her fasting blood sugars are in the 170's and she didn't check many post prandial blood sugars. Her last A1c was 9.0 on 04/16/18. She is on metformin, glimepiride, Jardiance, and [place]. She has been working on intensive lifestyle modifications including diet, exercise, and weight loss to help control her blood glucose levels. She denies any hypoglycemic episodes.  Hyperlipidemia (Mixed) Monica Clark has hyperlipidemia with elevated LDL at 126, triglycerides at 237, and a decreased HDL of 43 on 04/16/18. She had been on a statin previously, but stopped on her own as it was not for side effects. Monica Clark has been trying to improve her cholesterol levels with intensive lifestyle modification including a low saturated fat diet, exercise and weight loss.   Hypertension Monica Clark is a 63 y.o. female with hypertension. Monica Clark's blood pressure is currently controlled today on Maxzide. She is working on weight loss to help control her blood pressure with the goal of decreasing her risk of heart attack and stroke. Monica Clark denies chest pain or lightheadedness.   At risk for cardiovascular disease Monica Clark is at a higher than average risk for cardiovascular  disease due to hyperlipidemia, hypertension, and obesity.   Vitamin D Deficiency Monica Clark has a diagnosis of vitamin D deficiency. She is not currently on vit D. Monica Clark admits fatigue and denies nausea, vomiting, or muscle weakness.  ASSESSMENT AND PLAN:  Type 2 diabetes mellitus with hyperglycemia, with long-term current use of insulin (HCC)  Mixed hyperlipidemia  Essential hypertension  Vitamin D deficiency - Plan: Vitamin D, Ergocalciferol, (DRISDOL) 1.25 MG (50000 UT) CAPS capsule  At risk for heart disease  Class 2 severe obesity with serious comorbidity and body mass index (BMI) of 37.0 to 37.9 in adult, unspecified obesity type ([place])  PLAN:  Diabetes II, Uncontrolled on Insulin with Hyperglycemia Monica Clark has been given extensive diabetes education by myself today including ideal fasting and post-prandial blood glucose readings, individual ideal Hgb A1c goals, and hypoglycemia prevention. We discussed the importance of good blood sugar control to decrease the likelihood of diabetic complications such as nephropathy, neuropathy, limb loss, blindness, coronary artery disease, and death. We discussed the importance of intensive lifestyle modification including diet, exercise and weight loss as the first line treatment for diabetes. Monica Clark agrees to continue her diet prescription and diabetes medications. She agrees to bring in her blood sugar log to her next visit and she will follow up at the agreed upon time in 2 to 3 weeks.  Hyperlipidemia (Mixed) Monica Clark was informed of the American Heart Association Guidelines emphasizing intensive lifestyle modifications as the first line treatment for hyperlipidemia. We discussed many lifestyle modifications today in  depth, and Monica Clark will continue to work on decreasing saturated fats such as fatty red meat, butter and many fried foods. She will also increase vegetables and lean protein in her diet and continue to work on exercise and weight loss efforts.  Monica Clark agrees to continue diet and exercise. We will readdress starting a statin after her next labs are drawn if she is not at goal with lifestyle changes. Monica Clark agrees to follow up as directed.  Hypertension We discussed sodium restriction, working on healthy weight loss, and a regular exercise program as the means to achieve improved blood pressure control. We will continue to monitor her blood pressure as well as her progress with the above lifestyle modifications. She will continue her diet, exercise, and medications as prescribed. She will watch for signs of hypotension as she continues her lifestyle modifications. Monica Clark agreed with this plan and agreed to follow up as directed.  Cardiovascular risk counseling Monica Clark was given extended (30 minutes) coronary artery disease prevention counseling today. She is 63 y.o. female and has risk factors for heart disease including hyperlipidemia, hypertension, and obesity. We discussed intensive lifestyle modifications today with an emphasis on specific weight loss instructions and strategies. Pt was also informed of the importance of increasing exercise and decreasing saturated fats to help prevent heart disease.  Vitamin D Deficiency Monica Clark was informed that low vitamin D levels contributes to fatigue and are associated with obesity, breast, and colon cancer. Monica Clark agrees to continue to take prescription Vit D @50 ,000 IU every week #4 with no refills and will follow up for routine testing of vitamin D, at least 2-3 times per year. She was informed of the risk of over-replacement of vitamin D and agrees to not increase her dose unless she discusses this with [place] first. Monica Clark agrees to follow up in 2 to 3 weeks as directed.  Obesity Monica Clark is currently in the action stage of change. As such, her goal is to continue with weight loss efforts. She has agreed to follow the Category 2 plan. Monica Clark has been instructed to work up to a goal of 150 minutes of combined  cardio and strengthening exercise per week for weight loss and overall health benefits. We discussed the following Behavioral Modification Strategies today: increasing lean protein intake, decreasing simple carbohydrates, and work on meal planning and easy cooking plans.  Monica Clark has agreed to follow up with our clinic in 2 to 3 weeks. She was informed of the importance of frequent follow up visits to maximize her success with intensive lifestyle modifications for her multiple health conditions.  ALLERGIES: Allergies  Allergen Reactions  . Codeine Other (See Comments)    INSOMNIA    MEDICATIONS: Current Outpatient Medications on File Prior to Visit  Medication Sig Dispense Refill  . glimepiride (AMARYL) 4 MG tablet Take 4 mg by mouth daily with breakfast.    . glucose blood (ONE TOUCH TEST STRIPS) test strip Use as instructed one time a day 100 each 12  . Insulin Pen Needle 31G X 5 MM MISC Use once a day 100 each 1  . JARDIANCE 25 MG TABS tablet TAKE 1 TABLET BY MOUTH EVERY DAY 30 tablet 2  . metFORMIN (GLUCOPHAGE-XR) 500 MG 24 hr tablet Take 4 tablets (2,000 mg total) by mouth daily with supper. 120 tablet 3  . SOLIQUA 100-33 UNT-MCG/ML SOPN INJECT 30 UNITS INTO THE SKIN DAILY WITH BREAKFAST. 3 pen 3  . tamoxifen (NOLVADEX) 20 MG tablet TAKE 1 TABLET BY MOUTH EVERY DAY  90 tablet 0  . triamterene-hydrochlorothiazide (MAXZIDE-25) 37.5-25 MG per tablet Take 1 tablet by mouth every morning. Reported on 09/03/2015     No current facility-administered medications on file prior to visit.     PAST MEDICAL HISTORY: Past Medical History:  Diagnosis Date  . Breast mass, left    duct mass  . Depression   . Endometrial hyperplasia   . Fuchs' corneal dystrophy   . Gallbladder problem   . Heart murmur    states no known problems, has never seen a cardiologist  . Hypertension    states under control with med., has been on med. x 5 yr.  . Immature cataract   . Lower extremity edema   .  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 Nipple discharge 04/2014   left breast  . Non-insulin dependent type 2 diabetes mellitus ([place])     PAST SURGICAL HISTORY: Past Surgical History:  Procedure Laterality Date  . BREAST BIOPSY Left 04/30/2014   Procedure: LEFT BREAST BIOPSY;  Surgeon: Jackolyn Confer, MD;  Location: [facility];  Service: General;  Laterality: Left;  . BREAST DUCTAL SYSTEM EXCISION Left 04/30/2014   Procedure: LEFT NIPPLE DUCT EXCISION;  Surgeon: Jackolyn Confer, MD;  Location: [facility];  Service: General;  Laterality: Left;  . [place]  . CHOLECYSTECTOMY  1989  . ROBOTIC ASSISTED TOTAL HYSTERECTOMY WITH BILATERAL SALPINGO OOPHERECTOMY Bilateral 10/13/2014   Procedure: ROBOTIC ASSISTED TOTAL HYSTERECTOMY WITH BILATERAL SALPINGO OOPHORECTOMY ;  Surgeon: Everitt Amber, MD;  Location: [facility];  Service: Gynecology;  Laterality: Bilateral;    SOCIAL HISTORY: Social History   Tobacco Use  . Smoking status: Never Smoker  . Smokeless tobacco: Never Used  Substance Use Topics  . Alcohol use: Yes    Comment: rarely  . Drug use: No    FAMILY HISTORY: Family History  Problem Relation Age of Onset  . Diabetes Mother   . Obesity Mother   . Heart disease Father   . Cancer Father        throat cancer   . Sudden death Father   . Alcoholism Father   . Diabetes Sister   . Diabetes Brother    ROS: Review of Systems  Constitutional: Positive for malaise/fatigue and weight loss.  Cardiovascular: Negative for chest pain.  Gastrointestinal: Negative for nausea and vomiting.  Musculoskeletal:       Negative for muscle weakness.  Neurological:       Negative for lightheadedness.  Endo/Heme/Allergies:       Negative for hypoglycemia. Positive for hyperglycemia.   PHYSICAL EXAM: Blood pressure 127/80, pulse 82, height 5\' 3"  (1.6 m), weight 214 lb (97.1 kg), SpO2 98 %. Body mass index is 37.91 kg/m. Physical Exam Vitals  signs reviewed.  Constitutional:      Appearance: Normal appearance. She is obese.  Cardiovascular:     Rate and Rhythm: Normal rate.  Pulmonary:     Effort: Pulmonary effort is normal.  Musculoskeletal: Normal range of motion.  Skin:    General: Skin is warm and dry.  Neurological:     Mental Status: She is alert and oriented to person, place, and time.  Psychiatric:        Mood and Affect: Mood normal.        Behavior: Behavior normal.    RECENT LABS AND TESTS: BMET    Component Value Date/Time   NA 143 04/16/2018 1229   NA 141 03/03/2016 1226  K 3.7 04/16/2018 1229   K 3.5 03/03/2016 1226   CL 104 04/16/2018 1229   CO2 21 04/16/2018 1229   CO2 24 03/03/2016 1226   GLUCOSE 183 (H) 04/16/2018 1229   GLUCOSE 242 (H) 03/22/2017 1220   GLUCOSE 113 03/03/2016 1226   BUN 12 04/16/2018 1229   BUN 8.5 03/03/2016 1226   CREATININE 0.89 04/16/2018 1229   CREATININE 0.8 03/03/2016 1226   CALCIUM 10.0 04/16/2018 1229   CALCIUM 10.0 03/03/2016 1226   GFRNONAA 70 04/16/2018 1229   GFRAA 81 04/16/2018 1229   Lab Results  Component Value Date   HGBA1C 9.0 (H) 04/16/2018   HGBA1C 8.2 06/20/2017   HGBA1C 15.5 02/09/2017   HGBA1C 11.1 (H) 10/26/2016   HGBA1C (H) 10/24/2008    7.2 (NOTE) The ADA recommends the following therapeutic goal for glycemic control related to Hgb A1c measurement: Goal of therapy: <6.5 Hgb A1c  Reference: American Diabetes Association: Clinical Practice Recommendations 2010, Diabetes Care, 2010, 33: (Suppl  1).   Lab Results  Component Value Date   INSULIN 8.7 04/16/2018   CBC    Component Value Date/Time   WBC 12.1 (H) 04/16/2018 1229   WBC 11.2 (H) 03/22/2017 1220   RBC 4.95 04/16/2018 1229   RBC 4.71 03/22/2017 1220   HGB 15.0 04/16/2018 1229   HGB 13.8 03/03/2016 1226   HCT 45.0 04/16/2018 1229   HCT 41.1 03/03/2016 1226   PLT 420 (H) 03/22/2017 1220   PLT 346 03/03/2016 1226   MCV 91 04/16/2018 1229   MCV 91.7 03/03/2016 1226   MCH  30.3 04/16/2018 1229   MCH 30.7 03/22/2017 1220   MCHC 33.3 04/16/2018 1229   MCHC 33.2 03/22/2017 1220   RDW 14.1 04/16/2018 1229   RDW 14.1 03/03/2016 1226   LYMPHSABS 3.4 (H) 04/16/2018 1229   LYMPHSABS 2.8 03/03/2016 1226   MONOABS 0.4 03/22/2017 1220   MONOABS 0.3 03/03/2016 1226   EOSABS 0.2 04/16/2018 1229   BASOSABS 0.1 04/16/2018 1229   BASOSABS 0.1 03/03/2016 1226   Iron/TIBC/Ferritin/ %Sat No results found for: IRON, TIBC, FERRITIN, IRONPCTSAT Lipid Panel     Component Value Date/Time   CHOL 216 (H) 04/16/2018 1229   TRIG 237 (H) 04/16/2018 1229   HDL 43 04/16/2018 1229   CHOLHDL 6 10/26/2016 0820   VLDL 49 (H) 10/24/2008 0940   LDLCALC 126 (H) 04/16/2018 1229   LDLDIRECT 95.0 10/26/2016 0820   Hepatic Function Panel     Component Value Date/Time   PROT 7.8 04/16/2018 1229   PROT 8.2 03/03/2016 1226   ALBUMIN 4.4 04/16/2018 1229   ALBUMIN 3.7 03/03/2016 1226   AST 27 04/16/2018 1229   AST 72 (H) 03/03/2016 1226   ALT 12 04/16/2018 1229   ALT 46 03/03/2016 1226   ALKPHOS 99 04/16/2018 1229   ALKPHOS 118 03/03/2016 1226   BILITOT 0.4 04/16/2018 1229   BILITOT 0.61 03/03/2016 1226      Component Value Date/Time   TSH 2.220 04/16/2018 1229     10/24/2008 0457   Results for MARYELLEN, DOWDLE (MRN [id number]) as of 05/01/2018 07:24  Ref. Range 04/16/2018 12:29  Vitamin D, 25-Hydroxy Latest Ref Range: 30.0 - 100.0 ng/mL 8.3 (L)   OBESITY BEHAVIORAL INTERVENTION VISIT  Today's visit was # 2   Starting weight: 216 lbs Starting date: 04/16/18 Today's weight : Weight: 214 lb (97.1 kg)  Today's date: 04/30/2018 Total lbs lost to date: 2    04/30/2018  Height 5\' 3"  (  1.6 m)  Weight 214 lb (97.1 kg)  BMI (Calculated) 37.92  BLOOD PRESSURE - SYSTOLIC 575  BLOOD PRESSURE - DIASTOLIC 80   Body Fat % 05.1 %  Total Body Water (lbs) 77.4 lbs   ASK: We discussed the diagnosis of obesity with Hal Neer today and Alliana agreed to give [place] permission to discuss  obesity behavioral modification therapy today.  ASSESS: Shala has the diagnosis of obesity and her BMI today is 37.92. Ylianna is in the action stage of change.   ADVISE: Marilee was educated on the multiple health risks of obesity as well as the benefit of weight loss to improve her health. She was advised of the need for long term treatment and the importance of lifestyle modifications to improve her current health and to decrease her risk of future health problems.  AGREE: Multiple dietary modification options and treatment options were discussed and Josephyne agreed to follow the recommendations documented in the above note.  ARRANGE: Zarya was educated on the importance of frequent visits to treat obesity as outlined per CMS and USPSTF guidelines and agreed to schedule her next follow up appointment today.  IMarcille Blanco, CMA, am acting as transcriptionist for Starlyn Skeans, MD  I have reviewed the above documentation for accuracy and completeness, and I agree with the above. -Dennard Nip, MD

## 2018-05-06 ENCOUNTER — Inpatient Hospital Stay: Payer: Self-pay | Attending: Hematology

## 2018-05-06 ENCOUNTER — Inpatient Hospital Stay: Payer: Self-pay | Admitting: Hematology

## 2018-05-14 ENCOUNTER — Telehealth: Payer: Self-pay | Admitting: Hematology

## 2018-05-14 NOTE — Telephone Encounter (Signed)
Called patient per 3/23 VM log.  Rescheduled patient lab and MD visit from 3/16.  Patient aware of appt date and time.

## 2018-05-15 ENCOUNTER — Encounter (INDEPENDENT_AMBULATORY_CARE_PROVIDER_SITE_OTHER): Payer: Self-pay

## 2018-05-18 ENCOUNTER — Other Ambulatory Visit (INDEPENDENT_AMBULATORY_CARE_PROVIDER_SITE_OTHER): Payer: Self-pay | Admitting: Family Medicine

## 2018-05-18 DIAGNOSIS — E559 Vitamin D deficiency, unspecified: Secondary | ICD-10-CM

## 2018-05-21 ENCOUNTER — Other Ambulatory Visit: Payer: Self-pay

## 2018-05-21 ENCOUNTER — Ambulatory Visit (INDEPENDENT_AMBULATORY_CARE_PROVIDER_SITE_OTHER): Payer: BC Managed Care – PPO | Admitting: Family Medicine

## 2018-05-21 ENCOUNTER — Encounter (INDEPENDENT_AMBULATORY_CARE_PROVIDER_SITE_OTHER): Payer: Self-pay | Admitting: Family Medicine

## 2018-05-21 DIAGNOSIS — E119 Type 2 diabetes mellitus without complications: Secondary | ICD-10-CM

## 2018-05-21 DIAGNOSIS — E559 Vitamin D deficiency, unspecified: Secondary | ICD-10-CM | POA: Diagnosis not present

## 2018-05-21 DIAGNOSIS — F3289 Other specified depressive episodes: Secondary | ICD-10-CM | POA: Diagnosis not present

## 2018-05-21 DIAGNOSIS — Z6837 Body mass index (BMI) 37.0-37.9, adult: Secondary | ICD-10-CM

## 2018-05-21 MED ORDER — VITAMIN D (ERGOCALCIFEROL) 1.25 MG (50000 UNIT) PO CAPS: 50000.0000 [IU] | ORAL_CAPSULE | ORAL | 0 refills | Status: DC

## 2018-05-21 MED ORDER — BUPROPION HCL ER (SR) 150 MG PO TB12: 150.0000 mg | ORAL_TABLET | Freq: Every day | ORAL | 0 refills | Status: DC

## 2018-05-21 NOTE — Progress Notes (Signed)
Office: [phone]  /  Fax: [phone] TeleHealth Visit:  Monica Clark has consented to this TeleHealth visit today via FaceTime. The patient is located at home, the provider is located at the [employer] [facility] office. The participants in this visit include the listed provider and patient and any and all parties involved.   HPI:   Chief Complaint: OBESITY Monica Clark is here to discuss her progress with her obesity treatment plan. She is on the Category 2 plan and is following her eating plan approximately 75 to 80 % of the time. She states she is exercising 0 minutes 0 times per week. [name] thinks she has done okay on her eating plan while in [facility]-19 isolation. She notes increased snacking, but she still thinks she has lost 1 to 2 pounds in the last two weeks. We were unable to weigh the patient today for this TeleHealth visit.She feels as if she has lost weight since her last visit. She has lost 3 to 4 lbs since starting treatment with [place].  Vitamin D deficiency Monica Clark has a diagnosis of vitamin D deficiency. Her last vitamin D level was very low. She is stable on prescription vit D and she denies nausea, vomiting or muscle weakness. She is not yet at goal.  Diabetes II Monica Clark has a diagnosis of diabetes type II. [name] states fasting BGs range mostly between 145 and 172, which is improved from the 200 to 250's range that she was used to. She is working on her diet prescription and she denies any hypoglycemic episodes. Last A1c was at 9.0 She has been working on intensive lifestyle modifications including diet, exercise, and weight loss to help control her blood glucose levels.  Depression with emotional eating behaviors Monica Clark notes increased stress while working from home and during the COVID-19 isolation. She had increased stress eating and she notes increased irritability and feeling frustrated. She struggles with emotional eating and using food for comfort to the extent that it is  negatively impacting her health. She often snacks when she is not hungry. Monica Clark sometimes feels she is out of control and then feels guilty that she made poor food choices. She has been working on behavior modification techniques to help reduce her emotional eating and has been somewhat successful. She shows no sign of suicidal or homicidal ideations.  Depression screen Monica Clark 2/9 04/16/2018 07/10/2017 09/03/2015  Decreased Interest 2 0 0  Down, Depressed, Hopeless 1 0 0  PHQ - 2 Score 3 0 0  Altered sleeping 1 - -  Tired, decreased energy 3 - -  Change in appetite 3 - -  Feeling bad or failure about yourself  2 - -  Trouble concentrating 2 - -  Moving slowly or fidgety/restless 0 - -  Suicidal thoughts 0 - -  PHQ-9 Score 14 - -  Difficult doing work/chores Somewhat difficult - -      ASSESSMENT AND PLAN:  Vitamin D deficiency - Plan: Vitamin D, Ergocalciferol, (DRISDOL) 1.25 MG (50000 UT) CAPS capsule  Type 2 diabetes mellitus without complication, without long-term current use of insulin (HCC)  Other depression - with emotional eating - Plan: buPROPion (WELLBUTRIN SR) 150 MG 12 hr tablet  Class 2 severe obesity with serious comorbidity and body mass index (BMI) of 37.0 to 37.9 in adult, unspecified obesity type ([place])  PLAN:  Vitamin D Deficiency Monica Clark was informed that low vitamin D levels contributes to fatigue and are associated with obesity, breast, and colon cancer. She agrees  to continue to take prescription Vit D @50 ,000 IU every week #4 with no refills and will follow up for routine testing of vitamin D, at least 2-3 times per year. She was informed of the risk of over-replacement of vitamin D and agrees to not increase her dose unless she discusses this with [place] first. [name] agrees to follow up with our clinic in 2 weeks.  Diabetes II Monica Clark has been given extensive diabetes education by myself today including ideal fasting and post-prandial blood glucose readings, individual  ideal Hgb A1c goals and hypoglycemia prevention. We discussed the importance of good blood sugar control to decrease the likelihood of diabetic complications such as nephropathy, neuropathy, limb loss, blindness, coronary artery disease, and death. We discussed the importance of intensive lifestyle modification including diet, exercise and weight loss as the first line treatment for diabetes. Monica Clark will continue her diet and diabetes medications and will continue to monitor. Monica Clark will follow up at the agreed upon time.  Depression with Emotional Eating Behaviors We discussed behavior modification techniques today to help Monica Clark deal with her emotional eating and depression. She has agreed to start Wellbutrin SR 150 mg qAM #30 with no refills and follow up with our clinic in 2 weeks.  Obesity Monica Clark is currently in the action stage of change. As such, her goal is to continue with weight loss efforts She has agreed to follow the Category 2 plan Monica Clark has been instructed to work up to a goal of 150 minutes of combined cardio and strengthening exercise per week for weight loss and overall health benefits. We discussed the following Behavioral Modification Strategies today: better snacking choices, increasing lean protein intake, work on meal planning and easy cooking plans, emotional eating strategies and ways to avoid night time snacking  Monica Clark has agreed to follow up with our clinic in 2 weeks. She was informed of the importance of frequent follow up visits to maximize her success with intensive lifestyle modifications for her multiple health conditions.  ALLERGIES: Allergies  Allergen Reactions  . Codeine Other (See Comments)    INSOMNIA    MEDICATIONS: Current Outpatient Medications on File Prior to Visit  Medication Sig Dispense Refill  . glimepiride (AMARYL) 4 MG tablet Take 4 mg by mouth daily with breakfast.    . glucose blood (ONE TOUCH TEST STRIPS) test strip Use as instructed one time  a day 100 each 12  . Insulin Pen Needle 31G X 5 MM MISC Use once a day 100 each 1  . JARDIANCE 25 MG TABS tablet TAKE 1 TABLET BY MOUTH EVERY DAY 30 tablet 2  . metFORMIN (GLUCOPHAGE-XR) 500 MG 24 hr tablet Take 4 tablets (2,000 mg total) by mouth daily with supper. 120 tablet 3  . SOLIQUA 100-33 UNT-MCG/ML SOPN INJECT 30 UNITS INTO THE SKIN DAILY WITH BREAKFAST. 3 pen 3  . tamoxifen (NOLVADEX) 20 MG tablet TAKE 1 TABLET BY MOUTH EVERY DAY 90 tablet 0  . triamterene-hydrochlorothiazide (MAXZIDE-25) 37.5-25 MG per tablet Take 1 tablet by mouth every morning. Reported on 09/03/2015     No current facility-administered medications on file prior to visit.     PAST MEDICAL HISTORY: Past Medical History:  Diagnosis Date  . Breast mass, left    duct mass  . Depression   . Endometrial hyperplasia   . Fuchs' corneal dystrophy   . Gallbladder problem   . Heart murmur    states no known problems, has never seen a cardiologist  . Hypertension  states under control with med., has been on med. x 5 yr.  . Immature cataract   . Lower extremity edema   . 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 Nipple discharge 04/2014   left breast  . Non-insulin dependent type 2 diabetes mellitus ([place])     PAST SURGICAL HISTORY: Past Surgical History:  Procedure Laterality Date  . BREAST BIOPSY Left 04/30/2014   Procedure: LEFT BREAST BIOPSY;  Surgeon: Jackolyn Confer, MD;  Location: [place];  Service: General;  Laterality: Left;  . BREAST DUCTAL SYSTEM EXCISION Left 04/30/2014   Procedure: LEFT NIPPLE DUCT EXCISION;  Surgeon: Jackolyn Confer, MD;  Location: [place];  Service: General;  Laterality: Left;  . [place]  . CHOLECYSTECTOMY  1989  . ROBOTIC ASSISTED TOTAL HYSTERECTOMY WITH BILATERAL SALPINGO OOPHERECTOMY Bilateral 10/13/2014   Procedure: ROBOTIC ASSISTED TOTAL HYSTERECTOMY WITH BILATERAL SALPINGO OOPHORECTOMY ;  Surgeon: Everitt Amber, MD;   Location: [facility];  Service: Gynecology;  Laterality: Bilateral;    SOCIAL HISTORY: Social History   Tobacco Use  . Smoking status: Never Smoker  . Smokeless tobacco: Never Used  Substance Use Topics  . Alcohol use: Yes    Comment: rarely  . Drug use: No    FAMILY HISTORY: Family History  Problem Relation Age of Onset  . Diabetes Mother   . Obesity Mother   . Heart disease Father   . Cancer Father        throat cancer   . Sudden death Father   . Alcoholism Father   . Diabetes Sister   . Diabetes Brother     ROS: Review of Systems  Constitutional: Positive for weight loss.  Gastrointestinal: Negative for nausea and vomiting.  Musculoskeletal:       Negative for muscle weakness  Endo/Heme/Allergies:       Negative for hypoglycemia  Psychiatric/Behavioral: Positive for depression. Negative for suicidal ideas.       Positive for stress Positive for irritability    PHYSICAL EXAM: Pt in no acute distress  RECENT LABS AND TESTS: BMET    Component Value Date/Time   NA 143 04/16/2018 1229   NA 141 03/03/2016 1226   K 3.7 04/16/2018 1229   K 3.5 03/03/2016 1226   CL 104 04/16/2018 1229   CO2 21 04/16/2018 1229   CO2 24 03/03/2016 1226   GLUCOSE 183 (H) 04/16/2018 1229   GLUCOSE 242 (H) 03/22/2017 1220   GLUCOSE 113 03/03/2016 1226   BUN 12 04/16/2018 1229   BUN 8.5 03/03/2016 1226   CREATININE 0.89 04/16/2018 1229   CREATININE 0.8 03/03/2016 1226   CALCIUM 10.0 04/16/2018 1229   CALCIUM 10.0 03/03/2016 1226   GFRNONAA 70 04/16/2018 1229   GFRAA 81 04/16/2018 1229   Lab Results  Component Value Date   HGBA1C 9.0 (H) 04/16/2018   HGBA1C 8.2 06/20/2017   HGBA1C 15.5 02/09/2017   HGBA1C 11.1 (H) 10/26/2016   HGBA1C (H) 10/24/2008    7.2 (NOTE) The ADA recommends the following therapeutic goal for glycemic control related to Hgb A1c measurement: Goal of therapy: <6.5 Hgb A1c  Reference: American Diabetes Association: Clinical Practice Recommendations 2010,  Diabetes Care, 2010, 33: (Suppl  1).   Lab Results  Component Value Date   INSULIN 8.7 04/16/2018   CBC    Component Value Date/Time   WBC 12.1 (H) 04/16/2018 1229   WBC 11.2 (H) 03/22/2017 1220   RBC 4.95 04/16/2018 1229   RBC  4.71 03/22/2017 1220   HGB 15.0 04/16/2018 1229   HGB 13.8 03/03/2016 1226   HCT 45.0 04/16/2018 1229   HCT 41.1 03/03/2016 1226   PLT 420 (H) 03/22/2017 1220   PLT 346 03/03/2016 1226   MCV 91 04/16/2018 1229   MCV 91.7 03/03/2016 1226   MCH 30.3 04/16/2018 1229   MCH 30.7 03/22/2017 1220   MCHC 33.3 04/16/2018 1229   MCHC 33.2 03/22/2017 1220   RDW 14.1 04/16/2018 1229   RDW 14.1 03/03/2016 1226   LYMPHSABS 3.4 (H) 04/16/2018 1229   LYMPHSABS 2.8 03/03/2016 1226   MONOABS 0.4 03/22/2017 1220   MONOABS 0.3 03/03/2016 1226   EOSABS 0.2 04/16/2018 1229   BASOSABS 0.1 04/16/2018 1229   BASOSABS 0.1 03/03/2016 1226   Iron/TIBC/Ferritin/ %Sat No results found for: IRON, TIBC, FERRITIN, IRONPCTSAT Lipid Panel     Component Value Date/Time   CHOL 216 (H) 04/16/2018 1229   TRIG 237 (H) 04/16/2018 1229   HDL 43 04/16/2018 1229   CHOLHDL 6 10/26/2016 0820   VLDL 49 (H) 10/24/2008 0940   LDLCALC 126 (H) 04/16/2018 1229   LDLDIRECT 95.0 10/26/2016 0820   Hepatic Function Panel     Component Value Date/Time   PROT 7.8 04/16/2018 1229   PROT 8.2 03/03/2016 1226   ALBUMIN 4.4 04/16/2018 1229   ALBUMIN 3.7 03/03/2016 1226   AST 27 04/16/2018 1229   AST 72 (H) 03/03/2016 1226   ALT 12 04/16/2018 1229   ALT 46 03/03/2016 1226   ALKPHOS 99 04/16/2018 1229   ALKPHOS 118 03/03/2016 1226   BILITOT 0.4 04/16/2018 1229   BILITOT 0.61 03/03/2016 1226    Results for ANICA, ALCARAZ (MRN [id number]) as of 05/21/2018 11:08  Ref. Range 04/16/2018 12:29  Vitamin D, 25-Hydroxy Latest Ref Range: 30.0 - 100.0 ng/mL 8.3 (L)     I, Doreene Nest, am acting as Location manager for Dennard Nip, MD I have reviewed the above documentation for accuracy and  completeness, and I agree with the above. -Dennard Nip, MD

## 2018-06-04 ENCOUNTER — Other Ambulatory Visit: Payer: Self-pay

## 2018-06-04 ENCOUNTER — Ambulatory Visit (INDEPENDENT_AMBULATORY_CARE_PROVIDER_SITE_OTHER): Payer: BC Managed Care – PPO | Admitting: Family Medicine

## 2018-06-04 ENCOUNTER — Encounter (INDEPENDENT_AMBULATORY_CARE_PROVIDER_SITE_OTHER): Payer: Self-pay | Admitting: Family Medicine

## 2018-06-04 DIAGNOSIS — G4709 Other insomnia: Secondary | ICD-10-CM

## 2018-06-04 DIAGNOSIS — E119 Type 2 diabetes mellitus without complications: Secondary | ICD-10-CM

## 2018-06-04 DIAGNOSIS — Z6837 Body mass index (BMI) 37.0-37.9, adult: Secondary | ICD-10-CM

## 2018-06-04 DIAGNOSIS — Z794 Long term (current) use of insulin: Secondary | ICD-10-CM

## 2018-06-04 DIAGNOSIS — F3289 Other specified depressive episodes: Secondary | ICD-10-CM | POA: Diagnosis not present

## 2018-06-04 MED ORDER — BUPROPION HCL ER (SR) 200 MG PO TB12: 200.0000 mg | ORAL_TABLET | Freq: Every day | ORAL | 0 refills | Status: DC

## 2018-06-04 NOTE — Progress Notes (Signed)
Office: [phone]  /  Fax: [phone] TeleHealth Visit:  Monica Clark has verbally consented to this TeleHealth visit today. The patient is located at home, the provider is located at the [employer] [facility] office. The participants in this visit include the listed provider and patient and any and all parties involved. The visit was conducted today via telephone. Monica Clark was unable to use realtime audiovisual technology today (FaceTime) and the telehealth visit was conducted via telephone.   HPI:   Chief Complaint: OBESITY Monica Clark is here to discuss her progress with her obesity treatment plan. She is on the Category 2 plan and is following her eating plan approximately 80 % of the time. She states she is exercising 0 minutes 0 times per week. Monica Clark states that she has done better with her eating plan and with weight loss. She is working on meal planning and avoiding boredom eating. Monica Clark is trying to be more active, but she is not doing any formal exercise yet, due to fatigue. We were unable to weigh the patient today for this TeleHealth visit. She feels as if she has lost weight since her last visit.   Insomnia Monica Clark is not sleeping well and she notes an increase in hot flashes on Tamoxifen. She has tried to reduce screen time, but she often wakes up and cannot fall back asleep.  Diabetes II Monica Clark has a diagnosis of diabetes type II. Monica Clark states fasting BGs mostly range between 140 and 150's, which is a decrease from the 180's.  Monica Clark denies any hypoglycemic episodes. She continues to do well on her diet prescription. Last A1c was at 9.0 She has been working on intensive lifestyle modifications including diet, exercise, and weight loss to help control her blood glucose levels.  Depression with emotional eating behaviors Monica Clark started Wellbutrin, but she notes no change in fatigue and no change in mood. She wonders if she could increase her dose. Monica Clark struggles with emotional  eating and using food for comfort to the extent that it is negatively impacting her health. She often snacks when she is not hungry. Monica Clark sometimes feels she is out of control and then feels guilty that she made poor food choices. She has been working on behavior modification techniques to help reduce her emotional eating and has been somewhat successful. She shows no sign of suicidal or homicidal ideations.  Depression screen [facility] 2/9 04/16/2018 07/10/2017 09/03/2015  Decreased Interest 2 0 0  Down, Depressed, Hopeless 1 0 0  PHQ - 2 Score 3 0 0  Altered sleeping 1 - -  Tired, decreased energy 3 - -  Change in appetite 3 - -  Feeling bad or failure about yourself  2 - -  Trouble concentrating 2 - -  Moving slowly or fidgety/restless 0 - -  Suicidal thoughts 0 - -  PHQ-9 Score 14 - -  Difficult doing work/chores Somewhat difficult - -    ASSESSMENT AND PLAN:  Type 2 diabetes mellitus without complication, with long-term current use of insulin (HCC)  Other insomnia  Other depression - with emotional eating - Plan: buPROPion (WELLBUTRIN SR) 200 MG 12 hr tablet  Class 2 severe obesity with serious comorbidity and body mass index (BMI) of 37.0 to 37.9 in adult, unspecified obesity type (HCC)  PLAN:  Insomnia Sleep hygiene techniques, such as keeping the bedroom cold, no screen time one hour before bed, no pets and white noise were discussed today. Patient agreed to try these techniques.  Diabetes  II Monica Clark has been given extensive diabetes education by myself today including ideal fasting and post-prandial blood glucose readings, individual ideal Hgb A1c goals and hypoglycemia prevention. We discussed the importance of good blood sugar control to decrease the likelihood of diabetic complications such as nephropathy, neuropathy, limb loss, blindness, coronary artery disease, and death. We discussed the importance of intensive lifestyle modification including diet, exercise and weight loss as  the first line treatment for diabetes. Monica Clark agrees to continue her diabetes medications and diet and we will continue to monitor.   Depression with Emotional Eating Behaviors We discussed behavior modification techniques today to help Monica Clark deal with her emotional eating and depression. She has agreed to increase Wellbutrin SR to 200 mg qAM #30 with no refills and follow up as directed.  Obesity Monica Clark is currently in the action stage of change. As such, her goal is to continue with weight loss efforts She has agreed to follow the Category 2 plan Monica Clark has been instructed to work up to a goal of 150 minutes of combined cardio and strengthening exercise per week for weight loss and overall health benefits. We discussed the following Behavioral Modification Strategies today: emotional eating strategies, ways to avoid boredom eating and ways to avoid night time snacking  Monica Clark has agreed to follow up with our clinic in 2 to 3 weeks. She was informed of the importance of frequent follow up visits to maximize her success with intensive lifestyle modifications for her multiple health conditions.  ALLERGIES: Allergies  Allergen Reactions   Codeine Other (See Comments)    INSOMNIA    MEDICATIONS: Current Outpatient Medications on File Prior to Visit  Medication Sig Dispense Refill   glimepiride (AMARYL) 4 MG tablet Take 4 mg by mouth daily with breakfast.     glucose blood (ONE TOUCH TEST STRIPS) test strip Use as instructed one time a day 100 each 12   Insulin Pen Needle 31G X 5 MM MISC Use once a day 100 each 1   JARDIANCE 25 MG TABS tablet TAKE 1 TABLET BY MOUTH EVERY DAY 30 tablet 2   metFORMIN (GLUCOPHAGE-XR) 500 MG 24 hr tablet Take 4 tablets (2,000 mg total) by mouth daily with supper. 120 tablet 3   SOLIQUA 100-33 UNT-MCG/ML SOPN INJECT 30 UNITS INTO THE SKIN DAILY WITH BREAKFAST. 3 pen 3   tamoxifen (NOLVADEX) 20 MG tablet TAKE 1 TABLET BY MOUTH EVERY DAY 90 tablet 0    triamterene-hydrochlorothiazide (MAXZIDE-25) 37.5-25 MG per tablet Take 1 tablet by mouth every morning. Reported on 09/03/2015     Vitamin D, Ergocalciferol, (DRISDOL) 1.25 MG (50000 UT) CAPS capsule Take 1 capsule (50,000 Units total) by mouth every 7 (seven) days. 4 capsule 0   No current facility-administered medications on file prior to visit.     PAST MEDICAL HISTORY: Past Medical History:  Diagnosis Date   Breast mass, left    duct mass   Depression    Endometrial hyperplasia    Fuchs' corneal dystrophy    Gallbladder problem    Heart murmur    states no known problems, has never seen a cardiologist   Hypertension    states under control with med., has been on med. x 5 yr.   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[place])    Nipple discharge 04/2014   left breast   Non-insulin dependent type 2 diabetes mellitus ([place])     PAST SURGICAL HISTORY: Past Surgical  History:  Procedure Laterality Date   BREAST BIOPSY Left 04/30/2014   Procedure: LEFT BREAST BIOPSY;  Surgeon: Jackolyn Confer, MD;  Location: [place];  Service: General;  Laterality: Left;   BREAST DUCTAL SYSTEM EXCISION Left 04/30/2014   Procedure: LEFT NIPPLE DUCT EXCISION;  Surgeon: Jackolyn Confer, MD;  Location: [place];  Service: General;  Laterality: Left;   [place]   ROBOTIC ASSISTED TOTAL HYSTERECTOMY WITH BILATERAL SALPINGO OOPHERECTOMY Bilateral 10/13/2014   Procedure: ROBOTIC ASSISTED TOTAL HYSTERECTOMY WITH BILATERAL SALPINGO OOPHORECTOMY ;  Surgeon: Everitt Amber, MD;  Location: [facility];  Service: Gynecology;  Laterality: Bilateral;    SOCIAL HISTORY: Social History   Tobacco Use   Smoking status: Never Smoker   Smokeless tobacco: Never Used  Substance Use Topics   Alcohol use: Yes    Comment: rarely   Drug use: No    FAMILY HISTORY: Family History  Problem  Relation Age of Onset   Diabetes Mother    Obesity Mother    Heart disease Father    Cancer Father        throat cancer    Sudden death Father    Alcoholism Father    Diabetes Sister    Diabetes Brother     ROS: Review of Systems  Constitutional: Positive for malaise/fatigue and weight loss.  Endo/Heme/Allergies:       Negative for hypoglycemia  Psychiatric/Behavioral: Positive for depression. Negative for suicidal ideas. The patient has insomnia.     PHYSICAL EXAM: Pt in no acute distress  RECENT LABS AND TESTS: BMET    Component Value Date/Time   NA 143 04/16/2018 1229   NA 141 03/03/2016 1226   K 3.7 04/16/2018 1229   K 3.5 03/03/2016 1226   CL 104 04/16/2018 1229   CO2 21 04/16/2018 1229   CO2 24 03/03/2016 1226   GLUCOSE 183 (H) 04/16/2018 1229   GLUCOSE 242 (H) 03/22/2017 1220   GLUCOSE 113 03/03/2016 1226   BUN 12 04/16/2018 1229   BUN 8.5 03/03/2016 1226   CREATININE 0.89 04/16/2018 1229   CREATININE 0.8 03/03/2016 1226   CALCIUM 10.0 04/16/2018 1229   CALCIUM 10.0 03/03/2016 1226   GFRNONAA 70 04/16/2018 1229   GFRAA 81 04/16/2018 1229   Lab Results  Component Value Date   HGBA1C 9.0 (H) 04/16/2018   HGBA1C 8.2 06/20/2017   HGBA1C 15.5 02/09/2017   HGBA1C 11.1 (H) 10/26/2016   HGBA1C (H) 10/24/2008    7.2 (NOTE) The ADA recommends the following therapeutic goal for glycemic control related to Hgb A1c measurement: Goal of therapy: <6.5 Hgb A1c  Reference: American Diabetes Association: Clinical Practice Recommendations 2010, Diabetes Care, 2010, 33: (Suppl  1).   Lab Results  Component Value Date   INSULIN 8.7 04/16/2018   CBC    Component Value Date/Time   WBC 12.1 (H) 04/16/2018 1229   WBC 11.2 (H) 03/22/2017 1220   RBC 4.95 04/16/2018 1229   RBC 4.71 03/22/2017 1220   HGB 15.0 04/16/2018 1229   HGB 13.8 03/03/2016 1226   HCT 45.0 04/16/2018 1229   HCT 41.1 03/03/2016 1226   PLT 420 (H) 03/22/2017 1220   PLT 346 03/03/2016  1226   MCV 91 04/16/2018 1229   MCV 91.7 03/03/2016 1226   MCH 30.3 04/16/2018 1229   MCH 30.7 03/22/2017 1220   MCHC 33.3 04/16/2018 1229   MCHC 33.2 03/22/2017 1220   RDW 14.1 04/16/2018 1229  RDW 14.1 03/03/2016 1226   LYMPHSABS 3.4 (H) 04/16/2018 1229   LYMPHSABS 2.8 03/03/2016 1226   MONOABS 0.4 03/22/2017 1220   MONOABS 0.3 03/03/2016 1226   EOSABS 0.2 04/16/2018 1229   BASOSABS 0.1 04/16/2018 1229   BASOSABS 0.1 03/03/2016 1226   Iron/TIBC/Ferritin/ %Sat No results found for: IRON, TIBC, FERRITIN, IRONPCTSAT Lipid Panel     Component Value Date/Time   CHOL 216 (H) 04/16/2018 1229   TRIG 237 (H) 04/16/2018 1229   HDL 43 04/16/2018 1229   CHOLHDL 6 10/26/2016 0820   VLDL 49 (H) 10/24/2008 0940   LDLCALC 126 (H) 04/16/2018 1229   LDLDIRECT 95.0 10/26/2016 0820   Hepatic Function Panel     Component Value Date/Time   PROT 7.8 04/16/2018 1229   PROT 8.2 03/03/2016 1226   ALBUMIN 4.4 04/16/2018 1229   ALBUMIN 3.7 03/03/2016 1226   AST 27 04/16/2018 1229   AST 72 (H) 03/03/2016 1226   ALT 12 04/16/2018 1229   ALT 46 03/03/2016 1226   ALKPHOS 99 04/16/2018 1229   ALKPHOS 118 03/03/2016 1226   BILITOT 0.4 04/16/2018 1229   BILITOT 0.61 03/03/2016 1226     Results for ULIANA, BRINKER (MRN [id number]) as of 06/04/2018 18:03  Ref. Range 04/16/2018 12:29  Vitamin D, 25-Hydroxy Latest Ref Range: 30.0 - 100.0 ng/mL 8.3 (L)    I, Doreene Nest, am acting as Location manager for Dennard Nip, MD I have reviewed the above documentation for accuracy and completeness, and I agree with the above. -Dennard Nip, MD

## 2018-06-12 ENCOUNTER — Telehealth: Payer: Self-pay | Admitting: Hematology

## 2018-06-12 NOTE — Progress Notes (Signed)
[facility]   [phone] [phone]   Clinic Follow up Note   Patient Care Team: [name], MD as PCP - General (Family Medicine) Everitt Amber, MD as Consulting Physician (Obstetrics and Gynecology)   I connected with Hal Neer on 06/14/2018 at  3:30 PM EDT by telephone visit and verified that I am speaking with the correct person using two identifiers.  I discussed the limitations, risks, security and privacy concerns of performing an evaluation and management service by telephone and the availability of in person appointments. I also discussed with the patient that there may be a patient responsible charge related to this service. The patient expressed understanding and agreed to proceed.   Patient's location:  At home  Provider's location:  My Office   CHIEF COMPLAINT: Follow up left breast atypical ductal hyperplasia   CURRENT THERAPY:  Tamoxifen 20 mg daily, started on 11/30/2014. Plan to complete in 11/2019  INTERVAL HISTORY:  DNIYA NEUHAUS is here for a follow up of left breast ADH. She was last seen by me 15 months ago. She was able to identify herself by birth date.  She notes she is doing well. She notes she is tolerating Tamoxifen well with manageable hot flashes. These occur more at night. She notes se does not get enough sleep, about 4 hours.    REVIEW OF SYSTEMS:   Constitutional: Denies fevers, chills or abnormal weight loss (+) hot flashes (+) trouble sleeping Eyes: Denies blurriness of vision Ears, nose, mouth, throat, and face: Denies mucositis or sore throat Respiratory: Denies cough, dyspnea or wheezes Cardiovascular: Denies palpitation, chest discomfort or lower extremity swelling Gastrointestinal:  Denies nausea, heartburn or change in bowel habits Skin: Denies abnormal skin rashes Lymphatics: Denies new lymphadenopathy or easy bruising Neurological:Denies numbness, tingling or new weaknesses Behavioral/Psych:  Mood is stable, no new changes  All other systems were reviewed with the patient and are negative.  MEDICAL HISTORY:  Past Medical History:  Diagnosis Date  . Breast mass, left    duct mass  . Depression   . Endometrial hyperplasia   . Fuchs' corneal dystrophy   . Gallbladder problem   . Heart murmur    states no known problems, has never seen a cardiologist  . Hypertension    states under control with med., has been on med. x 5 yr.  . Immature cataract   . Lower extremity edema   . 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 Nipple discharge 04/2014   left breast  . Non-insulin dependent type 2 diabetes mellitus ([place])     SURGICAL HISTORY: Past Surgical History:  Procedure Laterality Date  . BREAST BIOPSY Left 04/30/2014   Procedure: LEFT BREAST BIOPSY;  Surgeon: Jackolyn Confer, MD;  Location: [facility];  Service: General;  Laterality: Left;  . BREAST DUCTAL SYSTEM EXCISION Left 04/30/2014   Procedure: LEFT NIPPLE DUCT EXCISION;  Surgeon: Jackolyn Confer, MD;  Location: [facility];  Service: General;  Laterality: Left;  . [place]  . CHOLECYSTECTOMY  1989  . ROBOTIC ASSISTED TOTAL HYSTERECTOMY WITH BILATERAL SALPINGO OOPHERECTOMY Bilateral 10/13/2014   Procedure: ROBOTIC ASSISTED TOTAL HYSTERECTOMY WITH BILATERAL SALPINGO OOPHORECTOMY ;  Surgeon: Everitt Amber, MD;  Location: [facility];  Service: Gynecology;  Laterality: Bilateral;    I have reviewed the social history and family history with the patient and they are unchanged from previous note.  ALLERGIES:  is allergic to codeine.  MEDICATIONS:  Current Outpatient Medications  Medication Sig Dispense Refill  . buPROPion (WELLBUTRIN SR) 200 MG 12 hr tablet Take 1 tablet (200 mg total) by mouth daily. 30 tablet 0  . glimepiride (AMARYL) 4 MG tablet Take 4 mg by mouth daily with breakfast.    . glucose blood (ONE TOUCH TEST STRIPS) test strip Use as instructed one time a day 100 each  12  . Insulin Pen Needle 31G X 5 MM MISC Use once a day 100 each 1  . JARDIANCE 25 MG TABS tablet TAKE 1 TABLET BY MOUTH EVERY DAY 30 tablet 2  . metFORMIN (GLUCOPHAGE-XR) 500 MG 24 hr tablet Take 4 tablets (2,000 mg total) by mouth daily with supper. 120 tablet 3  . SOLIQUA 100-33 UNT-MCG/ML SOPN INJECT 30 UNITS INTO THE SKIN DAILY WITH BREAKFAST. 3 pen 3  . tamoxifen (NOLVADEX) 20 MG tablet Take 1 tablet (20 mg total) by mouth daily. 90 tablet 3  . triamterene-hydrochlorothiazide (MAXZIDE-25) 37.5-25 MG per tablet Take 1 tablet by mouth every morning. Reported on 09/03/2015    . Vitamin D, Ergocalciferol, (DRISDOL) 1.25 MG (50000 UT) CAPS capsule Take 1 capsule (50,000 Units total) by mouth every 7 (seven) days. 4 capsule 0   No current facility-administered medications for this visit.     PHYSICAL EXAMINATION: ECOG PERFORMANCE STATUS: 0 - Asymptomatic  No vitals taken today, Exam not performed today  LABORATORY DATA:  I have reviewed the data as listed CBC Latest Ref Rng & Units 04/16/2018 03/22/2017 02/10/2017  WBC 3.4 - 10.8 x10E3/uL 12.1(H) 11.2(H) 9.5  Hemoglobin 11.1 - 15.9 g/dL 15.0 14.5 16.1(H)  Hematocrit 34.0 - 46.6 % 45.0 43.5 47.6(H)  Platelets 145 - 400 K/uL - 420(H) 280     CMP Latest Ref Rng & Units 04/16/2018 03/22/2017 02/10/2017  Glucose 65 - 99 mg/dL 183(H) 242(H) 319(H)  BUN 8 - 27 mg/dL 12 11 12   Creatinine 0.57 - 1.00 mg/dL 0.89 0.95 0.86  Sodium 134 - 144 mmol/L 143 139 132(L)  Potassium 3.5 - 5.2 mmol/L 3.7 4.0 3.8  Chloride 96 - 106 mmol/L 104 103 95(L)  CO2 20 - 29 mmol/L 21 26 20(L)  Calcium 8.7 - 10.3 mg/dL 10.0 10.2 10.0  Total Protein 6.0 - 8.5 g/dL 7.8 8.7(H) -  Total Bilirubin 0.0 - 1.2 mg/dL 0.4 0.5 -  Alkaline Phos 39 - 117 IU/L 99 114 -  AST 0 - 40 IU/L 27 24 -  ALT 0 - 32 IU/L 12 17 -      RADIOGRAPHIC STUDIES: I have personally reviewed the radiological images as listed and agreed with the findings in the report. No results found.    ASSESSMENT & PLAN:  Monica Clark is a 63 y.o. female with   1. left breast atypical lobular hyperplasia -She was diagnosed in 04/30/14 with ADH.  -She is on chemoprevention with tamoxifen since 11/30/14, tolerating well, we'll continue for total 5 years to complete in 11/2019.  -She is clinically doing well. 04/2018 mammogram was unremarkable. There is no clinical evidence of recurrence.  -Continue Surveillance, next mammogram in 04/2019.  -I encouraged her to continue with [facility].  -Continue Tamoxifen, refilled today, she will complete 5 year therapy in 11/2019 -F/u in 4 months, then yearly.    2. Stage I endometrial cancer, 2016 -She underwent total hysterectomy with BSO on 10/13/14 by Dr. Denman George  -She continues to be followed by her GYN.   -Given no other family history of  malignancy, she does not meet the criteria for genetic testing (I previously spoke with our genetic counselor)   3. HTN, DM -She'll continue follow-up with her primary care physician -Her diabetes has gotten worse since she started tamoxifen, possible related, I encouraged her follow-up with her endocrinologist, eat healthy and exercise regularly.  4. Transaminitis  -She had a CT AP in 11/30/16 which showed evidence of diffuse fatty infiltration of the liver.  -She is currently participating in [facility]. I encouraged her to continue.   5. Hot flashes, insomnia  -Secondary to Tamoxifen  -currently manageable. I offered her medication to help and she declined for now.   Plan -Continue Tamoxifen, refilled today  -Lab and f/u in 4 months    No problem-specific Assessment & Plan notes found for this encounter.   No orders of the defined types were placed in this encounter.  I discussed the assessment and treatment plan with the patient. The patient was provided an opportunity to ask questions and all were answered. The patient agreed with  the plan and demonstrated an understanding of the instructions.  The patient was advised to call back or seek an in-person evaluation if the symptoms worsen or if the condition fails to improve as anticipated.  I provided 10 minutes of non face-to-face telephone visit time during this encounter, and > 50% was spent counseling as documented under my assessment & plan.    Truitt Merle, MD 06/14/2018   I, Joslyn Devon, am acting as scribe for Truitt Merle, MD.   I have reviewed the above documentation for accuracy and completeness, and I agree with the above.

## 2018-06-12 NOTE — Telephone Encounter (Signed)
Spoke with patient regarding appointment on Friday - she had the [employer] and I emailed her the appointment information.

## 2018-06-14 ENCOUNTER — Encounter: Payer: Self-pay | Admitting: Hematology

## 2018-06-14 ENCOUNTER — Telehealth: Payer: Self-pay | Admitting: Hematology

## 2018-06-14 ENCOUNTER — Inpatient Hospital Stay: Payer: Self-pay | Attending: Hematology | Admitting: Hematology

## 2018-06-14 ENCOUNTER — Other Ambulatory Visit: Payer: Self-pay

## 2018-06-14 DIAGNOSIS — C541 Malignant neoplasm of endometrium: Secondary | ICD-10-CM

## 2018-06-14 DIAGNOSIS — Z90710 Acquired absence of both cervix and uterus: Secondary | ICD-10-CM

## 2018-06-14 DIAGNOSIS — Z79810 Long term (current) use of selective estrogen receptor modulators (SERMs): Secondary | ICD-10-CM

## 2018-06-14 DIAGNOSIS — I10 Essential (primary) hypertension: Secondary | ICD-10-CM

## 2018-06-14 DIAGNOSIS — Z7984 Long term (current) use of oral hypoglycemic drugs: Secondary | ICD-10-CM

## 2018-06-14 DIAGNOSIS — Z79899 Other long term (current) drug therapy: Secondary | ICD-10-CM

## 2018-06-14 DIAGNOSIS — E119 Type 2 diabetes mellitus without complications: Secondary | ICD-10-CM

## 2018-06-14 DIAGNOSIS — N6092 Unspecified benign mammary dysplasia of left breast: Secondary | ICD-10-CM

## 2018-06-14 DIAGNOSIS — Z794 Long term (current) use of insulin: Secondary | ICD-10-CM

## 2018-06-14 MED ORDER — TAMOXIFEN CITRATE 20 MG PO TABS: 20.0000 mg | ORAL_TABLET | Freq: Every day | ORAL | 3 refills | Status: DC

## 2018-06-14 NOTE — Telephone Encounter (Signed)
Scheduled appt per 4/24 los. ° °A calendar will be mailed out. °

## 2018-06-16 ENCOUNTER — Other Ambulatory Visit (INDEPENDENT_AMBULATORY_CARE_PROVIDER_SITE_OTHER): Payer: Self-pay | Admitting: Family Medicine

## 2018-06-16 DIAGNOSIS — E559 Vitamin D deficiency, unspecified: Secondary | ICD-10-CM

## 2018-06-20 ENCOUNTER — Telehealth: Payer: Self-pay | Admitting: Hematology

## 2018-06-20 NOTE — Telephone Encounter (Signed)
I received a message from her pharmacy [facility], regarding the interaction of tamoxifen and Wellbutrin.  She started Wellbutrin 2 weeks ago by Dr. Leafy Ro to help her weight loss. I checked the interaction with our pharmacist, Wellbutrin will decrease the concentration of Tamoxifen.  The interaction is moderate.  I called patient, and suggest her to stop Wellbutrin, she agrees.  I will copy Dr. Leafy Ro to see if she can try something else for her weight loss.   Truitt Merle  06/20/2018

## 2018-06-20 NOTE — Telephone Encounter (Signed)
Thank you for your help.  Monica Clark [place]

## 2018-06-25 ENCOUNTER — Ambulatory Visit (INDEPENDENT_AMBULATORY_CARE_PROVIDER_SITE_OTHER): Payer: BC Managed Care – PPO | Admitting: Family Medicine

## 2018-06-25 ENCOUNTER — Other Ambulatory Visit: Payer: Self-pay

## 2018-06-25 ENCOUNTER — Encounter (INDEPENDENT_AMBULATORY_CARE_PROVIDER_SITE_OTHER): Payer: Self-pay | Admitting: Family Medicine

## 2018-06-25 DIAGNOSIS — Z6837 Body mass index (BMI) 37.0-37.9, adult: Secondary | ICD-10-CM

## 2018-06-25 DIAGNOSIS — E559 Vitamin D deficiency, unspecified: Secondary | ICD-10-CM

## 2018-06-25 MED ORDER — VITAMIN D (ERGOCALCIFEROL) 1.25 MG (50000 UNIT) PO CAPS: 50000.0000 [IU] | ORAL_CAPSULE | ORAL | 0 refills | Status: DC

## 2018-06-25 NOTE — Progress Notes (Signed)
Office: [phone]  /  Fax: [phone] TeleHealth Visit:  Monica Clark has verbally consented to this TeleHealth visit today. The patient is located at home, the provider is located at the [facility] office. The participants in this visit include the listed provider and patient. The visit was conducted today via Face Time.  HPI:   Chief Complaint: OBESITY Monica Clark is here to discuss her progress with her obesity treatment plan. She is on the Category 2 plan and is following her eating plan approximately 75 % of the time. She states she is exercising 0 minutes 0 times per week. 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 She has done well maintaining her weight during this time, which is very good.  We were unable to weigh the patient today for this TeleHealth visit. She feels as if she has maintained weight since her last visit. She has lost 2 lbs since starting treatment with [place].  Vitamin D Deficiency [name] has a diagnosis of vitamin D deficiency. She is currently stable on vit D and notes no change in fatigue. [name] denies nausea, vomiting, or muscle weakness.  ASSESSMENT AND PLAN:  Vitamin D deficiency - Plan: Vitamin D, Ergocalciferol, (DRISDOL) 1.25 MG (50000 UT) CAPS capsule  Class 2 severe obesity with serious comorbidity and body mass index (BMI) of 37.0 to 37.9 in adult, unspecified obesity type ([place])  PLAN:  Vitamin D Deficiency [name] was informed that low vitamin D levels contribute to fatigue and are associated with obesity, breast, and colon cancer. Asencion agrees to continue to take prescription Vit D @50 ,000 IU every week #4 with no refills and will follow up for routine testing of vitamin D, at least 2-3 times per year. She was informed of the risk of over-replacement of vitamin D and agrees to not increase her dose unless she discusses this with [place] first. We  will check labs in 1 month. [name] agrees to follow up in 2 weeks as directed.  Obesity [name] is currently in the action stage of change. As such, her goal is to continue with weight loss efforts. She has agreed to follow the Category 2 plan. [name] has been instructed to work up to a goal of 150 minutes of combined cardio and strengthening exercise per week for weight loss and overall health benefits. We discussed the following Behavioral Modification Strategies today: emotional eating strategies, keeping healthy foods in the home, and ways to avoid boredom eating.  [name] has agreed to follow up with our clinic in 2 weeks. She was informed of the importance of frequent follow up visits to maximize her success with intensive lifestyle modifications for her multiple health conditions.  ALLERGIES: Allergies  Allergen Reactions   Codeine Other (See Comments)    INSOMNIA    MEDICATIONS: Current Outpatient Medications on File Prior to Visit  Medication Sig Dispense Refill   buPROPion (WELLBUTRIN SR) 200 MG 12 hr tablet Take 1 tablet (200 mg total) by mouth daily. 30 tablet 0   glimepiride (AMARYL) 4 MG tablet Take 4 mg by mouth daily with breakfast.     glucose blood (ONE TOUCH TEST STRIPS) test strip Use as instructed one time a day 100 each 12   Insulin Pen Needle 31G X 5 MM MISC Use once a day 100 each 1   JARDIANCE 25 MG TABS tablet TAKE 1 TABLET BY MOUTH  EVERY DAY 30 tablet 2   metFORMIN (GLUCOPHAGE-XR) 500 MG 24 hr tablet Take 4 tablets (2,000 mg total) by mouth daily with supper. 120 tablet 3   SOLIQUA 100-33 UNT-MCG/ML SOPN INJECT 30 UNITS INTO THE SKIN DAILY WITH BREAKFAST. 3 pen 3   tamoxifen (NOLVADEX) 20 MG tablet Take 1 tablet (20 mg total) by mouth daily. 90 tablet 3   triamterene-hydrochlorothiazide (MAXZIDE-25) 37.5-25 MG per tablet Take 1 tablet by mouth every morning. Reported on 09/03/2015     Vitamin D, Ergocalciferol, (DRISDOL) 1.25 MG (50000 UT) CAPS capsule  Take 1 capsule (50,000 Units total) by mouth every 7 (seven) days. 4 capsule 0   No current facility-administered medications on file prior to visit.     PAST MEDICAL HISTORY: Past Medical History:  Diagnosis Date   Breast mass, left    duct mass   Depression    Endometrial hyperplasia    Fuchs' corneal dystrophy    Gallbladder problem    Heart murmur    states no known problems, has never seen a cardiologist   Hypertension    states under control with med., has been on med. x 5 yr.   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[place])    Nipple discharge 04/2014   left breast   Non-insulin dependent type 2 diabetes mellitus ([place])     PAST SURGICAL HISTORY: Past Surgical History:  Procedure Laterality Date   BREAST BIOPSY Left 04/30/2014   Procedure: LEFT BREAST BIOPSY;  Surgeon: Jackolyn Confer, MD;  Location: [facility];  Service: General;  Laterality: Left;   BREAST DUCTAL SYSTEM EXCISION Left 04/30/2014   Procedure: LEFT NIPPLE DUCT EXCISION;  Surgeon: Jackolyn Confer, MD;  Location: [facility];  Service: General;  Laterality: Left;   [place]   ROBOTIC ASSISTED TOTAL HYSTERECTOMY WITH BILATERAL SALPINGO OOPHERECTOMY Bilateral 10/13/2014   Procedure: ROBOTIC ASSISTED TOTAL HYSTERECTOMY WITH BILATERAL SALPINGO OOPHORECTOMY ;  Surgeon: Everitt Amber, MD;  Location[facility];  Service: Gynecology;  Laterality: Bilateral;    SOCIAL HISTORY: Social History   Tobacco Use   Smoking status: Never Smoker   Smokeless tobacco: Never Used  Substance Use Topics   Alcohol use: Yes    Comment: rarely   Drug use: No    FAMILY HISTORY: Family History  Problem Relation Age of Onset   Diabetes Mother    Obesity Mother    Heart disease Father    Cancer Father        throat cancer    Sudden death Father    Alcoholism Father    Diabetes Sister     Diabetes Brother     ROS: Review of Systems  Constitutional: Positive for malaise/fatigue.  Gastrointestinal: Negative for nausea and vomiting.  Musculoskeletal:       Negative for muscle weakness.    PHYSICAL EXAM: Pt in no acute distress  RECENT LABS AND TESTS: BMET    Component Value Date/Time   NA 143 04/16/2018 1229   NA 141 03/03/2016 1226   K 3.7 04/16/2018 1229   K 3.5 03/03/2016 1226   CL 104 04/16/2018 1229   CO2 21 04/16/2018 1229   CO2 24 03/03/2016 1226   GLUCOSE 183 (H) 04/16/2018 1229   GLUCOSE 242 (H) 03/22/2017 1220   GLUCOSE 113 03/03/2016 1226   BUN 12 04/16/2018 1229   BUN 8.5 03/03/2016 1226   CREATININE  0.89 04/16/2018 1229   CREATININE 0.8 03/03/2016 1226   CALCIUM 10.0 04/16/2018 1229   CALCIUM 10.0 03/03/2016 1226   GFRNONAA 70 04/16/2018 1229   GFRAA 81 04/16/2018 1229   Lab Results  Component Value Date   HGBA1C 9.0 (H) 04/16/2018   HGBA1C 8.2 06/20/2017   HGBA1C 15.5 02/09/2017   HGBA1C 11.1 (H) 10/26/2016   HGBA1C (H) 10/24/2008    7.2 (NOTE) The ADA recommends the following therapeutic goal for glycemic control related to Hgb A1c measurement: Goal of therapy: <6.5 Hgb A1c  Reference: American Diabetes Association: Clinical Practice Recommendations 2010, Diabetes Care, 2010, 33: (Suppl  1).   Lab Results  Component Value Date   INSULIN 8.7 04/16/2018   CBC    Component Value Date/Time   WBC 12.1 (H) 04/16/2018 1229   WBC 11.2 (H) 03/22/2017 1220   RBC 4.95 04/16/2018 1229   RBC 4.71 03/22/2017 1220   HGB 15.0 04/16/2018 1229   HGB 13.8 03/03/2016 1226   HCT 45.0 04/16/2018 1229   HCT 41.1 03/03/2016 1226   PLT 420 (H) 03/22/2017 1220   PLT 346 03/03/2016 1226   MCV 91 04/16/2018 1229   MCV 91.7 03/03/2016 1226   MCH 30.3 04/16/2018 1229   MCH 30.7 03/22/2017 1220   MCHC 33.3 04/16/2018 1229   MCHC 33.2 03/22/2017 1220   RDW 14.1 04/16/2018 1229   RDW 14.1 03/03/2016 1226   LYMPHSABS 3.4 (H) 04/16/2018 1229    LYMPHSABS 2.8 03/03/2016 1226   MONOABS 0.4 03/22/2017 1220   MONOABS 0.3 03/03/2016 1226   EOSABS 0.2 04/16/2018 1229   BASOSABS 0.1 04/16/2018 1229   BASOSABS 0.1 03/03/2016 1226   Iron/TIBC/Ferritin/ %Sat No results found for: IRON, TIBC, FERRITIN, IRONPCTSAT Lipid Panel     Component Value Date/Time   CHOL 216 (H) 04/16/2018 1229   TRIG 237 (H) 04/16/2018 1229   HDL 43 04/16/2018 1229   CHOLHDL 6 10/26/2016 0820   VLDL 49 (H) 10/24/2008 0940   LDLCALC 126 (H) 04/16/2018 1229   LDLDIRECT 95.0 10/26/2016 0820   Hepatic Function Panel     Component Value Date/Time   PROT 7.8 04/16/2018 1229   PROT 8.2 03/03/2016 1226   ALBUMIN 4.4 04/16/2018 1229   ALBUMIN 3.7 03/03/2016 1226   AST 27 04/16/2018 1229   AST 72 (H) 03/03/2016 1226   ALT 12 04/16/2018 1229   ALT 46 03/03/2016 1226   ALKPHOS 99 04/16/2018 1229   ALKPHOS 118 03/03/2016 1226   BILITOT 0.4 04/16/2018 1229   BILITOT 0.61 03/03/2016 1226    Results for AFTEN, LIPSEY (MRN [id number]) as of 06/25/2018 12:21  Ref. Range 04/16/2018 12:29  Vitamin D, 25-Hydroxy Latest Ref Range: 30.0 - 100.0 ng/mL 8.3 (L)     I, Marcille Blanco, CMA, am acting as transcriptionist for Starlyn Skeans, MD I have reviewed the above documentation for accuracy and completeness, and I agree with the above. -Dennard Nip, MD

## 2018-06-27 ENCOUNTER — Other Ambulatory Visit (INDEPENDENT_AMBULATORY_CARE_PROVIDER_SITE_OTHER): Payer: Self-pay | Admitting: Family Medicine

## 2018-06-27 DIAGNOSIS — F3289 Other specified depressive episodes: Secondary | ICD-10-CM

## 2018-07-02 ENCOUNTER — Other Ambulatory Visit (INDEPENDENT_AMBULATORY_CARE_PROVIDER_SITE_OTHER): Payer: Self-pay | Admitting: Family Medicine

## 2018-07-02 DIAGNOSIS — F3289 Other specified depressive episodes: Secondary | ICD-10-CM

## 2018-07-05 ENCOUNTER — Other Ambulatory Visit: Payer: Self-pay | Admitting: Endocrinology

## 2018-07-09 ENCOUNTER — Ambulatory Visit (INDEPENDENT_AMBULATORY_CARE_PROVIDER_SITE_OTHER): Payer: Self-pay | Admitting: Family Medicine

## 2018-07-17 ENCOUNTER — Other Ambulatory Visit (INDEPENDENT_AMBULATORY_CARE_PROVIDER_SITE_OTHER): Payer: Self-pay | Admitting: Family Medicine

## 2018-07-17 DIAGNOSIS — E559 Vitamin D deficiency, unspecified: Secondary | ICD-10-CM

## 2018-07-21 ENCOUNTER — Other Ambulatory Visit: Payer: Self-pay | Admitting: Endocrinology

## 2018-07-24 ENCOUNTER — Ambulatory Visit (INDEPENDENT_AMBULATORY_CARE_PROVIDER_SITE_OTHER): Payer: BC Managed Care – PPO | Admitting: Family Medicine

## 2018-07-24 ENCOUNTER — Encounter (INDEPENDENT_AMBULATORY_CARE_PROVIDER_SITE_OTHER): Payer: Self-pay | Admitting: Family Medicine

## 2018-07-24 ENCOUNTER — Other Ambulatory Visit: Payer: Self-pay

## 2018-07-24 DIAGNOSIS — F3289 Other specified depressive episodes: Secondary | ICD-10-CM | POA: Diagnosis not present

## 2018-07-24 DIAGNOSIS — Z6837 Body mass index (BMI) 37.0-37.9, adult: Secondary | ICD-10-CM

## 2018-07-24 DIAGNOSIS — E559 Vitamin D deficiency, unspecified: Secondary | ICD-10-CM

## 2018-07-24 MED ORDER — TOPIRAMATE 25 MG PO TABS: 25.0000 mg | ORAL_TABLET | Freq: Every day | ORAL | 0 refills | Status: DC

## 2018-07-24 MED ORDER — VITAMIN D (ERGOCALCIFEROL) 1.25 MG (50000 UNIT) PO CAPS: 50000.0000 [IU] | ORAL_CAPSULE | ORAL | 0 refills | Status: DC

## 2018-07-25 NOTE — Progress Notes (Signed)
Office: [phone]  /  Fax: [phone] TeleHealth Visit:  Monica Clark has verbally consented to this TeleHealth visit today. The patient is located at home, the provider is located at the [employer] [facility] office. The participants in this visit include the listed provider and patient. The visit was conducted today via Face Time.  HPI:   Chief Complaint: OBESITY Monica Clark is here to discuss her progress with her obesity treatment plan. She is on the Category 2 plan and is following her eating plan approximately 75 to 80 % of the time. She states she is doing video walking 15 minutes 3 times per week. [name] feels that she has maintained her weight well on her Category 2 plan. Her hunger is controlled, but she is getting bored with her breakfast and would like to look at other options.  We were unable to weigh the patient today for this TeleHealth visit. She feels as if she has maintained weight since her last visit. She has lost 2 lbs since starting treatment with [place].  Vitamin D Deficiency Monica Clark has a diagnosis of vitamin D deficiency. She is currently stable on vit D, but is not yet at goal. Monica Clark denies nausea, vomiting, or muscle weakness.  Depression with emotional eating behaviors Monica Clark was unable to start Wellbutrin due to being on tamoxifen. She is still struggling with cravings and stress eating, as well as insomnia. She denies a history of nephrolithiasis. Monica Clark is struggling with emotional eating and using food for comfort to the extent that it is negatively impacting her health. She often snacks when she is not hungry. Monica Clark sometimes feels she is out of control and then feels guilty that she made poor food choices. She has been working on behavior modification techniques to help reduce her emotional eating and has been somewhat successful. She shows no sign of suicidal or homicidal ideations.  Depression screen [facility] 2/9 04/16/2018 07/10/2017 09/03/2015  Decreased Interest 2 0  0  Down, Depressed, Hopeless 1 0 0  PHQ - 2 Score 3 0 0  Altered sleeping 1 - -  Tired, decreased energy 3 - -  Change in appetite 3 - -  Feeling bad or failure about yourself  2 - -  Trouble concentrating 2 - -  Moving slowly or fidgety/restless 0 - -  Suicidal thoughts 0 - -  PHQ-9 Score 14 - -  Difficult doing work/chores Somewhat difficult - -   ASSESSMENT AND PLAN:  Vitamin D deficiency - Plan: Vitamin D, Ergocalciferol, (DRISDOL) 1.25 MG (50000 UT) CAPS capsule  Other depression - Plan: topiramate (TOPAMAX) 25 MG tablet  Class 2 severe obesity with serious comorbidity and body mass index (BMI) of 37.0 to 37.9 in adult, unspecified obesity type (HCC)  PLAN:  Vitamin D Deficiency Monica Clark was informed that low vitamin D levels contribute to fatigue and are associated with obesity, breast, and colon cancer. Pati agrees to continue to take prescription Vit D @50 ,000 IU every week #4 with no refills and will follow up for routine testing of vitamin D, at least 2-3 times per year. She was informed of the risk of over-replacement of vitamin D and agrees to not increase her dose unless she discusses this with [place] first. Monica Clark agrees to follow up in 2 weeks as directed.  Depression with Emotional Eating Behaviors We discussed behavior modification techniques today to help Monica Clark deal with her emotional eating and depression. She has agreed to discontinue Wellbutrin and will start topiramate 25 mg  qhs # 30 with no refills and agreed to follow up as directed.  Obesity Monica Clark is currently in the action stage of change. As such, her goal is to continue with weight loss efforts. She has agreed to follow the Category 2 plan. Monica Clark has been instructed to work up to a goal of 150 minutes of combined cardio and strengthening exercise per week for weight loss and overall health benefits. We discussed the following Behavioral Modification Strategies today: increase H2O intake.  [name] has agreed to  follow up with our clinic in 2 weeks. She was informed of the importance of frequent follow up visits to maximize her success with intensive lifestyle modifications for her multiple health conditions.  ALLERGIES: Allergies  Allergen Reactions  . Codeine Other (See Comments)    INSOMNIA    MEDICATIONS: Current Outpatient Medications on File Prior to Visit  Medication Sig Dispense Refill  . glimepiride (AMARYL) 4 MG tablet Take 4 mg by mouth daily with breakfast.    . glucose blood (ONE TOUCH TEST STRIPS) test strip Use as instructed one time a day 100 each 12  . Insulin Pen Needle 31G X 5 MM MISC Use once a day 100 each 1  . JARDIANCE 25 MG TABS tablet TAKE 1 TABLET BY MOUTH EVERY DAY 30 tablet 2  . metFORMIN (GLUCOPHAGE-XR) 500 MG 24 hr tablet Take 4 tablets (2,000 mg total) by mouth daily with supper. 120 tablet 3  . SOLIQUA 100-33 UNT-MCG/ML SOPN INJECT 30 UNITS INTO THE SKIN DAILY WITH BREAKFAST. 3 pen 3  . tamoxifen (NOLVADEX) 20 MG tablet Take 1 tablet (20 mg total) by mouth daily. 90 tablet 3  . triamterene-hydrochlorothiazide (MAXZIDE-25) 37.5-25 MG per tablet Take 1 tablet by mouth every morning. Reported on 09/03/2015     No current facility-administered medications on file prior to visit.     PAST MEDICAL HISTORY: Past Medical History:  Diagnosis Date  . Breast mass, left    duct mass  . Depression   . Endometrial hyperplasia   . Fuchs' corneal dystrophy   . Gallbladder problem   . Heart murmur    states no known problems, has never seen a cardiologist  . Hypertension    states under control with med., has been on med. x 5 yr.  . Immature cataract   . Lower extremity edema   . 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 Nipple discharge 04/2014   left breast  . Non-insulin dependent type 2 diabetes mellitus ([place])     PAST SURGICAL HISTORY: Past Surgical History:  Procedure Laterality Date  . BREAST BIOPSY Left 04/30/2014   Procedure: LEFT BREAST BIOPSY;   Surgeon: Jackolyn Confer, MD;  Location: [place];  Service: General;  Laterality: Left;  . BREAST DUCTAL SYSTEM EXCISION Left 04/30/2014   Procedure: LEFT NIPPLE DUCT EXCISION;  Surgeon: Jackolyn Confer, MD;  Location: [place];  Service: General;  Laterality: Left;  . [place]  . CHOLECYSTECTOMY  1989  . ROBOTIC ASSISTED TOTAL HYSTERECTOMY WITH BILATERAL SALPINGO OOPHERECTOMY Bilateral 10/13/2014   Procedure: ROBOTIC ASSISTED TOTAL HYSTERECTOMY WITH BILATERAL SALPINGO OOPHORECTOMY ;  Surgeon: Everitt Amber, MD;  Location: [facility];  Service: Gynecology;  Laterality: Bilateral;    SOCIAL HISTORY: Social History   Tobacco Use  . Smoking status: Never Smoker  . Smokeless tobacco: Never Used  Substance Use Topics  . Alcohol use: Yes    Comment: rarely  . Drug use: No  FAMILY HISTORY: Family History  Problem Relation Age of Onset  . Diabetes Mother   . Obesity Mother   . Heart disease Father   . Cancer Father        throat cancer   . Sudden death Father   . Alcoholism Father   . Diabetes Sister   . Diabetes Brother     ROS: Review of Systems  Gastrointestinal: Negative for nausea and vomiting.  Musculoskeletal:       Negative for muscle weakness.  Psychiatric/Behavioral: Positive for depression. The patient has insomnia.     PHYSICAL EXAM: Pt in no acute distress  RECENT LABS AND TESTS: BMET    Component Value Date/Time   NA 143 04/16/2018 1229   NA 141 03/03/2016 1226   K 3.7 04/16/2018 1229   K 3.5 03/03/2016 1226   CL 104 04/16/2018 1229   CO2 21 04/16/2018 1229   CO2 24 03/03/2016 1226   GLUCOSE 183 (H) 04/16/2018 1229   GLUCOSE 242 (H) 03/22/2017 1220   GLUCOSE 113 03/03/2016 1226   BUN 12 04/16/2018 1229   BUN 8.5 03/03/2016 1226   CREATININE 0.89 04/16/2018 1229   CREATININE 0.8 03/03/2016 1226   CALCIUM 10.0 04/16/2018 1229   CALCIUM 10.0 03/03/2016 1226   GFRNONAA 70 04/16/2018 1229   GFRAA 81  04/16/2018 1229   Lab Results  Component Value Date   HGBA1C 9.0 (H) 04/16/2018   HGBA1C 8.2 06/20/2017   HGBA1C 15.5 02/09/2017   HGBA1C 11.1 (H) 10/26/2016   HGBA1C (H) 10/24/2008    7.2 (NOTE) The ADA recommends the following therapeutic goal for glycemic control related to Hgb A1c measurement: Goal of therapy: <6.5 Hgb A1c  Reference: American Diabetes Association: Clinical Practice Recommendations 2010, Diabetes Care, 2010, 33: (Suppl  1).   Lab Results  Component Value Date   INSULIN 8.7 04/16/2018   CBC    Component Value Date/Time   WBC 12.1 (H) 04/16/2018 1229   WBC 11.2 (H) 03/22/2017 1220   RBC 4.95 04/16/2018 1229   RBC 4.71 03/22/2017 1220   HGB 15.0 04/16/2018 1229   HGB 13.8 03/03/2016 1226   HCT 45.0 04/16/2018 1229   HCT 41.1 03/03/2016 1226   PLT 420 (H) 03/22/2017 1220   PLT 346 03/03/2016 1226   MCV 91 04/16/2018 1229   MCV 91.7 03/03/2016 1226   MCH 30.3 04/16/2018 1229   MCH 30.7 03/22/2017 1220   MCHC 33.3 04/16/2018 1229   MCHC 33.2 03/22/2017 1220   RDW 14.1 04/16/2018 1229   RDW 14.1 03/03/2016 1226   LYMPHSABS 3.4 (H) 04/16/2018 1229   LYMPHSABS 2.8 03/03/2016 1226   MONOABS 0.4 03/22/2017 1220   MONOABS 0.3 03/03/2016 1226   EOSABS 0.2 04/16/2018 1229   BASOSABS 0.1 04/16/2018 1229   BASOSABS 0.1 03/03/2016 1226   Iron/TIBC/Ferritin/ %Sat No results found for: IRON, TIBC, FERRITIN, IRONPCTSAT Lipid Panel     Component Value Date/Time   CHOL 216 (H) 04/16/2018 1229   TRIG 237 (H) 04/16/2018 1229   HDL 43 04/16/2018 1229   CHOLHDL 6 10/26/2016 0820   VLDL 49 (H) 10/24/2008 0940   LDLCALC 126 (H) 04/16/2018 1229   LDLDIRECT 95.0 10/26/2016 0820   Hepatic Function Panel     Component Value Date/Time   PROT 7.8 04/16/2018 1229   PROT 8.2 03/03/2016 1226   ALBUMIN 4.4 04/16/2018 1229   ALBUMIN 3.7 03/03/2016 1226   AST 27 04/16/2018 1229   AST 72 (H) 03/03/2016 1226  ALT 12 04/16/2018 1229   ALT 46 03/03/2016 1226   ALKPHOS  99 04/16/2018 1229   ALKPHOS 118 03/03/2016 1226   BILITOT 0.4 04/16/2018 1229   BILITOT 0.61 03/03/2016 1226    Results for MEL, TADROS (MRN [id number]) as of 07/25/2018 08:14  Ref. Range 04/16/2018 12:29  Vitamin D, 25-Hydroxy Latest Ref Range: 30.0 - 100.0 ng/mL 8.3 (L)     I, Marcille Blanco, CMA, am acting as transcriptionist for Starlyn Skeans, MD I have reviewed the above documentation for accuracy and completeness, and I agree with the above. -Dennard Nip, MD

## 2018-07-26 ENCOUNTER — Other Ambulatory Visit (INDEPENDENT_AMBULATORY_CARE_PROVIDER_SITE_OTHER): Payer: Self-pay | Admitting: Family Medicine

## 2018-07-26 DIAGNOSIS — E559 Vitamin D deficiency, unspecified: Secondary | ICD-10-CM

## 2018-07-26 DIAGNOSIS — F3289 Other specified depressive episodes: Secondary | ICD-10-CM

## 2018-08-02 ENCOUNTER — Other Ambulatory Visit: Payer: Self-pay | Admitting: Endocrinology

## 2018-08-07 ENCOUNTER — Ambulatory Visit (INDEPENDENT_AMBULATORY_CARE_PROVIDER_SITE_OTHER): Payer: BC Managed Care – PPO | Admitting: Family Medicine

## 2018-08-12 ENCOUNTER — Other Ambulatory Visit: Payer: Self-pay | Admitting: Endocrinology

## 2018-08-12 DIAGNOSIS — E782 Mixed hyperlipidemia: Secondary | ICD-10-CM

## 2018-08-12 DIAGNOSIS — E1165 Type 2 diabetes mellitus with hyperglycemia: Secondary | ICD-10-CM

## 2018-08-15 ENCOUNTER — Telehealth: Payer: Self-pay | Admitting: Endocrinology

## 2018-08-15 ENCOUNTER — Other Ambulatory Visit: Payer: Self-pay

## 2018-08-15 ENCOUNTER — Other Ambulatory Visit (INDEPENDENT_AMBULATORY_CARE_PROVIDER_SITE_OTHER): Payer: Self-pay | Admitting: Family Medicine

## 2018-08-15 DIAGNOSIS — E1165 Type 2 diabetes mellitus with hyperglycemia: Secondary | ICD-10-CM

## 2018-08-15 DIAGNOSIS — F3289 Other specified depressive episodes: Secondary | ICD-10-CM

## 2018-08-15 MED ORDER — SOLIQUA 100-33 UNT-MCG/ML ~~LOC~~ SOPN: 30.0000 [IU] | PEN_INJECTOR | Freq: Every day | SUBCUTANEOUS | 0 refills | Status: DC

## 2018-08-15 MED ORDER — JARDIANCE 25 MG PO TABS: 25.0000 mg | ORAL_TABLET | Freq: Every day | ORAL | 0 refills | Status: DC

## 2018-08-15 NOTE — Telephone Encounter (Signed)
E-Prescribing Status: Receipt confirmed by pharmacy (08/15/2018 2:16 PM EDT)

## 2018-08-15 NOTE — Telephone Encounter (Signed)
MEDICATION: Jardiance and Soliqua  PHARMACY:  [facility] [address] in [place] :   IS PATIENT OUT OF MEDICATION: yes  IF NOT; HOW MUCH IS LEFT: none  LAST APPOINTMENT DATE: @6 /01/2019  NEXT APPOINTMENT DATE:@7 /11/2018  DO WE HAVE YOUR PERMISSION TO LEAVE A DETAILED MESSAGE:  OTHER COMMENTS:    **Let patient know to contact pharmacy at the end of the day to make sure medication is ready. **  ** Please notify patient to allow 48-72 hours to process**  **Encourage patient to contact the pharmacy for refills or they can request refills through [facility]**

## 2018-08-16 ENCOUNTER — Other Ambulatory Visit: Payer: Self-pay

## 2018-08-19 ENCOUNTER — Other Ambulatory Visit (INDEPENDENT_AMBULATORY_CARE_PROVIDER_SITE_OTHER): Payer: Self-pay | Admitting: Family Medicine

## 2018-08-19 ENCOUNTER — Ambulatory Visit: Payer: Self-pay | Admitting: Endocrinology

## 2018-08-19 DIAGNOSIS — E559 Vitamin D deficiency, unspecified: Secondary | ICD-10-CM

## 2018-08-30 ENCOUNTER — Other Ambulatory Visit: Payer: Self-pay

## 2018-09-03 ENCOUNTER — Ambulatory Visit: Payer: Self-pay | Admitting: Endocrinology

## 2018-09-11 ENCOUNTER — Other Ambulatory Visit: Payer: Self-pay | Admitting: Endocrinology

## 2018-09-11 DIAGNOSIS — E1165 Type 2 diabetes mellitus with hyperglycemia: Secondary | ICD-10-CM

## 2018-09-13 ENCOUNTER — Other Ambulatory Visit (INDEPENDENT_AMBULATORY_CARE_PROVIDER_SITE_OTHER): Payer: BC Managed Care – PPO

## 2018-09-13 ENCOUNTER — Other Ambulatory Visit: Payer: Self-pay

## 2018-09-13 DIAGNOSIS — E1165 Type 2 diabetes mellitus with hyperglycemia: Secondary | ICD-10-CM | POA: Diagnosis not present

## 2018-09-13 DIAGNOSIS — E782 Mixed hyperlipidemia: Secondary | ICD-10-CM | POA: Diagnosis not present

## 2018-09-13 LAB — COMPREHENSIVE METABOLIC PANEL
ALT: 13 U/L (ref 0–35)
AST: 22 U/L (ref 0–37)
Albumin: 3.9 g/dL (ref 3.5–5.2)
Alkaline Phosphatase: 80 U/L (ref 39–117)
BUN: 14 mg/dL (ref 6–23)
CO2: 27 mEq/L (ref 19–32)
Calcium: 9.2 mg/dL (ref 8.4–10.5)
Chloride: 104 mEq/L (ref 96–112)
Creatinine, Ser: 0.95 mg/dL (ref 0.40–1.20)
GFR: 72.25 mL/min (ref >60.00)
Glucose, Bld: 167 mg/dL — ABNORMAL HIGH (ref 70–99)
Potassium: 3.7 mEq/L (ref 3.5–5.1)
Sodium: 139 mEq/L (ref 135–145)
Total Bilirubin: 0.3 mg/dL (ref 0.2–1.2)
Total Protein: 7.7 g/dL (ref 6.0–8.3)

## 2018-09-13 LAB — LIPID PANEL
Cholesterol: 173 mg/dL (ref 0–200)
HDL: 31.8 mg/dL — ABNORMAL LOW (ref >39.00)
NonHDL: 140.84
Total CHOL/HDL Ratio: 5
Triglycerides: 327 mg/dL — ABNORMAL HIGH (ref 0.0–149.0)
VLDL: 65.4 mg/dL — ABNORMAL HIGH (ref 0.0–40.0)

## 2018-09-13 LAB — LDL CHOLESTEROL, DIRECT: Direct LDL: 91 mg/dL

## 2018-09-13 LAB — HEMOGLOBIN A1C: Hgb A1c MFr Bld: 7.8 % — ABNORMAL HIGH (ref 4.6–6.5)

## 2018-09-17 ENCOUNTER — Other Ambulatory Visit: Payer: Self-pay

## 2018-09-17 ENCOUNTER — Ambulatory Visit (INDEPENDENT_AMBULATORY_CARE_PROVIDER_SITE_OTHER): Payer: BC Managed Care – PPO | Admitting: Endocrinology

## 2018-09-17 ENCOUNTER — Encounter: Payer: Self-pay | Admitting: Endocrinology

## 2018-09-17 DIAGNOSIS — E782 Mixed hyperlipidemia: Secondary | ICD-10-CM | POA: Diagnosis not present

## 2018-09-17 DIAGNOSIS — E1165 Type 2 diabetes mellitus with hyperglycemia: Secondary | ICD-10-CM

## 2018-09-17 MED ORDER — FENOFIBRATE 145 MG PO TABS: 145.0000 mg | ORAL_TABLET | Freq: Every day | ORAL | 3 refills | Status: DC

## 2018-09-17 NOTE — Progress Notes (Signed)
Patient ID: Monica Clark, female   DOB: 07/28/1955, 63 y.o.   MRN: [id number]           Reason for Appointment: Follow-up for Type 2 Diabetes  Referring physician: [name]  Today's office visit was provided via telemedicine using video technique The patient was explained the limitations of evaluation and management by telemedicine and the availability of in person appointments.  The patient understood the limitations and agreed to proceed. Patient also understood that the telehealth visit is billable. . Location of the patient: Patient's home . Location of the provider: Physician office Only the patient and myself were participating in the encounter     History of Present Illness:          Date of diagnosis of type 2 diabetes mellitus: 2010       Background history:   She is not clear when her diabetes for diagnosis but had a high A1c in 2010 She has been on various oral medications over the last few years including Januvia  Amaryl was probably started in 2015 in addition to metformin She thinks her control was good only in the first few years, A1c 2016 was 9.5 She was without medication for some time in December 2018 when A1c was 15+  A1c in December 2019 was 15.5 and usually over 10  Recent history:        Her A1c is relatively better at 8.2, previously and   Non-insulin hypoglycemic drugs: Metformin ER 3-4, Amaryl 4 mg daily,, Jardiance 25 mg daily SOLIQUA 35-40 units at bedtime  Current management, blood sugar patterns and problems identified:  Her A1c is now 7.8 compared to 9%  She was switched from [place] to [place] on her last visit on 06/21/2018  With this her blood sugars overall are better in the morning  However she is not adjusting her dose as directed for the [facility] and randomly taking between 30 and 40 units based on her sugar every couple of days  She has only one blood sugar below 120 in the mornings and the rest are fairly high and likely  averaging still about 170-180  She thinks she is trying to do a little walking up to 15 minutes  Usually has been eating low-fat meals and previously had been on a weight management program  She forgets to do her readings AFTER meals and not clear if they are high  Her main meal is at dinnertime  She has not been checking her weight  She says she has had some nausea and is not able to take the full dose of metformin and mostly taking 2 in the evening and 1 in the morning       Side effects from medications have been: Mild diarrhea from regular metformin  Compliance with the medical regimen: Fair Hypoglycemia: None    Glucose monitoring:  done 0-1  times a day         Glucometer:  One Touch Ultra  Blood Glucose readings by patient history  FASTING blood sugar range 108-217  Previous readings: FASTING blood sugar range 164-253, MEDIAN 219 Lunchtime 160, 252 1 AM 237  Self-care: The diet that the patient has been following is: tries to limit carbs.     Typical meal intake: Breakfast is Egg, fruit.  Lunch is chicken, vegetables, fruit and dinner is starch, meat and fruit.  Snacks: Granola bar and fruit  Dietician visit, most recent: 2013               Exercise: some walking recently    Weight history:209-229  Wt Readings from Last 3 Encounters:  04/30/18 214 lb (97.1 kg)  04/16/18 216 lb (98 kg)  07/10/17 206 lb (93.4 kg)    Glycemic control:   Lab Results  Component Value Date   HGBA1C 7.8 (H) 09/13/2018   HGBA1C 9.0 (H) 04/16/2018   HGBA1C 8.2 06/20/2017   Lab Results  Component Value Date   MICROALBUR 0.8 10/26/2016   LDLCALC 126 (H) 04/16/2018   CREATININE 0.95 09/13/2018     Lab Results  Component Value Date   MICRALBCREAT 1.5 10/26/2016       Allergies as of 09/17/2018      Reactions   Codeine Other (See Comments)   INSOMNIA      Medication List       Accurate as of September 17, 2018  3:18 PM. If you have any questions, ask your  nurse or doctor.        STOP taking these medications   topiramate 25 MG tablet Commonly known as: Topamax Stopped by: Elayne Snare, MD     TAKE these medications   glimepiride 4 MG tablet Commonly known as: AMARYL Take 4 mg by mouth daily with breakfast.   glucose blood test strip Commonly known as: ONE TOUCH TEST STRIPS Use as instructed one time a day   Insulin Pen Needle 31G X 5 MM Misc Use once a day   Jardiance 25 MG Tabs tablet Generic drug: empagliflozin Take 25 mg by mouth daily.   metFORMIN 500 MG 24 hr tablet Commonly known as: GLUCOPHAGE-XR Take 4 tablets (2,000 mg total) by mouth daily with supper.   Soliqua 100-33 UNT-MCG/ML Sopn Generic drug: Insulin Glargine-Lixisenatide INJECT 30 UNITS INTO THE SKIN DAILY.   tamoxifen 20 MG tablet Commonly known as: NOLVADEX Take 1 tablet (20 mg total) by mouth daily.   triamterene-hydrochlorothiazide 37.5-25 MG tablet Commonly known as: MAXZIDE-25 Take 1 tablet by mouth every morning. Reported on 09/03/2015   Vitamin D (Ergocalciferol) 1.25 MG (50000 UT) Caps capsule Commonly known as: DRISDOL Take 1 capsule (50,000 Units total) by mouth every 7 (seven) days.       Allergies:  Allergies  Allergen Reactions  . Codeine Other (See Comments)    INSOMNIA    Past Medical History:  Diagnosis Date  . Breast mass, left    duct mass  . Depression   . Endometrial hyperplasia   . Fuchs' corneal dystrophy   . Gallbladder problem   . Heart murmur    states no known problems, has never seen a cardiologist  . Hypertension    states under control with med., has been on med. x 5 yr.  . Immature cataract   . Lower extremity edema   . 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 Nipple discharge 04/2014   left breast  . Non-insulin dependent type 2 diabetes mellitus [facility])     Past Surgical History:  Procedure Laterality Date  . BREAST BIOPSY Left 04/30/2014   Procedure: LEFT BREAST BIOPSY;  Surgeon: Jackolyn Confer, MD;  Location: [place];  Service: General;  Laterality: Left;  . BREAST DUCTAL SYSTEM EXCISION Left 04/30/2014   Procedure: LEFT NIPPLE DUCT EXCISION;  Surgeon: Jackolyn Confer, MD;  Location: [place];  Service: General;  Laterality: Left;  . [place]  .  CHOLECYSTECTOMY  1989  . ROBOTIC ASSISTED TOTAL HYSTERECTOMY WITH BILATERAL SALPINGO OOPHERECTOMY Bilateral 10/13/2014   Procedure: ROBOTIC ASSISTED TOTAL HYSTERECTOMY WITH BILATERAL SALPINGO OOPHORECTOMY ;  Surgeon: Everitt Amber, MD;  Location: [facility];  Service: Gynecology;  Laterality: Bilateral;    Family History  Problem Relation Age of Onset  . Diabetes Mother   . Obesity Mother   . Heart disease Father   . Cancer Father        throat cancer   . Sudden death Father   . Alcoholism Father   . Diabetes Sister   . Diabetes Brother     Social History:  reports that she has never smoked. She has never used smokeless tobacco. She reports current alcohol use. She reports that she does not use drugs.   Review of Systems     Lipid history: LDL previously 95 and now 91  Not on any treatment from PCP, previously had taken pravastatin  Despite her sugars being little better and her diet being excellent her triglycerides are significantly high, has not been on any medications    Lab Results  Component Value Date   CHOL 173 09/13/2018   HDL 31.80 (L) 09/13/2018   LDLCALC 126 (H) 04/16/2018   LDLDIRECT 91.0 09/13/2018   TRIG 327.0 (H) 09/13/2018   CHOLHDL 5 09/13/2018           Hypertension: Currently treated with only Maxzide Also on Jardiance  Not on ACE inhibitor   Most recent foot exam: 7/17    LABS:  Lab on 09/13/2018  Component Date Value Ref Range Status  . Cholesterol 09/13/2018 173  0 - 200 mg/dL Final   ATP III Classification       Desirable:  < 200 mg/dL               Borderline High:  200 - 239 mg/dL          High:  > = 240 mg/dL  . Triglycerides  09/13/2018 327.0* 0.0 - 149.0 mg/dL Final   Normal:  <150 mg/dLBorderline High:  150 - 199 mg/dL  . HDL 09/13/2018 31.80* >39.00 mg/dL Final  . VLDL 09/13/2018 65.4* 0.0 - 40.0 mg/dL Final  . Total CHOL/HDL Ratio 09/13/2018 5   Final                  Men          Women1/2 Average Risk     3.4          3.3Average Risk          5.0          4.42X Average Risk          9.6          7.13X Average Risk          15.0          11.0                      . NonHDL 09/13/2018 140.84   Final   NOTE:  Non-HDL goal should be 30 mg/dL higher than patient's LDL goal (i.e. LDL goal of < 70 mg/dL, would have non-HDL goal of < 100 mg/dL)  . Sodium 09/13/2018 139  135 - 145 mEq/L Final  . Potassium 09/13/2018 3.7  3.5 - 5.1 mEq/L Final  . Chloride 09/13/2018 104  96 - 112 mEq/L Final  . CO2 09/13/2018 27  19 - 32 mEq/L Final  .  Glucose, Bld 09/13/2018 167* 70 - 99 mg/dL Final  . BUN 09/13/2018 14  6 - 23 mg/dL Final  . Creatinine, Ser 09/13/2018 0.95  0.40 - 1.20 mg/dL Final  . Total Bilirubin 09/13/2018 0.3  0.2 - 1.2 mg/dL Final  . Alkaline Phosphatase 09/13/2018 80  39 - 117 U/L Final  . AST 09/13/2018 22  0 - 37 U/L Final  . ALT 09/13/2018 13  0 - 35 U/L Final  . Total Protein 09/13/2018 7.7  6.0 - 8.3 g/dL Final  . Albumin 09/13/2018 3.9  3.5 - 5.2 g/dL Final  . Calcium 09/13/2018 9.2  8.4 - 10.5 mg/dL Final  . GFR 09/13/2018 72.25  >60.00 mL/min Final  . Hgb A1c MFr Bld 09/13/2018 7.8* 4.6 - 6.5 % Final   Glycemic Control Guidelines for People with Diabetes:Non Diabetic:  <6%Goal of Therapy: <7%Additional Action Suggested:  >8%   . Direct LDL 09/13/2018 91.0  mg/dL Final   Optimal:  <100 mg/dLNear or Above Optimal:  100-129 mg/dLBorderline High:  130-159 mg/dLHigh:  160-189 mg/dLVery High:  >190 mg/dL    Physical Examination:  There were no vitals taken for this visit.         ASSESSMENT:  Diabetes type 2, uncontrolled  See history of present illness for detailed discussion of current  diabetes management, blood sugar patterns and problems identified  She has had significant improvement in her blood sugar control now with using basal insulin in addition to her other medications Previously has had persistently poor control of diabetes   Although not clear if the insulin is making her weight go up she is usually trying to watch her diet and recently trying to do a little walking also Blood sugars were reviewed only for the last week because of her being a telehealth patient However she has not done readings after meals Although she was supposed to take [place] in the morning she is taking this at night after supper and not clear if this is effective for postprandial hyperglycemia with this regimen  Blood sugars are averaging just below 200 but likely needs higher doses of Soliqua than what she has been taking, currently on 30-35 usually  HYPERTENSION: Blood pressure has been previously controlled with Maxide along with Jardiance Renal function normal  LIPIDS: Has significantly high triglycerides even with improved blood sugar control  LDL below 100   PLAN:    She will need to increase her Soliqua at least to 38 units  She will take this before dinnertime  Advised her to increase that every 3 to 4 days to try and get her morning sugar at least under 140 consistently and make changes by at least 2 units at a time  No change in other medications  Start in addition to doing fasting do some postprandial readings randomly at various times especially after supper  Increase frequency of exercise as tolerated  FENOFIBRATE 145 mg daily  Follow-up in 6 weeks with fructosamine and fasting lipids    There are no Patient Instructions on file for this visit.  Counseling time on subjects discussed in assessment and plan sections is over 50% of today's 25 minute visit  Elayne Snare 09/17/2018, 3:18 PM   Note: This office note was prepared with Dragon voice recognition  system technology. Any transcriptional errors that result from this process are unintentional.

## 2018-09-30 ENCOUNTER — Telehealth: Payer: Self-pay | Admitting: Hematology

## 2018-09-30 NOTE — Telephone Encounter (Signed)
[facility] moved appointments from 8/24 to 8/31. Left message. Schedule mailed.

## 2018-10-03 ENCOUNTER — Other Ambulatory Visit: Payer: Self-pay | Admitting: Endocrinology

## 2018-10-03 DIAGNOSIS — E1165 Type 2 diabetes mellitus with hyperglycemia: Secondary | ICD-10-CM

## 2018-10-14 ENCOUNTER — Ambulatory Visit: Payer: Self-pay | Admitting: Hematology

## 2018-10-14 ENCOUNTER — Other Ambulatory Visit: Payer: Self-pay

## 2018-10-18 NOTE — Progress Notes (Signed)
[place]   Telephone:([phone] [phone]   Clinic Follow up Note   Patient Care Team: [name], MD as PCP - General (Family Medicine) Everitt Amber, MD as Consulting Physician (Obstetrics and Gynecology)  Date of Service:  10/21/2018  CHIEF COMPLAINT: Follow up left breast atypical ductal hyperplasia   CURRENT THERAPY:  Tamoxifen 20 mg daily, started on 11/30/2014. Plan to complete in 11/2019  INTERVAL HISTORY:  Monica Clark is here for a follow up left breast ADH. She presents to the clinic alone. She notes she is doing well. She notes she is dealing with hot flashes an dry skin from Tamoxifen. She notes occasional joint pain but not bad. She notes lingering cramps of her legs at night. She denies any LE edema.  She notes her weight fluctuates and feels this has increased overall with tamoxifen. She has started going [facility] since 3 months ago. She has not had a DEXA scan before. She notes she does not have adequate time sleeping at night. Although she will be tired during the day she does not take naps. Her PCP recommended her to proceed with sleep apnea test. She will consider it.  She notes she goes into her job 3 days a week now.    REVIEW OF SYSTEMS:   Constitutional: Denies fevers, chills (+) weight fluctuating (+) Hot Flashes (+) Insomnia  Eyes: Denies blurriness of vision Ears, nose, mouth, throat, and face: Denies mucositis or sore throat Respiratory: Denies cough, dyspnea or wheezes Cardiovascular: Denies palpitation, chest discomfort or lower extremity swelling Gastrointestinal:  Denies nausea, heartburn or change in bowel habits Skin: Denies abnormal skin rashes  (+) Dry skin  Lymphatics: Denies new lymphadenopathy or easy bruising Neurological:Denies numbness, tingling or new weaknesses Behavioral/Psych: Mood is stable, no new changes  All other systems were reviewed with the patient and are negative.   MEDICAL HISTORY:  Past Medical History:  Diagnosis Date  . Breast mass, left    duct mass  . Depression   . Endometrial hyperplasia   . Fuchs' corneal dystrophy   . Gallbladder problem   . Heart murmur    states no known problems, has never seen a cardiologist  . Hypertension    states under control with med., has been on med. x 5 yr.  . Immature cataract   . Lower extremity edema   . 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 Nipple discharge 04/2014   left breast  . Non-insulin dependent type 2 diabetes mellitus ([place])     SURGICAL HISTORY: Past Surgical History:  Procedure Laterality Date  . BREAST BIOPSY Left 04/30/2014   Procedure: LEFT BREAST BIOPSY;  Surgeon: Jackolyn Confer, MD;  Location: [facility];  Service: General;  Laterality: Left;  . BREAST DUCTAL SYSTEM EXCISION Left 04/30/2014   Procedure: LEFT NIPPLE DUCT EXCISION;  Surgeon: Jackolyn Confer, MD;  Location: [place];  Service: General;  Laterality: Left;  . [place]  . CHOLECYSTECTOMY  1989  . ROBOTIC ASSISTED TOTAL HYSTERECTOMY WITH BILATERAL SALPINGO OOPHERECTOMY Bilateral 10/13/2014   Procedure: ROBOTIC ASSISTED TOTAL HYSTERECTOMY WITH BILATERAL SALPINGO OOPHORECTOMY ;  Surgeon: Everitt Amber, MD;  Location: [facility];  Service: Gynecology;  Laterality: Bilateral;    I have reviewed the social history and family history with the patient and they are unchanged from previous note.  ALLERGIES:  is allergic to codeine.  MEDICATIONS:  Current Outpatient Medications  Medication Sig Dispense  Refill  . empagliflozin (JARDIANCE) 25 MG TABS tablet Take 1 tablet by mouth once daily. DX:E11.65 30 tablet 0  . fenofibrate (TRICOR) 145 MG tablet Take 1 tablet (145 mg total) by mouth daily. 30 tablet 3  . glimepiride (AMARYL) 4 MG tablet Take 4 mg by mouth daily with breakfast.    . glucose blood (ONE TOUCH TEST STRIPS) test strip Use as instructed one time a day 100 each 12  .  Insulin Pen Needle 31G X 5 MM MISC Use once a day 100 each 1  . metFORMIN (GLUCOPHAGE-XR) 500 MG 24 hr tablet Take 4 tablets (2,000 mg total) by mouth daily with supper. 120 tablet 3  . SOLIQUA 100-33 UNT-MCG/ML SOPN INJECT 30 UNITS INTO THE SKIN DAILY. 9 pen 3  . tamoxifen (NOLVADEX) 20 MG tablet Take 1 tablet (20 mg total) by mouth daily. 90 tablet 3  . triamterene-hydrochlorothiazide (MAXZIDE-25) 37.5-25 MG per tablet Take 1 tablet by mouth every morning. Reported on 09/03/2015    . Vitamin D, Ergocalciferol, (DRISDOL) 1.25 MG (50000 UT) CAPS capsule Take 1 capsule (50,000 Units total) by mouth every 7 (seven) days. 4 capsule 0   No current facility-administered medications for this visit.     PHYSICAL EXAMINATION: ECOG PERFORMANCE STATUS: 1 - Symptomatic but completely ambulatory  Vitals:   10/21/18 1352  BP: (!) 156/80  Pulse: 62  Resp: (!) 24  Temp: 98.3 F (36.8 C)  SpO2: 99%   Filed Weights   10/21/18 1352  Weight: 223 lb 4.8 oz (101.3 kg)    GENERAL:alert, no distress and comfortable SKIN: skin color, texture, turgor are normal, no rashes or significant lesions EYES: normal, Conjunctiva are pink and non-injected, sclera clear  NECK: supple, thyroid normal size, non-tender, without nodularity LYMPH:  no palpable lymphadenopathy in the cervical, axillary  LUNGS: clear to auscultation and percussion with normal breathing effort HEART: regular rate & rhythm and no murmurs and no lower extremity edema ABDOMEN:abdomen soft, non-tender and normal bowel sounds Musculoskeletal:no cyanosis of digits and no clubbing  NEURO: alert & oriented x 3 with fluent speech, no focal motor/sensory deficits BREAST: S/p Left nipple duct excision. No palpable mass, nodules or adenopathy bilaterally. Breast exam benign.   LABORATORY DATA:  I have reviewed the data as listed CBC Latest Ref Rng & Units 10/21/2018 04/16/2018 03/22/2017  WBC 4.0 - 10.5 K/uL 12.6(H) 12.1(H) 11.2(H)  Hemoglobin 12.0  - 15.0 g/dL 13.4 15.0 14.5  Hematocrit 36.0 - 46.0 % 41.7 45.0 43.5  Platelets 150 - 400 K/uL 346 - 420(H)     CMP Latest Ref Rng & Units 10/21/2018 09/13/2018 04/16/2018  Glucose 70 - 99 mg/dL 80 167(H) 183(H)  BUN 8 - 23 mg/dL 12 14 12   Creatinine 0.44 - 1.00 mg/dL 0.85 0.95 0.89  Sodium 135 - 145 mmol/L 138 139 143  Potassium 3.5 - 5.1 mmol/L 3.6 3.7 3.7  Chloride 98 - 111 mmol/L 105 104 104  CO2 22 - 32 mmol/L 24 27 21   Calcium 8.9 - 10.3 mg/dL 9.0 9.2 10.0  Total Protein 6.5 - 8.1 g/dL 7.6 7.7 7.8  Total Bilirubin 0.3 - 1.2 mg/dL 0.2(L) 0.3 0.4  Alkaline Phos 38 - 126 U/L 75 80 99  AST 15 - 41 U/L 21 22 27   ALT 0 - 44 U/L 15 13 12     RADIOGRAPHIC STUDIES: I have personally reviewed the radiological images as listed and agreed with the findings in the report. No results found.   ASSESSMENT &  PLAN:  HODA POSTIER is a 63 y.o. female with   1.left breast atypical lobular hyperplasia -She was diagnosed in 04/30/2014 with ADH when she underwent left nipple duct excision and surgical biopsy.  -She is on chemoprevention with tamoxifensince 11/30/14, tolerating moderate well with hot flashes and dry skin. We'll continue for total 5 years to complete in 11/2019.  -She is clinically doing well. Lab reviewed, her CBC and CMP are within normal limits except WBC 12.6, Lymphocytes 4.3. Her physical exam and her 04/2018 mammogram were unremarkable. There is no clinical concern for breast cancer.  -Continue Surveillance, next mammogram in 04/2019.  -She recently started [facility]. Her weight still currently fluctuates. I encouraged her to continue to monitor her diet and exercise.  -Continue Tamoxifen. She will complete 5 year therapy in 11/2019 -F/u 1 year   2. Stage I endometrial cancer, 2016 -Sheunderwent total hysterectomy with BSO on 10/13/14 by Dr.Rossi -She continues to be followed by her GYN.  -Given no other family history of malignancy, she  does notmeet the criteria for genetic testing(I previously spoke with our genetic counselor)  3. HTN, DM -She'll continue follow-up with her primary care physician -Her diabetes has gotten worse since she started tamoxifen, possible related, I encouraged her follow-up with her endocrinologist, eat healthy and exercise regularly.  4. Transaminitis  -She had a CT AP in 11/30/16 which showed evidence of diffuse fatty infiltration of the liver. -She is currently participating in [facility]. I encouraged her to continue.  -LFTs normal today   5. Hot flashes, Dry skin -Secondary to Tamoxifen  -currently manageable. I offered her medication to help and she declined for now.  -Mostly manageable.   6. Insomnia  -She notes she does not sleep adequately at night  -She does get tired during the day but does not take a nap.  -Her PCP recommended she get tested for Sleep apnea. I encouraged her to proceed with testing.   7. Bone Health  -She has not had a DEXA scan before  -I discussed in postmenopausal women bone density can decrease.  -She is on Tamoxifen which strengthen her bones but will complete next year. Will obtain baseline DEXA in 2021 and monitor every 2 years.   Plan -She is clinically doing well  -Continue Tamoxifen -Mammogram and DEXA in 04/2019 -Lab and f/u with NP Lacie in 1 year   No problem-specific Assessment & Plan notes found for this encounter.   Orders Placed This Encounter  Procedures  . MM Digital Screening    Standing Status:   Future    Standing Expiration Date:   10/21/2019    Scheduling Instructions:     [facility]    Order Specific Question:   Reason for Exam (SYMPTOM  OR DIAGNOSIS REQUIRED)    Answer:   screening    Order Specific Question:   Preferred imaging location?    Answer:   External  . DG Bone Density    Standing Status:   Future    Standing Expiration Date:   10/21/2019    Scheduling Instructions:     [facility]     Order Specific Question:   Reason for Exam (SYMPTOM  OR DIAGNOSIS REQUIRED)    Answer:   screening    Order Specific Question:   Preferred imaging location?    Answer:   External   All questions were answered. The patient knows to call the clinic with any problems,  questions or concerns. No barriers to learning was detected. I spent 15 minutes counseling the patient face to face. The total time spent in the appointment was 20 minutes and more than 50% was on counseling and review of test results     Truitt Merle, MD 10/21/2018   I, Joslyn Devon, am acting as scribe for Truitt Merle, MD.   I have reviewed the above documentation for accuracy and completeness, and I agree with the above.

## 2018-10-21 ENCOUNTER — Other Ambulatory Visit: Payer: Self-pay

## 2018-10-21 ENCOUNTER — Encounter: Payer: Self-pay | Admitting: Hematology

## 2018-10-21 ENCOUNTER — Telehealth: Payer: Self-pay | Admitting: Hematology

## 2018-10-21 ENCOUNTER — Inpatient Hospital Stay: Payer: BC Managed Care – PPO | Attending: Hematology

## 2018-10-21 ENCOUNTER — Inpatient Hospital Stay: Payer: BC Managed Care – PPO | Admitting: Hematology

## 2018-10-21 VITALS — BP 156/80 | HR 62 | Temp 98.3°F | Resp 24 | Ht 63.0 in | Wt 223.3 lb

## 2018-10-21 DIAGNOSIS — Z79810 Long term (current) use of selective estrogen receptor modulators (SERMs): Secondary | ICD-10-CM | POA: Diagnosis not present

## 2018-10-21 DIAGNOSIS — Z1231 Encounter for screening mammogram for malignant neoplasm of breast: Secondary | ICD-10-CM

## 2018-10-21 DIAGNOSIS — N6092 Unspecified benign mammary dysplasia of left breast: Secondary | ICD-10-CM | POA: Insufficient documentation

## 2018-10-21 DIAGNOSIS — Z8542 Personal history of malignant neoplasm of other parts of uterus: Secondary | ICD-10-CM | POA: Insufficient documentation

## 2018-10-21 DIAGNOSIS — E2839 Other primary ovarian failure: Secondary | ICD-10-CM | POA: Diagnosis not present

## 2018-10-21 DIAGNOSIS — G4700 Insomnia, unspecified: Secondary | ICD-10-CM | POA: Insufficient documentation

## 2018-10-21 DIAGNOSIS — R232 Flushing: Secondary | ICD-10-CM | POA: Diagnosis not present

## 2018-10-21 DIAGNOSIS — I10 Essential (primary) hypertension: Secondary | ICD-10-CM | POA: Insufficient documentation

## 2018-10-21 DIAGNOSIS — E119 Type 2 diabetes mellitus without complications: Secondary | ICD-10-CM | POA: Insufficient documentation

## 2018-10-21 DIAGNOSIS — N6099 Unspecified benign mammary dysplasia of unspecified breast: Secondary | ICD-10-CM

## 2018-10-21 LAB — CBC WITH DIFFERENTIAL/PLATELET
Abs Immature Granulocytes: 0.05 10*3/uL (ref 0.00–0.07)
Basophils Absolute: 0.1 10*3/uL (ref 0.0–0.1)
Basophils Relative: 1 %
Eosinophils Absolute: 0.2 10*3/uL (ref 0.0–0.5)
Eosinophils Relative: 1 %
HCT: 41.7 % (ref 36.0–46.0)
Hemoglobin: 13.4 g/dL (ref 12.0–15.0)
Immature Granulocytes: 0 %
Lymphocytes Relative: 34 %
Lymphs Abs: 4.3 10*3/uL — ABNORMAL HIGH (ref 0.7–4.0)
MCH: 30 pg (ref 26.0–34.0)
MCHC: 32.1 g/dL (ref 30.0–36.0)
MCV: 93.5 fL (ref 80.0–100.0)
Monocytes Absolute: 0.5 10*3/uL (ref 0.1–1.0)
Monocytes Relative: 4 %
Neutro Abs: 7.5 10*3/uL (ref 1.7–7.7)
Neutrophils Relative %: 60 %
Platelets: 346 10*3/uL (ref 150–400)
RBC: 4.46 MIL/uL (ref 3.87–5.11)
RDW: 13.7 % (ref 11.5–15.5)
WBC: 12.6 10*3/uL — ABNORMAL HIGH (ref 4.0–10.5)
nRBC: 0 % (ref 0.0–0.2)

## 2018-10-21 LAB — COMPREHENSIVE METABOLIC PANEL
ALT: 15 U/L (ref 0–44)
AST: 21 U/L (ref 15–41)
Albumin: 3.5 g/dL (ref 3.5–5.0)
Alkaline Phosphatase: 75 U/L (ref 38–126)
Anion gap: 9 (ref 5–15)
BUN: 12 mg/dL (ref 8–23)
CO2: 24 mmol/L (ref 22–32)
Calcium: 9 mg/dL (ref 8.9–10.3)
Chloride: 105 mmol/L (ref 98–111)
Creatinine, Ser: 0.85 mg/dL (ref 0.44–1.00)
GFR calc Af Amer: >60 mL/min (ref >60)
GFR calc non Af Amer: >60 mL/min (ref >60)
Glucose, Bld: 80 mg/dL (ref 70–99)
Potassium: 3.6 mmol/L (ref 3.5–5.1)
Sodium: 138 mmol/L (ref 135–145)
Total Bilirubin: 0.2 mg/dL — ABNORMAL LOW (ref 0.3–1.2)
Total Protein: 7.6 g/dL (ref 6.5–8.1)

## 2018-10-21 NOTE — Telephone Encounter (Signed)
Scheduled appt per 8/31 los.  Spoke with patient and scheduled appt with patient.  Patient aware of her appt date and time.

## 2018-10-26 ENCOUNTER — Other Ambulatory Visit: Payer: Self-pay | Admitting: Endocrinology

## 2018-10-26 DIAGNOSIS — E1165 Type 2 diabetes mellitus with hyperglycemia: Secondary | ICD-10-CM

## 2018-11-04 ENCOUNTER — Encounter (INDEPENDENT_AMBULATORY_CARE_PROVIDER_SITE_OTHER): Payer: Self-pay | Admitting: Family Medicine

## 2018-11-04 ENCOUNTER — Other Ambulatory Visit: Payer: Self-pay

## 2018-11-04 ENCOUNTER — Ambulatory Visit (INDEPENDENT_AMBULATORY_CARE_PROVIDER_SITE_OTHER): Payer: BC Managed Care – PPO | Admitting: Family Medicine

## 2018-11-04 VITALS — BP 122/76 | HR 79 | Temp 98.6°F | Ht 63.0 in | Wt 216.0 lb

## 2018-11-04 DIAGNOSIS — Z9189 Other specified personal risk factors, not elsewhere classified: Secondary | ICD-10-CM

## 2018-11-04 DIAGNOSIS — E559 Vitamin D deficiency, unspecified: Secondary | ICD-10-CM

## 2018-11-04 DIAGNOSIS — E119 Type 2 diabetes mellitus without complications: Secondary | ICD-10-CM | POA: Diagnosis not present

## 2018-11-04 DIAGNOSIS — Z6838 Body mass index (BMI) 38.0-38.9, adult: Secondary | ICD-10-CM

## 2018-11-04 DIAGNOSIS — Z794 Long term (current) use of insulin: Secondary | ICD-10-CM

## 2018-11-04 MED ORDER — VITAMIN D (ERGOCALCIFEROL) 1.25 MG (50000 UNIT) PO CAPS: 50000.0000 [IU] | ORAL_CAPSULE | ORAL | 0 refills | Status: DC

## 2018-11-04 MED ORDER — RYBELSUS 3 MG PO TABS: 1.0000 | ORAL_TABLET | Freq: Every day | ORAL | 0 refills | Status: DC

## 2018-11-04 MED ORDER — GLUCOSE BLOOD VI STRP: ORAL_STRIP | 0 refills | Status: DC

## 2018-11-05 NOTE — Progress Notes (Signed)
Office: [phone]  /  Fax: [phone]   HPI:   Chief Complaint: OBESITY Monica Clark is here to discuss her progress with her obesity treatment plan. She is on the  follow the Category 2 plan and is following her eating plan approximately 0 % of the time. She states she is exercising 0 minutes 0 times per week. Monica Clark last in office visit was 6 months ago. She has done well minimizing weight gain during COVID-19 isolation but is ready to get back to weight loss.  Her weight is 216 lb (98 kg) today and has had a weight gain of 2 pounds over a period of 6 months since her last in office visit. She has lost 0 lbs since starting treatment with [place].  Diabetes II Monica Clark has a diagnosis of diabetes type II. Monica Clark states she does not check BG at home due to recently out of test strips and denies any hypoglycemic episodes. Last A1c was 7.8 and it is slowly improving but not yet at goal. She has been working on intensive lifestyle modifications including diet, exercise, and weight loss to help control her blood glucose levels. She will change to low carbohydrate plan.   Vitamin D deficiency Monica Clark has a diagnosis of vitamin D deficiency. She is currently taking vit D and denies nausea, vomiting or muscle weakness.  At risk for cardiovascular disease Monica Clark is at a higher than average risk for cardiovascular disease due to obesity. She currently denies any chest pain.  ASSESSMENT AND PLAN:  Vitamin D deficiency - Plan: Vitamin D, Ergocalciferol, (DRISDOL) 1.25 MG (50000 UT) CAPS capsule  Type 2 diabetes mellitus without complication, with long-term current use of insulin (Monica Clark) - Plan: glucose blood (ONE TOUCH TEST STRIPS) test strip  At risk for heart disease  Class 2 severe obesity with serious comorbidity and body mass index (BMI) of 38.0 to 38.9 in adult, unspecified obesity type ([place])  PLAN: Diabetes II Monica Clark has been given extensive diabetes education by myself today including ideal fasting and  post-prandial blood glucose readings, individual ideal HgA1c goals  and hypoglycemia prevention. We discussed the importance of good blood sugar control to decrease the likelihood of diabetic complications such as nephropathy, neuropathy, limb loss, blindness, coronary artery disease, and death. We discussed the importance of intensive lifestyle modification including diet, exercise and weight loss as the first line treatment for diabetes. Monica Clark agrees to continue her diabetes medications and we will refill one touch strips #100 with no refills and will check BGs at least 2 times a day. She agrees to start Rybelsus 3 mg qAM #30 with no refills and will follow up at the agreed upon time.  Vitamin D Deficiency Monica Clark was informed that low vitamin D levels contributes to fatigue and are associated with obesity, breast, and colon cancer. She agrees to continue to take prescription Vit D @50 ,000 IU every week #4 with no refills and will follow up for routine testing of vitamin D, at least 2-3 times per year. She was informed of the risk of over-replacement of vitamin D and agrees to not increase her dose unless she discusses this with [place] first. Agrees to follow up with our clinic as directed.    Cardiovascular risk counseling Monica Clark was given extended (15 minutes) coronary artery disease prevention counseling today. She is 63 y.o. female and has risk factors for heart disease including obesity. We discussed intensive lifestyle modifications today with an emphasis on specific weight loss instructions and strategies. Pt was also informed  of the importance of increasing exercise and decreasing saturated fats to help prevent heart disease.  Obesity Monica Clark is currently in the action stage of change. As such, her goal is to continue with weight loss efforts She has agreed to follow a lower carbohydrate, vegetable and lean protein rich diet plan Monica Clark has been instructed to work up to a goal of 150 minutes of combined  cardio and strengthening exercise per week for weight loss and overall health benefits. We discussed the following Behavioral Modification Strategies today: work on meal planning and easy cooking plans   Monica Clark has agreed to follow up with our clinic in 2 weeks. She was informed of the importance of frequent follow up visits to maximize her success with intensive lifestyle modifications for her multiple health conditions.  ALLERGIES: Allergies  Allergen Reactions  . Codeine Other (See Comments)    INSOMNIA    MEDICATIONS: Current Outpatient Medications on File Prior to Visit  Medication Sig Dispense Refill  . fenofibrate (TRICOR) 145 MG tablet Take 1 tablet (145 mg total) by mouth daily. 30 tablet 3  . glimepiride (AMARYL) 4 MG tablet Take 4 mg by mouth daily with breakfast.    . Insulin Pen Needle 31G X 5 MM MISC Use once a day 100 each 1  . JARDIANCE 25 MG TABS tablet TAKE 1 TABLET BY MOUTH ONCE DAILY. DX:E11.65 30 tablet 1  . metFORMIN (GLUCOPHAGE-XR) 500 MG 24 hr tablet Take 4 tablets (2,000 mg total) by mouth daily with supper. 120 tablet 3  . SOLIQUA 100-33 UNT-MCG/ML SOPN INJECT 30 UNITS INTO THE SKIN DAILY. 9 pen 3  . tamoxifen (NOLVADEX) 20 MG tablet Take 1 tablet (20 mg total) by mouth daily. 90 tablet 3  . triamterene-hydrochlorothiazide (MAXZIDE-25) 37.5-25 MG per tablet Take 1 tablet by mouth every morning. Reported on 09/03/2015     No current facility-administered medications on file prior to visit.     PAST MEDICAL HISTORY: Past Medical History:  Diagnosis Date  . Breast mass, left    duct mass  . Depression   . Endometrial hyperplasia   . Fuchs' corneal dystrophy   . Gallbladder problem   . Heart murmur    states no known problems, has never seen a cardiologist  . Hypertension    states under control with med., has been on med. x 5 yr.  . Immature cataract   . Lower extremity edema   . 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 Nipple discharge 04/2014    left breast  . Non-insulin dependent type 2 diabetes mellitus ([place])     PAST SURGICAL HISTORY: Past Surgical History:  Procedure Laterality Date  . BREAST BIOPSY Left 04/30/2014   Procedure: LEFT BREAST BIOPSY;  Surgeon: Jackolyn Confer, MD;  Location: [facility];  Service: General;  Laterality: Left;  . BREAST DUCTAL SYSTEM EXCISION Left 04/30/2014   Procedure: LEFT NIPPLE DUCT EXCISION;  Surgeon: Jackolyn Confer, MD;  Location: [place];  Service: General;  Laterality: Left;  . [place]  . CHOLECYSTECTOMY  1989  . ROBOTIC ASSISTED TOTAL HYSTERECTOMY WITH BILATERAL SALPINGO OOPHERECTOMY Bilateral 10/13/2014   Procedure: ROBOTIC ASSISTED TOTAL HYSTERECTOMY WITH BILATERAL SALPINGO OOPHORECTOMY ;  Surgeon: Everitt Amber, MD;  Location: [facility];  Service: Gynecology;  Laterality: Bilateral;    SOCIAL HISTORY: Social History   Tobacco Use  . Smoking status: Never Smoker  . Smokeless tobacco: Never Used  Substance Use Topics  . Alcohol use: Yes  Comment: rarely  . Drug use: No    FAMILY HISTORY: Family History  Problem Relation Age of Onset  . Diabetes Mother   . Obesity Mother   . Heart disease Father   . Cancer Father        throat cancer   . Sudden death Father   . Alcoholism Father   . Diabetes Sister   . Diabetes Brother     ROS: Review of Systems  Constitutional: Negative for weight loss.  Cardiovascular: Negative for chest pain.  Gastrointestinal: Negative for nausea and vomiting.  Musculoskeletal:       Negative for muscle weakness  Endo/Heme/Allergies:       Negative for hypoglycemia     PHYSICAL EXAM: Blood pressure 122/76, pulse 79, temperature 98.6 F (37 C), temperature source Oral, height 5\' 3"  (1.6 m), weight 216 lb (98 kg), SpO2 98 %. Body mass index is 38.26 kg/m. Physical Exam Vitals signs reviewed.  Constitutional:      Appearance: Normal appearance. She is obese.  HENT:     Head: Normocephalic.      Nose: Nose normal.  Neck:     Musculoskeletal: Normal range of motion.  Cardiovascular:     Rate and Rhythm: Normal rate.  Pulmonary:     Effort: Pulmonary effort is normal.  Musculoskeletal: Normal range of motion.  Skin:    General: Skin is warm and dry.  Neurological:     Mental Status: She is alert and oriented to person, place, and time.  Psychiatric:        Mood and Affect: Mood normal.        Behavior: Behavior normal.     RECENT LABS AND TESTS: BMET    Component Value Date/Time   NA 138 10/21/2018 1338   NA 143 04/16/2018 1229   NA 141 03/03/2016 1226   K 3.6 10/21/2018 1338   K 3.5 03/03/2016 1226   CL 105 10/21/2018 1338   CO2 24 10/21/2018 1338   CO2 24 03/03/2016 1226   GLUCOSE 80 10/21/2018 1338   GLUCOSE 113 03/03/2016 1226   BUN 12 10/21/2018 1338   BUN 12 04/16/2018 1229   BUN 8.5 03/03/2016 1226   CREATININE 0.85 10/21/2018 1338   CREATININE 0.8 03/03/2016 1226   CALCIUM 9.0 10/21/2018 1338   CALCIUM 10.0 03/03/2016 1226   GFRNONAA >60 10/21/2018 1338   GFRAA >60 10/21/2018 1338   Lab Results  Component Value Date   HGBA1C 7.8 (H) 09/13/2018   HGBA1C 9.0 (H) 04/16/2018   HGBA1C 8.2 06/20/2017   HGBA1C 15.5 02/09/2017   HGBA1C 11.1 (H) 10/26/2016   Lab Results  Component Value Date   INSULIN 8.7 04/16/2018   CBC    Component Value Date/Time   WBC 12.6 (H) 10/21/2018 1338   RBC 4.46 10/21/2018 1338   HGB 13.4 10/21/2018 1338   HGB 15.0 04/16/2018 1229   HGB 13.8 03/03/2016 1226   HCT 41.7 10/21/2018 1338   HCT 45.0 04/16/2018 1229   HCT 41.1 03/03/2016 1226   PLT 346 10/21/2018 1338   PLT 346 03/03/2016 1226   MCV 93.5 10/21/2018 1338   MCV 91 04/16/2018 1229   MCV 91.7 03/03/2016 1226   MCH 30.0 10/21/2018 1338   MCHC 32.1 10/21/2018 1338   RDW 13.7 10/21/2018 1338   RDW 14.1 04/16/2018 1229   RDW 14.1 03/03/2016 1226   LYMPHSABS 4.3 (H) 10/21/2018 1338   LYMPHSABS 3.4 (H) 04/16/2018 1229   LYMPHSABS 2.8 03/03/2016 1226  MONOABS 0.5 10/21/2018 1338   MONOABS 0.3 03/03/2016 1226   EOSABS 0.2 10/21/2018 1338   EOSABS 0.2 04/16/2018 1229   BASOSABS 0.1 10/21/2018 1338   BASOSABS 0.1 04/16/2018 1229   BASOSABS 0.1 03/03/2016 1226   Iron/TIBC/Ferritin/ %Sat No results found for: IRON, TIBC, FERRITIN, IRONPCTSAT Lipid Panel     Component Value Date/Time   CHOL 173 09/13/2018 0906   CHOL 216 (H) 04/16/2018 1229   TRIG 327.0 (H) 09/13/2018 0906   HDL 31.80 (L) 09/13/2018 0906   HDL 43 04/16/2018 1229   CHOLHDL 5 09/13/2018 0906   VLDL 65.4 (H) 09/13/2018 0906   LDLCALC 126 (H) 04/16/2018 1229   LDLDIRECT 91.0 09/13/2018 0906   Hepatic Function Panel     Component Value Date/Time   PROT 7.6 10/21/2018 1338   PROT 7.8 04/16/2018 1229   PROT 8.2 03/03/2016 1226   ALBUMIN 3.5 10/21/2018 1338   ALBUMIN 4.4 04/16/2018 1229   ALBUMIN 3.7 03/03/2016 1226   AST 21 10/21/2018 1338   AST 72 (H) 03/03/2016 1226   ALT 15 10/21/2018 1338   ALT 46 03/03/2016 1226   ALKPHOS 75 10/21/2018 1338   ALKPHOS 118 03/03/2016 1226   BILITOT 0.2 (L) 10/21/2018 1338   BILITOT 0.4 04/16/2018 1229   BILITOT 0.61 03/03/2016 1226      Component Value Date/Time   TSH 2.220 04/16/2018 1229   TSH 1.629 Test methodology is 3rd generation TSH 10/24/2008 0457     Ref. Range 04/16/2018 12:29  Vitamin D, 25-Hydroxy Latest Ref Range: 30.0 - 100.0 ng/mL 8.3 (L)     OBESITY BEHAVIORAL INTERVENTION VISIT  Today's visit was # 7   Starting weight: 216 lbs Starting date: 04/16/18 Today's weight : Weight: 216 lb (98 kg)  Today's date: 11/04/18 Total lbs lost to date: 0 At least 15 minutes were spent on discussing the following behavioral intervention visit.   ASK: We discussed the diagnosis of obesity with Monica Clark today and Monica Clark agreed to give [place] permission to discuss obesity behavioral modification therapy today.  ASSESS: Monica Clark has the diagnosis of obesity and her BMI today is 38.27 Monica Clark is in the action stage  of change   ADVISE: Monica Clark was educated on the multiple health risks of obesity as well as the benefit of weight loss to improve her health. She was advised of the need for long term treatment and the importance of lifestyle modifications to improve her current health and to decrease her risk of future health problems.  AGREE: Multiple dietary modification options and treatment options were discussed and  Monica Clark agreed to follow the recommendations documented in the above note.  ARRANGE: Monica Clark was educated on the importance of frequent visits to treat obesity as outlined per CMS and USPSTF guidelines and agreed to schedule her next follow up appointment today.  I, Monica Clark, am acting as transcriptionist for Dennard Nip, MD  I have reviewed the above documentation for accuracy and completeness, and I agree with the above. -Dennard Nip, MD

## 2018-11-08 ENCOUNTER — Other Ambulatory Visit: Payer: BC Managed Care – PPO

## 2018-11-13 ENCOUNTER — Ambulatory Visit: Payer: BC Managed Care – PPO | Admitting: Endocrinology

## 2018-11-13 DIAGNOSIS — Z0289 Encounter for other administrative examinations: Secondary | ICD-10-CM

## 2018-11-18 ENCOUNTER — Other Ambulatory Visit: Payer: Self-pay

## 2018-11-18 ENCOUNTER — Ambulatory Visit (INDEPENDENT_AMBULATORY_CARE_PROVIDER_SITE_OTHER): Payer: BC Managed Care – PPO | Admitting: Family Medicine

## 2018-11-18 VITALS — BP 124/74 | HR 71 | Temp 98.4°F | Ht 63.0 in | Wt 212.0 lb

## 2018-11-18 DIAGNOSIS — Z9189 Other specified personal risk factors, not elsewhere classified: Secondary | ICD-10-CM

## 2018-11-18 DIAGNOSIS — Z6837 Body mass index (BMI) 37.0-37.9, adult: Secondary | ICD-10-CM

## 2018-11-18 DIAGNOSIS — R0602 Shortness of breath: Secondary | ICD-10-CM

## 2018-11-18 DIAGNOSIS — E119 Type 2 diabetes mellitus without complications: Secondary | ICD-10-CM | POA: Diagnosis not present

## 2018-11-18 DIAGNOSIS — Z794 Long term (current) use of insulin: Secondary | ICD-10-CM

## 2018-11-18 MED ORDER — RYBELSUS 3 MG PO TABS: 1.0000 | ORAL_TABLET | Freq: Every day | ORAL | 0 refills | Status: DC

## 2018-11-19 NOTE — Progress Notes (Signed)
Office: [phone]  /  Fax: [phone]   HPI:   Chief Complaint: OBESITY Monica Clark is here to discuss her progress with her obesity treatment plan. She is on the lower carbohydrate, vegetable and lean protein rich diet plan and is following her eating plan approximately 70 % of the time. She states she is exercising 0 minutes 0 times per week. Monica Clark has done well with weight loss on her Low carbohydrate plan. Her hunger is controlled, and although she deviates at times she still did well overall.  Her weight is 212 lb (96.2 kg) today and has had a weight loss of 4 pounds over a period of 2 weeks since her last visit. She has lost 4 lbs since starting treatment with [place].  Diabetes II Monica Clark has a diagnosis of diabetes type II. Monica Clark is on Rybelsus and her blood sugars have drop from 200's to 95-140's in 2 weeks. She actually stopped her insulin due to pharmacy error. She notes mild nausea but it is getting better. She denies hypoglycemia. Last A1c was 7.8. She has been working on intensive lifestyle modifications including diet, exercise, and weight loss to help control her blood glucose levels.  At risk for cardiovascular disease Monica Clark is at a higher than average risk for cardiovascular disease due to obesity and diabetes II. She currently denies any chest pain.  Shortness of Breath with Exertion Monica Clark notes increasing shortness of breath with exercising. She is not exercising much and no change in exercise intolerance. She would like advice on how to get started. Monica Clark denies shortness of breath at rest or orthopnea.  ASSESSMENT AND PLAN:  Type 2 diabetes mellitus without complication, with long-term current use of insulin (HCC) - Plan: Semaglutide (RYBELSUS) 3 MG TABS  SOB (shortness of breath) on exertion  At risk for heart disease  Class 2 severe obesity with serious comorbidity and body mass index (BMI) of 37.0 to 37.9 in adult, unspecified obesity type ([place])  PLAN:  Diabetes II  Monica Clark has been given extensive diabetes education by myself today including ideal fasting and post-prandial blood glucose readings, individual ideal Hgb A1c goals and hypoglycemia prevention. We discussed the importance of good blood sugar control to decrease the likelihood of diabetic complications such as nephropathy, neuropathy, limb loss, blindness, coronary artery disease, and death. We discussed the importance of intensive lifestyle modification including diet, exercise and weight loss as the first line treatment for diabetes. Monica Clark agrees to continue taking Rybelsus as is and continue her diet, and she is to hold [place] for now. Monica Clark agrees to follow up with our clinic in 2 to 3 weeks.  Cardiovascular risk counseling Monica Clark was given extended (15 minutes) coronary artery disease prevention counseling today. She is 63 y.o. female and has risk factors for heart disease including obesity and diabetes II. We discussed intensive lifestyle modifications today with an emphasis on specific weight loss instructions and strategies. Pt was also informed of the importance of increasing exercise and decreasing saturated fats to help prevent heart disease.  Shortness of Breath with Exertion Monica Clark's shortness of breath appears to be obesity related and exercise induced. She is to start very slowly, walking 5-10 minutes or using elliptical for 3-5 minutes to start to treat her exercise induced shortness of breath. If Monica Clark follows our instructions and loses weight without improvement of her shortness of breath, we will plan to refer to pulmonology. Monica Clark agrees to this plan. Monica Clark agrees to follow up with our clinic in 2 to 3  weeks.  Obesity Monica Clark is currently in the action stage of change. As such, her goal is to continue with weight loss efforts She has agreed to follow a lower carbohydrate, vegetable and lean protein rich diet plan Monica Clark has been instructed to work up to a goal of 150 minutes of combined  cardio and strengthening exercise per week for weight loss and overall health benefits. We discussed the following Behavioral Modification Strategies today: increasing lean protein intake and decreasing simple carbohydrates    Monica Clark has agreed to follow up with our clinic in 2 to 3 weeks. She was informed of the importance of frequent follow up visits to maximize her success with intensive lifestyle modifications for her multiple health conditions.  ALLERGIES: Allergies  Allergen Reactions  . Codeine Other (See Comments)    INSOMNIA    MEDICATIONS: Current Outpatient Medications on File Prior to Visit  Medication Sig Dispense Refill  . fenofibrate (TRICOR) 145 MG tablet Take 1 tablet (145 mg total) by mouth daily. 30 tablet 3  . glimepiride (AMARYL) 4 MG tablet Take 4 mg by mouth daily with breakfast.    . glucose blood (ONE TOUCH TEST STRIPS) test strip Test two times a day 100 each 0  . Insulin Pen Needle 31G X 5 MM MISC Use once a day 100 each 1  . JARDIANCE 25 MG TABS tablet TAKE 1 TABLET BY MOUTH ONCE DAILY. DX:E11.65 30 tablet 1  . metFORMIN (GLUCOPHAGE-XR) 500 MG 24 hr tablet Take 4 tablets (2,000 mg total) by mouth daily with supper. 120 tablet 3  . SOLIQUA 100-33 UNT-MCG/ML SOPN INJECT 30 UNITS INTO THE SKIN DAILY. 9 pen 3  . tamoxifen (NOLVADEX) 20 MG tablet Take 1 tablet (20 mg total) by mouth daily. 90 tablet 3  . triamterene-hydrochlorothiazide (MAXZIDE-25) 37.5-25 MG per tablet Take 1 tablet by mouth every morning. Reported on 09/03/2015    . Vitamin D, Ergocalciferol, (DRISDOL) 1.25 MG (50000 UT) CAPS capsule Take 1 capsule (50,000 Units total) by mouth every 7 (seven) days. 4 capsule 0   No current facility-administered medications on file prior to visit.     PAST MEDICAL HISTORY: Past Medical History:  Diagnosis Date  . Breast mass, left    duct mass  . Depression   . Endometrial hyperplasia   . Fuchs' corneal dystrophy   . Gallbladder problem   . Heart murmur     states no known problems, has never seen a cardiologist  . Hypertension    states under control with med., has been on med. x 5 yr.  . Immature cataract   . Lower extremity edema   . 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 Nipple discharge 04/2014   left breast  . Non-insulin dependent type 2 diabetes mellitus ([place])     PAST SURGICAL HISTORY: Past Surgical History:  Procedure Laterality Date  . BREAST BIOPSY Left 04/30/2014   Procedure: LEFT BREAST BIOPSY;  Surgeon: Jackolyn Confer, MD;  Location: [facility];  Service: General;  Laterality: Left;  . BREAST DUCTAL SYSTEM EXCISION Left 04/30/2014   Procedure: LEFT NIPPLE DUCT EXCISION;  Surgeon: Jackolyn Confer, MD;  Location: [facility];  Service: General;  Laterality: Left;  . [place]  . CHOLECYSTECTOMY  1989  . ROBOTIC ASSISTED TOTAL HYSTERECTOMY WITH BILATERAL SALPINGO OOPHERECTOMY Bilateral 10/13/2014   Procedure: ROBOTIC ASSISTED TOTAL HYSTERECTOMY WITH BILATERAL SALPINGO OOPHORECTOMY ;  Surgeon: Everitt Amber, MD;  Location: [facility];  Service: Gynecology;  Laterality: Bilateral;    SOCIAL HISTORY: Social History   Tobacco Use  . Smoking status: Never Smoker  . Smokeless tobacco: Never Used  Substance Use Topics  . Alcohol use: Yes    Comment: rarely  . Drug use: No    FAMILY HISTORY: Family History  Problem Relation Age of Onset  . Diabetes Mother   . Obesity Mother   . Heart disease Father   . Cancer Father        throat cancer   . Sudden death Father   . Alcoholism Father   . Diabetes Sister   . Diabetes Brother     ROS: Review of Systems  Constitutional: Positive for weight loss.  Respiratory: Positive for shortness of breath (with exertion).   Cardiovascular: Negative for chest pain and orthopnea.  Gastrointestinal: Positive for nausea.  Endo/Heme/Allergies:       Negative hypoglycemia    PHYSICAL EXAM: Blood pressure 124/74, pulse 71, temperature 98.4 F  (36.9 C), temperature source Oral, height 5\' 3"  (1.6 m), weight 212 lb (96.2 kg), SpO2 98 %. Body mass index is 37.55 kg/m. Physical Exam Vitals signs reviewed.  Constitutional:      Appearance: Normal appearance. She is obese.  Cardiovascular:     Rate and Rhythm: Normal rate.     Pulses: Normal pulses.  Pulmonary:     Effort: Pulmonary effort is normal.     Breath sounds: Normal breath sounds.  Musculoskeletal: Normal range of motion.  Skin:    General: Skin is warm and dry.  Neurological:     Mental Status: She is alert and oriented to person, place, and time.  Psychiatric:        Mood and Affect: Mood normal.        Behavior: Behavior normal.     RECENT LABS AND TESTS: BMET    Component Value Date/Time   NA 138 10/21/2018 1338   NA 143 04/16/2018 1229   NA 141 03/03/2016 1226   K 3.6 10/21/2018 1338   K 3.5 03/03/2016 1226   CL 105 10/21/2018 1338   CO2 24 10/21/2018 1338   CO2 24 03/03/2016 1226   GLUCOSE 80 10/21/2018 1338   GLUCOSE 113 03/03/2016 1226   BUN 12 10/21/2018 1338   BUN 12 04/16/2018 1229   BUN 8.5 03/03/2016 1226   CREATININE 0.85 10/21/2018 1338   CREATININE 0.8 03/03/2016 1226   CALCIUM 9.0 10/21/2018 1338   CALCIUM 10.0 03/03/2016 1226   GFRNONAA >60 10/21/2018 1338   GFRAA >60 10/21/2018 1338   Lab Results  Component Value Date   HGBA1C 7.8 (H) 09/13/2018   HGBA1C 9.0 (H) 04/16/2018   HGBA1C 8.2 06/20/2017   HGBA1C 15.5 02/09/2017   HGBA1C 11.1 (H) 10/26/2016   Lab Results  Component Value Date   INSULIN 8.7 04/16/2018   CBC    Component Value Date/Time   WBC 12.6 (H) 10/21/2018 1338   RBC 4.46 10/21/2018 1338   HGB 13.4 10/21/2018 1338   HGB 15.0 04/16/2018 1229   HGB 13.8 03/03/2016 1226   HCT 41.7 10/21/2018 1338   HCT 45.0 04/16/2018 1229   HCT 41.1 03/03/2016 1226   PLT 346 10/21/2018 1338   PLT 346 03/03/2016 1226   MCV 93.5 10/21/2018 1338   MCV 91 04/16/2018 1229   MCV 91.7 03/03/2016 1226   MCH 30.0  10/21/2018 1338   MCHC 32.1 10/21/2018 1338   RDW 13.7 10/21/2018 1338   RDW 14.1 04/16/2018 1229  RDW 14.1 03/03/2016 1226   LYMPHSABS 4.3 (H) 10/21/2018 1338   LYMPHSABS 3.4 (H) 04/16/2018 1229   LYMPHSABS 2.8 03/03/2016 1226   MONOABS 0.5 10/21/2018 1338   MONOABS 0.3 03/03/2016 1226   EOSABS 0.2 10/21/2018 1338   EOSABS 0.2 04/16/2018 1229   BASOSABS 0.1 10/21/2018 1338   BASOSABS 0.1 04/16/2018 1229   BASOSABS 0.1 03/03/2016 1226   Iron/TIBC/Ferritin/ %Sat No results found for: IRON, TIBC, FERRITIN, IRONPCTSAT Lipid Panel     Component Value Date/Time   CHOL 173 09/13/2018 0906   CHOL 216 (H) 04/16/2018 1229   TRIG 327.0 (H) 09/13/2018 0906   HDL 31.80 (L) 09/13/2018 0906   HDL 43 04/16/2018 1229   CHOLHDL 5 09/13/2018 0906   VLDL 65.4 (H) 09/13/2018 0906   LDLCALC 126 (H) 04/16/2018 1229   LDLDIRECT 91.0 09/13/2018 0906   Hepatic Function Panel     Component Value Date/Time   PROT 7.6 10/21/2018 1338   PROT 7.8 04/16/2018 1229   PROT 8.2 03/03/2016 1226   ALBUMIN 3.5 10/21/2018 1338   ALBUMIN 4.4 04/16/2018 1229   ALBUMIN 3.7 03/03/2016 1226   AST 21 10/21/2018 1338   AST 72 (H) 03/03/2016 1226   ALT 15 10/21/2018 1338   ALT 46 03/03/2016 1226   ALKPHOS 75 10/21/2018 1338   ALKPHOS 118 03/03/2016 1226   BILITOT 0.2 (L) 10/21/2018 1338   BILITOT 0.4 04/16/2018 1229   BILITOT 0.61 03/03/2016 1226       OBESITY BEHAVIORAL INTERVENTION VISIT  Today's visit was # 8   Starting weight: 216 lbs Starting date: 04/16/2018 Today's weight : 212 lbs  Today's date: 11/18/2018 Total lbs lost to date: 4    ASK: We discussed the diagnosis of obesity with Monica Clark today and Monica Clark agreed to give [place] permission to discuss obesity behavioral modification therapy today.  ASSESS: Monica Clark has the diagnosis of obesity and her BMI today is 37.56 Monica Clark is in the action stage of change   ADVISE: Monica Clark was educated on the multiple health risks of obesity as well  as the benefit of weight loss to improve her health. She was advised of the need for long term treatment and the importance of lifestyle modifications to improve her current health and to decrease her risk of future health problems.  AGREE: Multiple dietary modification options and treatment options were discussed and  Monica Clark agreed to follow the recommendations documented in the above note.  ARRANGE: Monica Clark was educated on the importance of frequent visits to treat obesity as outlined per CMS and USPSTF guidelines and agreed to schedule her next follow up appointment today.  I, Trixie Dredge, am acting as transcriptionist for Dennard Nip, MD  I have reviewed the above documentation for accuracy and completeness, and I agree with the above. -Dennard Nip, MD

## 2018-11-24 ENCOUNTER — Other Ambulatory Visit (INDEPENDENT_AMBULATORY_CARE_PROVIDER_SITE_OTHER): Payer: Self-pay | Admitting: Family Medicine

## 2018-11-24 DIAGNOSIS — E559 Vitamin D deficiency, unspecified: Secondary | ICD-10-CM

## 2018-12-03 ENCOUNTER — Other Ambulatory Visit: Payer: Self-pay

## 2018-12-03 ENCOUNTER — Other Ambulatory Visit (INDEPENDENT_AMBULATORY_CARE_PROVIDER_SITE_OTHER): Payer: BC Managed Care – PPO

## 2018-12-03 DIAGNOSIS — E782 Mixed hyperlipidemia: Secondary | ICD-10-CM | POA: Diagnosis not present

## 2018-12-03 DIAGNOSIS — E1165 Type 2 diabetes mellitus with hyperglycemia: Secondary | ICD-10-CM | POA: Diagnosis not present

## 2018-12-03 LAB — COMPREHENSIVE METABOLIC PANEL
ALT: 13 U/L (ref 0–35)
AST: 22 U/L (ref 0–37)
Albumin: 3.7 g/dL (ref 3.5–5.2)
Alkaline Phosphatase: 66 U/L (ref 39–117)
BUN: 10 mg/dL (ref 6–23)
CO2: 25 mEq/L (ref 19–32)
Calcium: 9 mg/dL (ref 8.4–10.5)
Chloride: 109 mEq/L (ref 96–112)
Creatinine, Ser: 0.85 mg/dL (ref 0.40–1.20)
GFR: 82.09 mL/min (ref >60.00)
Glucose, Bld: 130 mg/dL — ABNORMAL HIGH (ref 70–99)
Potassium: 3.8 mEq/L (ref 3.5–5.1)
Sodium: 142 mEq/L (ref 135–145)
Total Bilirubin: 0.4 mg/dL (ref 0.2–1.2)
Total Protein: 7.1 g/dL (ref 6.0–8.3)

## 2018-12-03 LAB — LIPID PANEL
Cholesterol: 178 mg/dL (ref 0–200)
HDL: 29.7 mg/dL — ABNORMAL LOW (ref >39.00)
NonHDL: 148.37
Total CHOL/HDL Ratio: 6
Triglycerides: 260 mg/dL — ABNORMAL HIGH (ref 0.0–149.0)
VLDL: 52 mg/dL — ABNORMAL HIGH (ref 0.0–40.0)

## 2018-12-03 LAB — LDL CHOLESTEROL, DIRECT: Direct LDL: 110 mg/dL

## 2018-12-04 LAB — FRUCTOSAMINE: Fructosamine: 231 umol/L (ref 0–285)

## 2018-12-05 ENCOUNTER — Encounter (INDEPENDENT_AMBULATORY_CARE_PROVIDER_SITE_OTHER): Payer: Self-pay | Admitting: Family Medicine

## 2018-12-05 ENCOUNTER — Ambulatory Visit (INDEPENDENT_AMBULATORY_CARE_PROVIDER_SITE_OTHER): Payer: BC Managed Care – PPO | Admitting: Family Medicine

## 2018-12-05 ENCOUNTER — Other Ambulatory Visit: Payer: Self-pay

## 2018-12-05 VITALS — BP 106/72 | HR 72 | Temp 98.2°F | Ht 63.0 in | Wt 208.0 lb

## 2018-12-05 DIAGNOSIS — Z9189 Other specified personal risk factors, not elsewhere classified: Secondary | ICD-10-CM

## 2018-12-05 DIAGNOSIS — E559 Vitamin D deficiency, unspecified: Secondary | ICD-10-CM | POA: Diagnosis not present

## 2018-12-05 DIAGNOSIS — E119 Type 2 diabetes mellitus without complications: Secondary | ICD-10-CM | POA: Diagnosis not present

## 2018-12-05 DIAGNOSIS — Z794 Long term (current) use of insulin: Secondary | ICD-10-CM

## 2018-12-05 DIAGNOSIS — Z6837 Body mass index (BMI) 37.0-37.9, adult: Secondary | ICD-10-CM

## 2018-12-05 MED ORDER — VITAMIN D (ERGOCALCIFEROL) 1.25 MG (50000 UNIT) PO CAPS: 50000.0000 [IU] | ORAL_CAPSULE | ORAL | 0 refills | Status: DC

## 2018-12-06 ENCOUNTER — Encounter: Payer: Self-pay | Admitting: Endocrinology

## 2018-12-06 ENCOUNTER — Ambulatory Visit (INDEPENDENT_AMBULATORY_CARE_PROVIDER_SITE_OTHER): Payer: BC Managed Care – PPO | Admitting: Endocrinology

## 2018-12-06 DIAGNOSIS — I10 Essential (primary) hypertension: Secondary | ICD-10-CM | POA: Diagnosis not present

## 2018-12-06 DIAGNOSIS — E782 Mixed hyperlipidemia: Secondary | ICD-10-CM | POA: Diagnosis not present

## 2018-12-06 DIAGNOSIS — E1165 Type 2 diabetes mellitus with hyperglycemia: Secondary | ICD-10-CM | POA: Diagnosis not present

## 2018-12-06 LAB — VITAMIN D 25 HYDROXY (VIT D DEFICIENCY, FRACTURES): Vit D, 25-Hydroxy: 30.7 ng/mL (ref 30.0–100.0)

## 2018-12-06 LAB — HEMOGLOBIN A1C
Est. average glucose Bld gHb Est-mCnc: 169 mg/dL
Hgb A1c MFr Bld: 7.5 % — ABNORMAL HIGH (ref 4.8–5.6)

## 2018-12-06 NOTE — Progress Notes (Signed)
Patient ID: Monica Clark, female   DOB: Mar 19, 1955, 63 y.o.   MRN: [id number]           Reason for Appointment: Follow-up for Type 2 Diabetes  Referring physician: [name]  Today's office visit was provided via telemedicine using video technique The patient was explained the limitations of evaluation and management by telemedicine and the availability of in person appointments.  The patient understood the limitations and agreed to proceed. Patient also understood that the telehealth visit is billable. . Location of the patient: Patient's home . Location of the provider: Physician office Only the patient and myself were participating in the encounter    History of Present Illness:          Date of diagnosis of type 2 diabetes mellitus: 2010       Background history:   She is not clear when her diabetes for diagnosis but had a high A1c in 2010 She has been on various oral medications over the last few years including Januvia  Amaryl was probably started in 2015 in addition to metformin She thinks her control was good only in the first few years, A1c 2016 was 9.5 She was without medication for some time in December 2018 when A1c was 15+  A1c in December 2019 was 15.5 and usually over 10  Recent history:        Her A1c is relatively better at 8.2, previously and   Non-insulin hypoglycemic drugs: Metformin ER 3-4, Amaryl 4 mg daily,, Jardiance 25 mg daily, Rybelsus 3 mg daily SOLIQUA 30 units as needed   Current management, blood sugar patterns and problems identified:  Her A1c is now 7.5, previously 7.3   She was switched from [place] to [place] on last visit on 06/21/2018 because of high fasting readings on Ozempic alone  On her last visit she was told to change the Soliqua to the morning and use 38 units consistently  However it appears that she has started going to the weight loss clinic and for some reason even though she was on [place] she was changed to Rybelsus 3  mg daily about a month or so ago  She has however stated her diet significantly and appears to have lost up to 8 pounds  Also her blood sugars appear to be relatively better in the morning  Because of her sugars being improved she has taken only [place] only when the blood sugar is significantly high  She thinks her blood sugars have been occasionally below 100  She has however not done any walking for exercise  No blood sugars available after meals  She thinks she is still continuing her Jardiance and Amaryl unchanged  She still cannot take the full dose of metformin and mostly taking 2 in the evening and 1 in the morning       Side effects from medications have been: Mild diarrhea from regular metformin  Compliance with the medical regimen: Fair Hypoglycemia: None    Glucose monitoring:  done 0-1  times a day         Glucometer:  One Touch Ultra  Blood Glucose readings by patient history  FASTING blood sugar range 104-167, previously 108-217 Recent average blood sugar about 122  Self-care: The diet that the patient has been following is: tries to limit carbs.     Typical meal intake: Breakfast is Egg, fruit.  Lunch is chicken, vegetables, fruit and dinner is starch, meat and fruit.  Snacks: Granola bar and  fruit               Dietician visit, most recent: 2013                 Weight history:209-229  Wt Readings from Last 3 Encounters:  12/05/18 208 lb (94.3 kg)  11/18/18 212 lb (96.2 kg)  11/04/18 216 lb (98 kg)    Glycemic control:   Lab Results  Component Value Date   HGBA1C 7.5 (H) 12/05/2018   HGBA1C 7.8 (H) 09/13/2018   HGBA1C 9.0 (H) 04/16/2018   Lab Results  Component Value Date   MICROALBUR 0.8 10/26/2016   LDLCALC 126 (H) 04/16/2018   CREATININE 0.85 12/03/2018     Lab Results  Component Value Date   MICRALBCREAT 1.5 10/26/2016       Allergies as of 12/06/2018      Reactions   Codeine Other (See Comments)   INSOMNIA      Medication  List       Accurate as of December 06, 2018  3:57 PM. If you have any questions, ask your nurse or doctor.        STOP taking these medications   Soliqua 100-33 UNT-MCG/ML Sopn Generic drug: Insulin Glargine-Lixisenatide Stopped by: Elayne Snare, MD     TAKE these medications   fenofibrate 145 MG tablet Commonly known as: Tricor Take 1 tablet (145 mg total) by mouth daily.   glimepiride 4 MG tablet Commonly known as: AMARYL Take 4 mg by mouth daily with breakfast.   glucose blood test strip Commonly known as: ONE TOUCH TEST STRIPS Test two times a day   Insulin Pen Needle 31G X 5 MM Misc Use once a day   Jardiance 25 MG Tabs tablet Generic drug: empagliflozin TAKE 1 TABLET BY MOUTH ONCE DAILY. DX:E11.65   metFORMIN 500 MG 24 hr tablet Commonly known as: GLUCOPHAGE-XR Take 4 tablets (2,000 mg total) by mouth daily with supper. What changed:   how much to take  additional instructions   Rybelsus 3 MG Tabs Generic drug: Semaglutide Take 1 tablet by mouth daily.   tamoxifen 20 MG tablet Commonly known as: NOLVADEX Take 1 tablet (20 mg total) by mouth daily.   triamterene-hydrochlorothiazide 37.5-25 MG tablet Commonly known as: MAXZIDE-25 Take 1 tablet by mouth every morning. Reported on 09/03/2015   Vitamin D (Ergocalciferol) 1.25 MG (50000 UT) Caps capsule Commonly known as: DRISDOL Take 1 capsule (50,000 Units total) by mouth every 7 (seven) days.       Allergies:  Allergies  Allergen Reactions  . Codeine Other (See Comments)    INSOMNIA    Past Medical History:  Diagnosis Date  . Breast mass, left    duct mass  . Depression   . Endometrial hyperplasia   . Fuchs' corneal dystrophy   . Gallbladder problem   . Heart murmur    states no known problems, has never seen a cardiologist  . Hypertension    states under control with med., has been on med. x 5 yr.  . Immature cataract   . Lower extremity edema   . 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 Nipple discharge 04/2014   left breast  . Non-insulin dependent type 2 diabetes mellitus [facility])     Past Surgical History:  Procedure Laterality Date  . BREAST BIOPSY Left 04/30/2014   Procedure: LEFT BREAST BIOPSY;  Surgeon: Jackolyn Confer, MD;  Location: [place];  Service: General;  Laterality: Left;  .  BREAST DUCTAL SYSTEM EXCISION Left 04/30/2014   Procedure: LEFT NIPPLE DUCT EXCISION;  Surgeon: Jackolyn Confer, MD;  Location: [place];  Service: General;  Laterality: Left;  . [place]  . CHOLECYSTECTOMY  1989  . ROBOTIC ASSISTED TOTAL HYSTERECTOMY WITH BILATERAL SALPINGO OOPHERECTOMY Bilateral 10/13/2014   Procedure: ROBOTIC ASSISTED TOTAL HYSTERECTOMY WITH BILATERAL SALPINGO OOPHORECTOMY ;  Surgeon: Everitt Amber, MD;  Location: [facility];  Service: Gynecology;  Laterality: Bilateral;    Family History  Problem Relation Age of Onset  . Diabetes Mother   . Obesity Mother   . Heart disease Father   . Cancer Father        throat cancer   . Sudden death Father   . Alcoholism Father   . Diabetes Sister   . Diabetes Brother     Social History:  reports that she has never smoked. She has never used smokeless tobacco. She reports current alcohol use. She reports that she does not use drugs.   Review of Systems   Lipid history: LDL previously has been below 100  However she tends to have persistently high triglycerides Not on any treatment from PCP, previously had taken pravastatin  Because of high triglycerides she has not started taking fenofibrate Triglycerides are improved but still over 250 and LDL is now 110 by direct measurement      Lab Results  Component Value Date   CHOL 178 12/03/2018   CHOL 173 09/13/2018   CHOL 216 (H) 04/16/2018   Lab Results  Component Value Date   HDL 29.70 (L) 12/03/2018   HDL 31.80 (L) 09/13/2018   HDL 43 04/16/2018   Lab Results  Component Value Date   LDLCALC 126 (H)  04/16/2018   LDLCALC (H) 10/24/2008    157        Total Cholesterol/HDL:CHD Risk Coronary Heart Disease Risk Table                     Men   Women  1/2 Average Risk   3.4   3.3  Average Risk       5.0   4.4  2 X Average Risk   9.6   7.1  3 X Average Risk  23.4   11.0        Use the calculated Patient Ratio above and the CHD Risk Table to determine the patient's CHD Risk.        ATP III CLASSIFICATION (LDL):  <100     mg/dL   Optimal  100-129  mg/dL   Near or Above                    Optimal  130-159  mg/dL   Borderline  160-189  mg/dL   High  >190     mg/dL   Very High   Lab Results  Component Value Date   TRIG 260.0 (H) 12/03/2018   TRIG 327.0 (H) 09/13/2018   TRIG 237 (H) 04/16/2018   Lab Results  Component Value Date   CHOLHDL 6 12/03/2018   CHOLHDL 5 09/13/2018   CHOLHDL 6 10/26/2016   Lab Results  Component Value Date   LDLDIRECT 110.0 12/03/2018   LDLDIRECT 91.0 09/13/2018   LDLDIRECT 95.0 10/26/2016    Hypertension: Currently treated with only Maxzide by her PCP Also on Jardiance  Not on ACE inhibitor/ARB    LABS:  Office Visit on 12/05/2018  Component Date Value Ref Range Status  .  Hgb A1c MFr Bld 12/05/2018 7.5* 4.8 - 5.6 % Final   Comment:          Prediabetes: 5.7 - 6.4          Diabetes: >6.4          Glycemic control for adults with diabetes: <7.0   . Est. average glucose Bld gHb Est-m* 12/05/2018 169  mg/dL Final  . Vit D, 25-Hydroxy 12/05/2018 30.7  30.0 - 100.0 ng/mL Final   Comment: Vitamin D deficiency has been defined by the [place] practice guideline as a level of serum 25-OH vitamin D less than 20 ng/mL (1,2). The Endocrine Society went on to further define vitamin D insufficiency as a level between 21 and 29 ng/mL (2). 1. IOM (Institute of Medicine). 2010. Dietary reference    intakes for calcium and D. [place]: The    [employer]. 2. Holick MF, Binkley [place],  Bischoff-Ferrari HA, et al.    Evaluation, treatment, and prevention of vitamin D    deficiency: an Endocrine Society clinical practice    guideline. JCEM. 2011 Jul; 96(7):1911-30.   Lab on 12/03/2018  Component Date Value Ref Range Status  . Cholesterol 12/03/2018 178  0 - 200 mg/dL Final   ATP III Classification       Desirable:  < 200 mg/dL               Borderline High:  200 - 239 mg/dL          High:  > = 240 mg/dL  . Triglycerides 12/03/2018 260.0* 0.0 - 149.0 mg/dL Final   Normal:  <150 mg/dLBorderline High:  150 - 199 mg/dL  . HDL 12/03/2018 29.70* >39.00 mg/dL Final  . VLDL 12/03/2018 52.0* 0.0 - 40.0 mg/dL Final  . Total CHOL/HDL Ratio 12/03/2018 6   Final                  Men          Women1/2 Average Risk     3.4          3.3Average Risk          5.0          4.42X Average Risk          9.6          7.13X Average Risk          15.0          11.0                      . NonHDL 12/03/2018 148.37   Final   NOTE:  Non-HDL goal should be 30 mg/dL higher than patient's LDL goal (i.e. LDL goal of < 70 mg/dL, would have non-HDL goal of < 100 mg/dL)  . Fructosamine 12/03/2018 231  0 - 285 umol/L Final   Comment: Published reference interval for apparently healthy subjects between age 12 and 74 is 45 - 285 umol/L and in a poorly controlled diabetic population is 228 - 563 umol/L with a mean of 396 umol/L.   [place] Kitchen Sodium 12/03/2018 142  135 - 145 mEq/L Final  . Potassium 12/03/2018 3.8  3.5 - 5.1 mEq/L Final  . Chloride 12/03/2018 109  96 - 112 mEq/L Final  . CO2 12/03/2018 25  19 - 32 mEq/L Final  . Glucose, Bld 12/03/2018 130* 70 - 99 mg/dL Final  . BUN 12/03/2018 10  6 - 23 mg/dL Final  . Creatinine, Ser 12/03/2018 0.85  0.40 - 1.20 mg/dL Final  . Total Bilirubin 12/03/2018 0.4  0.2 - 1.2 mg/dL Final  . Alkaline Phosphatase 12/03/2018 66  39 - 117 U/L Final  . AST 12/03/2018 22  0 - 37 U/L Final  . ALT 12/03/2018 13  0 - 35 U/L Final  . Total Protein 12/03/2018 7.1  6.0 - 8.3 g/dL  Final  . Albumin 12/03/2018 3.7  3.5 - 5.2 g/dL Final  . Calcium 12/03/2018 9.0  8.4 - 10.5 mg/dL Final  . GFR 12/03/2018 82.09  >60.00 mL/min Final  . Direct LDL 12/03/2018 110.0  mg/dL Final   Optimal:  <100 mg/dLNear or Above Optimal:  100-129 mg/dLBorderline High:  130-159 mg/dLHigh:  160-189 mg/dLVery High:  >190 mg/dL    Physical Examination:  There were no vitals taken for this visit.         ASSESSMENT:  Diabetes type 2, with obesity  See history of present illness for detailed discussion of current diabetes management, blood sugar patterns and problems identified  Her A1c is 7.5  She appears to have had some weight loss recently and this is likely to be from her watching her diet significantly better  Even though previously she had not had improved control with doing Ozempic she had done better with adding basal insulin in the form of [place] recently however she has not taken her [place] as directed Currently even with only 3 mg of Rybelsus her blood sugars are relatively good although still as high as 167 fasting She does not monitor after meals  Discussed the differences between Rybelsus, [place] and Ozempic Explained that if she can continue on a GLP-1 by itself without insulin she can likely achieve fairly good control especially if she loses weight   LIPIDS: Has persistently high triglycerides even with improved blood sugar control and starting fenofibrate  LDL now slightly above 100  Hypertension, appears well-controlled, needs periodic urine microalbumin testing and consider low-dose ARB drug   PLAN:    She will need to increase her Rybelsus to 7 mg to get more consistent control of her blood sugars better periodically high.  She can use 2 tablets of the 3 mg Rybelsus and then switch to the new prescription  If she is able to get consistent control with this alone she will not need to go back to [place]  However overall higher doses of Ozempic will  provide more efficacy than Rybelsus and may need to consider this also  Emphasized the need to start walking for exercise  She also needs to do some readings after meals  Also if she starts getting low sugars during the day will reduce Amaryl  For her lipids will continue same regimen but consider adding a statin drug especially with triglycerides and LDL both continue to stay high    There are no Patient Instructions on file for this visit.  Total visit time for evaluation and management of multiple problems and counseling =25 minutes  Elayne Snare 12/06/2018, 3:57 PM   Note: This office note was prepared with Dragon voice recognition system technology. Any transcriptional errors that result from this process are unintentional.

## 2018-12-11 NOTE — Progress Notes (Signed)
Office: [phone]  /  Fax: [phone]   HPI:   Chief Complaint: OBESITY Monica Clark is here to discuss her progress with her obesity treatment plan. She is on the lower carbohydrate, vegetable and lean protein rich diet plan and is following her eating plan approximately 80 % of the time. She states she is exercising 0 minutes 0 times per week. Monica Clark has done well with weight loss on her Low carbohydrate plan. She has done well decreasing simple carbohydrates and increasing vegetables, but she doesn't feel she can continue to be this strict.  Her weight is 208 lb (94.3 kg) today and has had a weight loss of 4 pounds over a period of 2 weeks since her last visit. She has lost 8 lbs since starting treatment with [place].  Vitamin D Deficiency Monica Clark has a diagnosis of vitamin D deficiency. She is stable on prescription Vit D and she is due for labs. She denies nausea, vomiting or muscle weakness.  Diabetes II Monica Clark has a diagnosis of diabetes type II. Monica Clark states her fasting BGs have improved greatly with her eating plan. She is on Rybelsus and feels it is helping decrease polyphagia. She has stopped her Willeen Niece as she was dropping into the 60's on her own. Last A1c was 7.8. She has been working on intensive lifestyle modifications including diet, exercise, and weight loss to help control her blood glucose levels.  At risk for cardiovascular disease Monica Clark is at a higher than average risk for cardiovascular disease due to obesity and diabetes II. She currently denies any chest pain.  ASSESSMENT AND PLAN:  Vitamin D deficiency - Plan: Vitamin D, Ergocalciferol, (DRISDOL) 1.25 MG (50000 UT) CAPS capsule, VITAMIN D 25 Hydroxy (Vit-D Deficiency, Fractures)  Type 2 diabetes mellitus without complication, with long-term current use of insulin (HCC) - Plan: Hemoglobin A1c  At risk for heart disease  Class 2 severe obesity with serious comorbidity and body mass index (BMI) of 37.0 to 37.9 in adult,  unspecified obesity type ([place])  PLAN:  Vitamin D Deficiency Laterra was informed that low vitamin D levels contributes to fatigue and are associated with obesity, breast, and colon cancer. Monica Clark agrees to continue taking prescription Vit D 50,000 IU every week #4 and we will refill for 1 month. She will follow up for routine testing of vitamin D, at least 2-3 times per year. She was informed of the risk of over-replacement of vitamin D and agrees to not increase her dose unless she discusses this with [place] first. We will check labs today. Monica Clark agrees to follow up with our clinic in 2 to 3 weeks.  Diabetes II Monica Clark has been given extensive diabetes education by myself today including ideal fasting and post-prandial blood glucose readings, individual ideal Hgb A1c goals and hypoglycemia prevention. We discussed the importance of good blood sugar control to decrease the likelihood of diabetic complications such as nephropathy, neuropathy, limb loss, blindness, coronary artery disease, and death. We discussed the importance of intensive lifestyle modification including diet, exercise and weight loss as the first line treatment for diabetes. We will check labs today. Monica Clark agrees to continue taking Rybelsus and she agreed to discuss this further with Dr. Dwyane Dee. Monica Clark agrees to follow up with our clinic in 2 to 3 weeks.  Cardiovascular risk counseling Monica Clark was given extended (30 minutes) coronary artery disease prevention counseling today. She is 63 y.o. female and has risk factors for heart disease including obesity and diabetes II. We discussed intensive lifestyle modifications  today with an emphasis on specific weight loss instructions and strategies. Pt was also informed of the importance of increasing exercise and decreasing saturated fats to help prevent heart disease.  Obesity Monica Clark is currently in the action stage of change. As such, her goal is to continue with weight loss efforts She has agreed to  change to follow the Category 2 plan Monica Clark has been instructed to work up to a goal of 150 minutes of combined cardio and strengthening exercise per week for weight loss and overall health benefits. We discussed the following Behavioral Modification Strategies today: increasing lean protein intake and decreasing simple carbohydrates    Monica Clark has agreed to follow up with our clinic in 2 to 3 weeks. She was informed of the importance of frequent follow up visits to maximize her success with intensive lifestyle modifications for her multiple health conditions.  ALLERGIES: Allergies  Allergen Reactions  . Codeine Other (See Comments)    INSOMNIA    MEDICATIONS: Current Outpatient Medications on File Prior to Visit  Medication Sig Dispense Refill  . fenofibrate (TRICOR) 145 MG tablet Take 1 tablet (145 mg total) by mouth daily. 30 tablet 3  . glimepiride (AMARYL) 4 MG tablet Take 4 mg by mouth daily with breakfast.    . glucose blood (ONE TOUCH TEST STRIPS) test strip Test two times a day 100 each 0  . Insulin Pen Needle 31G X 5 MM MISC Use once a day 100 each 1  . JARDIANCE 25 MG TABS tablet TAKE 1 TABLET BY MOUTH ONCE DAILY. DX:E11.65 30 tablet 1  . metFORMIN (GLUCOPHAGE-XR) 500 MG 24 hr tablet Take 4 tablets (2,000 mg total) by mouth daily with supper. (Patient taking differently: Take 1,000 mg by mouth daily with supper. Take 2 tabs by mouth once daily.) 120 tablet 3  . Semaglutide (RYBELSUS) 3 MG TABS Take 1 tablet by mouth daily. 30 tablet 0  . tamoxifen (NOLVADEX) 20 MG tablet Take 1 tablet (20 mg total) by mouth daily. 90 tablet 3  . triamterene-hydrochlorothiazide (MAXZIDE-25) 37.5-25 MG per tablet Take 1 tablet by mouth every morning. Reported on 09/03/2015     No current facility-administered medications on file prior to visit.     PAST MEDICAL HISTORY: Past Medical History:  Diagnosis Date  . Breast mass, left    duct mass  . Depression   . Endometrial hyperplasia   .  Fuchs' corneal dystrophy   . Gallbladder problem   . Heart murmur    states no known problems, has never seen a cardiologist  . Hypertension    states under control with med., has been on med. x 5 yr.  . Immature cataract   . Lower extremity edema   . 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)   . Nipple discharge 04/2014   left breast  . Non-insulin dependent type 2 diabetes mellitus ([place])     PAST SURGICAL HISTORY: Past Surgical History:  Procedure Laterality Date  . BREAST BIOPSY Left 04/30/2014   Procedure: LEFT BREAST BIOPSY;  Surgeon: Jackolyn Confer, MD;  Location: [facility];  Service: General;  Laterality: Left;  . BREAST DUCTAL SYSTEM EXCISION Left 04/30/2014   Procedure: LEFT NIPPLE DUCT EXCISION;  Surgeon: Jackolyn Confer, MD;  Location: [facility];  Service: General;  Laterality: Left;  . [place]  . CHOLECYSTECTOMY  1989  . ROBOTIC ASSISTED TOTAL HYSTERECTOMY WITH BILATERAL SALPINGO OOPHERECTOMY Bilateral 10/13/2014   Procedure: ROBOTIC ASSISTED TOTAL HYSTERECTOMY WITH BILATERAL  SALPINGO OOPHORECTOMY ;  Surgeon: Everitt Amber, MD;  Location: [facility];  Service: Gynecology;  Laterality: Bilateral;    SOCIAL HISTORY: Social History   Tobacco Use  . Smoking status: Never Smoker  . Smokeless tobacco: Never Used  Substance Use Topics  . Alcohol use: Yes    Comment: rarely  . Drug use: No    FAMILY HISTORY: Family History  Problem Relation Age of Onset  . Diabetes Mother   . Obesity Mother   . Heart disease Father   . Cancer Father        throat cancer   . Sudden death Father   . Alcoholism Father   . Diabetes Sister   . Diabetes Brother     ROS: Review of Systems  Constitutional: Positive for weight loss.  Cardiovascular: Negative for chest pain.  Gastrointestinal: Negative for nausea and vomiting.  Musculoskeletal:       Negative muscle weakness  Endo/Heme/Allergies:       Positive polyphagia    PHYSICAL EXAM:  Blood pressure 106/72, pulse 72, temperature 98.2 F (36.8 C), temperature source Oral, height 5\' 3"  (1.6 m), weight 208 lb (94.3 kg), SpO2 99 %. Body mass index is 36.85 kg/m. Physical Exam Vitals signs reviewed.  Constitutional:      Appearance: Normal appearance. She is obese.  Cardiovascular:     Rate and Rhythm: Normal rate.     Pulses: Normal pulses.  Pulmonary:     Effort: Pulmonary effort is normal.     Breath sounds: Normal breath sounds.  Musculoskeletal: Normal range of motion.  Skin:    General: Skin is warm and dry.  Neurological:     Mental Status: She is alert and oriented to person, place, and time.  Psychiatric:        Mood and Affect: Mood normal.        Behavior: Behavior normal.     RECENT LABS AND TESTS: BMET    Component Value Date/Time   NA 142 12/03/2018 0745   NA 143 04/16/2018 1229   NA 141 03/03/2016 1226   K 3.8 12/03/2018 0745   K 3.5 03/03/2016 1226   CL 109 12/03/2018 0745   CO2 25 12/03/2018 0745   CO2 24 03/03/2016 1226   GLUCOSE 130 (H) 12/03/2018 0745   GLUCOSE 113 03/03/2016 1226   BUN 10 12/03/2018 0745   BUN 12 04/16/2018 1229   BUN 8.5 03/03/2016 1226   CREATININE 0.85 12/03/2018 0745   CREATININE 0.8 03/03/2016 1226   CALCIUM 9.0 12/03/2018 0745   CALCIUM 10.0 03/03/2016 1226   GFRNONAA >60 10/21/2018 1338   GFRAA >60 10/21/2018 1338   Lab Results  Component Value Date   HGBA1C 7.5 (H) 12/05/2018   HGBA1C 7.8 (H) 09/13/2018   HGBA1C 9.0 (H) 04/16/2018   HGBA1C 8.2 06/20/2017   HGBA1C 15.5 02/09/2017   Lab Results  Component Value Date   INSULIN 8.7 04/16/2018   CBC    Component Value Date/Time   WBC 12.6 (H) 10/21/2018 1338   RBC 4.46 10/21/2018 1338   HGB 13.4 10/21/2018 1338   HGB 15.0 04/16/2018 1229   HGB 13.8 03/03/2016 1226   HCT 41.7 10/21/2018 1338   HCT 45.0 04/16/2018 1229   HCT 41.1 03/03/2016 1226   PLT 346 10/21/2018 1338   PLT 346 03/03/2016 1226   MCV 93.5 10/21/2018 1338   MCV 91  04/16/2018 1229   MCV 91.7 03/03/2016 1226   MCH 30.0 10/21/2018 1338  MCHC 32.1 10/21/2018 1338   RDW 13.7 10/21/2018 1338   RDW 14.1 04/16/2018 1229   RDW 14.1 03/03/2016 1226   LYMPHSABS 4.3 (H) 10/21/2018 1338   LYMPHSABS 3.4 (H) 04/16/2018 1229   LYMPHSABS 2.8 03/03/2016 1226   MONOABS 0.5 10/21/2018 1338   MONOABS 0.3 03/03/2016 1226   EOSABS 0.2 10/21/2018 1338   EOSABS 0.2 04/16/2018 1229   BASOSABS 0.1 10/21/2018 1338   BASOSABS 0.1 04/16/2018 1229   BASOSABS 0.1 03/03/2016 1226   Iron/TIBC/Ferritin/ %Sat No results found for: IRON, TIBC, FERRITIN, IRONPCTSAT Lipid Panel     Component Value Date/Time   CHOL 178 12/03/2018 0745   CHOL 216 (H) 04/16/2018 1229   TRIG 260.0 (H) 12/03/2018 0745   HDL 29.70 (L) 12/03/2018 0745   HDL 43 04/16/2018 1229   CHOLHDL 6 12/03/2018 0745   VLDL 52.0 (H) 12/03/2018 0745   LDLCALC 126 (H) 04/16/2018 1229   LDLDIRECT 110.0 12/03/2018 0745   Hepatic Function Panel     Component Value Date/Time   PROT 7.1 12/03/2018 0745   PROT 7.8 04/16/2018 1229   PROT 8.2 03/03/2016 1226   ALBUMIN 3.7 12/03/2018 0745   ALBUMIN 4.4 04/16/2018 1229   ALBUMIN 3.7 03/03/2016 1226   AST 22 12/03/2018 0745   AST 72 (H) 03/03/2016 1226   ALT 13 12/03/2018 0745   ALT 46 03/03/2016 1226   ALKPHOS 66 12/03/2018 0745   ALKPHOS 118 03/03/2016 1226   BILITOT 0.4 12/03/2018 0745   BILITOT 0.4 04/16/2018 1229   BILITOT 0.61 03/03/2016 1226       OBESITY BEHAVIORAL INTERVENTION VISIT  Today's visit was # 9   Starting weight: 216 lbs Starting date: 04/16/2018 Today's weight : 208 lbs Today's date: 12/05/2018 Total lbs lost to date: 8    ASK: We discussed the diagnosis of obesity with Monica Clark today and Monica Clark agreed to give [place] permission to discuss obesity behavioral modification therapy today.  ASSESS: Monica Clark has the diagnosis of obesity and her BMI today is 36.85 Monica Clark is in the action stage of change   ADVISE: Monica Clark was  educated on the multiple health risks of obesity as well as the benefit of weight loss to improve her health. She was advised of the need for long term treatment and the importance of lifestyle modifications to improve her current health and to decrease her risk of future health problems.  AGREE: Multiple dietary modification options and treatment options were discussed and  Monica Clark agreed to follow the recommendations documented in the above note.  ARRANGE: Monica Clark was educated on the importance of frequent visits to treat obesity as outlined per CMS and USPSTF guidelines and agreed to schedule her next follow up appointment today.  I, Trixie Dredge, am acting as transcriptionist for Dennard Nip, MD I have reviewed the above documentation for accuracy and completeness, and I agree with the above. -Dennard Nip, MD

## 2018-12-12 ENCOUNTER — Other Ambulatory Visit: Payer: Self-pay | Admitting: Endocrinology

## 2018-12-26 ENCOUNTER — Ambulatory Visit (INDEPENDENT_AMBULATORY_CARE_PROVIDER_SITE_OTHER): Payer: BC Managed Care – PPO | Admitting: Family Medicine

## 2018-12-30 ENCOUNTER — Ambulatory Visit (INDEPENDENT_AMBULATORY_CARE_PROVIDER_SITE_OTHER): Payer: BC Managed Care – PPO | Admitting: Family Medicine

## 2018-12-30 ENCOUNTER — Encounter (INDEPENDENT_AMBULATORY_CARE_PROVIDER_SITE_OTHER): Payer: Self-pay | Admitting: Family Medicine

## 2018-12-30 ENCOUNTER — Other Ambulatory Visit: Payer: Self-pay

## 2018-12-30 VITALS — BP 114/72 | HR 69 | Temp 98.1°F | Ht 63.0 in | Wt 208.0 lb

## 2018-12-30 DIAGNOSIS — Z9189 Other specified personal risk factors, not elsewhere classified: Secondary | ICD-10-CM | POA: Diagnosis not present

## 2018-12-30 DIAGNOSIS — E559 Vitamin D deficiency, unspecified: Secondary | ICD-10-CM | POA: Diagnosis not present

## 2018-12-30 DIAGNOSIS — E119 Type 2 diabetes mellitus without complications: Secondary | ICD-10-CM

## 2018-12-30 DIAGNOSIS — Z6836 Body mass index (BMI) 36.0-36.9, adult: Secondary | ICD-10-CM

## 2018-12-30 MED ORDER — VITAMIN D (ERGOCALCIFEROL) 1.25 MG (50000 UNIT) PO CAPS: 50000.0000 [IU] | ORAL_CAPSULE | ORAL | 0 refills | Status: DC

## 2018-12-30 MED ORDER — RYBELSUS 7 MG PO TABS: 1.0000 | ORAL_TABLET | ORAL | 0 refills | Status: DC

## 2018-12-30 NOTE — Progress Notes (Signed)
Office: [phone]  /  Fax: [phone]   HPI:   Chief Complaint: OBESITY Monica Clark is here to discuss her progress with her obesity treatment plan. She is on the Category 2 plan and is following her eating plan approximately 85-90 % of the time. She states she is exercising 0 minutes 0 times per week. Monica Clark continues to maintain her weight, but is not able to lose weight on her Low Carbohydrate plan. She would like to change back to the Category 2 plan.  Her weight is 208 lb (94.3 kg) today and has not lost weight since her last visit. She has lost 8 lbs since starting treatment with [place].  Diabetes II Monica Clark has a diagnosis of diabetes type II. Monica Clark's A1c has improved from 9.0 to 7.5, but is not yet at goal. She states she is not checking her blood sugar at home. She denies nausea, vomiting, or hypoglycemia. She has been working on intensive lifestyle modifications including diet, exercise, and weight loss to help control her blood glucose levels.  At risk for cardiovascular disease Monica Clark is at a higher than average risk for cardiovascular disease due to obesity and diabetes II. She currently denies any chest pain.  Vitamin D Deficiency Monica Clark has a diagnosis of vitamin D deficiency. She is stable on prescription Vit D, level is improving but not yet at goal. She denies nausea, vomiting or muscle weakness.  ASSESSMENT AND PLAN:  Type 2 diabetes mellitus without complication, without long-term current use of insulin (HCC) - Plan: Semaglutide (RYBELSUS) 7 MG TABS  Vitamin D deficiency - Plan: Vitamin D, Ergocalciferol, (DRISDOL) 1.25 MG (50000 UT) CAPS capsule  At risk for heart disease  Class 2 severe obesity with serious comorbidity and body mass index (BMI) of 36.0 to 36.9 in adult, unspecified obesity type ([place])  PLAN:  Diabetes II Monica Clark has been given extensive diabetes education by myself today including ideal fasting and post-prandial blood glucose readings, individual ideal  Hgb A1c goals and hypoglycemia prevention. We discussed the importance of good blood sugar control to decrease the likelihood of diabetic complications such as nephropathy, neuropathy, limb loss, blindness, coronary artery disease, and death. We discussed the importance of intensive lifestyle modification including diet, exercise and weight loss as the first line treatment for diabetes. Monica Clark agrees to increase Rybelsus to 7 mg PO q AM #30 with no refills. Monica Clark agrees to follow up with our clinic in 2 weeks.   Cardiovascular risk counseling Monica Clark was given extended (15 minutes) coronary artery disease prevention counseling today. She is 63 y.o. female and has risk factors for heart disease including obesity and diabetes II. We discussed intensive lifestyle modifications today with an emphasis on specific weight loss instructions and strategies. Pt was also informed of the importance of increasing exercise and decreasing saturated fats to help prevent heart disease.  Vitamin D Deficiency Monica Clark was informed that low vitamin D levels contributes to fatigue and are associated with obesity, breast, and colon cancer. Monica Clark agrees to continue taking prescription Vit D 50,000 IU every week #4 and we will refill for 1 month. She will follow up for routine testing of vitamin D, at least 2-3 times per year. She was informed of the risk of over-replacement of vitamin D and agrees to not increase her dose unless she discusses this with [place] first. Monica Clark agrees to follow up with our clinic in 2 weeks.  Obesity Monica Clark is currently in the action stage of change. As such, her goal is to  continue with weight loss efforts She has agreed to change to follow the Category 2 plan Monica Clark has been instructed to work up to a goal of 150 minutes of combined cardio and strengthening exercise per week or increase walking to 5 minutes daily for weight loss and overall health benefits. We discussed the following Behavioral Modification  Strategies today: increasing lean protein intake, decreasing simple carbohydrates  and holiday eating strategies    Monica Clark has agreed to follow up with our clinic in 2 weeks. She was informed of the importance of frequent follow up visits to maximize her success with intensive lifestyle modifications for her multiple health conditions.  ALLERGIES: Allergies  Allergen Reactions  . Codeine Other (See Comments)    INSOMNIA    MEDICATIONS: Current Outpatient Medications on File Prior to Visit  Medication Sig Dispense Refill  . fenofibrate (TRICOR) 145 MG tablet TAKE 1 TABLET BY MOUTH EVERY DAY 90 tablet 1  . glimepiride (AMARYL) 4 MG tablet Take 4 mg by mouth daily with breakfast.    . glucose blood (ONE TOUCH TEST STRIPS) test strip Test two times a day 100 each 0  . Insulin Pen Needle 31G X 5 MM MISC Use once a day 100 each 1  . JARDIANCE 25 MG TABS tablet TAKE 1 TABLET BY MOUTH ONCE DAILY. DX:E11.65 30 tablet 1  . metFORMIN (GLUCOPHAGE-XR) 500 MG 24 hr tablet Take 4 tablets (2,000 mg total) by mouth daily with supper. (Patient taking differently: Take 1,000 mg by mouth daily with supper. Take 2 tabs by mouth once daily.) 120 tablet 3  . tamoxifen (NOLVADEX) 20 MG tablet Take 1 tablet (20 mg total) by mouth daily. 90 tablet 3  . triamterene-hydrochlorothiazide (MAXZIDE-25) 37.5-25 MG per tablet Take 1 tablet by mouth every morning. Reported on 09/03/2015     No current facility-administered medications on file prior to visit.     PAST MEDICAL HISTORY: Past Medical History:  Diagnosis Date  . Breast mass, left    duct mass  . Depression   . Endometrial hyperplasia   . Fuchs' corneal dystrophy   . Gallbladder problem   . Heart murmur    states no known problems, has never seen a cardiologist  . Hypertension    states under control with med., has been on med. x 5 yr.  . Immature cataract   . Lower extremity edema   . 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 Nipple discharge  04/2014   left breast  . Non-insulin dependent type 2 diabetes mellitus ([place])     PAST SURGICAL HISTORY: Past Surgical History:  Procedure Laterality Date  . BREAST BIOPSY Left 04/30/2014   Procedure: LEFT BREAST BIOPSY;  Surgeon: Jackolyn Confer, MD;  Location: [facility];  Service: General;  Laterality: Left;  . BREAST DUCTAL SYSTEM EXCISION Left 04/30/2014   Procedure: LEFT NIPPLE DUCT EXCISION;  Surgeon: Jackolyn Confer, MD;  Location: [facility];  Service: General;  Laterality: Left;  . [place]  . CHOLECYSTECTOMY  1989  . ROBOTIC ASSISTED TOTAL HYSTERECTOMY WITH BILATERAL SALPINGO OOPHERECTOMY Bilateral 10/13/2014   Procedure: ROBOTIC ASSISTED TOTAL HYSTERECTOMY WITH BILATERAL SALPINGO OOPHORECTOMY ;  Surgeon: Everitt Amber, MD;  Location: [facility];  Service: Gynecology;  Laterality: Bilateral;    SOCIAL HISTORY: Social History   Tobacco Use  . Smoking status: Never Smoker  . Smokeless tobacco: Never Used  Substance Use Topics  . Alcohol use: Yes    Comment: rarely  .  Drug use: No    FAMILY HISTORY: Family History  Problem Relation Age of Onset  . Diabetes Mother   . Obesity Mother   . Heart disease Father   . Cancer Father        throat cancer   . Sudden death Father   . Alcoholism Father   . Diabetes Sister   . Diabetes Brother     ROS: Review of Systems  Constitutional: Negative for weight loss.  Cardiovascular: Negative for chest pain.  Gastrointestinal: Negative for nausea and vomiting.  Musculoskeletal:       Negative muscle weakness  Endo/Heme/Allergies:       Negative hypoglycemia    PHYSICAL EXAM: Blood pressure 114/72, pulse 69, temperature 98.1 F (36.7 C), temperature source Oral, height 5\' 3"  (1.6 m), weight 208 lb (94.3 kg), SpO2 98 %. Body mass index is 36.85 kg/m. Physical Exam Vitals signs reviewed.  Constitutional:      Appearance: Normal appearance. She is obese.  Cardiovascular:     Rate and  Rhythm: Normal rate.     Pulses: Normal pulses.  Pulmonary:     Effort: Pulmonary effort is normal.     Breath sounds: Normal breath sounds.  Musculoskeletal: Normal range of motion.  Skin:    General: Skin is warm and dry.  Neurological:     Mental Status: She is alert and oriented to person, place, and time.  Psychiatric:        Mood and Affect: Mood normal.        Behavior: Behavior normal.     RECENT LABS AND TESTS: BMET    Component Value Date/Time   NA 142 12/03/2018 0745   NA 143 04/16/2018 1229   NA 141 03/03/2016 1226   K 3.8 12/03/2018 0745   K 3.5 03/03/2016 1226   CL 109 12/03/2018 0745   CO2 25 12/03/2018 0745   CO2 24 03/03/2016 1226   GLUCOSE 130 (H) 12/03/2018 0745   GLUCOSE 113 03/03/2016 1226   BUN 10 12/03/2018 0745   BUN 12 04/16/2018 1229   BUN 8.5 03/03/2016 1226   CREATININE 0.85 12/03/2018 0745   CREATININE 0.8 03/03/2016 1226   CALCIUM 9.0 12/03/2018 0745   CALCIUM 10.0 03/03/2016 1226   GFRNONAA >60 10/21/2018 1338   GFRAA >60 10/21/2018 1338   Lab Results  Component Value Date   HGBA1C 7.5 (H) 12/05/2018   HGBA1C 7.8 (H) 09/13/2018   HGBA1C 9.0 (H) 04/16/2018   HGBA1C 8.2 06/20/2017   HGBA1C 15.5 02/09/2017   Lab Results  Component Value Date   INSULIN 8.7 04/16/2018   CBC    Component Value Date/Time   WBC 12.6 (H) 10/21/2018 1338   RBC 4.46 10/21/2018 1338   HGB 13.4 10/21/2018 1338   HGB 15.0 04/16/2018 1229   HGB 13.8 03/03/2016 1226   HCT 41.7 10/21/2018 1338   HCT 45.0 04/16/2018 1229   HCT 41.1 03/03/2016 1226   PLT 346 10/21/2018 1338   PLT 346 03/03/2016 1226   MCV 93.5 10/21/2018 1338   MCV 91 04/16/2018 1229   MCV 91.7 03/03/2016 1226   MCH 30.0 10/21/2018 1338   MCHC 32.1 10/21/2018 1338   RDW 13.7 10/21/2018 1338   RDW 14.1 04/16/2018 1229   RDW 14.1 03/03/2016 1226   LYMPHSABS 4.3 (H) 10/21/2018 1338   LYMPHSABS 3.4 (H) 04/16/2018 1229   LYMPHSABS 2.8 03/03/2016 1226   MONOABS 0.5 10/21/2018 1338    MONOABS 0.3 03/03/2016 1226   EOSABS  0.2 10/21/2018 1338   EOSABS 0.2 04/16/2018 1229   BASOSABS 0.1 10/21/2018 1338   BASOSABS 0.1 04/16/2018 1229   BASOSABS 0.1 03/03/2016 1226   Iron/TIBC/Ferritin/ %Sat No results found for: IRON, TIBC, FERRITIN, IRONPCTSAT Lipid Panel     Component Value Date/Time   CHOL 178 12/03/2018 0745   CHOL 216 (H) 04/16/2018 1229   TRIG 260.0 (H) 12/03/2018 0745   HDL 29.70 (L) 12/03/2018 0745   HDL 43 04/16/2018 1229   CHOLHDL 6 12/03/2018 0745   VLDL 52.0 (H) 12/03/2018 0745   LDLCALC 126 (H) 04/16/2018 1229   LDLDIRECT 110.0 12/03/2018 0745   Hepatic Function Panel     Component Value Date/Time   PROT 7.1 12/03/2018 0745   PROT 7.8 04/16/2018 1229   PROT 8.2 03/03/2016 1226   ALBUMIN 3.7 12/03/2018 0745   ALBUMIN 4.4 04/16/2018 1229   ALBUMIN 3.7 03/03/2016 1226   AST 22 12/03/2018 0745   AST 72 (H) 03/03/2016 1226   ALT 13 12/03/2018 0745   ALT 46 03/03/2016 1226   ALKPHOS 66 12/03/2018 0745   ALKPHOS 118 03/03/2016 1226   BILITOT 0.4 12/03/2018 0745   BILITOT 0.4 04/16/2018 1229   BILITOT 0.61 03/03/2016 1226       OBESITY BEHAVIORAL INTERVENTION VISIT  Today's visit was # 10   Starting weight: 216 lbs Starting date: 04/16/2018 Today's weight : 208 lbs Today's date: 12/30/2018 Total lbs lost to date: 8    ASK: We discussed the diagnosis of obesity with Monica Clark today and Monica Clark agreed to give [place] permission to discuss obesity behavioral modification therapy today.  ASSESS: Monica Clark has the diagnosis of obesity and her BMI today is 36.85 Monica Clark is in the action stage of change   ADVISE: Monica Clark was educated on the multiple health risks of obesity as well as the benefit of weight loss to improve her health. She was advised of the need for long term treatment and the importance of lifestyle modifications to improve her current health and to decrease her risk of future health problems.  AGREE: Multiple dietary modification  options and treatment options were discussed and  Monica Clark agreed to follow the recommendations documented in the above note.  ARRANGE: Monica Clark was educated on the importance of frequent visits to treat obesity as outlined per CMS and USPSTF guidelines and agreed to schedule her next follow up appointment today.  I, Trixie Dredge, am acting as transcriptionist for Dennard Nip, MD  I have reviewed the above documentation for accuracy and completeness, and I agree with the above. -Dennard Nip, MD

## 2019-01-01 ENCOUNTER — Other Ambulatory Visit (INDEPENDENT_AMBULATORY_CARE_PROVIDER_SITE_OTHER): Payer: Self-pay | Admitting: Family Medicine

## 2019-01-01 DIAGNOSIS — E559 Vitamin D deficiency, unspecified: Secondary | ICD-10-CM

## 2019-01-20 ENCOUNTER — Other Ambulatory Visit: Payer: Self-pay

## 2019-01-20 ENCOUNTER — Telehealth (INDEPENDENT_AMBULATORY_CARE_PROVIDER_SITE_OTHER): Payer: BC Managed Care – PPO | Admitting: Family Medicine

## 2019-01-20 ENCOUNTER — Encounter (INDEPENDENT_AMBULATORY_CARE_PROVIDER_SITE_OTHER): Payer: Self-pay | Admitting: Family Medicine

## 2019-01-20 DIAGNOSIS — E119 Type 2 diabetes mellitus without complications: Secondary | ICD-10-CM

## 2019-01-20 DIAGNOSIS — Z6836 Body mass index (BMI) 36.0-36.9, adult: Secondary | ICD-10-CM | POA: Diagnosis not present

## 2019-01-20 NOTE — Progress Notes (Signed)
Office: [phone]  /  Fax: [phone] TeleHealth Visit:  Monica Clark has verbally consented to this TeleHealth visit today. The patient is located at home, the provider is located at the [facility] office. The participants in this visit include the listed provider and patient. The visit was conducted today via FaceTime.  HPI:   Chief Complaint: OBESITY Monica Clark is here to discuss her progress with her obesity treatment plan. She is on the Category 2 plan and is following her eating plan approximately 75-80% of the time. She states she is exercising on the treadmill 20-30 minutes 2-3 times per week. Monica Clark feels she did well maintaining her weight even over Thanksgiving. She has done better with not skipping meals and is ready to do better with meal planning.  We were unable to weigh the patient today for this TeleHealth visit. She feels as if she has maintained her weight since her last visit. She has lost 8 lbs since starting treatment with [place].  Diabetes II Monica Clark has a diagnosis of diabetes type II. Monica Clark states her fasting blood sugars are mostly in the low 100's. She is on no medications and diet and exercise. She denies any hypoglycemic episodes. She would like to eventually decrease the medications she is on while staying controlled. Last A1c was 7.5, which is improved but not yet at goal. She has been working on intensive lifestyle modifications including diet, exercise, and weight loss to help control her blood glucose levels.  ASSESSMENT AND PLAN:  Type 2 diabetes mellitus without complication, without long-term current use of insulin (HCC)  Class 2 severe obesity with serious comorbidity and body mass index (BMI) of 36.0 to 36.9 in adult, unspecified obesity type ([place])  PLAN:  Diabetes II Monica Clark has been given extensive diabetes education by myself today including ideal fasting and post-prandial blood glucose readings, individual ideal HgA1c goals  and  hypoglycemia prevention. We discussed the importance of good blood sugar control to decrease the likelihood of diabetic complications such as nephropathy, neuropathy, limb loss, blindness, coronary artery disease, and death. We discussed the importance of intensive lifestyle modification including diet, exercise and weight loss as the first line treatment for diabetes. Yamaris agrees to continue her diabetes medications as is and diet and exercise. We will recheck her labs at her next in-office visit.  I spent > than 50% of the 20 minute visit (16 minutes) on counseling as documented in the note.  Obesity Monica Clark is currently in the action stage of change. As such, her goal is to continue with weight loss efforts. She has agreed to follow the Category 2 plan. Monica Clark has been instructed to work up to a goal of 150 minutes of combined cardio and strengthening exercise per week for weight loss and overall health benefits. We discussed the following Behavioral Modification Strategies today: work on meal planning and easy cooking plans, and holiday eating strategies.  Monica Clark has agreed to follow-up with our clinic in 3 weeks. She was informed of the importance of frequent follow-up visits to maximize her success with intensive lifestyle modifications for her multiple health conditions.  ALLERGIES: Allergies  Allergen Reactions   Codeine Other (See Comments)    INSOMNIA    MEDICATIONS: Current Outpatient Medications on File Prior to Visit  Medication Sig Dispense Refill   fenofibrate (TRICOR) 145 MG tablet TAKE 1 TABLET BY MOUTH EVERY DAY 90 tablet 1   glimepiride (AMARYL) 4 MG tablet Take 4 mg by mouth daily with breakfast.  glucose blood (ONE TOUCH TEST STRIPS) test strip Test two times a day 100 each 0   Insulin Pen Needle 31G X 5 MM MISC Use once a day 100 each 1   JARDIANCE 25 MG TABS tablet TAKE 1 TABLET BY MOUTH ONCE DAILY. DX:E11.65 30 tablet 1   metFORMIN (GLUCOPHAGE-XR) 500 MG 24  hr tablet Take 4 tablets (2,000 mg total) by mouth daily with supper. (Patient taking differently: Take 1,000 mg by mouth daily with supper. Take 2 tabs by mouth once daily.) 120 tablet 3   Semaglutide (RYBELSUS) 7 MG TABS Take 1 tablet by mouth every morning. 30 tablet 0   tamoxifen (NOLVADEX) 20 MG tablet Take 1 tablet (20 mg total) by mouth daily. 90 tablet 3   triamterene-hydrochlorothiazide (MAXZIDE-25) 37.5-25 MG per tablet Take 1 tablet by mouth every morning. Reported on 09/03/2015     Vitamin D, Ergocalciferol, (DRISDOL) 1.25 MG (50000 UT) CAPS capsule Take 1 capsule (50,000 Units total) by mouth every 7 (seven) days. 4 capsule 0   No current facility-administered medications on file prior to visit.     PAST MEDICAL HISTORY: Past Medical History:  Diagnosis Date   Breast mass, left    duct mass   Depression    Endometrial hyperplasia    Fuchs' corneal dystrophy    Gallbladder problem    Heart murmur    states no known problems, has never seen a cardiologist   Hypertension    states under control with med., has been on med. x 5 yr.   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[place])    Nipple discharge 04/2014   left breast   Non-insulin dependent type 2 diabetes mellitus ([place])     PAST SURGICAL HISTORY: Past Surgical History:  Procedure Laterality Date   BREAST BIOPSY Left 04/30/2014   Procedure: LEFT BREAST BIOPSY;  Surgeon: Jackolyn Confer, MD;  Location: [facility];  Service: General;  Laterality: Left;   BREAST DUCTAL SYSTEM EXCISION Left 04/30/2014   Procedure: LEFT NIPPLE DUCT EXCISION;  Surgeon: Jackolyn Confer, MD;  Location: [facility];  Service: General;  Laterality: Left;   [place]   ROBOTIC ASSISTED TOTAL HYSTERECTOMY WITH BILATERAL SALPINGO OOPHERECTOMY Bilateral 10/13/2014   Procedure: ROBOTIC ASSISTED TOTAL HYSTERECTOMY WITH BILATERAL  SALPINGO OOPHORECTOMY ;  Surgeon: Everitt Amber, MD;  Location: [facility];  Service: Gynecology;  Laterality: Bilateral;    SOCIAL HISTORY: Social History   Tobacco Use   Smoking status: Never Smoker   Smokeless tobacco: Never Used  Substance Use Topics   Alcohol use: Yes    Comment: rarely   Drug use: No    FAMILY HISTORY: Family History  Problem Relation Age of Onset   Diabetes Mother    Obesity Mother    Heart disease Father    Cancer Father        throat cancer    Sudden death Father    Alcoholism Father    Diabetes Sister    Diabetes Brother    ROS: Review of Systems  Endo/Heme/Allergies:       Negative for hypoglycemia.   PHYSICAL EXAM: Pt in no acute distress  RECENT LABS AND TESTS: BMET    Component Value Date/Time   NA 142 12/03/2018 0745   NA 143 04/16/2018 1229   NA 141 03/03/2016 1226   K 3.8 12/03/2018 0745   K 3.5 03/03/2016  1226   CL 109 12/03/2018 0745   CO2 25 12/03/2018 0745   CO2 24 03/03/2016 1226   GLUCOSE 130 (H) 12/03/2018 0745   GLUCOSE 113 03/03/2016 1226   BUN 10 12/03/2018 0745   BUN 12 04/16/2018 1229   BUN 8.5 03/03/2016 1226   CREATININE 0.85 12/03/2018 0745   CREATININE 0.8 03/03/2016 1226   CALCIUM 9.0 12/03/2018 0745   CALCIUM 10.0 03/03/2016 1226   GFRNONAA >60 10/21/2018 1338   GFRAA >60 10/21/2018 1338   Lab Results  Component Value Date   HGBA1C 7.5 (H) 12/05/2018   HGBA1C 7.8 (H) 09/13/2018   HGBA1C 9.0 (H) 04/16/2018   HGBA1C 8.2 06/20/2017   HGBA1C 15.5 02/09/2017   Lab Results  Component Value Date   INSULIN 8.7 04/16/2018   CBC    Component Value Date/Time   WBC 12.6 (H) 10/21/2018 1338   RBC 4.46 10/21/2018 1338   HGB 13.4 10/21/2018 1338   HGB 15.0 04/16/2018 1229   HGB 13.8 03/03/2016 1226   HCT 41.7 10/21/2018 1338   HCT 45.0 04/16/2018 1229   HCT 41.1 03/03/2016 1226   PLT 346 10/21/2018 1338   PLT 346 03/03/2016 1226   MCV 93.5 10/21/2018 1338   MCV 91 04/16/2018 1229   MCV  91.7 03/03/2016 1226   MCH 30.0 10/21/2018 1338   MCHC 32.1 10/21/2018 1338   RDW 13.7 10/21/2018 1338   RDW 14.1 04/16/2018 1229   RDW 14.1 03/03/2016 1226   LYMPHSABS 4.3 (H) 10/21/2018 1338   LYMPHSABS 3.4 (H) 04/16/2018 1229   LYMPHSABS 2.8 03/03/2016 1226   MONOABS 0.5 10/21/2018 1338   MONOABS 0.3 03/03/2016 1226   EOSABS 0.2 10/21/2018 1338   EOSABS 0.2 04/16/2018 1229   BASOSABS 0.1 10/21/2018 1338   BASOSABS 0.1 04/16/2018 1229   BASOSABS 0.1 03/03/2016 1226   Iron/TIBC/Ferritin/ %Sat No results found for: IRON, TIBC, FERRITIN, IRONPCTSAT Lipid Panel     Component Value Date/Time   CHOL 178 12/03/2018 0745   CHOL 216 (H) 04/16/2018 1229   TRIG 260.0 (H) 12/03/2018 0745   HDL 29.70 (L) 12/03/2018 0745   HDL 43 04/16/2018 1229   CHOLHDL 6 12/03/2018 0745   VLDL 52.0 (H) 12/03/2018 0745   LDLCALC 126 (H) 04/16/2018 1229   LDLDIRECT 110.0 12/03/2018 0745   Hepatic Function Panel     Component Value Date/Time   PROT 7.1 12/03/2018 0745   PROT 7.8 04/16/2018 1229   PROT 8.2 03/03/2016 1226   ALBUMIN 3.7 12/03/2018 0745   ALBUMIN 4.4 04/16/2018 1229   ALBUMIN 3.7 03/03/2016 1226   AST 22 12/03/2018 0745   AST 72 (H) 03/03/2016 1226   ALT 13 12/03/2018 0745   ALT 46 03/03/2016 1226   ALKPHOS 66 12/03/2018 0745   ALKPHOS 118 03/03/2016 1226   BILITOT 0.4 12/03/2018 0745   BILITOT 0.4 04/16/2018 1229   BILITOT 0.61 03/03/2016 1226    Results for LENDORA, GERBITZ (MRN [id number]) as of 01/20/2019 10:42  Ref. Range 12/05/2018 11:30  Vitamin D, 25-Hydroxy Latest Ref Range: 30.0 - 100.0 ng/mL 30.7   I, Michaelene Song, am acting as Location manager for Dennard Nip, MD I have reviewed the above documentation for accuracy and completeness, and I agree with the above. -Dennard Nip, MD

## 2019-01-27 ENCOUNTER — Other Ambulatory Visit: Payer: Self-pay | Admitting: Endocrinology

## 2019-01-27 DIAGNOSIS — E1165 Type 2 diabetes mellitus with hyperglycemia: Secondary | ICD-10-CM

## 2019-01-28 ENCOUNTER — Other Ambulatory Visit (INDEPENDENT_AMBULATORY_CARE_PROVIDER_SITE_OTHER): Payer: Self-pay | Admitting: Family Medicine

## 2019-01-28 DIAGNOSIS — E559 Vitamin D deficiency, unspecified: Secondary | ICD-10-CM

## 2019-02-04 ENCOUNTER — Other Ambulatory Visit: Payer: Self-pay | Admitting: Endocrinology

## 2019-02-04 DIAGNOSIS — E1165 Type 2 diabetes mellitus with hyperglycemia: Secondary | ICD-10-CM

## 2019-02-10 ENCOUNTER — Ambulatory Visit (INDEPENDENT_AMBULATORY_CARE_PROVIDER_SITE_OTHER): Payer: BC Managed Care – PPO | Admitting: Family Medicine

## 2019-02-11 ENCOUNTER — Ambulatory Visit (INDEPENDENT_AMBULATORY_CARE_PROVIDER_SITE_OTHER): Payer: BC Managed Care – PPO | Admitting: Family Medicine

## 2019-02-11 ENCOUNTER — Other Ambulatory Visit (INDEPENDENT_AMBULATORY_CARE_PROVIDER_SITE_OTHER): Payer: Self-pay | Admitting: Family Medicine

## 2019-02-11 ENCOUNTER — Encounter (INDEPENDENT_AMBULATORY_CARE_PROVIDER_SITE_OTHER): Payer: Self-pay | Admitting: Family Medicine

## 2019-02-11 ENCOUNTER — Other Ambulatory Visit: Payer: Self-pay

## 2019-02-11 VITALS — BP 125/77 | HR 75 | Temp 98.1°F | Ht 63.0 in | Wt 205.0 lb

## 2019-02-11 DIAGNOSIS — Z9189 Other specified personal risk factors, not elsewhere classified: Secondary | ICD-10-CM

## 2019-02-11 DIAGNOSIS — Z6836 Body mass index (BMI) 36.0-36.9, adult: Secondary | ICD-10-CM

## 2019-02-11 DIAGNOSIS — E119 Type 2 diabetes mellitus without complications: Secondary | ICD-10-CM

## 2019-02-11 DIAGNOSIS — Z794 Long term (current) use of insulin: Secondary | ICD-10-CM

## 2019-02-11 DIAGNOSIS — E559 Vitamin D deficiency, unspecified: Secondary | ICD-10-CM

## 2019-02-11 MED ORDER — RYBELSUS 7 MG PO TABS: 1.0000 | ORAL_TABLET | ORAL | 0 refills | Status: DC

## 2019-02-11 MED ORDER — VITAMIN D (ERGOCALCIFEROL) 1.25 MG (50000 UNIT) PO CAPS: 50000.0000 [IU] | ORAL_CAPSULE | ORAL | 0 refills | Status: DC

## 2019-02-11 MED ORDER — GLUCOSE BLOOD VI STRP: ORAL_STRIP | 0 refills | Status: DC

## 2019-02-11 NOTE — Progress Notes (Signed)
Office: [phone]  /  Fax: [phone]   HPI:  Chief Complaint: OBESITY Monica Clark is here to discuss her progress with her obesity treatment plan. She is on the Category 2 plan and states she is following her eating plan approximately 80% of the time. She states she is exercising on the treadmill 30 minutes 2 times per week.  Monica Clark continues to do well with diet and weight loss. Hunger is controlled, but she is worried about her 2 weeks off over the holidays and increased boredom snacking.  Today's visit was #12 Starting weight: 216 lbs Starting date: 04/16/2018 Today's weight: 205 lbs  Today's date: 02/11/2019 Total lbs lost to date: 11 Total lbs lost since last in-office visit: 3   Diabetes Mellitus Type II Monica Clark has a diagnosis of diabetes mellitus type II. She states blood sugars have started to creep up in the last 1-2 weeks, now at 150's, which is up from 120's previously. She has increased higher sugar fruits and feels this may be the cause.  Vitamin D deficiency Monica Clark has a diagnosis of Vitamin D deficiency and she is stable on prescription Vitamin D. Vitamin D level is not yet at goal (30.7 on 12/05/2018). No nausea, vomiting, or muscle weakness.  At risk for osteopenia and osteoporosis Monica Clark is at higher risk of osteopenia and osteoporosis due to Vitamin D deficiency.   ASSESSMENT AND PLAN:  Type 2 diabetes mellitus without complication, with long-term current use of insulin (HCC) - Plan: Semaglutide (RYBELSUS) 7 MG TABS, glucose blood (ONE TOUCH TEST STRIPS) test strip  Vitamin D deficiency - Plan: Vitamin D, Ergocalciferol, (DRISDOL) 1.25 MG (50000 UT) CAPS capsule  At risk for osteoporosis  Class 2 severe obesity with serious comorbidity and body mass index (BMI) of 36.0 to 36.9 in adult, unspecified obesity type ([place])  PLAN:  Diabetes II Monica Clark has been given diabetes education by myself today. Good blood sugar control is important to decrease the likelihood of  diabetic complications such as nephropathy, neuropathy, limb loss, blindness, coronary artery disease, and death. Intensive lifestyle modification including diet, exercise and weight loss were discussed as the first line treatment for diabetes. Eily was given a refill on her Rybelsus and test strips. We discussed lower sugar fruit choices. She will follow-up as directed.  Vitamin D Deficiency Monica Clark was informed that low Vitamin D levels contributes to fatigue and are associated with obesity, breast, and colon cancer. She agrees to continue to take prescription Vit D @ 50,000 IU every week #4 with 0 refills and will follow-up for routine testing of Vitamin D, at least 2-3 times per year. She was informed of the risk of over-replacement of Vitamin D and agrees to not increase her dose unless she discusses this with [place] first. Monica Clark agrees to follow-up with our clinic in 3 weeks.  At risk for osteopenia and osteoporosis Monica Clark was given extended  (15 minutes) osteoporosis prevention counseling today. Monica Clark is at risk for osteopenia and osteoporosis due to her Vitamin D deficiency. She was encouraged to take her Vitamin D and follow her higher calcium diet and increase strengthening exercise to help strengthen her bones and decrease her risk of osteopenia and osteoporosis.  Obesity Monica Clark is currently in the action stage of change. As such, her goal is to continue with weight loss efforts. She has agreed to follow the Category 2 plan. We discussed lower sugar coffee ideas so she avoids going to higher sugar coffee drinks at [place], etc. Monica Clark has been instructed  to work up to a goal of 150 minutes of combined cardio and strengthening exercise per week for weight loss and overall health benefits. We discussed the following Behavioral Modification Strategies today: holiday eating strategies and celebration eating strategies.  Monica Clark has agreed to follow-up with our clinic in 3 weeks. She was informed of  the importance of frequent follow-up visits to maximize her success with intensive lifestyle modifications for her multiple health conditions.  ALLERGIES: Allergies  Allergen Reactions  . Codeine Other (See Comments)    INSOMNIA    MEDICATIONS: Current Outpatient Medications on File Prior to Visit  Medication Sig Dispense Refill  . fenofibrate (TRICOR) 145 MG tablet TAKE 1 TABLET BY MOUTH EVERY DAY 90 tablet 1  . glimepiride (AMARYL) 4 MG tablet Take 4 mg by mouth daily with breakfast.    . Insulin Pen Needle 31G X 5 MM MISC Use once a day 100 each 1  . JARDIANCE 25 MG TABS tablet TAKE 1 TABLET BY MOUTH ONCE DAILY. DX:E11.65 30 tablet 1  . metFORMIN (GLUCOPHAGE-XR) 500 MG 24 hr tablet Take 4 tablets (2,000 mg total) by mouth daily with supper. (Patient taking differently: Take 1,000 mg by mouth daily with supper. Take 2 tabs by mouth once daily.) 120 tablet 3  . SOLIQUA 100-33 UNT-MCG/ML SOPN INJECT 30 UNITS INTO THE SKIN DAILY. 15 mL 2  . tamoxifen (NOLVADEX) 20 MG tablet Take 1 tablet (20 mg total) by mouth daily. 90 tablet 3  . triamterene-hydrochlorothiazide (MAXZIDE-25) 37.5-25 MG per tablet Take 1 tablet by mouth every morning. Reported on 09/03/2015     No current facility-administered medications on file prior to visit.    PAST MEDICAL HISTORY: Past Medical History:  Diagnosis Date  . Breast mass, left    duct mass  . Depression   . Endometrial hyperplasia   . Fuchs' corneal dystrophy   . Gallbladder problem   . Heart murmur    states no known problems, has never seen a cardiologist  . Hypertension    states under control with med., has been on med. x 5 yr.  . Immature cataract   . Lower extremity edema   . 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 Nipple discharge 04/2014   left breast  . Non-insulin dependent type 2 diabetes mellitus ([place])     PAST SURGICAL HISTORY: Past Surgical History:  Procedure Laterality Date  . BREAST BIOPSY Left 04/30/2014    Procedure: LEFT BREAST BIOPSY;  Surgeon: Jackolyn Confer, MD;  Location: [facility];  Service: General;  Laterality: Left;  . BREAST DUCTAL SYSTEM EXCISION Left 04/30/2014   Procedure: LEFT NIPPLE DUCT EXCISION;  Surgeon: Jackolyn Confer, MD;  Location: [facility];  Service: General;  Laterality: Left;  . [place]  . CHOLECYSTECTOMY  1989  . ROBOTIC ASSISTED TOTAL HYSTERECTOMY WITH BILATERAL SALPINGO OOPHERECTOMY Bilateral 10/13/2014   Procedure: ROBOTIC ASSISTED TOTAL HYSTERECTOMY WITH BILATERAL SALPINGO OOPHORECTOMY ;  Surgeon: Everitt Amber, MD;  Location: [facility];  Service: Gynecology;  Laterality: Bilateral;    SOCIAL HISTORY: Social History   Tobacco Use  . Smoking status: Never Smoker  . Smokeless tobacco: Never Used  Substance Use Topics  . Alcohol use: Yes    Comment: rarely  . Drug use: No    FAMILY HISTORY: Family History  Problem Relation Age of Onset  . Diabetes Mother   . Obesity Mother   . Heart disease Father   . Cancer Father  throat cancer   . Sudden death Father   . Alcoholism Father   . Diabetes Sister   . Diabetes Brother    ROS: Review of Systems  Constitutional: Positive for weight loss.  Gastrointestinal: Negative for nausea and vomiting.  Musculoskeletal:       Negative for muscle weakness.   PHYSICAL EXAM: Blood pressure 125/77, pulse 75, temperature 98.1 F (36.7 C), temperature source Oral, height 5\' 3"  (1.6 m), weight 205 lb (93 kg), SpO2 98 %. Body mass index is 36.31 kg/m. Physical Exam Vitals reviewed.  Constitutional:      Appearance: Normal appearance. She is obese.  Cardiovascular:     Rate and Rhythm: Normal rate.     Pulses: Normal pulses.  Pulmonary:     Effort: Pulmonary effort is normal.     Breath sounds: Normal breath sounds.  Musculoskeletal:        General: Normal range of motion.  Skin:    General: Skin is warm and dry.  Neurological:     Mental Status: She is alert  and oriented to person, place, and time.  Psychiatric:        Behavior: Behavior normal.   RECENT LABS AND TESTS: BMET    Component Value Date/Time   NA 142 12/03/2018 0745   NA 143 04/16/2018 1229   NA 141 03/03/2016 1226   K 3.8 12/03/2018 0745   K 3.5 03/03/2016 1226   CL 109 12/03/2018 0745   CO2 25 12/03/2018 0745   CO2 24 03/03/2016 1226   GLUCOSE 130 (H) 12/03/2018 0745   GLUCOSE 113 03/03/2016 1226   BUN 10 12/03/2018 0745   BUN 12 04/16/2018 1229   BUN 8.5 03/03/2016 1226   CREATININE 0.85 12/03/2018 0745   CREATININE 0.8 03/03/2016 1226   CALCIUM 9.0 12/03/2018 0745   CALCIUM 10.0 03/03/2016 1226   GFRNONAA >60 10/21/2018 1338   GFRAA >60 10/21/2018 1338   Lab Results  Component Value Date   HGBA1C 7.5 (H) 12/05/2018   HGBA1C 7.8 (H) 09/13/2018   HGBA1C 9.0 (H) 04/16/2018   HGBA1C 8.2 06/20/2017   HGBA1C 15.5 02/09/2017   Lab Results  Component Value Date   INSULIN 8.7 04/16/2018   CBC    Component Value Date/Time   WBC 12.6 (H) 10/21/2018 1338   RBC 4.46 10/21/2018 1338   HGB 13.4 10/21/2018 1338   HGB 15.0 04/16/2018 1229   HGB 13.8 03/03/2016 1226   HCT 41.7 10/21/2018 1338   HCT 45.0 04/16/2018 1229   HCT 41.1 03/03/2016 1226   PLT 346 10/21/2018 1338   PLT 346 03/03/2016 1226   MCV 93.5 10/21/2018 1338   MCV 91 04/16/2018 1229   MCV 91.7 03/03/2016 1226   MCH 30.0 10/21/2018 1338   MCHC 32.1 10/21/2018 1338   RDW 13.7 10/21/2018 1338   RDW 14.1 04/16/2018 1229   RDW 14.1 03/03/2016 1226   LYMPHSABS 4.3 (H) 10/21/2018 1338   LYMPHSABS 3.4 (H) 04/16/2018 1229   LYMPHSABS 2.8 03/03/2016 1226   MONOABS 0.5 10/21/2018 1338   MONOABS 0.3 03/03/2016 1226   EOSABS 0.2 10/21/2018 1338   EOSABS 0.2 04/16/2018 1229   BASOSABS 0.1 10/21/2018 1338   BASOSABS 0.1 04/16/2018 1229   BASOSABS 0.1 03/03/2016 1226   Iron/TIBC/Ferritin/ %Sat No results found for: IRON, TIBC, FERRITIN, IRONPCTSAT Lipid Panel     Component Value Date/Time   CHOL  178 12/03/2018 0745   CHOL 216 (H) 04/16/2018 1229   TRIG 260.0 (H) 12/03/2018  0745   HDL 29.70 (L) 12/03/2018 0745   HDL 43 04/16/2018 1229   CHOLHDL 6 12/03/2018 0745   VLDL 52.0 (H) 12/03/2018 0745   LDLCALC 126 (H) 04/16/2018 1229   LDLDIRECT 110.0 12/03/2018 0745   Hepatic Function Panel     Component Value Date/Time   PROT 7.1 12/03/2018 0745   PROT 7.8 04/16/2018 1229   PROT 8.2 03/03/2016 1226   ALBUMIN 3.7 12/03/2018 0745   ALBUMIN 4.4 04/16/2018 1229   ALBUMIN 3.7 03/03/2016 1226   AST 22 12/03/2018 0745   AST 72 (H) 03/03/2016 1226   ALT 13 12/03/2018 0745   ALT 46 03/03/2016 1226   ALKPHOS 66 12/03/2018 0745   ALKPHOS 118 03/03/2016 1226   BILITOT 0.4 12/03/2018 0745   BILITOT 0.4 04/16/2018 1229   BILITOT 0.61 03/03/2016 1226      I, Michaelene Song, am acting as Location manager for Dennard Nip, MD I have reviewed the above documentation for accuracy and completeness, and I agree with the above. -Dennard Nip, MD

## 2019-03-05 ENCOUNTER — Other Ambulatory Visit: Payer: Self-pay

## 2019-03-05 ENCOUNTER — Encounter (INDEPENDENT_AMBULATORY_CARE_PROVIDER_SITE_OTHER): Payer: Self-pay | Admitting: Family Medicine

## 2019-03-05 ENCOUNTER — Ambulatory Visit (INDEPENDENT_AMBULATORY_CARE_PROVIDER_SITE_OTHER): Payer: BC Managed Care – PPO | Admitting: Family Medicine

## 2019-03-05 VITALS — BP 143/79 | HR 76 | Temp 98.8°F | Ht 63.0 in | Wt 208.0 lb

## 2019-03-05 DIAGNOSIS — I10 Essential (primary) hypertension: Secondary | ICD-10-CM | POA: Diagnosis not present

## 2019-03-05 DIAGNOSIS — Z9189 Other specified personal risk factors, not elsewhere classified: Secondary | ICD-10-CM | POA: Diagnosis not present

## 2019-03-05 DIAGNOSIS — E119 Type 2 diabetes mellitus without complications: Secondary | ICD-10-CM | POA: Diagnosis not present

## 2019-03-05 DIAGNOSIS — Z794 Long term (current) use of insulin: Secondary | ICD-10-CM

## 2019-03-05 DIAGNOSIS — E559 Vitamin D deficiency, unspecified: Secondary | ICD-10-CM | POA: Diagnosis not present

## 2019-03-05 DIAGNOSIS — Z6836 Body mass index (BMI) 36.0-36.9, adult: Secondary | ICD-10-CM

## 2019-03-05 MED ORDER — VITAMIN D (ERGOCALCIFEROL) 1.25 MG (50000 UNIT) PO CAPS: 50000.0000 [IU] | ORAL_CAPSULE | ORAL | 0 refills | Status: DC

## 2019-03-05 MED ORDER — RYBELSUS 7 MG PO TABS: 1.0000 | ORAL_TABLET | ORAL | 0 refills | Status: DC

## 2019-03-05 MED ORDER — GLUCOSE BLOOD VI STRP: ORAL_STRIP | 0 refills | Status: DC

## 2019-03-09 ENCOUNTER — Other Ambulatory Visit (INDEPENDENT_AMBULATORY_CARE_PROVIDER_SITE_OTHER): Payer: Self-pay | Admitting: Family Medicine

## 2019-03-09 DIAGNOSIS — E559 Vitamin D deficiency, unspecified: Secondary | ICD-10-CM

## 2019-03-10 NOTE — Progress Notes (Signed)
Chief Complaint:   OBESITY Monica Clark is here to discuss her progress with her obesity treatment plan along with follow-up of her obesity related diagnoses. Monica Clark is on the Category 2 Plan and states she is following her eating plan approximately 70% of the time. Monica Clark states she is on the treadmill for 20 minutes 2 times per week.  Today's visit was #: 35 Starting weight: 216 lbs Starting date: 04/16/2018 Today's weight: 208 lbs Today's date: 03/05/2019 Total lbs lost to date: 8 Total lbs lost since last in-office visit: 0  Interim History: Monica Clark did some celebration eating over the holidays. She states holiday food is now out of the home, and she is ready to get back on track.  Subjective:   1. Type 2 diabetes mellitus without complication, with long-term current use of insulin (HCC) Monica Clark is working on diet, and is tolerating Rybelsus well. She denies hypoglycemia. She did not bring in her blood sugar log today.  2. Essential hypertension Monica Clark's blood pressure is elevated today. She states she was stressed due to traffic. Her blood pressure is normally well controlled.  3. Vitamin D deficiency Monica Clark is stable on Vit D, and she denies nausea, vomiting, or muscle weakness.  4. At risk for heart disease Monica Clark is at a higher than average risk for cardiovascular disease due to obesity. Reviewed: no chest pain on exertion, no dyspnea on exertion, and no swelling of ankles.  Assessment/Plan:   1. Type 2 diabetes mellitus without complication, with long-term current use of insulin (HCC) Good blood sugar control is important to decrease the likelihood of diabetic complications such as nephropathy, neuropathy, limb loss, blindness, coronary artery disease, and death. Intensive lifestyle modification including diet, exercise and weight loss are the first line of treatment for diabetes. We will refill Rybelsus and one touch ultra strips for 1 month. We will continue to monitor.  - glucose  blood (ONE TOUCH TEST STRIPS) test strip; Test two times a day  Dispense: 100 each; Refill: 0 - Semaglutide (RYBELSUS) 7 MG TABS; Take 1 tablet by mouth every morning.  Dispense: 30 tablet; Refill: 0  2. Essential hypertension Monica Clark is working on healthy weight loss, diet, and exercise to improve blood pressure control. We will watch for signs of hypotension as she continues her lifestyle modifications. We will recheck her blood pressure in 3 weeks.  3. Vitamin D deficiency Low Vitamin D level contributes to fatigue and are associated with obesity, breast, and colon cancer. We will refill prescription Vit D for 1 month. She will follow-up for routine testing of Vitamin D, at least 2-3 times per year to avoid over-replacement. We will continue to monitor.  - Vitamin D, Ergocalciferol, (DRISDOL) 1.25 MG (50000 UNIT) CAPS capsule; Take 1 capsule (50,000 Units total) by mouth every 7 (seven) days.  Dispense: 4 capsule; Refill: 0  4. At risk for heart disease Monica Clark was given approximately 15 minutes of coronary artery disease prevention counseling today. She is 64 y.o. female and has risk factors for heart disease including obesity. We discussed intensive lifestyle modifications today with an emphasis on specific weight loss instructions and strategies.   5. Class 2 severe obesity with serious comorbidity and body mass index (BMI) of 36.0 to 36.9 in adult, unspecified obesity type [facility]) Monica Clark is currently in the action stage of change. As such, her goal is to continue with weight loss efforts. She has agreed to on the Category 2 Plan.    Behavioral modification strategies: meal  planning and cooking strategies.  Monica Clark has agreed to follow-up with our clinic in 3 weeks. She was informed of the importance of frequent follow-up visits to maximize her success with intensive lifestyle modifications for her multiple health conditions.   Objective:   Blood pressure (!) 143/79, pulse 76, temperature 98.8  F (37.1 C), temperature source Oral, height 5\' 3"  (1.6 m), weight 208 lb (94.3 kg), SpO2 99 %. Body mass index is 36.85 kg/m.  General: Cooperative, alert, well developed, in no acute distress. HEENT: Conjunctivae and lids unremarkable. Cardiovascular: Regular rhythm.  Lungs: Normal work of breathing. Neurologic: No focal deficits.   Lab Results  Component Value Date   CREATININE 0.85 12/03/2018   BUN 10 12/03/2018   NA 142 12/03/2018   K 3.8 12/03/2018   CL 109 12/03/2018   CO2 25 12/03/2018   Lab Results  Component Value Date   ALT 13 12/03/2018   AST 22 12/03/2018   ALKPHOS 66 12/03/2018   BILITOT 0.4 12/03/2018   Lab Results  Component Value Date   HGBA1C 7.5 (H) 12/05/2018   HGBA1C 7.8 (H) 09/13/2018   HGBA1C 9.0 (H) 04/16/2018   HGBA1C 8.2 06/20/2017   HGBA1C 15.5 02/09/2017   Lab Results  Component Value Date   INSULIN 8.7 04/16/2018   Lab Results  Component Value Date   TSH 2.220 04/16/2018   Lab Results  Component Value Date   CHOL 178 12/03/2018   HDL 29.70 (L) 12/03/2018   LDLCALC 126 (H) 04/16/2018   LDLDIRECT 110.0 12/03/2018   TRIG 260.0 (H) 12/03/2018   CHOLHDL 6 12/03/2018   Lab Results  Component Value Date   WBC 12.6 (H) 10/21/2018   HGB 13.4 10/21/2018   HCT 41.7 10/21/2018   MCV 93.5 10/21/2018   PLT 346 10/21/2018   No results found for: IRON, TIBC, FERRITIN  Attestation Statements:   Reviewed by clinician on day of visit: allergies, medications, problem list, medical history, surgical history, family history, social history, and previous encounter notes.   I, Trixie Dredge, am acting as transcriptionist for Dennard Nip, MD.  I have reviewed the above documentation for accuracy and completeness, and I agree with the above. -  Dennard Nip, MD

## 2019-03-31 ENCOUNTER — Telehealth (INDEPENDENT_AMBULATORY_CARE_PROVIDER_SITE_OTHER): Payer: BC Managed Care – PPO | Admitting: Family Medicine

## 2019-03-31 ENCOUNTER — Encounter (INDEPENDENT_AMBULATORY_CARE_PROVIDER_SITE_OTHER): Payer: Self-pay | Admitting: Family Medicine

## 2019-03-31 ENCOUNTER — Other Ambulatory Visit: Payer: Self-pay

## 2019-03-31 DIAGNOSIS — U071 COVID-19: Secondary | ICD-10-CM

## 2019-03-31 DIAGNOSIS — Z6836 Body mass index (BMI) 36.0-36.9, adult: Secondary | ICD-10-CM

## 2019-04-01 NOTE — Progress Notes (Signed)
TeleHealth Visit:  Due to the COVID-19 pandemic, this visit was completed with telemedicine (audio/video) technology to reduce patient and provider exposure as well as to preserve personal protective equipment.   Shabreka has verbally consented to this TeleHealth visit. The patient is located at home, the provider is located at the [facility] office. The participants in this visit include the listed provider and patient. The visit was conducted today via [email or web address].   Chief Complaint: OBESITY Aliyonna is here to discuss her progress with her obesity treatment plan along with follow-up of her obesity related diagnoses. Audie is on the Category 2 Plan and states she is following her eating plan approximately 50% of the time. Iysis states she is doing 0 minutes 0 times per week.  Today's visit was #: 14 Starting weight: 216 lbs Starting date: 04/16/2018  Interim History: Brookelynn feels she has lost weight since her last visit. She was diagnosed with COVID19 in between our visits, and her appetite was very low for a while. Her appetite is starting to return although her sense of taste at is not right yet.  Subjective:   1. COVID-19 virus infection Lavra tested positive for COVID19 on 03/13/2019. She had significant shortness of breath and cough, fatigue, and loss of taste and smell. She is feeling better now, but she is still very fatigued and her taste is off.  Assessment/Plan:   1. COVID-19 virus infection Milenia was educated on immunity and the importance of continuous masking and social distancing, and will continue to follow up.  2. Class 2 severe obesity with serious comorbidity and body mass index (BMI) of 36.0 to 36.9 in adult, unspecified obesity type [facility]) Ferrel is currently in the action stage of change. As such, her goal is to continue with weight loss efforts. She has agreed to the Category 2 Plan.   Alyssamarie is to increase her water and start to increase her lean protein,  like eggs, string cheese, etc as tolerated. She will go back to Category 2 plan when she is fully recovered.  Behavioral modification strategies: increasing lean protein intake and increasing water intake.  Earnstine has agreed to follow-up with our clinic in 3 weeks with myself. She was informed of the importance of frequent follow-up visits to maximize her success with intensive lifestyle modifications for her multiple health conditions.  Objective:   VITALS: Per patient if applicable, see vitals. GENERAL: Alert and in no acute distress. CARDIOPULMONARY: No increased WOB. Speaking in clear sentences.  PSYCH: Pleasant and cooperative. Speech normal rate and rhythm. Affect is appropriate. Insight and judgement are appropriate. Attention is focused, linear, and appropriate.  NEURO: Oriented as arrived to appointment on time with no prompting.   Lab Results  Component Value Date   CREATININE 0.85 12/03/2018   BUN 10 12/03/2018   NA 142 12/03/2018   K 3.8 12/03/2018   CL 109 12/03/2018   CO2 25 12/03/2018   Lab Results  Component Value Date   ALT 13 12/03/2018   AST 22 12/03/2018   ALKPHOS 66 12/03/2018   BILITOT 0.4 12/03/2018   Lab Results  Component Value Date   HGBA1C 7.5 (H) 12/05/2018   HGBA1C 7.8 (H) 09/13/2018   HGBA1C 9.0 (H) 04/16/2018   HGBA1C 8.2 06/20/2017   HGBA1C 15.5 02/09/2017   Lab Results  Component Value Date   INSULIN 8.7 04/16/2018   Lab Results  Component Value Date   TSH 2.220 04/16/2018   Lab Results  Component  Value Date   CHOL 178 12/03/2018   HDL 29.70 (L) 12/03/2018   LDLCALC 126 (H) 04/16/2018   LDLDIRECT 110.0 12/03/2018   TRIG 260.0 (H) 12/03/2018   CHOLHDL 6 12/03/2018   Lab Results  Component Value Date   WBC 12.6 (H) 10/21/2018   HGB 13.4 10/21/2018   HCT 41.7 10/21/2018   MCV 93.5 10/21/2018   PLT 346 10/21/2018   No results found for: IRON, TIBC, FERRITIN  Attestation Statements:   Reviewed by clinician on day of visit:  allergies, medications, problem list, medical history, surgical history, family history, social history, and previous encounter notes.  Time spent on visit including pre-visit chart review and post-visit care was 30 minutes.    I, Trixie Dredge, am acting as transcriptionist for Dennard Nip, MD.  I have reviewed the above documentation for accuracy and completeness, and I agree with the above. - Dennard Nip, MD

## 2019-04-13 ENCOUNTER — Other Ambulatory Visit (INDEPENDENT_AMBULATORY_CARE_PROVIDER_SITE_OTHER): Payer: Self-pay | Admitting: Family Medicine

## 2019-04-13 DIAGNOSIS — E119 Type 2 diabetes mellitus without complications: Secondary | ICD-10-CM

## 2019-04-13 DIAGNOSIS — E559 Vitamin D deficiency, unspecified: Secondary | ICD-10-CM

## 2019-04-13 DIAGNOSIS — Z794 Long term (current) use of insulin: Secondary | ICD-10-CM

## 2019-04-17 ENCOUNTER — Other Ambulatory Visit: Payer: Self-pay | Admitting: Endocrinology

## 2019-04-17 DIAGNOSIS — E1165 Type 2 diabetes mellitus with hyperglycemia: Secondary | ICD-10-CM

## 2019-04-21 ENCOUNTER — Other Ambulatory Visit: Payer: Self-pay

## 2019-04-21 ENCOUNTER — Encounter (INDEPENDENT_AMBULATORY_CARE_PROVIDER_SITE_OTHER): Payer: Self-pay | Admitting: Family Medicine

## 2019-04-21 ENCOUNTER — Ambulatory Visit (INDEPENDENT_AMBULATORY_CARE_PROVIDER_SITE_OTHER): Payer: BC Managed Care – PPO | Admitting: Family Medicine

## 2019-04-21 VITALS — BP 142/81 | HR 68 | Temp 98.6°F | Ht 63.0 in | Wt 202.0 lb

## 2019-04-21 DIAGNOSIS — Z794 Long term (current) use of insulin: Secondary | ICD-10-CM | POA: Diagnosis not present

## 2019-04-21 DIAGNOSIS — U071 COVID-19: Secondary | ICD-10-CM

## 2019-04-21 DIAGNOSIS — Z6835 Body mass index (BMI) 35.0-35.9, adult: Secondary | ICD-10-CM

## 2019-04-21 DIAGNOSIS — E119 Type 2 diabetes mellitus without complications: Secondary | ICD-10-CM

## 2019-04-21 MED ORDER — RYBELSUS 14 MG PO TABS: 14.0000 mg | ORAL_TABLET | Freq: Every day | ORAL | 0 refills | Status: DC

## 2019-04-22 NOTE — Progress Notes (Signed)
Chief Complaint:   OBESITY Samamtha is here to discuss her progress with her obesity treatment plan along with follow-up of her obesity related diagnoses. Janaiyah is on the Category 2 Plan and states she is following her eating plan approximately 75% of the time. Rosebelle states she is doing 0 minutes 0 times per week.  Today's visit was #: 15 Starting weight: 216 lbs Starting date: 04/16/2018 Today's weight: 202 lbs Today's date: 04/21/2019 Total lbs lost to date: 14 Total lbs lost since last in-office visit: 6  Interim History: Kashima is doing well with weight loss. She had COVID19 infection since her last in office visit, but her appetite is coming back. She is still struggling with fatigue.  Subjective:   1. Type 2 diabetes mellitus without complication, with long-term current use of insulin (HCC) Nereida states her fasting BGs mostly range in 130's since her COVID19 infection, had been between 110 and 120's before the infection. She notes increased neuropathy.  2. COVID-19 virus infection Laziyah is feeling better but she still notes fatigue, and she has question about the COVID19 vaccine. She denies treatment with monoclonal antibiotic therapy.  Assessment/Plan:   1. Type 2 diabetes mellitus without complication, with long-term current use of insulin (HCC) Good blood sugar control is important to decrease the likelihood of diabetic complications such as nephropathy, neuropathy, limb loss, blindness, coronary artery disease, and death. Intensive lifestyle modification including diet, exercise and weight loss are the first line of treatment for diabetes. Deesha agreed to increased Rybelsus to 14 mg daily with no refill, and she will continue with diet and exercise and will follow up.  - Semaglutide (RYBELSUS) 14 MG TABS; Take 14 mg by mouth daily.  Dispense: 30 tablet; Refill: 0  2. COVID-19 virus infection Tywanda was educated on the COVID19 vaccine timing and to continue to mask and social  distance for now.  3. Class 2 severe obesity with serious comorbidity and body mass index (BMI) of 35.0 to 35.9 in adult, unspecified obesity type [facility]) Indee is currently in the action stage of change. As such, her goal is to continue with weight loss efforts. She has agreed to the Category 2 Plan.   Exercise goals: Arbor is to hold off on exercise until she has fully recovered.  Behavioral modification strategies: no skipping meals.  Kimmarie has agreed to follow-up with our clinic in 3 weeks. She was informed of the importance of frequent follow-up visits to maximize her success with intensive lifestyle modifications for her multiple health conditions.   Objective:   Blood pressure (!) 142/81, pulse 68, temperature 98.6 F (37 C), temperature source Oral, height 5\' 3"  (1.6 m), weight 202 lb (91.6 kg), SpO2 98 %. Body mass index is 35.78 kg/m.  General: Cooperative, alert, well developed, in no acute distress. HEENT: Conjunctivae and lids unremarkable. Cardiovascular: Regular rhythm.  Lungs: Normal work of breathing. Neurologic: No focal deficits.   Lab Results  Component Value Date   CREATININE 0.85 12/03/2018   BUN 10 12/03/2018   NA 142 12/03/2018   K 3.8 12/03/2018   CL 109 12/03/2018   CO2 25 12/03/2018   Lab Results  Component Value Date   ALT 13 12/03/2018   AST 22 12/03/2018   ALKPHOS 66 12/03/2018   BILITOT 0.4 12/03/2018   Lab Results  Component Value Date   HGBA1C 7.5 (H) 12/05/2018   HGBA1C 7.8 (H) 09/13/2018   HGBA1C 9.0 (H) 04/16/2018   HGBA1C 8.2 06/20/2017  HGBA1C 15.5 02/09/2017   Lab Results  Component Value Date   INSULIN 8.7 04/16/2018   Lab Results  Component Value Date   TSH 2.220 04/16/2018   Lab Results  Component Value Date   CHOL 178 12/03/2018   HDL 29.70 (L) 12/03/2018   LDLCALC 126 (H) 04/16/2018   LDLDIRECT 110.0 12/03/2018   TRIG 260.0 (H) 12/03/2018   CHOLHDL 6 12/03/2018   Lab Results  Component Value Date   WBC 12.6  (H) 10/21/2018   HGB 13.4 10/21/2018   HCT 41.7 10/21/2018   MCV 93.5 10/21/2018   PLT 346 10/21/2018   No results found for: IRON, TIBC, FERRITIN  Attestation Statements:   Reviewed by clinician on day of visit: allergies, medications, problem list, medical history, surgical history, family history, social history, and previous encounter notes.  Time spent on visit including pre-visit chart review and post-visit care and charting was 30 minutes.    I, Trixie Dredge, am acting as transcriptionist for Dennard Nip, MD.  I have reviewed the above documentation for accuracy and completeness, and I agree with the above. -  Dennard Nip, MD

## 2019-05-01 ENCOUNTER — Ambulatory Visit: Payer: BC Managed Care – PPO | Attending: Internal Medicine

## 2019-05-01 DIAGNOSIS — Z23 Encounter for immunization: Secondary | ICD-10-CM

## 2019-05-01 NOTE — Progress Notes (Signed)
   Covid-19 Vaccination Clinic  Name:  Monica Clark    MRN: [id number] DOB: 20-Oct-1955  05/01/2019  Monica Clark was observed post Covid-19 immunization for 15 minutes without incident. She was provided with Vaccine Information Sheet and instruction to access the V-Safe system.   Monica Clark was instructed to call 911 with any severe reactions post vaccine: Marland Kitchen Difficulty breathing  . Swelling of face and throat  . A fast heartbeat  . A bad rash all over body  . Dizziness and weakness   Immunizations Administered    Name Date Dose VIS Date Route   Pfizer COVID-19 Vaccine 05/01/2019  4:24 PM 0.3 mL 01/31/2019 Intramuscular   Manufacturer: [place]   Lot: [id number]   [place]: [id number]

## 2019-05-07 ENCOUNTER — Other Ambulatory Visit (INDEPENDENT_AMBULATORY_CARE_PROVIDER_SITE_OTHER): Payer: Self-pay | Admitting: Family Medicine

## 2019-05-07 DIAGNOSIS — E559 Vitamin D deficiency, unspecified: Secondary | ICD-10-CM

## 2019-05-07 DIAGNOSIS — Z794 Long term (current) use of insulin: Secondary | ICD-10-CM

## 2019-05-07 DIAGNOSIS — E119 Type 2 diabetes mellitus without complications: Secondary | ICD-10-CM

## 2019-05-19 ENCOUNTER — Ambulatory Visit (INDEPENDENT_AMBULATORY_CARE_PROVIDER_SITE_OTHER): Payer: BC Managed Care – PPO | Admitting: Family Medicine

## 2019-05-19 ENCOUNTER — Encounter (INDEPENDENT_AMBULATORY_CARE_PROVIDER_SITE_OTHER): Payer: Self-pay | Admitting: Family Medicine

## 2019-05-19 ENCOUNTER — Other Ambulatory Visit: Payer: Self-pay

## 2019-05-19 VITALS — BP 107/72 | HR 76 | Temp 98.0°F | Ht 63.0 in | Wt 197.0 lb

## 2019-05-19 DIAGNOSIS — Z6835 Body mass index (BMI) 35.0-35.9, adult: Secondary | ICD-10-CM

## 2019-05-19 DIAGNOSIS — E119 Type 2 diabetes mellitus without complications: Secondary | ICD-10-CM

## 2019-05-19 DIAGNOSIS — E559 Vitamin D deficiency, unspecified: Secondary | ICD-10-CM | POA: Diagnosis not present

## 2019-05-20 NOTE — Progress Notes (Signed)
Chief Complaint:   OBESITY Prentiss is here to discuss her progress with her obesity treatment plan along with follow-up of her obesity related diagnoses. Fayelynn is on the Category 2 Plan and states she is following her eating plan approximately 75% of the time. Starlight states she is on the treadmill for 20 minutes 2 times per week.  Today's visit was #: 22 Starting weight: 216 lbs Starting date: 04/16/2018 Today's weight: 197 lbs Today's date: 05/19/2019 Total lbs lost to date: 19 Total lbs lost since last in-office visit: 5  Interim History: Cierrah reports due to recent COVID-19 infection, her appetite has been dramatically reduced. She is unable to drink water, and unsweetened tea is only palpable liquid.  Subjective:   1. Type 2 diabetes mellitus without complication, without long-term current use of insulin ([place]) Shali's last A1c was 7.5 on 12/05/2018. She recently had her levels checked with Dr. Justin Mend at [place]. Per patient, her A1c was 6.4. She is on multiple anti-diabetic medications.  2. Vitamin D deficiency Kela's last Vit D level was 30.7 on 12/05/2018. She has been forgetting her weekly Vit D dose.  Assessment/Plan:   1. Type 2 diabetes mellitus without complication, without long-term current use of insulin (HCC) Good blood sugar control is important to decrease the likelihood of diabetic complications such as nephropathy, neuropathy, limb loss, blindness, coronary artery disease, and death. Intensive lifestyle modification including diet, exercise and weight loss are the first line of treatment for diabetes. We will recheck A1c in 3 months.  2. Vitamin D deficiency Low Vitamin D level contributes to fatigue and are associated with obesity, breast, and colon cancer. Rane agreed to consistently take prescription Vitamin D 50,000 IU every week and will follow-up for routine testing of Vitamin D, at least 2-3 times per year to avoid over-replacement.  3. Class 2 severe obesity  with serious comorbidity and body mass index (BMI) of 35.0 to 35.9 in adult, unspecified obesity type [facility]) Jaymee is currently in the action stage of change. As such, her goal is to continue with weight loss efforts. She has agreed to change to keeping a food journal and adhering to recommended goals of 1200-1500 calories and 80+ grams of protein daily.   Exercise goals: As is.  Behavioral modification strategies: increasing lean protein intake and no skipping meals.  Essica has agreed to follow-up with our clinic in 3 weeks. She was informed of the importance of frequent follow-up visits to maximize her success with intensive lifestyle modifications for her multiple health conditions.   Objective:   Blood pressure 107/72, pulse 76, temperature 98 F (36.7 C), temperature source Oral, height 5\' 3"  (1.6 m), weight 197 lb (89.4 kg), SpO2 98 %. Body mass index is 34.9 kg/m.  General: Cooperative, alert, well developed, in no acute distress. HEENT: Conjunctivae and lids unremarkable. Cardiovascular: Regular rhythm.  Lungs: Normal work of breathing. Neurologic: No focal deficits.   Lab Results  Component Value Date   CREATININE 0.85 12/03/2018   BUN 10 12/03/2018   NA 142 12/03/2018   K 3.8 12/03/2018   CL 109 12/03/2018   CO2 25 12/03/2018   Lab Results  Component Value Date   ALT 13 12/03/2018   AST 22 12/03/2018   ALKPHOS 66 12/03/2018   BILITOT 0.4 12/03/2018   Lab Results  Component Value Date   HGBA1C 7.5 (H) 12/05/2018   HGBA1C 7.8 (H) 09/13/2018   HGBA1C 9.0 (H) 04/16/2018   HGBA1C 8.2 06/20/2017  HGBA1C 15.5 02/09/2017   Lab Results  Component Value Date   INSULIN 8.7 04/16/2018   Lab Results  Component Value Date   TSH 2.220 04/16/2018   Lab Results  Component Value Date   CHOL 178 12/03/2018   HDL 29.70 (L) 12/03/2018   LDLCALC 126 (H) 04/16/2018   LDLDIRECT 110.0 12/03/2018   TRIG 260.0 (H) 12/03/2018   CHOLHDL 6 12/03/2018   Lab Results    Component Value Date   WBC 12.6 (H) 10/21/2018   HGB 13.4 10/21/2018   HCT 41.7 10/21/2018   MCV 93.5 10/21/2018   PLT 346 10/21/2018   No results found for: IRON, TIBC, FERRITIN  Attestation Statements:   Reviewed by clinician on day of visit: allergies, medications, problem list, medical history, surgical history, family history, social history, and previous encounter notes.  Time spent on visit including pre-visit chart review and post-visit care and charting was 23 minutes.    I, Trixie Dredge, am acting as transcriptionist for Dennard Nip, MD.  I have reviewed the above documentation for accuracy and completeness, and I agree with the above. -  Dennard Nip, MD

## 2019-05-21 ENCOUNTER — Other Ambulatory Visit (INDEPENDENT_AMBULATORY_CARE_PROVIDER_SITE_OTHER): Payer: Self-pay | Admitting: Family Medicine

## 2019-05-21 DIAGNOSIS — E119 Type 2 diabetes mellitus without complications: Secondary | ICD-10-CM

## 2019-05-21 DIAGNOSIS — Z794 Long term (current) use of insulin: Secondary | ICD-10-CM

## 2019-05-21 NOTE — Telephone Encounter (Signed)
I accidentally sent this incorrectly

## 2019-05-21 NOTE — Telephone Encounter (Signed)
Please advise 

## 2019-05-21 NOTE — Telephone Encounter (Signed)
Hi Ashleigh think this should have went to Dr. Leafy Ro team. Looks like she writes for patient.

## 2019-05-27 ENCOUNTER — Other Ambulatory Visit (INDEPENDENT_AMBULATORY_CARE_PROVIDER_SITE_OTHER): Payer: Self-pay | Admitting: Family Medicine

## 2019-05-27 DIAGNOSIS — E119 Type 2 diabetes mellitus without complications: Secondary | ICD-10-CM

## 2019-05-28 ENCOUNTER — Ambulatory Visit: Payer: BC Managed Care – PPO | Attending: Internal Medicine

## 2019-05-28 DIAGNOSIS — Z23 Encounter for immunization: Secondary | ICD-10-CM

## 2019-05-28 NOTE — Progress Notes (Signed)
   Covid-19 Vaccination Clinic  Name:  Monica Clark    MRN: [id number] DOB: 01-Jul-1955  05/28/2019  [name] was observed post Covid-19 immunization for 15 minutes without incident. She was provided with Vaccine Information Sheet and instruction to access the V-Safe system.   [name] was instructed to call 911 with any severe reactions post vaccine: Marland Kitchen Difficulty breathing  . Swelling of face and throat  . A fast heartbeat  . A bad rash all over body  . Dizziness and weakness   Immunizations Administered    Name Date Dose VIS Date Route   Pfizer COVID-19 Vaccine 05/28/2019  4:02 PM 0.3 mL 01/31/2019 Intramuscular   Manufacturer: [place]   Lot: [id number]   [place]: [id number]

## 2019-06-11 ENCOUNTER — Ambulatory Visit (INDEPENDENT_AMBULATORY_CARE_PROVIDER_SITE_OTHER): Payer: BC Managed Care – PPO | Admitting: Family Medicine

## 2019-06-24 ENCOUNTER — Ambulatory Visit (INDEPENDENT_AMBULATORY_CARE_PROVIDER_SITE_OTHER): Payer: BC Managed Care – PPO | Admitting: Family Medicine

## 2019-07-02 ENCOUNTER — Other Ambulatory Visit: Payer: Self-pay | Admitting: Endocrinology

## 2019-07-02 DIAGNOSIS — E1165 Type 2 diabetes mellitus with hyperglycemia: Secondary | ICD-10-CM

## 2019-07-25 ENCOUNTER — Other Ambulatory Visit: Payer: Self-pay | Admitting: Endocrinology

## 2019-07-25 DIAGNOSIS — E1165 Type 2 diabetes mellitus with hyperglycemia: Secondary | ICD-10-CM

## 2019-07-25 NOTE — Telephone Encounter (Signed)
Medication Refill Request  Did you call your pharmacy and request this refill first? Yes . If patient has not contacted pharmacy first, instruct them to do so for future refills.  . Remind them that contacting the pharmacy for their refill is the quickest method to get the refill.  . Refill policy also stated that it will take anywhere between 24-72 hours to receive the refill.    Name of medication?  SOLIQUA 100-33 UNT-MCG/ML SOPN   Is this a 90 day supply? Yes  Name and location of pharmacy?  [id number] [address], [place] Phone:  [phone]  Fax:  [phone]     Patient states she is completely out of the medication listed above and requests RX be sent asap   . Is the request for diabetes test strips? No . If yes, what brand? N/A

## 2019-07-28 ENCOUNTER — Other Ambulatory Visit (INDEPENDENT_AMBULATORY_CARE_PROVIDER_SITE_OTHER): Payer: BC Managed Care – PPO

## 2019-07-28 ENCOUNTER — Other Ambulatory Visit: Payer: Self-pay

## 2019-07-28 DIAGNOSIS — E1165 Type 2 diabetes mellitus with hyperglycemia: Secondary | ICD-10-CM | POA: Diagnosis not present

## 2019-07-28 LAB — BASIC METABOLIC PANEL
BUN: 13 mg/dL (ref 6–23)
CO2: 25 mEq/L (ref 19–32)
Calcium: 8.9 mg/dL (ref 8.4–10.5)
Chloride: 107 mEq/L (ref 96–112)
Creatinine, Ser: 0.86 mg/dL (ref 0.40–1.20)
GFR: 80.82 mL/min (ref >60.00)
Glucose, Bld: 150 mg/dL — ABNORMAL HIGH (ref 70–99)
Potassium: 3.6 mEq/L (ref 3.5–5.1)
Sodium: 139 mEq/L (ref 135–145)

## 2019-07-28 LAB — LDL CHOLESTEROL, DIRECT: Direct LDL: 121 mg/dL

## 2019-07-28 LAB — LIPID PANEL
Cholesterol: 196 mg/dL (ref 0–200)
HDL: 32.9 mg/dL — ABNORMAL LOW (ref >39.00)
NonHDL: 163.53
Total CHOL/HDL Ratio: 6
Triglycerides: 211 mg/dL — ABNORMAL HIGH (ref 0.0–149.0)
VLDL: 42.2 mg/dL — ABNORMAL HIGH (ref 0.0–40.0)

## 2019-07-29 ENCOUNTER — Other Ambulatory Visit: Payer: BC Managed Care – PPO

## 2019-07-29 LAB — FRUCTOSAMINE: Fructosamine: 236 umol/L (ref 0–285)

## 2019-08-05 ENCOUNTER — Ambulatory Visit: Payer: BC Managed Care – PPO | Admitting: Endocrinology

## 2019-08-05 ENCOUNTER — Other Ambulatory Visit: Payer: Self-pay

## 2019-08-05 ENCOUNTER — Encounter: Payer: Self-pay | Admitting: Endocrinology

## 2019-08-05 VITALS — BP 124/82 | HR 79 | Ht 63.0 in | Wt 211.8 lb

## 2019-08-05 DIAGNOSIS — E782 Mixed hyperlipidemia: Secondary | ICD-10-CM

## 2019-08-05 DIAGNOSIS — E1165 Type 2 diabetes mellitus with hyperglycemia: Secondary | ICD-10-CM

## 2019-08-05 LAB — POCT GLYCOSYLATED HEMOGLOBIN (HGB A1C): Hemoglobin A1C: 6.4 % — AB (ref 4.0–5.6)

## 2019-08-05 LAB — GLUCOSE, POCT (MANUAL RESULT ENTRY): POC Glucose: 129 mg/dl — AB (ref 70–99)

## 2019-08-05 MED ORDER — ROSUVASTATIN CALCIUM 5 MG PO TABS: 5.0000 mg | ORAL_TABLET | Freq: Every day | ORAL | 3 refills | Status: DC

## 2019-08-05 NOTE — Patient Instructions (Addendum)
Stop Metformin and Glimeperide  Check blood sugars on waking up 5 days a week  Also check blood sugars about 2 hours after meals and do this after different meals by rotation  Recommended blood sugar levels on waking up are 90-130 and about 2 hours after meal is 130-160  Please bring your blood sugar monitor to each visit, thank you  Stay on 30 Soliqua daily  Walk daily

## 2019-08-05 NOTE — Progress Notes (Signed)
Patient ID: Monica Clark, female   DOB: [birth year], 64 y.o.   MRN: [id number]           Reason for Appointment: Follow-up for Type 2 Diabetes  Referring physician: [name]    History of Present Illness:          Date of diagnosis of type 2 diabetes mellitus: 2010        Recent history:       Non-insulin hypoglycemic drugs: Metformin ER 2, Amaryl 4 mg daily,, Jardiance 25 mg daily, Rybelsus 14 mg daily SOLIQUA 30-35 units   Current management, blood sugar patterns and problems identified:  Her A1c is 6.4 compared to 7.5 last year   She has not come back for follow-up since 10/20  She has continued to take [place] even though on the last visit with taking Rybelsus she was having relatively better blood sugars and was not taking [place] regularly  Most likely she was much better with her diet at that time  She is mostly taking 30 units of Soliqua in the morning although not clear if she is able to remember to do this every day  However she is more regular with Rybelsus and this has been titrated to 14 mg  She does not get nausea with this as long as she is eating 30 minutes later  However now she is complaining of loose stool 3-4 times a day even with taking only 2 tablets of Metformin in the evening  Not motivated to exercise even though she has a treadmill for home use  Recently has gained back 14 pounds and at least for the last month has been poorly compliant with her diet and meal planning  She did not bring her monitor for download and again mostly checking fasting readings  She is recently trying to eat a smaller breakfast with toast, eggs and low-fat cheese and postprandial reading in the lab was 150       Side effects from medications have been: Diarrhea from metformin  Compliance with the medical regimen: Variable Hypoglycemia: None    Glucose monitoring:  done 0-1  times a day         Glucometer:  One Touch Ultra  Blood Glucose readings by patient  history  FASTING blood sugar range recently 107-139   Self-care: The diet that the patient has been following is: tries to limit carbs.      Typical meal intake: Breakfast is Egg sandwich Lunch is chicken, vegetables, fruit and dinner is starch, meat and fruit.  Snacks: Granola bar and fruit               Dietician visit, most recent: 2013                Weight history:209-229  Wt Readings from Last 3 Encounters:  08/05/19 211 lb 12.8 oz (96.1 kg)  05/19/19 197 lb (89.4 kg)  04/21/19 202 lb (91.6 kg)    Glycemic control:   Lab Results  Component Value Date   HGBA1C 6.4 (A) 08/05/2019   HGBA1C 7.5 (H) 12/05/2018   HGBA1C 7.8 (H) 09/13/2018   Lab Results  Component Value Date   MICROALBUR 0.8 10/26/2016   LDLCALC 126 (H) 04/16/2018   CREATININE 0.86 07/28/2019     Lab Results  Component Value Date   MICRALBCREAT <4 04/16/2018    Background history:   She is not clear when her diabetes for diagnosis but had a high A1c in 2010 She  was on various oral medications over the last few years including Januvia  Amaryl was probably started in 2015 in addition to metformin She thinks her control was good only in the first few years, A1c 2016 was 9.5 She was without medication for some time in December 2018 when A1c was 15+  A1c in December 2019 was 15.5 and usually over 10    Allergies as of 08/05/2019      Reactions   Codeine Other (See Comments)   INSOMNIA      Medication List       Accurate as of August 05, 2019  9:31 PM. If you have any questions, ask your nurse or doctor.        glimepiride 4 MG tablet Commonly known as: AMARYL Take 4 mg by mouth daily with breakfast.   Insulin Pen Needle 31G X 5 MM Misc Use once a day   Jardiance 25 MG Tabs tablet Generic drug: empagliflozin TAKE 1 TABLET BY MOUTH ONCE DAILY. DX:E11.65   metFORMIN 500 MG 24 hr tablet Commonly known as: GLUCOPHAGE-XR Take 500 mg by mouth daily with breakfast. Take 2 tablets by mouth  once daily at supper. What changed: Another medication with the same name was removed. Continue taking this medication, and follow the directions you see here. Changed by: Elayne Snare, MD   OneTouch Ultra test strip Generic drug: glucose blood TEST TWO TIMES A DAY   rosuvastatin 5 MG tablet Commonly known as: Crestor Take 1 tablet (5 mg total) by mouth daily. Started by: Elayne Snare, MD   Rybelsus 14 MG Tabs Generic drug: Semaglutide Take 14 mg by mouth daily.   Soliqua 100-33 UNT-MCG/ML Sopn Generic drug: Insulin Glargine-Lixisenatide INJECT 30 UNITS INTO THE SKIN DAILY.   tamoxifen 20 MG tablet Commonly known as: NOLVADEX Take 1 tablet (20 mg total) by mouth daily.   triamterene-hydrochlorothiazide 37.5-25 MG tablet Commonly known as: MAXZIDE-25 Take 1 tablet by mouth every morning. Reported on 09/03/2015   Vitamin D (Ergocalciferol) 1.25 MG (50000 UNIT) Caps capsule Commonly known as: DRISDOL TAKE 1 CAPSULE (50,000 UNITS TOTAL) BY MOUTH EVERY 7 (SEVEN) DAYS.       Allergies:  Allergies  Allergen Reactions  . Codeine Other (See Comments)    INSOMNIA    Past Medical History:  Diagnosis Date  . Breast mass, left    duct mass  . Depression   . Endometrial hyperplasia   . Fuchs' corneal dystrophy   . Gallbladder problem   . Heart murmur    states no known problems, has never seen a cardiologist  . Hypertension    states under control with med., has been on med. x 5 yr.  . Immature cataract   . Lower extremity edema   . 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 Nipple discharge 04/2014   left breast  . Non-insulin dependent type 2 diabetes mellitus [facility])     Past Surgical History:  Procedure Laterality Date  . BREAST BIOPSY Left 04/30/2014   Procedure: LEFT BREAST BIOPSY;  Surgeon: Jackolyn Confer, MD;  Location: [place] [facility];  Service: General;  Laterality: Left;  . BREAST DUCTAL SYSTEM EXCISION Left 04/30/2014   Procedure: LEFT NIPPLE DUCT  EXCISION;  Surgeon: Jackolyn Confer, MD;  Location: [place];  Service: General;  Laterality: Left;  . [place]  . CHOLECYSTECTOMY  1989  . ROBOTIC ASSISTED TOTAL HYSTERECTOMY WITH BILATERAL SALPINGO OOPHERECTOMY Bilateral 10/13/2014   Procedure: ROBOTIC ASSISTED TOTAL  HYSTERECTOMY WITH BILATERAL SALPINGO OOPHORECTOMY ;  Surgeon: Everitt Amber, MD;  Location: [facility];  Service: Gynecology;  Laterality: Bilateral;    Family History  Problem Relation Age of Onset  . Diabetes Mother   . Obesity Mother   . Heart disease Father   . Cancer Father        throat cancer   . Sudden death Father   . Alcoholism Father   . Diabetes Sister   . Diabetes Brother     Social History:  reports that she has never smoked. She has never used smokeless tobacco. She reports current alcohol use. She reports that she does not use drugs.   Review of Systems   Lipid history: LDL previously has been below 100  However she tends to have persistently high triglycerides Not on any treatment from PCP, previously had taken pravastatin which she stopped on her own Also not taking fenofibrate that was prescribed  Triglycerides are improved but still over 200 and LDL is now 121 by direct measurement      Lab Results  Component Value Date   CHOL 196 07/28/2019   CHOL 178 12/03/2018   CHOL 173 09/13/2018   Lab Results  Component Value Date   HDL 32.90 (L) 07/28/2019   HDL 29.70 (L) 12/03/2018   HDL 31.80 (L) 09/13/2018   Lab Results  Component Value Date   LDLCALC 126 (H) 04/16/2018   LDLCALC (H) 10/24/2008    157        Total Cholesterol/HDL:CHD Risk Coronary Heart Disease Risk Table                     Men   Women  1/2 Average Risk   3.4   3.3  Average Risk       5.0   4.4  2 X Average Risk   9.6   7.1  3 X Average Risk  23.4   11.0        Use the calculated Patient Ratio above and the CHD Risk Table to determine the patient's CHD Risk.        ATP III  CLASSIFICATION (LDL):  <100     mg/dL   Optimal  100-129  mg/dL   Near or Above                    Optimal  130-159  mg/dL   Borderline  160-189  mg/dL   High  >190     mg/dL   Very High   Lab Results  Component Value Date   TRIG 211.0 (H) 07/28/2019   TRIG 260.0 (H) 12/03/2018   TRIG 327.0 (H) 09/13/2018   Lab Results  Component Value Date   CHOLHDL 6 07/28/2019   CHOLHDL 6 12/03/2018   CHOLHDL 5 09/13/2018   Lab Results  Component Value Date   LDLDIRECT 121.0 07/28/2019   LDLDIRECT 110.0 12/03/2018   LDLDIRECT 91.0 09/13/2018    Hypertension: Currently treated with only Maxzide by her PCP Also on Jardiance  Not on ACE inhibitor/ARB Last microalbumin normal  LABS:  Office Visit on 08/05/2019  Component Date Value Ref Range Status  . Hemoglobin A1C 08/05/2019 6.4* 4.0 - 5.6 % Final  . POC Glucose 08/05/2019 129* 70 - 99 mg/dl Final    Physical Examination:  BP 124/82 (BP Location: Left Arm, Patient Position: Sitting, Cuff Size: Large)   Pulse 79   Ht 5\' 3"  (1.6 m)   Wt 211 lb  12.8 oz (96.1 kg)   SpO2 99%   BMI 37.52 kg/m          ASSESSMENT:  Diabetes type 2, with obesity  See history of present illness for detailed discussion of current diabetes management, blood sugar patterns and problems identified  Her A1c is 6.4  She is coming back after several months Currently on multidrug therapy with Soliqua, Rybelsus, Metformin and Jardiance along with Amaryl  Rybelsus was added by weight loss clinic and she appears to be now taking to GLP-1 drugs Recently has gained 14 pounds and compliance is poor as above Also monitoring is done only in the morning and not after meals  Although it would be advisable to use only Rybelsus and the basal insulin she refuses to consider insulin by itself Also now complaining of diarrhea from even 1000 mg of Metformin ER    LIPIDS: Has persistently high LDL and triglycerides even with improved blood sugar control  although diet has not been good recently   Hypertension, controlled only on triamterene HCTZ    PLAN:    She will need to stop Metformin to avoid side effects and not clear if she is benefiting from this  Also stop Amaryl while continuing [place] and Rybelsus  She needs to start checking blood sugars after meals and bring meter for download on each visit  Discussed blood sugar targets both fasting and after meals  Do not adjust Soliqua arbitrarily and take 30 units consistently unless fasting blood sugars out of range  More regular follow-up  She will start walking on her treadmill  Start ROSUVASTATIN 5 mg daily and discussed in detail how statins benefit cardiovascular risk as well as rosuvastatin been indicated for primary prevention and reduce plaque formation.  She can take it every other day in case she has any muscle aches initially  Recheck lipids on the next visit    Patient Instructions  Stop Metformin and Glimeperide  Check blood sugars on waking up 5 days a week  Also check blood sugars about 2 hours after meals and do this after different meals by rotation  Recommended blood sugar levels on waking up are 90-130 and about 2 hours after meal is 130-160  Please bring your blood sugar monitor to each visit, thank you  Stay on [place] daily  Walk daily     Elayne Snare 08/05/2019, 9:31 PM   Note: This office note was prepared with Dragon voice recognition system technology. Any transcriptional errors that result from this process are unintentional.

## 2019-09-16 ENCOUNTER — Other Ambulatory Visit: Payer: Self-pay

## 2019-09-16 ENCOUNTER — Ambulatory Visit: Payer: BC Managed Care – PPO | Admitting: Sports Medicine

## 2019-09-16 DIAGNOSIS — M7662 Achilles tendinitis, left leg: Secondary | ICD-10-CM

## 2019-09-16 DIAGNOSIS — M7661 Achilles tendinitis, right leg: Secondary | ICD-10-CM | POA: Insufficient documentation

## 2019-09-16 NOTE — Assessment & Plan Note (Signed)
Given patient had pain more at the body of the Achilles tendons bilaterally on palpation and from her history she most likely has Achilles tendinitis.  No evidence of Achilles tendon tear as it was visibly intact on ultrasound today.  She does have bilateral calcaneal spurs at the insertion of the Achilles tendon however she was only very slightly tender at this area so I do not think this is the cause of her pain. -Patient given 5/16 heel lifts to use in tennis shoes and instructed to wear at all times and avoid wearing no shoes, sandals, any flat shoes -Patient given eccentric exercises to perform at home for 6 weeks; follow-up with [place] at that time

## 2019-09-16 NOTE — Progress Notes (Signed)
SUBJECTIVE:   CHIEF COMPLAINT / HPI:   Bilateral Heel Pain [name] is a pleasant 64 year old female presenting today as a new patient to this practice for bilateral heel pain.  She states she has had this pain for "quite some time" and it is usually worse on the left than the right however she has it in both.  She states the pain comes and goes and she is not aware of anything that tends to cause these flareups.  She states the pain is quite painful and is nonradiating located in the back of her heel it is worse in the morning and wearing flat shoes or no shoes makes it worse.  She states that wearing heels makes it better and that ice and elevation have also helped some.  She has taken Tylenol and use Voltaren gel with minimal relief.  She states she was instructed by her primary care doctor to avoid taking oral NSAIDs due to other medications she is taking.  She had a recent flareup right before she went on vacation to [facility] and comes in today hoping to get a definitive diagnosis and know what her options for treatment are.  PERTINENT  PMH / PSH: T2DM, HX of endometrial cancer  OBJECTIVE:   BP (!) 144/77    Ht 5\' 3"  (1.6 m)    Wt (!) 211 lb (95.7 kg)    BMI 37.38 kg/m   MSK:  Ankle/Foot, Right: No visible erythema, swelling, ecchymosis, or bony deformity. Slightly notable pes planus/cavus deformity. Transverse arch grossly intact; No evidence of tibiotalar deviation; Range of motion is full in all directions. Strength is 5/5 in all directions. Tenderness at the midsubstance of the Achilles tendon; No peroneal tendon tenderness or subluxation; No tenderness on posterior aspects of lateral and medial malleolus; Stable lateral and medial ligaments; Unremarkable squeeze test; Talar dome nontender; Unremarkable calcaneal squeeze; No plantar calcaneal tenderness; No tenderness over the navicular prominence; No tenderness over cuboid; No pain at base of 5th MT; No tenderness at the  distal metatarsals; Able to walk 4 steps.  Special Tests:   - Thompson Squeeze test: NEG   - Anterior Drawer test: NEG Ankle/Foot, Left: No visible erythema, swelling, ecchymosis, or bony deformity. No notable pes planus/cavus deformity. Transverse arch grossly intact; No evidence of tibiotalar deviation; Range of motion is full in all directions. Strength is 5/5 in all directions. Tenderness at the midsubstance of the Achilles tendon; No peroneal tendon tenderness or subluxation; No tenderness on posterior aspects of lateral and medial malleolus; Stable lateral and medial ligaments; Unremarkable squeeze and kleiger tests; Talar dome nontender; Unremarkable calcaneal squeeze; No plantar calcaneal tenderness; No tenderness over the navicular prominence; No tenderness over cuboid; No pain at base of 5th MT; No tenderness at the distal metatarsals; Able to walk 4 steps.  Special Tests:   - Thompson Squeeze test: NEG   - Anterior Drawer test: NEG U/S Limited Foot and Ankle Ultrasound examination of the right achilles tendon shows calcification at the insertion of the tendon at the calcaneus.  She has slight thickening of the midportion of the tendon seen in longitudinal and transverse views but no hypoechoic changes and no signs of neovascularity indicating tearing. Ultrasound of the left Achilles tendon showed similar findings. These findings are consistent with mild chronic bilateral Achilles tendinopathy.  ASSESSMENT/PLAN:   Achilles tendonitis, bilateral Given patient had pain more at the body of the Achilles tendons bilaterally on palpation and from her history she  most likely has Achilles tendinitis.  No evidence of Achilles tendon tear as it was visibly intact on ultrasound today.  She does have bilateral calcaneal spurs at the insertion of the Achilles tendon however she was only very slightly tender at this area so I do not think this is the cause of her pain. -Patient given 5/16 heel lifts to  use in tennis shoes and instructed to wear at all times and avoid wearing no shoes, sandals, any flat shoes -Patient given eccentric exercises to perform at home for 6 weeks; follow-up with [place] at that time     Nuala Alpha, [place]   Patient seen and evaluated with the fellow.  I agree with the above plan of care.  Ultrasound evaluation today shows slight thickening of the Achilles tendon consistent with chronic intermittent mid substance Achilles tendinopathy.  We will treat with heel lifts and eccentric exercises.  Patient will follow up in 6 weeks for reevaluation.  If symptoms persist at that time, consider topical nitroglycerin and formal physical therapy.  Call with questions or concerns in the interim.

## 2019-10-01 ENCOUNTER — Other Ambulatory Visit (INDEPENDENT_AMBULATORY_CARE_PROVIDER_SITE_OTHER): Payer: Self-pay | Admitting: Family Medicine

## 2019-10-01 ENCOUNTER — Other Ambulatory Visit: Payer: Self-pay | Admitting: Endocrinology

## 2019-10-01 DIAGNOSIS — E1165 Type 2 diabetes mellitus with hyperglycemia: Secondary | ICD-10-CM

## 2019-10-01 DIAGNOSIS — E559 Vitamin D deficiency, unspecified: Secondary | ICD-10-CM

## 2019-10-14 ENCOUNTER — Encounter (INDEPENDENT_AMBULATORY_CARE_PROVIDER_SITE_OTHER): Payer: Self-pay | Admitting: Family Medicine

## 2019-10-14 ENCOUNTER — Ambulatory Visit (INDEPENDENT_AMBULATORY_CARE_PROVIDER_SITE_OTHER): Payer: BC Managed Care – PPO | Admitting: Family Medicine

## 2019-10-14 ENCOUNTER — Other Ambulatory Visit: Payer: Self-pay

## 2019-10-14 VITALS — BP 137/79 | HR 75 | Temp 98.2°F | Ht 63.0 in | Wt 218.0 lb

## 2019-10-14 DIAGNOSIS — Z6838 Body mass index (BMI) 38.0-38.9, adult: Secondary | ICD-10-CM

## 2019-10-14 DIAGNOSIS — E119 Type 2 diabetes mellitus without complications: Secondary | ICD-10-CM | POA: Diagnosis not present

## 2019-10-14 DIAGNOSIS — Z9189 Other specified personal risk factors, not elsewhere classified: Secondary | ICD-10-CM | POA: Diagnosis not present

## 2019-10-14 DIAGNOSIS — Z794 Long term (current) use of insulin: Secondary | ICD-10-CM

## 2019-10-14 DIAGNOSIS — E559 Vitamin D deficiency, unspecified: Secondary | ICD-10-CM

## 2019-10-14 NOTE — Progress Notes (Signed)
Chief Complaint:   OBESITY Monica Clark is here to discuss her progress with her obesity treatment plan along with follow-up of her obesity related diagnoses. Monica Clark is on keeping a food journal and adhering to recommended goals of 1200-1500 calories and 80+ grams of protein daily and states she is following her eating plan approximately 0% of the time. [name] states she is doing 0 minutes 0 times per week.  Today's visit was #: 26 Starting weight: 216 lbs Starting date: 04/16/2018 Today's weight: 218 lbs Today's date: 10/14/2019 Total lbs lost to date: 0 Total lbs lost since last in-office visit: 0  Interim History: Monica Clark's last visit in the clinic was approximately 5 months ago, and she has fallen off track and regained her weight. She is ready to start back again and she feels she would do the best on the Category 2 plan.  Subjective:   1. Type 2 diabetes mellitus without complication, with long-term current use of insulin (HCC) Monica Clark has been out of Rybelsus and her BGs have gotten out of control. Her BGs are now ranging between 120 and 300.  2. Vitamin D deficiency Monica Clark has been out of Vit D for months. She notes her fatigue has increased.  3. At risk for nausea Monica Clark is at risk for nausea due to restarting Rybelsus.  Assessment/Plan:   1. Type 2 diabetes mellitus without complication, with long-term current use of insulin (HCC) Good blood sugar control is important to decrease the likelihood of diabetic complications such as nephropathy, neuropathy, limb loss, blindness, coronary artery disease, and death. Intensive lifestyle modification including diet, exercise and weight loss are the first line of treatment for diabetes. Babs agreed to restart Rybelsus 7 mg q AM with no refills, and she will start her Category 2 plan. Will continue to follow closely, and we will recheck labs in 1 month.  - Semaglutide (RYBELSUS) 7 MG TABS; Take 7 mg by mouth in the morning.  Dispense: 30  tablet; Refill: 0  2. Vitamin D deficiency Low Vitamin D level contributes to fatigue and are associated with obesity, breast, and colon cancer. We will refill prescription Vitamin D for 1 month, and we will recheck labs in 1 month. [name] will follow-up for routine testing of Vitamin D, at least 2-3 times per year to avoid over-replacement.  - Vitamin D, Ergocalciferol, (DRISDOL) 1.25 MG (50000 UNIT) CAPS capsule; Take 1 capsule (50,000 Units total) by mouth every 7 (seven) days.  Dispense: 4 capsule; Refill: 0  3. At risk for nausea Monica Clark was given approximately 15 minutes of nausea prevention counseling today. Monica Clark is at risk for nausea due to her new or current medication. She was encouraged to titrate her medication slowly, make sure to stay hydrated, eat smaller portions throughout the day, and avoid high fat meals.   4. Class 2 severe obesity with serious comorbidity and body mass index (BMI) of 38.0 to 38.9 in adult, unspecified obesity type [facility]) Monica Clark is currently in the action stage of change. As such, her goal is to continue with weight loss efforts. She has agreed to the Category 2 Plan.   Behavioral modification strategies: increasing lean protein intake and increasing water intake.  Monica Clark has agreed to follow-up with our clinic in 2 weeks. She was informed of the importance of frequent follow-up visits to maximize her success with intensive lifestyle modifications for her multiple health conditions.   Objective:   Blood pressure 137/79, pulse 75, temperature 98.2 F (36.8  C), temperature source Oral, height 5\' 3"  (1.6 m), weight 218 lb (98.9 kg), SpO2 98 %. Body mass index is 38.62 kg/m.  General: Cooperative, alert, well developed, in no acute distress. HEENT: Conjunctivae and lids unremarkable. Cardiovascular: Regular rhythm.  Lungs: Normal work of breathing. Neurologic: No focal deficits.   Lab Results  Component Value Date   CREATININE 0.86 07/28/2019    BUN 13 07/28/2019   NA 139 07/28/2019   K 3.6 07/28/2019   CL 107 07/28/2019   CO2 25 07/28/2019   Lab Results  Component Value Date   ALT 13 12/03/2018   AST 22 12/03/2018   ALKPHOS 66 12/03/2018   BILITOT 0.4 12/03/2018   Lab Results  Component Value Date   HGBA1C 6.4 (A) 08/05/2019   HGBA1C 7.5 (H) 12/05/2018   HGBA1C 7.8 (H) 09/13/2018   HGBA1C 9.0 (H) 04/16/2018   HGBA1C 8.2 06/20/2017   Lab Results  Component Value Date   INSULIN 8.7 04/16/2018   Lab Results  Component Value Date   TSH 2.220 04/16/2018   Lab Results  Component Value Date   CHOL 196 07/28/2019   HDL 32.90 (L) 07/28/2019   LDLCALC 126 (H) 04/16/2018   LDLDIRECT 121.0 07/28/2019   TRIG 211.0 (H) 07/28/2019   CHOLHDL 6 07/28/2019   Lab Results  Component Value Date   WBC 12.6 (H) 10/21/2018   HGB 13.4 10/21/2018   HCT 41.7 10/21/2018   MCV 93.5 10/21/2018   PLT 346 10/21/2018   No results found for: IRON, TIBC, FERRITIN  Attestation Statements:   Reviewed by clinician on day of visit: allergies, medications, problem list, medical history, surgical history, family history, social history, and previous encounter notes.   I, Trixie Dredge, am acting as transcriptionist for Dennard Nip, MD.  I have reviewed the above documentation for accuracy and completeness, and I agree with the above. -  Dennard Nip, MD

## 2019-10-15 ENCOUNTER — Other Ambulatory Visit (INDEPENDENT_AMBULATORY_CARE_PROVIDER_SITE_OTHER): Payer: Self-pay | Admitting: Family Medicine

## 2019-10-15 DIAGNOSIS — E559 Vitamin D deficiency, unspecified: Secondary | ICD-10-CM

## 2019-10-15 DIAGNOSIS — Z794 Long term (current) use of insulin: Secondary | ICD-10-CM

## 2019-10-16 MED ORDER — VITAMIN D (ERGOCALCIFEROL) 1.25 MG (50000 UNIT) PO CAPS: 50000.0000 [IU] | ORAL_CAPSULE | ORAL | 0 refills | Status: DC

## 2019-10-16 MED ORDER — RYBELSUS 7 MG PO TABS: 7.0000 mg | ORAL_TABLET | Freq: Every morning | ORAL | 0 refills | Status: DC

## 2019-10-24 ENCOUNTER — Telehealth: Payer: Self-pay | Admitting: Nurse Practitioner

## 2019-10-24 NOTE — Telephone Encounter (Signed)
R/s 9/7 appt per 9/3 sch msg. Called and left msg with new appt and left a call back number.

## 2019-10-28 ENCOUNTER — Inpatient Hospital Stay: Payer: BC Managed Care – PPO

## 2019-10-28 ENCOUNTER — Ambulatory Visit: Payer: BC Managed Care – PPO | Admitting: Sports Medicine

## 2019-10-28 ENCOUNTER — Inpatient Hospital Stay: Payer: BC Managed Care – PPO | Admitting: Nurse Practitioner

## 2019-10-29 ENCOUNTER — Other Ambulatory Visit: Payer: Self-pay | Admitting: Endocrinology

## 2019-10-29 ENCOUNTER — Encounter (INDEPENDENT_AMBULATORY_CARE_PROVIDER_SITE_OTHER): Payer: Self-pay | Admitting: Family Medicine

## 2019-10-29 ENCOUNTER — Ambulatory Visit (INDEPENDENT_AMBULATORY_CARE_PROVIDER_SITE_OTHER): Payer: BC Managed Care – PPO | Admitting: Family Medicine

## 2019-10-29 ENCOUNTER — Other Ambulatory Visit: Payer: Self-pay

## 2019-10-29 VITALS — BP 156/85 | HR 77 | Temp 98.1°F | Ht 63.0 in | Wt 219.0 lb

## 2019-10-29 DIAGNOSIS — Z6838 Body mass index (BMI) 38.0-38.9, adult: Secondary | ICD-10-CM

## 2019-10-29 DIAGNOSIS — G4709 Other insomnia: Secondary | ICD-10-CM

## 2019-10-29 DIAGNOSIS — E1169 Type 2 diabetes mellitus with other specified complication: Secondary | ICD-10-CM | POA: Diagnosis not present

## 2019-10-29 DIAGNOSIS — Z794 Long term (current) use of insulin: Secondary | ICD-10-CM | POA: Diagnosis not present

## 2019-10-29 DIAGNOSIS — Z9189 Other specified personal risk factors, not elsewhere classified: Secondary | ICD-10-CM | POA: Diagnosis not present

## 2019-10-29 DIAGNOSIS — E1165 Type 2 diabetes mellitus with hyperglycemia: Secondary | ICD-10-CM

## 2019-10-30 ENCOUNTER — Telehealth (INDEPENDENT_AMBULATORY_CARE_PROVIDER_SITE_OTHER): Payer: Self-pay

## 2019-10-30 NOTE — Telephone Encounter (Signed)
Per Dr Leafy Ro, she changed her mind on the increased Rybelsus dose. Explained to patient that Dr Leafy Ro said that they will discuss other options at the next visit and to keep everything the same. Call to patient to make her aware of this plan. Pt agreed and verbalized understanding.  Call ended.

## 2019-11-03 NOTE — Progress Notes (Deleted)
[name]   Telephone:([phone]) [phone] [phone]   Clinic Follow up Note   Patient Care Team: [name], MD as PCP - General (Family Medicine) Everitt Amber, MD as Consulting Physician (Obstetrics and Gynecology) 11/03/2019  CHIEF COMPLAINT: F/u left breast ADH  CURRENT THERAPY:  Tamoxifen 20 mg daily, started 11/2014 plan to complete 11/2019  INTERVAL HISTORY: Monica Clark returns for f/u as scheduled. She was last seen 09/2018. She has not had a mammogram since 04/2018.    REVIEW OF SYSTEMS:   Constitutional: Denies fevers, chills or abnormal weight loss Eyes: Denies blurriness of vision Ears, nose, mouth, throat, and face: Denies mucositis or sore throat Respiratory: Denies cough, dyspnea or wheezes Cardiovascular: Denies palpitation, chest discomfort or lower extremity swelling Gastrointestinal:  Denies nausea, heartburn or change in bowel habits Skin: Denies abnormal skin rashes Lymphatics: Denies new lymphadenopathy or easy bruising Neurological:Denies numbness, tingling or new weaknesses Behavioral/Psych: Mood is stable, no new changes  All other systems were reviewed with the patient and are negative.  MEDICAL HISTORY:  Past Medical History:  Diagnosis Date  . Breast mass, left    duct mass  . Depression   . Endometrial hyperplasia   . Fuchs' corneal dystrophy   . Gallbladder problem   . Heart murmur    states no known problems, has never seen a cardiologist  . Hypertension    states under control with med., has been on med. x 5 yr.  . Immature cataract   . Lower extremity edema   . 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 Nipple discharge 04/2014   left breast  . Non-insulin dependent type 2 diabetes mellitus ([place])     SURGICAL HISTORY: Past Surgical History:  Procedure Laterality Date  . BREAST BIOPSY Left 04/30/2014   Procedure: LEFT BREAST BIOPSY;  Surgeon: Jackolyn Confer, MD;  Location: [facility];  Service:  General;  Laterality: Left;  . BREAST DUCTAL SYSTEM EXCISION Left 04/30/2014   Procedure: LEFT NIPPLE DUCT EXCISION;  Surgeon: Jackolyn Confer, MD;  Location: [facility];  Service: General;  Laterality: Left;  . [place]  . CHOLECYSTECTOMY  1989  . ROBOTIC ASSISTED TOTAL HYSTERECTOMY WITH BILATERAL SALPINGO OOPHERECTOMY Bilateral 10/13/2014   Procedure: ROBOTIC ASSISTED TOTAL HYSTERECTOMY WITH BILATERAL SALPINGO OOPHORECTOMY ;  Surgeon: Everitt Amber, MD;  Location: [facility];  Service: Gynecology;  Laterality: Bilateral;    I have reviewed the social history and family history with the patient and they are unchanged from previous note.  ALLERGIES:  is allergic to codeine.  MEDICATIONS:  Current Outpatient Medications  Medication Sig Dispense Refill  . glimepiride (AMARYL) 4 MG tablet Take 4 mg by mouth daily with breakfast.    . Insulin Pen Needle 31G X 5 MM MISC Use once a day 100 each 1  . JARDIANCE 25 MG TABS tablet TAKE 1 TABLET BY MOUTH ONCE DAILY. DX:E11.65 30 tablet 1  . metFORMIN (GLUCOPHAGE-XR) 500 MG 24 hr tablet Take 500 mg by mouth daily with breakfast. Take 2 tablets by mouth once daily at supper.    Glory Rosebush ULTRA test strip TEST TWO TIMES A DAY 100 strip 0  . rosuvastatin (CRESTOR) 5 MG tablet TAKE 1 TABLET BY MOUTH EVERY DAY 90 tablet 1  . SOLIQUA 100-33 UNT-MCG/ML SOPN INJECT 30 UNITS INTO THE SKIN DAILY. 15 mL 1  . tamoxifen (NOLVADEX) 20 MG tablet Take 1 tablet (20 mg total) by mouth daily. 90 tablet 3  .  triamterene-hydrochlorothiazide (MAXZIDE-25) 37.5-25 MG per tablet Take 1 tablet by mouth every morning. Reported on 09/03/2015    . Vitamin D, Ergocalciferol, (DRISDOL) 1.25 MG (50000 UNIT) CAPS capsule Take 1 capsule (50,000 Units total) by mouth every 7 (seven) days. 4 capsule 0   No current facility-administered medications for this visit.    PHYSICAL EXAMINATION: ECOG PERFORMANCE STATUS: {CHL ONC ECOG [phone]}  There were no  vitals filed for this visit. There were no vitals filed for this visit.  GENERAL:alert, no distress and comfortable SKIN: skin color, texture, turgor are normal, no rashes or significant lesions EYES: normal, Conjunctiva are pink and non-injected, sclera clear OROPHARYNX:no exudate, no erythema and lips, buccal mucosa, and tongue normal  NECK: supple, thyroid normal size, non-tender, without nodularity LYMPH:  no palpable lymphadenopathy in the cervical, axillary or inguinal LUNGS: clear to auscultation and percussion with normal breathing effort HEART: regular rate & rhythm and no murmurs and no lower extremity edema ABDOMEN:abdomen soft, non-tender and normal bowel sounds Musculoskeletal:no cyanosis of digits and no clubbing  NEURO: alert & oriented x 3 with fluent speech, no focal motor/sensory deficits  LABORATORY DATA:  I have reviewed the data as listed CBC Latest Ref Rng & Units 10/21/2018 04/16/2018 03/22/2017  WBC 4.0 - 10.5 K/uL 12.6(H) 12.1(H) 11.2(H)  Hemoglobin 12.0 - 15.0 g/dL 13.4 15.0 14.5  Hematocrit 36 - 46 % 41.7 45.0 43.5  Platelets 150 - 400 K/uL 346 - 420(H)     CMP Latest Ref Rng & Units 07/28/2019 12/03/2018 10/21/2018  Glucose 70 - 99 mg/dL 150(H) 130(H) 80  BUN 6 - 23 mg/dL 13 10 12   Creatinine 0.40 - 1.20 mg/dL 0.86 0.85 0.85  Sodium 135 - 145 mEq/L 139 142 138  Potassium 3.5 - 5.1 mEq/L 3.6 3.8 3.6  Chloride 96 - 112 mEq/L 107 109 105  CO2 19 - 32 mEq/L 25 25 24   Calcium 8.4 - 10.5 mg/dL 8.9 9.0 9.0  Total Protein 6.0 - 8.3 g/dL - 7.1 7.6  Total Bilirubin 0.2 - 1.2 mg/dL - 0.4 0.2(L)  Alkaline Phos 39 - 117 U/L - 66 75  AST 0 - 37 U/L - 22 21  ALT 0 - 35 U/L - 13 15      RADIOGRAPHIC STUDIES: I have personally reviewed the radiological images as listed and agreed with the findings in the report. No results found.   ASSESSMENT & PLAN:  No problem-specific Assessment & Plan notes found for this encounter.   No orders of the defined types were  placed in this encounter.  All questions were answered. The patient knows to call the clinic with any problems, questions or concerns. No barriers to learning was detected. I spent {CHL ONC TIME VISIT - HWEXH:[id number]} counseling the patient face to face. The total time spent in the appointment was {CHL ONC TIME VISIT - YBOFB:[id number]} and more than 50% was on counseling and review of test results     Monica Feeling, NP 11/03/19

## 2019-11-04 ENCOUNTER — Telehealth: Payer: Self-pay | Admitting: Hematology

## 2019-11-04 ENCOUNTER — Encounter: Payer: Self-pay | Admitting: Nurse Practitioner

## 2019-11-04 ENCOUNTER — Inpatient Hospital Stay: Payer: BC Managed Care – PPO | Admitting: Nurse Practitioner

## 2019-11-04 ENCOUNTER — Telehealth: Payer: Self-pay

## 2019-11-04 ENCOUNTER — Inpatient Hospital Stay: Payer: BC Managed Care – PPO | Attending: Adult Health

## 2019-11-04 NOTE — Telephone Encounter (Signed)
Called pt per 9/14 sch msg - left message for patient to call back to reschedule missed appt.

## 2019-11-04 NOTE — Telephone Encounter (Signed)
Pt was a no show this morning called x2 left a message and will send a scheduling message for reschedule provider/all aware

## 2019-11-07 ENCOUNTER — Other Ambulatory Visit (INDEPENDENT_AMBULATORY_CARE_PROVIDER_SITE_OTHER): Payer: Self-pay | Admitting: Family Medicine

## 2019-11-07 DIAGNOSIS — E559 Vitamin D deficiency, unspecified: Secondary | ICD-10-CM

## 2019-11-07 DIAGNOSIS — Z794 Long term (current) use of insulin: Secondary | ICD-10-CM

## 2019-11-10 NOTE — Progress Notes (Signed)
Chief Complaint:   OBESITY Monica Clark is here to discuss her progress with her obesity treatment plan along with follow-up of her obesity related diagnoses. Monica Clark is on the Category 2 Plan and states she is following her eating plan approximately 70% of the time. Monica Clark states she is doing 0 minutes 0 times per week.  Today's visit was #: 18 Starting weight: 216 lbs Starting date: 04/16/2018 Today's weight: 219 lbs Today's date: 10/29/2019 Total lbs lost to date: 0 Total lbs lost since last in-office visit: 0  Interim History: Monica Clark is struggling with following her plan. She notes increased cravings and is doing more comfort eating. She states she is ready to get back on track.  Subjective:   1. Type 2 diabetes mellitus with other specified complication, with long-term current use of insulin (HCC) Monica Clark is on Rybelsus, not noticing much hunger control. She denies nausea, vomiting, or hypoglycemia.   2. Other insomnia Monica Clark is not sleeping well, and she is only sleeping approximately 5 hours night. If she tries to go to bed earlier she wakes up earlier and cannot fall back to sleep.  3. At risk for impaired metabolic function Monica Clark is at increased risk for impaired metabolic function due to poor sleep quality.  Assessment/Plan:   1. Type 2 diabetes mellitus with other specified complication, with long-term current use of insulin (HCC) Good blood sugar control is important to decrease the likelihood of diabetic complications such as nephropathy, neuropathy, limb loss, blindness, coronary artery disease, and death. Intensive lifestyle modification including diet, exercise and weight loss are the first line of treatment for diabetes. Monica Clark agreed to increase Rybelsus to 14 mg q AM, and we will refill for 1 month, and will see if her insurance will cover it.  2. Other insomnia The problem of recurrent insomnia was discussed. Orders and follow up as documented in patient record. Counseling:  Intensive lifestyle modifications are the first line treatment for this issue. We discussed several lifestyle modifications today. We will refer to Dr. Brett Fairy for a consultation. Monica Clark will continue to work on diet, exercise and weight loss efforts.   3. At risk for impaired metabolic function Monica Clark was given approximately 15 minutes of impaired  metabolic function prevention counseling today. We discussed intensive lifestyle modifications today with an emphasis on specific nutrition and exercise instructions and strategies.   Repetitive spaced learning was employed today to elicit superior memory formation and behavioral change.  4. Class 2 severe obesity with serious comorbidity and body mass index (BMI) of 38.0 to 38.9 in adult, unspecified obesity type [facility]) Monica Clark is currently in the action stage of change. As such, her goal is to continue with weight loss efforts. She has agreed to the Category 2 Plan or keeping a food journal and adhering to recommended goals of 1200-1500 calories and 80 grams of protein daily.   Behavioral modification strategies: increasing lean protein intake, meal planning and cooking strategies and emotional eating strategies.  Monica Clark has agreed to follow-up with our clinic in 2 to 3 weeks. She was informed of the importance of frequent follow-up visits to maximize her success with intensive lifestyle modifications for her multiple health conditions.   Objective:   Blood pressure (!) 156/85, pulse 77, temperature 98.1 F (36.7 C), height 5\' 3"  (1.6 m), weight 219 lb (99.3 kg), SpO2 100 %. Body mass index is 38.79 kg/m.  General: Cooperative, alert, well developed, in no acute distress. HEENT: Conjunctivae and lids unremarkable. Cardiovascular: Regular rhythm.  Lungs: Normal work of breathing. Neurologic: No focal deficits.   Lab Results  Component Value Date   CREATININE 0.86 07/28/2019   BUN 13 07/28/2019   NA 139 07/28/2019   K 3.6 07/28/2019   CL 107  07/28/2019   CO2 25 07/28/2019   Lab Results  Component Value Date   ALT 13 12/03/2018   AST 22 12/03/2018   ALKPHOS 66 12/03/2018   BILITOT 0.4 12/03/2018   Lab Results  Component Value Date   HGBA1C 6.4 (A) 08/05/2019   HGBA1C 7.5 (H) 12/05/2018   HGBA1C 7.8 (H) 09/13/2018   HGBA1C 9.0 (H) 04/16/2018   HGBA1C 8.2 06/20/2017   Lab Results  Component Value Date   INSULIN 8.7 04/16/2018   Lab Results  Component Value Date   TSH 2.220 04/16/2018   Lab Results  Component Value Date   CHOL 196 07/28/2019   HDL 32.90 (L) 07/28/2019   LDLCALC 126 (H) 04/16/2018   LDLDIRECT 121.0 07/28/2019   TRIG 211.0 (H) 07/28/2019   CHOLHDL 6 07/28/2019   Lab Results  Component Value Date   WBC 12.6 (H) 10/21/2018   HGB 13.4 10/21/2018   HCT 41.7 10/21/2018   MCV 93.5 10/21/2018   PLT 346 10/21/2018   No results found for: IRON, TIBC, FERRITIN  Attestation Statements:   Reviewed by clinician on day of visit: allergies, medications, problem list, medical history, surgical history, family history, social history, and previous encounter notes.   I, Trixie Dredge, am acting as transcriptionist for Dennard Nip, MD.  I have reviewed the above documentation for accuracy and completeness, and I agree with the above. -  Dennard Nip, MD

## 2019-11-18 ENCOUNTER — Other Ambulatory Visit: Payer: BC Managed Care – PPO

## 2019-11-19 ENCOUNTER — Ambulatory Visit (INDEPENDENT_AMBULATORY_CARE_PROVIDER_SITE_OTHER): Payer: BC Managed Care – PPO | Admitting: Family Medicine

## 2019-11-25 ENCOUNTER — Ambulatory Visit: Payer: BC Managed Care – PPO | Admitting: Endocrinology

## 2019-12-08 ENCOUNTER — Encounter (INDEPENDENT_AMBULATORY_CARE_PROVIDER_SITE_OTHER): Payer: Self-pay | Admitting: Family Medicine

## 2019-12-08 ENCOUNTER — Other Ambulatory Visit: Payer: Self-pay

## 2019-12-08 ENCOUNTER — Ambulatory Visit (INDEPENDENT_AMBULATORY_CARE_PROVIDER_SITE_OTHER): Payer: BC Managed Care – PPO | Admitting: Family Medicine

## 2019-12-08 VITALS — BP 132/77 | HR 82 | Temp 98.6°F | Ht 60.0 in | Wt 217.0 lb

## 2019-12-08 DIAGNOSIS — E1169 Type 2 diabetes mellitus with other specified complication: Secondary | ICD-10-CM | POA: Diagnosis not present

## 2019-12-08 DIAGNOSIS — Z6838 Body mass index (BMI) 38.0-38.9, adult: Secondary | ICD-10-CM

## 2019-12-08 DIAGNOSIS — Z794 Long term (current) use of insulin: Secondary | ICD-10-CM | POA: Diagnosis not present

## 2019-12-08 DIAGNOSIS — Z9189 Other specified personal risk factors, not elsewhere classified: Secondary | ICD-10-CM

## 2019-12-08 DIAGNOSIS — E7849 Other hyperlipidemia: Secondary | ICD-10-CM

## 2019-12-11 MED ORDER — RYBELSUS 7 MG PO TABS: 7.0000 mg | ORAL_TABLET | Freq: Every day | ORAL | 0 refills | Status: DC

## 2019-12-16 ENCOUNTER — Telehealth: Payer: Self-pay | Admitting: Hematology

## 2019-12-16 NOTE — Telephone Encounter (Signed)
Scheduled per 10/25 sch message spoke with patient she is aware

## 2019-12-16 NOTE — Progress Notes (Signed)
Chief Complaint:   OBESITY Monica Clark is here to discuss her progress with her obesity treatment plan along with follow-up of her obesity related diagnoses. Monica Clark is on the Category 2 Plan or keeping a food journal and adhering to recommended goals of 1200-1500 calories and 80 grams of protein daily and states she is following her eating plan approximately 70% of the time. Monica Clark states she is doing 0 minutes 0 times per week.  Today's visit was #: 51 Starting weight: 216 lbs Starting date: 04/16/2018 Today's weight: 217 lbs Today's date: 12/08/2019 Total lbs lost to date: 0 Total lbs lost since last in-office visit: 2  Interim History: Monica Clark continues to do well with weight loss but she continues to deal with multiple stressors, which has made meal planning difficult. She is frustrated that her weight loss isn't faster.  Subjective:   1. Other hyperlipidemia Monica Clark is stable on Crestor, and she denies chest pain or myalgias. She is working diet and weight loss.  2. Type 2 diabetes mellitus with other specified complication, with long-term current use of insulin (HCC) Monica Clark states her fasting BGs range between 130 and 160. She is on Soliqua, Amaryl, metformin, Jardiance, and Rybelsus. We had considered increasing Rybelsus but with her already being on a DPP-4, she hasn't been able to see her Endocrinologist to see if they are ok with this combination.  3. At risk for hypoglycemia Monica Clark is at increased risk for hypoglycemia due to medication combination and lower carbohydrate diet.  Assessment/Plan:   1. Other hyperlipidemia Cardiovascular risk and specific lipid/LDL goals reviewed. We discussed several lifestyle modifications today. Monica Clark will continue to work on diet, exercise and weight loss efforts. We will plan to recheck labs in 1 month. Orders and follow up as documented in patient record.   2. Type 2 diabetes mellitus with other specified complication, with long-term current use  of insulin (HCC) Good blood sugar control is important to decrease the likelihood of diabetic complications such as nephropathy, neuropathy, limb loss, blindness, coronary artery disease, and death. Intensive lifestyle modification including diet, exercise and weight loss are the first line of treatment for diabetes. We will refill refill Rybelsus at 7 mg for 1 month, and will change her eating plan to the lower carbohydrate. We will follow closely, and will plan to recheck labs in 1 month.  - Semaglutide (RYBELSUS) 7 MG TABS; Take 7 mg by mouth daily.  Dispense: 30 tablet; Refill: 0  3. At risk for hypoglycemia Monica Clark was given approximately 15 minutes of counseling today regarding prevention of hypoglycemia. We will continue to monitor carefully. She was advised of symptoms of hypoglycemia. Monica Clark was instructed to avoid skipping meals, eat regular protein rich meals and schedule low calorie snacks as needed.   Repetitive spaced learning was employed today to elicit superior memory formation and behavioral change  4. Class 2 severe obesity with serious comorbidity and body mass index (BMI) of 38.0 to 38.9 in adult, unspecified obesity type [facility]) Monica Clark is currently in the action stage of change. As such, her goal is to continue with weight loss efforts. She has agreed to change to following a lower carbohydrate, vegetable and lean protein rich diet plan.   Behavioral modification strategies: increasing lean protein intake.  Monica Clark has agreed to follow-up with our clinic in 2 to 3 weeks. She was informed of the importance of frequent follow-up visits to maximize her success with intensive lifestyle modifications for her multiple health conditions.   Objective:  Blood pressure 132/77, pulse 82, temperature 98.6 F (37 C), height 5' (1.524 m), weight 217 lb (98.4 kg), SpO2 99 %. Body mass index is 42.38 kg/m.  General: Cooperative, alert, well developed, in no acute distress. HEENT: Conjunctivae  and lids unremarkable. Cardiovascular: Regular rhythm.  Lungs: Normal work of breathing. Neurologic: No focal deficits.   Lab Results  Component Value Date   CREATININE 0.86 07/28/2019   BUN 13 07/28/2019   NA 139 07/28/2019   K 3.6 07/28/2019   CL 107 07/28/2019   CO2 25 07/28/2019   Lab Results  Component Value Date   ALT 13 12/03/2018   AST 22 12/03/2018   ALKPHOS 66 12/03/2018   BILITOT 0.4 12/03/2018   Lab Results  Component Value Date   HGBA1C 6.4 (A) 08/05/2019   HGBA1C 7.5 (H) 12/05/2018   HGBA1C 7.8 (H) 09/13/2018   HGBA1C 9.0 (H) 04/16/2018   HGBA1C 8.2 06/20/2017   Lab Results  Component Value Date   INSULIN 8.7 04/16/2018   Lab Results  Component Value Date   TSH 2.220 04/16/2018   Lab Results  Component Value Date   CHOL 196 07/28/2019   HDL 32.90 (L) 07/28/2019   LDLCALC 126 (H) 04/16/2018   LDLDIRECT 121.0 07/28/2019   TRIG 211.0 (H) 07/28/2019   CHOLHDL 6 07/28/2019   Lab Results  Component Value Date   WBC 12.6 (H) 10/21/2018   HGB 13.4 10/21/2018   HCT 41.7 10/21/2018   MCV 93.5 10/21/2018   PLT 346 10/21/2018   No results found for: IRON, TIBC, FERRITIN  Attestation Statements:   Reviewed by clinician on day of visit: allergies, medications, problem list, medical history, surgical history, family history, social history, and previous encounter notes.   I, Trixie Dredge, am acting as transcriptionist for Dennard Nip, MD.  I have reviewed the above documentation for accuracy and completeness, and I agree with the above. -  Dennard Nip, MD

## 2020-01-01 ENCOUNTER — Other Ambulatory Visit: Payer: Self-pay

## 2020-01-01 ENCOUNTER — Encounter (INDEPENDENT_AMBULATORY_CARE_PROVIDER_SITE_OTHER): Payer: Self-pay | Admitting: Family Medicine

## 2020-01-01 ENCOUNTER — Ambulatory Visit (INDEPENDENT_AMBULATORY_CARE_PROVIDER_SITE_OTHER): Payer: BC Managed Care – PPO | Admitting: Family Medicine

## 2020-01-01 VITALS — BP 142/73 | HR 72 | Temp 98.2°F | Ht 60.0 in | Wt 221.0 lb

## 2020-01-01 DIAGNOSIS — E1169 Type 2 diabetes mellitus with other specified complication: Secondary | ICD-10-CM

## 2020-01-01 DIAGNOSIS — Z6841 Body Mass Index (BMI) 40.0 and over, adult: Secondary | ICD-10-CM

## 2020-01-01 MED ORDER — OZEMPIC (0.25 OR 0.5 MG/DOSE) 2 MG/1.5ML ~~LOC~~ SOPN: 0.5000 mg | PEN_INJECTOR | SUBCUTANEOUS | 0 refills | Status: DC

## 2020-01-05 NOTE — Progress Notes (Signed)
[place]   Telephone:([phone] [phone]   Clinic Follow up Note   Patient Care Team: [name], MD as PCP - General (Family Medicine) Everitt Amber, MD as Consulting Physician (Obstetrics and Gynecology)  Date of Service:  01/08/2020  CHIEF COMPLAINT: Follow up left breast atypical ductal hyperplasia   CURRENT THERAPY:  Tamoxifen 20 mg daily, started on 11/30/2014, completed on 12/21/2019   INTERVAL HISTORY:  Monica Clark is here for a follow up of left ADH. She was last seen by me in 09/2018. She presents to the clinic alone.  She completed 5 years of tamoxifen and stopped the end of October 2021.  She started worse in the lower abdomen, it is persistent, moderate, worse with movement.  Any nausea vomiting, or change of bowel habit.  No hematochezia or melena, no vaginal discharge.  No change of her appetite and energy level.  Review of system otherwise negative.   MEDICAL HISTORY:  Past Medical History:  Diagnosis Date  . Breast mass, left    duct mass  . Depression   . Endometrial hyperplasia   . Fuchs' corneal dystrophy   . Gallbladder problem   . Heart murmur    states no known problems, has never seen a cardiologist  . Hypertension    states under control with med., has been on med. x 5 yr.  . Immature cataract   . Lower extremity edema   . 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 Nipple discharge 04/2014   left breast  . Non-insulin dependent type 2 diabetes mellitus ([place])     SURGICAL HISTORY: Past Surgical History:  Procedure Laterality Date  . BREAST BIOPSY Left 04/30/2014   Procedure: LEFT BREAST BIOPSY;  Surgeon: Jackolyn Confer, MD;  Location: [place];  Service: General;  Laterality: Left;  . BREAST DUCTAL SYSTEM EXCISION Left 04/30/2014   Procedure: LEFT NIPPLE DUCT EXCISION;  Surgeon: Jackolyn Confer, MD;  Location: [facility];  Service: General;  Laterality: Left;  . [place]  . CHOLECYSTECTOMY  1989  . ROBOTIC ASSISTED TOTAL HYSTERECTOMY WITH BILATERAL SALPINGO OOPHERECTOMY Bilateral 10/13/2014   Procedure: ROBOTIC ASSISTED TOTAL HYSTERECTOMY WITH BILATERAL SALPINGO OOPHORECTOMY ;  Surgeon: Everitt Amber, MD;  Location: [facility];  Service: Gynecology;  Laterality: Bilateral;    I have reviewed the social history and family history with the patient and they are unchanged from previous note.  ALLERGIES:  is allergic to codeine.  MEDICATIONS:  Current Outpatient Medications  Medication Sig Dispense Refill  . glimepiride (AMARYL) 4 MG tablet Take 4 mg by mouth daily with breakfast.    . Insulin Pen Needle 31G X 5 MM MISC Use once a day 100 each 1  . JARDIANCE 25 MG TABS tablet TAKE 1 TABLET BY MOUTH ONCE DAILY. DX:E11.65 30 tablet 1  . metFORMIN (GLUCOPHAGE-XR) 500 MG 24 hr tablet Take 500 mg by mouth daily with breakfast. Take 2 tablets by mouth once daily at supper.    Glory Rosebush ULTRA test strip TEST TWO TIMES A DAY 100 strip 0  . rosuvastatin (CRESTOR) 5 MG tablet TAKE 1 TABLET BY MOUTH EVERY DAY 90 tablet 1  . Semaglutide,0.25 or 0.5MG /DOS, (OZEMPIC, 0.25 OR 0.5 MG/DOSE,) 2 MG/1.5ML SOPN Inject 0.5 mg into the skin once a week. 1.5 mL 0  . SOLIQUA 100-33 UNT-MCG/ML SOPN INJECT 30 UNITS INTO THE SKIN DAILY. 15 mL 1  . tamoxifen (NOLVADEX) 20 MG tablet Take 1  tablet (20 mg total) by mouth daily. 90 tablet 3  . triamterene-hydrochlorothiazide (MAXZIDE-25) 37.5-25 MG per tablet Take 1 tablet by mouth every morning. Reported on 09/03/2015    . Vitamin D, Ergocalciferol, (DRISDOL) 1.25 MG (50000 UNIT) CAPS capsule Take 1 capsule (50,000 Units total) by mouth every 7 (seven) days. 4 capsule 0   No current facility-administered medications for this visit.    PHYSICAL EXAMINATION: ECOG PERFORMANCE STATUS: 1 - Symptomatic but completely ambulatory  Vitals:   01/08/20 0946  BP: (!) 153/89  Pulse: 78  Resp: 16  Temp: 98.1 F (36.7 C)  SpO2: 100%   Filed  Weights   01/08/20 0946  Weight: 223 lb 12.8 oz (101.5 kg)    GENERAL:alert, no distress and comfortable SKIN: skin color, texture, turgor are normal, no rashes or significant lesions EYES: normal, Conjunctiva are pink and non-injected, sclera clear NECK: supple, thyroid normal size, non-tender, without nodularity LYMPH:  no palpable lymphadenopathy in the cervical, axillary  LUNGS: clear to auscultation and percussion with normal breathing effort HEART: regular rate & rhythm and no murmurs and no lower extremity edema ABDOMEN:abdomen soft, diffuse mild tenderness, no rebound pain, no organomegaly.  Rectal exam was negative, no blood on glove, mild hemorrhoid Musculoskeletal:no cyanosis of digits and no clubbing  NEURO: alert & oriented x 3 with fluent speech, no focal motor/sensory deficits Breasts: Breast inspection showed them to be symmetrical with no nipple discharge. Palpation of the breasts and axilla revealed no obvious mass that I could appreciate.   LABORATORY DATA:  I have reviewed the data as listed CBC Latest Ref Rng & Units 01/08/2020 10/21/2018 04/16/2018  WBC 4.0 - 10.5 K/uL 9.1 12.6(H) 12.1(H)  Hemoglobin 12.0 - 15.0 g/dL 13.5 13.4 15.0  Hematocrit 36 - 46 % 41.5 41.7 45.0  Platelets 150 - 400 K/uL 373 346 -     CMP Latest Ref Rng & Units 01/08/2020 07/28/2019 12/03/2018  Glucose 70 - 99 mg/dL 142(H) 150(H) 130(H)  BUN 8 - 23 mg/dL 8 13 10   Creatinine 0.44 - 1.00 mg/dL 0.82 0.86 0.85  Sodium 135 - 145 mmol/L 142 139 142  Potassium 3.5 - 5.1 mmol/L 3.9 3.6 3.8  Chloride 98 - 111 mmol/L 109 107 109  CO2 22 - 32 mmol/L 25 25 25   Calcium 8.9 - 10.3 mg/dL 8.7(L) 8.9 9.0  Total Protein 6.5 - 8.1 g/dL 7.4 - 7.1  Total Bilirubin 0.3 - 1.2 mg/dL 0.4 - 0.4  Alkaline Phos 38 - 126 U/L 107 - 66  AST 15 - 41 U/L 21 - 22  ALT 0 - 44 U/L 13 - 13      RADIOGRAPHIC STUDIES: I have personally reviewed the radiological images as listed and agreed with the findings in the  report. No results found.   ASSESSMENT & PLAN:  [name] is a 64 y.o. female with    1.left breast atypical lobular hyperplasia -She was diagnosed in 04/30/2014 with ADH when she underwent left nipple duct excision and surgical biopsy.  -She is on chemoprevention with tamoxifensince 11/30/14, and completed 5-year treatment October 2021.  She overall tolerated well. -Breast exam today, lab reviewed, no concern for new breast cancer. -Continue annual screening mammogram.  She would like to follow-up with [place] annually  2. Stage I endometrial cancer, 2016, new abdominal pain  -Sheunderwent total hysterectomy with BSO on 10/13/14 by Dr.Rossi -Given no other family history of malignancy, she does notmeet the criteria for genetic testing(Ipreviouslyspoke with our genetic counselor) -  She has developed diffuse abdominal pain for the past week, will see if we can move her CT AP upper   3. HTN, DM -She'll continue follow-up with her primary care physician -Her diabetes has gotten worse since she started tamoxifen, possible related, I encouraged her follow-up with her endocrinologist, eat healthy and exercise regularly.   Plan -we will call radiology to see if we can move her CT AP to a earlier date  -lab and f/u in one year, continue breast cancer surveillance   No problem-specific Assessment & Plan notes found for this encounter.   Orders Placed This Encounter  Procedures  . CT Abdomen Pelvis W Contrast    Standing Status:   Future    Standing Expiration Date:   01/07/2021    Order Specific Question:   If indicated for the ordered procedure, I authorize the administration of contrast media per Radiology protocol    Answer:   Yes    Order Specific Question:   Preferred imaging location?    Answer:   [facility]    Order Specific Question:   Is Oral Contrast requested for this exam?    Answer:   Yes, Per Radiology protocol  . MM Digital Screening    Standing  Status:   Future    Standing Expiration Date:   01/07/2021    Order Specific Question:   Reason for Exam (SYMPTOM  OR DIAGNOSIS REQUIRED)    Answer:   screening    Order Specific Question:   Preferred imaging location?    Answer:   [facility]   All questions were answered. The patient knows to call the clinic with any problems, questions or concerns. No barriers to learning was detected. The total time spent in the appointment was 30 minutes.     Truitt Merle, MD 01/08/2020   I, Joslyn Devon, am acting as scribe for Truitt Merle, MD.   I have reviewed the above documentation for accuracy and completeness, and I agree with the above.

## 2020-01-05 NOTE — Progress Notes (Signed)
Chief Complaint:   OBESITY Monica Clark is here to discuss her progress with her obesity treatment plan along with follow-up of her obesity related diagnoses. Monica Clark is on following a lower carbohydrate, vegetable and lean protein rich diet plan and states she is following her eating plan approximately 60% of the time. Monica Clark states she is doing 0 minutes 0 times per week.  Today's visit was #: 20 Starting weight: 216 lbs Starting date: 04/16/2018 Today's weight: 221 lbs Today's date: 01/01/2020 Total lbs lost to date: 0 Total lbs lost since last in-office visit: 0  Interim History: Monica Clark has been struggling to stay on track with her eating plan with increased stress and decreased meal planning. Her hunger is mostly controlled but her calories are likely above her goal. She feels she will do well over Christmas.  Subjective:   1. Type 2 diabetes mellitus with other specified complication, without long-term current use of insulin (HCC) Monica Clark states her BGs have been running higher with increased eating out and grab and go meals. She still has some GI upset with Rybelsus. She did not bring her BGs log today.  Assessment/Plan:   1. Type 2 diabetes mellitus with other specified complication, without long-term current use of insulin (HCC) Good blood sugar control is important to decrease the likelihood of diabetic complications such as nephropathy, neuropathy, limb loss, blindness, coronary artery disease, and death. Intensive lifestyle modification including diet, exercise and weight loss are the first line of treatment for diabetes. Monica Clark will hold Rybelsus, and she agreed to start Ozempic at 0.50 mg q weekly with no refills, and will follow closely.  - Semaglutide,0.25 or 0.5MG /DOS, (OZEMPIC, 0.25 OR 0.5 MG/DOSE,) 2 MG/1.5ML SOPN; Inject 0.5 mg into the skin once a week.  Dispense: 1.5 mL; Refill: 0  2. Class 3 severe obesity with serious comorbidity and body mass index (BMI) of 40.0 to 44.9  in adult, unspecified obesity type [facility]) Monica Clark is currently in the action stage of change. As such, her goal is to continue with weight loss efforts. She has agreed to keeping a food journal and adhering to recommended goals of 1200-1500 calories and 80+ grams of protein daily.   Behavioral modification strategies: holiday eating strategies .  Monica Clark has agreed to follow-up with our clinic in 3 weeks. She was informed of the importance of frequent follow-up visits to maximize her success with intensive lifestyle modifications for her multiple health conditions.   Objective:   Blood pressure (!) 142/73, pulse 72, temperature 98.2 F (36.8 C), height 5' (1.524 m), weight 221 lb (100.2 kg), SpO2 96 %. Body mass index is 43.16 kg/m.  General: Cooperative, alert, well developed, in no acute distress. HEENT: Conjunctivae and lids unremarkable. Cardiovascular: Regular rhythm.  Lungs: Normal work of breathing. Neurologic: No focal deficits.   Lab Results  Component Value Date   CREATININE 0.86 07/28/2019   BUN 13 07/28/2019   NA 139 07/28/2019   K 3.6 07/28/2019   CL 107 07/28/2019   CO2 25 07/28/2019   Lab Results  Component Value Date   ALT 13 12/03/2018   AST 22 12/03/2018   ALKPHOS 66 12/03/2018   BILITOT 0.4 12/03/2018   Lab Results  Component Value Date   HGBA1C 6.4 (A) 08/05/2019   HGBA1C 7.5 (H) 12/05/2018   HGBA1C 7.8 (H) 09/13/2018   HGBA1C 9.0 (H) 04/16/2018   HGBA1C 8.2 06/20/2017   Lab Results  Component Value Date   INSULIN 8.7 04/16/2018   Lab  Results  Component Value Date   TSH 2.220 04/16/2018   Lab Results  Component Value Date   CHOL 196 07/28/2019   HDL 32.90 (L) 07/28/2019   LDLCALC 126 (H) 04/16/2018   LDLDIRECT 121.0 07/28/2019   TRIG 211.0 (H) 07/28/2019   CHOLHDL 6 07/28/2019   Lab Results  Component Value Date   WBC 12.6 (H) 10/21/2018   HGB 13.4 10/21/2018   HCT 41.7 10/21/2018   MCV 93.5 10/21/2018   PLT 346 10/21/2018   No  results found for: IRON, TIBC, FERRITIN  Attestation Statements:   Reviewed by clinician on day of visit: allergies, medications, problem list, medical history, surgical history, family history, social history, and previous encounter notes.  Time spent on visit including pre-visit chart review and post-visit care and charting was 30 minutes.    I, Trixie Dredge, am acting as transcriptionist for Dennard Nip, MD.  I have reviewed the above documentation for accuracy and completeness, and I agree with the above. -  Dennard Nip, MD

## 2020-01-06 ENCOUNTER — Other Ambulatory Visit: Payer: Self-pay | Admitting: Family Medicine

## 2020-01-06 DIAGNOSIS — R1030 Lower abdominal pain, unspecified: Secondary | ICD-10-CM

## 2020-01-07 ENCOUNTER — Other Ambulatory Visit: Payer: Self-pay | Admitting: Hematology

## 2020-01-07 DIAGNOSIS — N6099 Unspecified benign mammary dysplasia of unspecified breast: Secondary | ICD-10-CM

## 2020-01-08 ENCOUNTER — Inpatient Hospital Stay: Payer: BC Managed Care – PPO

## 2020-01-08 ENCOUNTER — Encounter: Payer: Self-pay | Admitting: Hematology

## 2020-01-08 ENCOUNTER — Inpatient Hospital Stay: Payer: BC Managed Care – PPO | Attending: Adult Health | Admitting: Hematology

## 2020-01-08 ENCOUNTER — Other Ambulatory Visit: Payer: Self-pay

## 2020-01-08 VITALS — BP 153/89 | HR 78 | Temp 98.1°F | Resp 16 | Ht 60.0 in | Wt 223.8 lb

## 2020-01-08 DIAGNOSIS — Z8542 Personal history of malignant neoplasm of other parts of uterus: Secondary | ICD-10-CM | POA: Diagnosis not present

## 2020-01-08 DIAGNOSIS — N6099 Unspecified benign mammary dysplasia of unspecified breast: Secondary | ICD-10-CM

## 2020-01-08 DIAGNOSIS — I10 Essential (primary) hypertension: Secondary | ICD-10-CM | POA: Insufficient documentation

## 2020-01-08 DIAGNOSIS — Z79899 Other long term (current) drug therapy: Secondary | ICD-10-CM | POA: Diagnosis not present

## 2020-01-08 DIAGNOSIS — E119 Type 2 diabetes mellitus without complications: Secondary | ICD-10-CM | POA: Insufficient documentation

## 2020-01-08 DIAGNOSIS — Z1231 Encounter for screening mammogram for malignant neoplasm of breast: Secondary | ICD-10-CM

## 2020-01-08 DIAGNOSIS — R1084 Generalized abdominal pain: Secondary | ICD-10-CM | POA: Diagnosis not present

## 2020-01-08 DIAGNOSIS — Z794 Long term (current) use of insulin: Secondary | ICD-10-CM | POA: Diagnosis not present

## 2020-01-08 DIAGNOSIS — N6092 Unspecified benign mammary dysplasia of left breast: Secondary | ICD-10-CM | POA: Insufficient documentation

## 2020-01-08 DIAGNOSIS — C541 Malignant neoplasm of endometrium: Secondary | ICD-10-CM | POA: Diagnosis not present

## 2020-01-08 LAB — CBC WITH DIFFERENTIAL (CANCER CENTER ONLY)
Abs Immature Granulocytes: 0.02 10*3/uL (ref 0.00–0.07)
Basophils Absolute: 0 10*3/uL (ref 0.0–0.1)
Basophils Relative: 0 %
Eosinophils Absolute: 0.2 10*3/uL (ref 0.0–0.5)
Eosinophils Relative: 2 %
HCT: 41.5 % (ref 36.0–46.0)
Hemoglobin: 13.5 g/dL (ref 12.0–15.0)
Immature Granulocytes: 0 %
Lymphocytes Relative: 26 %
Lymphs Abs: 2.4 10*3/uL (ref 0.7–4.0)
MCH: 30 pg (ref 26.0–34.0)
MCHC: 32.5 g/dL (ref 30.0–36.0)
MCV: 92.2 fL (ref 80.0–100.0)
Monocytes Absolute: 0.4 10*3/uL (ref 0.1–1.0)
Monocytes Relative: 4 %
Neutro Abs: 6.2 10*3/uL (ref 1.7–7.7)
Neutrophils Relative %: 68 %
Platelet Count: 373 10*3/uL (ref 150–400)
RBC: 4.5 MIL/uL (ref 3.87–5.11)
RDW: 13.5 % (ref 11.5–15.5)
WBC Count: 9.1 10*3/uL (ref 4.0–10.5)
nRBC: 0 % (ref 0.0–0.2)

## 2020-01-08 LAB — CMP (CANCER CENTER ONLY)
ALT: 13 U/L (ref 0–44)
AST: 21 U/L (ref 15–41)
Albumin: 3.3 g/dL — ABNORMAL LOW (ref 3.5–5.0)
Alkaline Phosphatase: 107 U/L (ref 38–126)
Anion gap: 8 (ref 5–15)
BUN: 8 mg/dL (ref 8–23)
CO2: 25 mmol/L (ref 22–32)
Calcium: 8.7 mg/dL — ABNORMAL LOW (ref 8.9–10.3)
Chloride: 109 mmol/L (ref 98–111)
Creatinine: 0.82 mg/dL (ref 0.44–1.00)
GFR, Estimated: >60 mL/min (ref >60)
Glucose, Bld: 142 mg/dL — ABNORMAL HIGH (ref 70–99)
Potassium: 3.9 mmol/L (ref 3.5–5.1)
Sodium: 142 mmol/L (ref 135–145)
Total Bilirubin: 0.4 mg/dL (ref 0.3–1.2)
Total Protein: 7.4 g/dL (ref 6.5–8.1)

## 2020-01-08 NOTE — Progress Notes (Signed)
Ct scan abd pelvis ordered by Dr Burr Medico to expedite completion.  Scheduled at [facility] on 12/2 at 1430.  Ms Bencosme notified.  She verbalized understanding

## 2020-01-09 ENCOUNTER — Telehealth: Payer: Self-pay | Admitting: Hematology

## 2020-01-09 NOTE — Telephone Encounter (Signed)
Scheduled per 11/18 los. Mailing pt appt calendar.

## 2020-01-21 ENCOUNTER — Ambulatory Visit (HOSPITAL_COMMUNITY): Payer: BC Managed Care – PPO

## 2020-01-26 ENCOUNTER — Other Ambulatory Visit: Payer: BC Managed Care – PPO

## 2020-01-29 ENCOUNTER — Telehealth: Payer: Self-pay

## 2020-01-29 ENCOUNTER — Ambulatory Visit (INDEPENDENT_AMBULATORY_CARE_PROVIDER_SITE_OTHER): Payer: BC Managed Care – PPO | Admitting: Family Medicine

## 2020-01-29 NOTE — Telephone Encounter (Signed)
Monica Clark did not have Ct scan done 01/21/2020.  I called to remind her to rescheduled

## 2020-03-11 ENCOUNTER — Other Ambulatory Visit (INDEPENDENT_AMBULATORY_CARE_PROVIDER_SITE_OTHER): Payer: Self-pay | Admitting: Family Medicine

## 2020-03-11 DIAGNOSIS — E1169 Type 2 diabetes mellitus with other specified complication: Secondary | ICD-10-CM

## 2020-03-11 DIAGNOSIS — E559 Vitamin D deficiency, unspecified: Secondary | ICD-10-CM

## 2020-03-16 ENCOUNTER — Other Ambulatory Visit: Payer: BC Managed Care – PPO

## 2020-03-16 DIAGNOSIS — Z20822 Contact with and (suspected) exposure to covid-19: Secondary | ICD-10-CM

## 2020-03-18 LAB — SARS-COV-2, NAA 2 DAY TAT

## 2020-03-18 LAB — NOVEL CORONAVIRUS, NAA: SARS-CoV-2, NAA: NOT DETECTED

## 2020-05-03 ENCOUNTER — Other Ambulatory Visit: Payer: Self-pay | Admitting: Family Medicine

## 2020-05-03 ENCOUNTER — Ambulatory Visit
Admission: RE | Admit: 2020-05-03 | Discharge: 2020-05-03 | Disposition: A | Discharge status: Home / Self Care | Payer: BC Managed Care – PPO | Source: Ambulatory Visit | Attending: Family Medicine | Admitting: Family Medicine

## 2020-05-03 DIAGNOSIS — M25551 Pain in right hip: Secondary | ICD-10-CM

## 2020-05-03 IMAGING — DX DG HIP (WITH OR WITHOUT PELVIS) 2-3V*R*
2 series · 2 of 2 positions shown · non-contrast
Comparison: None.

CLINICAL DATA: Right hip pain.

EXAM:
DG HIP (WITH OR WITHOUT PELVIS) 2-3V RIGHT

[dg hip unilat w or w/o pelvis 2-3 views  (1 of 2)]
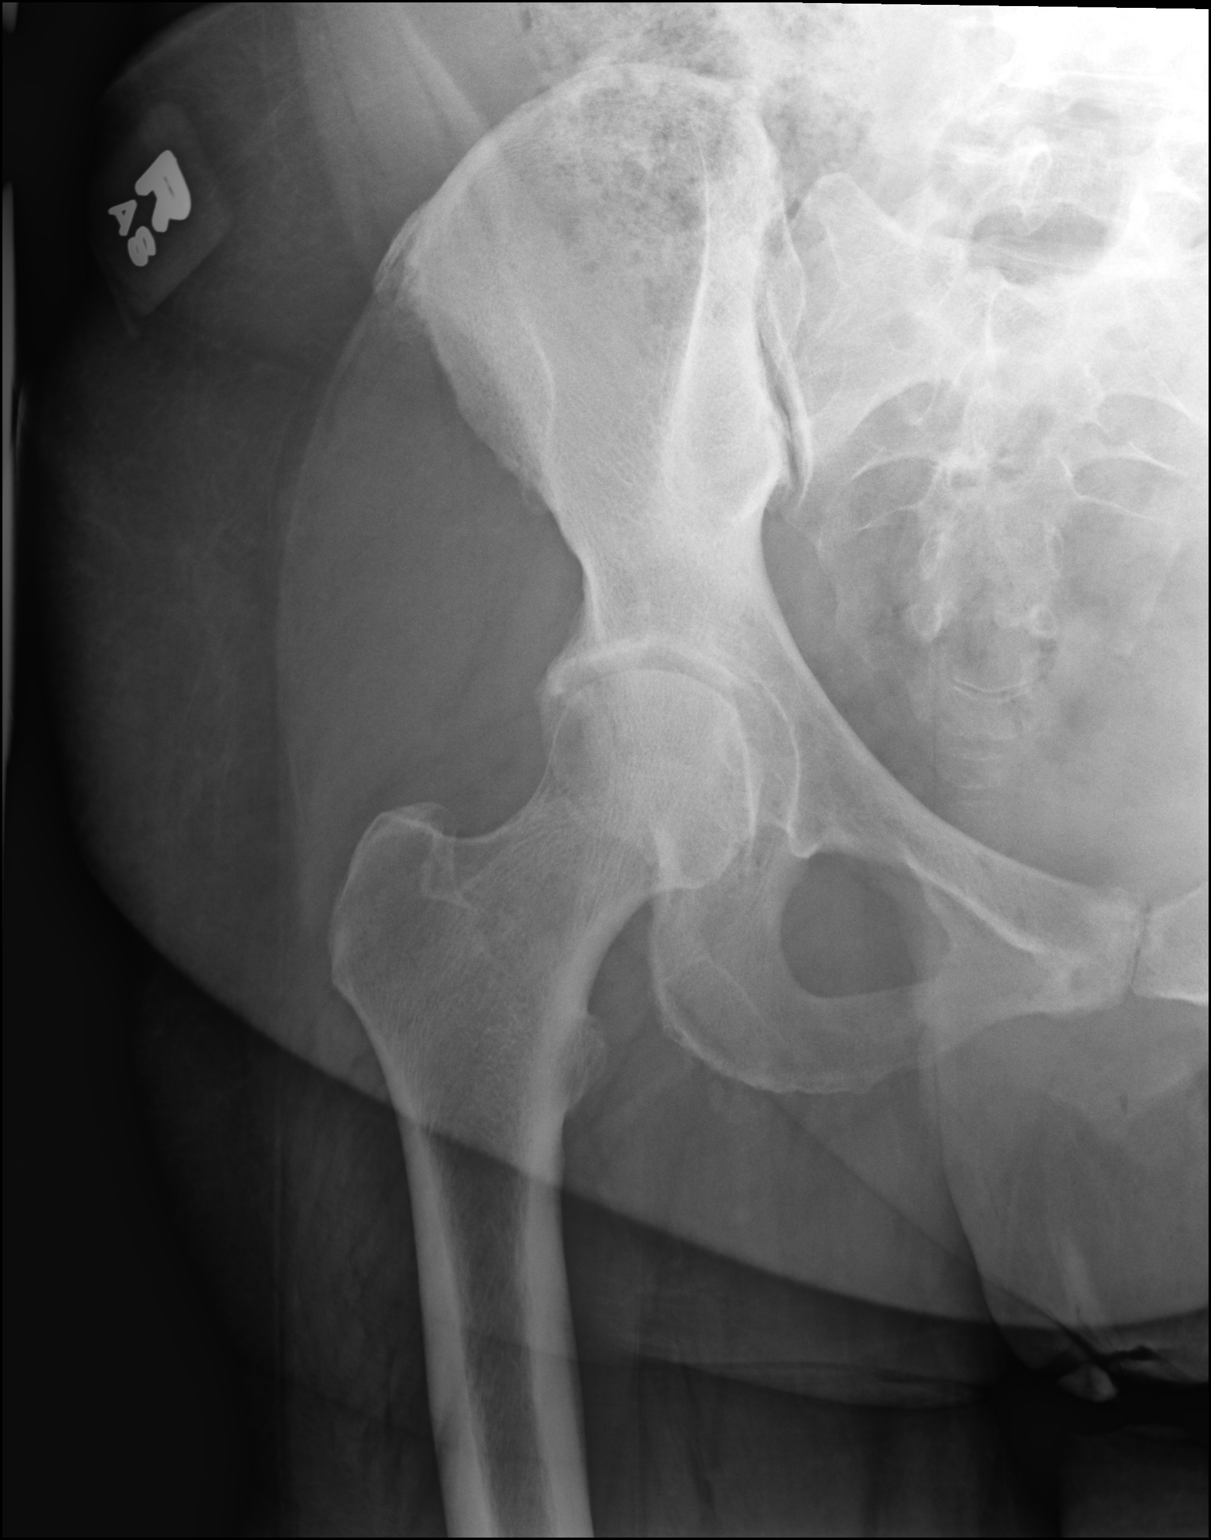

[dg hip unilat w or w/o pelvis 2-3 views  (2 of 2)]
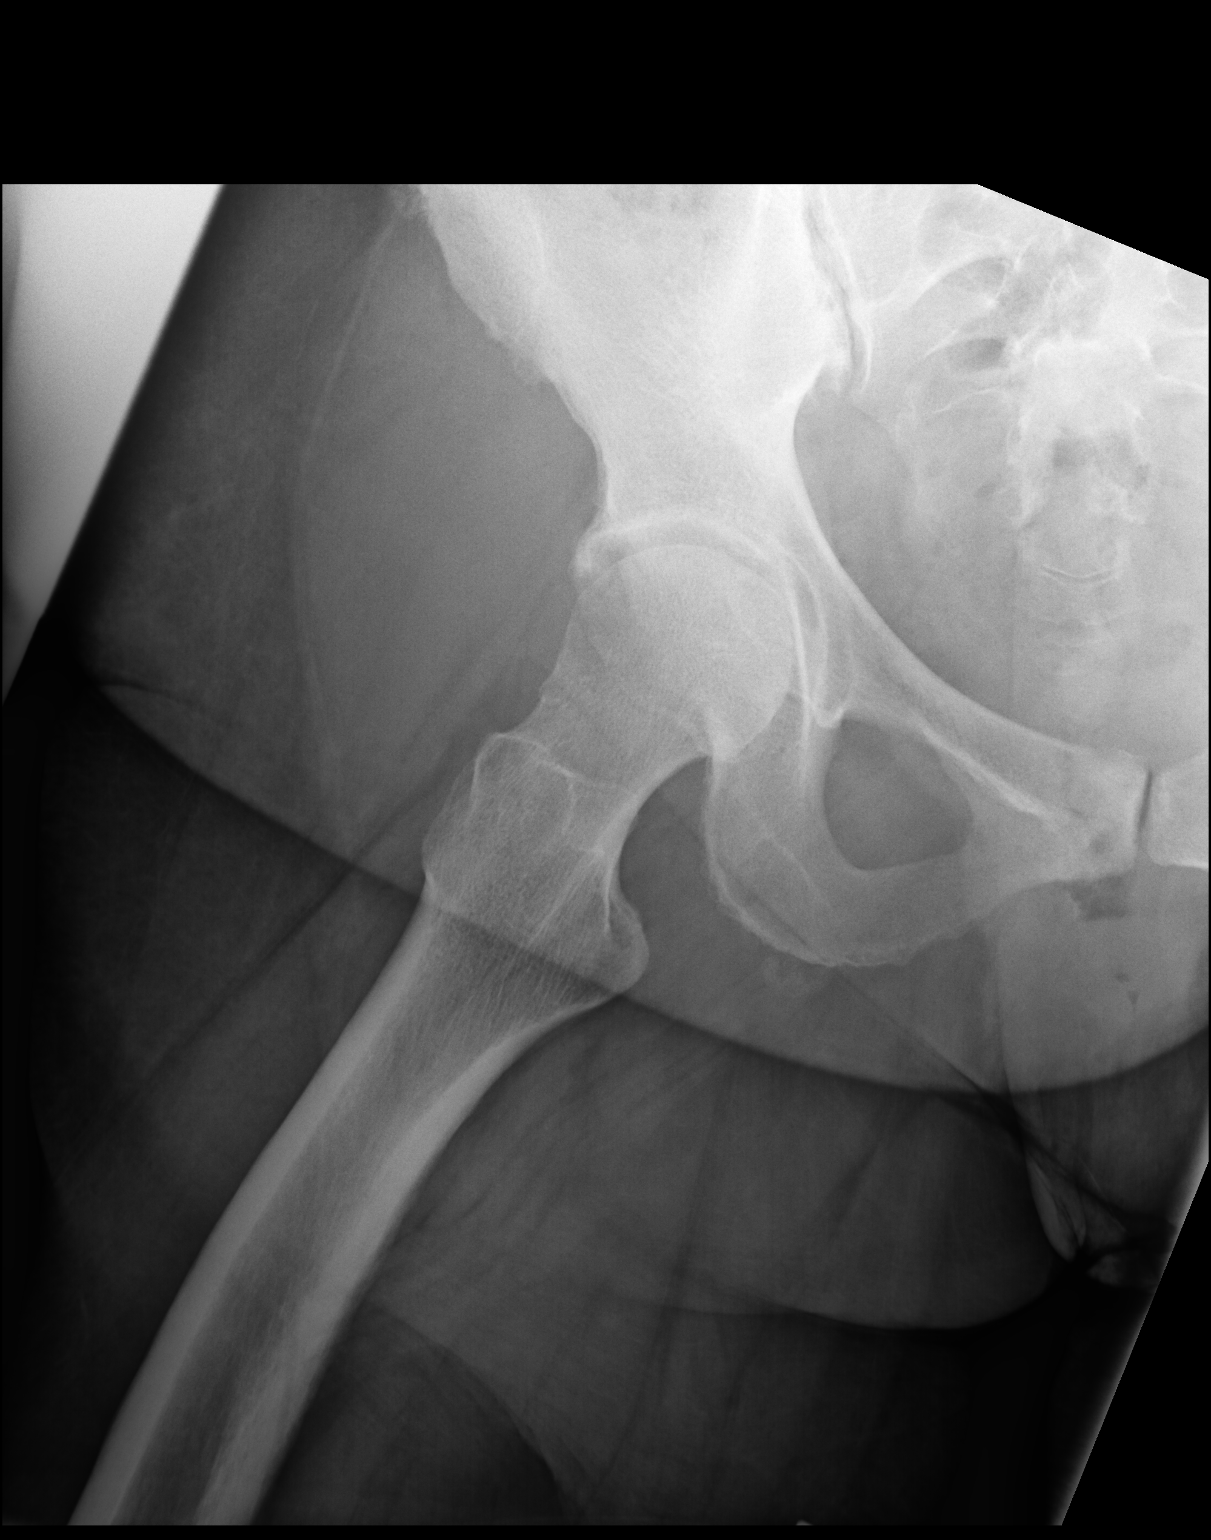

[2 of 2 positions shown; findings below may reference images not displayed]

FINDINGS: There is mild to moderate right hip osteoarthritis. There is no
acute displaced fracture. No dislocation.
IMPRESSION: Mild to moderate right hip osteoarthritis.

## 2020-05-18 ENCOUNTER — Other Ambulatory Visit: Payer: Self-pay

## 2020-05-18 ENCOUNTER — Encounter (INDEPENDENT_AMBULATORY_CARE_PROVIDER_SITE_OTHER): Payer: Self-pay | Admitting: Family Medicine

## 2020-05-18 ENCOUNTER — Ambulatory Visit (INDEPENDENT_AMBULATORY_CARE_PROVIDER_SITE_OTHER): Payer: BC Managed Care – PPO | Admitting: Family Medicine

## 2020-05-18 VITALS — BP 137/81 | HR 86 | Temp 97.7°F | Ht 60.0 in | Wt 220.0 lb

## 2020-05-18 DIAGNOSIS — E1169 Type 2 diabetes mellitus with other specified complication: Secondary | ICD-10-CM

## 2020-05-18 DIAGNOSIS — Z794 Long term (current) use of insulin: Secondary | ICD-10-CM

## 2020-05-18 DIAGNOSIS — I10 Essential (primary) hypertension: Secondary | ICD-10-CM

## 2020-05-18 DIAGNOSIS — Z9189 Other specified personal risk factors, not elsewhere classified: Secondary | ICD-10-CM

## 2020-05-18 DIAGNOSIS — Z6841 Body Mass Index (BMI) 40.0 and over, adult: Secondary | ICD-10-CM

## 2020-05-18 DIAGNOSIS — E669 Obesity, unspecified: Secondary | ICD-10-CM

## 2020-05-18 MED ORDER — OZEMPIC (0.25 OR 0.5 MG/DOSE) 2 MG/1.5ML ~~LOC~~ SOPN: 0.5000 mg | PEN_INJECTOR | SUBCUTANEOUS | 0 refills | Status: DC

## 2020-05-19 ENCOUNTER — Other Ambulatory Visit: Payer: Self-pay | Admitting: Endocrinology

## 2020-05-19 DIAGNOSIS — E1165 Type 2 diabetes mellitus with hyperglycemia: Secondary | ICD-10-CM

## 2020-05-26 ENCOUNTER — Encounter (INDEPENDENT_AMBULATORY_CARE_PROVIDER_SITE_OTHER): Payer: Self-pay | Admitting: Family Medicine

## 2020-06-07 ENCOUNTER — Ambulatory Visit (INDEPENDENT_AMBULATORY_CARE_PROVIDER_SITE_OTHER): Payer: BC Managed Care – PPO | Admitting: Family Medicine

## 2020-06-08 ENCOUNTER — Other Ambulatory Visit: Payer: Self-pay

## 2020-06-08 ENCOUNTER — Ambulatory Visit (INDEPENDENT_AMBULATORY_CARE_PROVIDER_SITE_OTHER): Payer: BC Managed Care – PPO | Admitting: Family Medicine

## 2020-06-08 ENCOUNTER — Encounter (INDEPENDENT_AMBULATORY_CARE_PROVIDER_SITE_OTHER): Payer: Self-pay | Admitting: Family Medicine

## 2020-06-08 VITALS — BP 157/89 | HR 76 | Temp 97.9°F | Ht 60.0 in | Wt 226.0 lb

## 2020-06-08 DIAGNOSIS — E1159 Type 2 diabetes mellitus with other circulatory complications: Secondary | ICD-10-CM | POA: Diagnosis not present

## 2020-06-08 DIAGNOSIS — K5903 Drug induced constipation: Secondary | ICD-10-CM | POA: Diagnosis not present

## 2020-06-08 DIAGNOSIS — I152 Hypertension secondary to endocrine disorders: Secondary | ICD-10-CM

## 2020-06-08 DIAGNOSIS — Z9189 Other specified personal risk factors, not elsewhere classified: Secondary | ICD-10-CM | POA: Diagnosis not present

## 2020-06-08 DIAGNOSIS — E1165 Type 2 diabetes mellitus with hyperglycemia: Secondary | ICD-10-CM

## 2020-06-08 DIAGNOSIS — Z6838 Body mass index (BMI) 38.0-38.9, adult: Secondary | ICD-10-CM

## 2020-06-08 MED ORDER — OZEMPIC (1 MG/DOSE) 2 MG/1.5ML ~~LOC~~ SOPN: 1.0000 mg | PEN_INJECTOR | SUBCUTANEOUS | 0 refills | Status: DC

## 2020-06-09 ENCOUNTER — Encounter (INDEPENDENT_AMBULATORY_CARE_PROVIDER_SITE_OTHER): Payer: Self-pay | Admitting: Family Medicine

## 2020-06-09 DIAGNOSIS — E1159 Type 2 diabetes mellitus with other circulatory complications: Secondary | ICD-10-CM | POA: Insufficient documentation

## 2020-06-09 DIAGNOSIS — I152 Hypertension secondary to endocrine disorders: Secondary | ICD-10-CM | POA: Insufficient documentation

## 2020-06-09 DIAGNOSIS — I10 Essential (primary) hypertension: Secondary | ICD-10-CM | POA: Insufficient documentation

## 2020-06-09 NOTE — Progress Notes (Signed)
Chief Complaint:   OBESITY Monica Clark is here to discuss her progress with her obesity treatment plan along with follow-up of her obesity related diagnoses. Monica Clark is on the Category 2 Plan and states she is following her eating plan approximately 70% of the time. Monica Clark states she is not exercising regularly.  Today's visit was #: 21 Starting weight: 216 lbs Starting date: 04/16/2018 Today's weight: 226 lbs Today's date: 06/08/2020 Total lbs lost to date: 0 Total lbs lost since last in-office visit: 0  Interim History: Monica Clark admits to not doing well on the plan.  She is eating out quite a bit. 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  She reports not being on plan at any of her meals.  She eats fast food at supper quite often.  Has oatmeal at breakfast and a frozen meal at lunch.  Subjective:   1. Type 2 diabetes mellitus with hyperglycemia, without long-term current use of insulin (HCC) Taking Ozempic 0.5 mg and Jardiance currently.  Last A1c 7.7.  She is also prescribed glargine/lixisenatide by Dr. Dwyane Dee, but he will not refill because she has not seen him in a while.  She also has glimepiride on her list, but is not taking that. She stopped metformin due to diarrhea. Denies hypoglycemia.  Not checking CBGs.   She feels that Rybelsus controls her blood sugars better than Ozempic.   Lab Results  Component Value Date   HGBA1C 6.4 (A) 08/05/2019   HGBA1C 7.5 (H) 12/05/2018   HGBA1C 7.8 (H) 09/13/2018   Lab Results  Component Value Date   MICROALBUR 0.8 10/26/2016   LDLCALC 126 (H) 04/16/2018   CREATININE 0.82 01/08/2020   Lab Results  Component Value Date   INSULIN 8.7 04/16/2018   2. Hypertension associated with type 2 diabetes mellitus ([place]) Blood pressure elevated today.  On Maxzide 37.5-25 daily.  BP Readings from Last 3 Encounters:  06/08/20 (!) 157/89  05/18/20 137/81  01/08/20 (!) 153/89   3. Drug-induced constipation Notes mild  constipation.  May experience increased constipation with increased dose of Ozempic.  4. At risk for side effect of medication Monica Clark is at risk for side effect of medication due to increasing her dose of Ozempic (nausea, increased constipation).  Assessment/Plan:   1. Type 2 diabetes mellitus with hyperglycemia, without long-term current use of insulin (HCC) Increase dose of Ozempic to 1 mg subcutaneously weekly.  Continue Jardiance. I advised that Ozempic will help with appetite more than Rybelsus and that blood sugar control will improve as we titrate up dose of Ozempic.  Discussed importance of reducing simple carbs in diet for controlling blood sugars.   - Increase Semaglutide, 1 MG/DOSE, (OZEMPIC, 1 MG/DOSE,) 2 MG/1.5ML SOPN; Inject 1 mg into the skin once a week.  Dispense: 1.5 mL; Refill: 0  2. Hypertension associated with type 2 diabetes mellitus ([place]) Continue to monitor.   3. Drug-induced constipation MiraLAX every other day up to daily as needed.  Increase water intake.  4. At risk for side effect of medication Tiari was given approximately 15 minutes of drug side effect counseling today.  We discussed side effect possibility and risk versus benefits. Ardeth agreed to the medication and will contact this office if these side effects are intolerable.  Repetitive spaced learning was employed today to elicit superior memory formation and behavioral change.  5. Obesity with current BMI of 44  Monica Clark is currently in the action stage of change. As  such, her goal is to continue with weight loss efforts. She has agreed to the Category 2 Plan and keeping a food journal and adhering to recommended goals of 400-500 calories and 35 grams of protein at supper.   Exercise goals: No exercise has been prescribed at this time.  Behavioral modification strategies: decreasing simple carbohydrates, decreasing eating out, no skipping meals and meal planning and cooking strategies.  Handouts  provided:  Journaling, Eating Out.  She will concentrate on eating on plan at breakfast and lunch, especially.  Monica Clark has agreed to follow-up with our clinic in 3 weeks, fasting, with Dr. Leafy Ro. She was informed of the importance of frequent follow-up visits to maximize her success with intensive lifestyle modifications for her multiple health conditions.   Objective:   Blood pressure (!) 157/89, pulse 76, temperature 97.9 F (36.6 C), height 5' (1.524 m), weight 226 lb (102.5 kg), SpO2 99 %. Body mass index is 44.14 kg/m.  General: Cooperative, alert, well developed, in no acute distress. HEENT: Conjunctivae and lids unremarkable. Cardiovascular: Regular rhythm.  Lungs: Normal work of breathing. Neurologic: No focal deficits.   Lab Results  Component Value Date   CREATININE 0.82 01/08/2020   BUN 8 01/08/2020   NA 142 01/08/2020   K 3.9 01/08/2020   CL 109 01/08/2020   CO2 25 01/08/2020   Lab Results  Component Value Date   ALT 13 01/08/2020   AST 21 01/08/2020   ALKPHOS 107 01/08/2020   BILITOT 0.4 01/08/2020   Lab Results  Component Value Date   HGBA1C 6.4 (A) 08/05/2019   HGBA1C 7.5 (H) 12/05/2018   HGBA1C 7.8 (H) 09/13/2018   HGBA1C 9.0 (H) 04/16/2018   HGBA1C 8.2 06/20/2017   Lab Results  Component Value Date   INSULIN 8.7 04/16/2018   Lab Results  Component Value Date   TSH 2.220 04/16/2018   Lab Results  Component Value Date   CHOL 196 07/28/2019   HDL 32.90 (L) 07/28/2019   LDLCALC 126 (H) 04/16/2018   LDLDIRECT 121.0 07/28/2019   TRIG 211.0 (H) 07/28/2019   CHOLHDL 6 07/28/2019   Lab Results  Component Value Date   WBC 9.1 01/08/2020   HGB 13.5 01/08/2020   HCT 41.5 01/08/2020   MCV 92.2 01/08/2020   PLT 373 01/08/2020   Attestation Statements:   Reviewed by clinician on day of visit: allergies, medications, problem list, medical history, surgical history, family history, social history, and previous encounter notes.  I, Water quality scientist,  CMA, am acting as Location manager for [employer], [place].  I have reviewed the above documentation for accuracy and completeness, and I agree with the above. -  Georgianne Fick, FNP

## 2020-06-16 NOTE — Progress Notes (Signed)
Chief Complaint:   OBESITY Monica Clark is here to discuss her progress with her obesity treatment plan along with follow-up of her obesity related diagnoses. Monica Clark is on keeping a food journal and adhering to recommended goals of 1200-1500 calories and 80+ grams of protein daily and states she is following her eating plan approximately 0% of the time. Monica Clark states she is doing 0 minutes 0 times per week.  Today's visit was #: 21 Starting weight: 216 lbs Starting date: 04/16/2018 Today's weight: 220 lbs Today's date: 05/18/2020 Total lbs lost to date: 0 Total lbs lost since last in-office visit: 1  Interim History: Monica Clark's last visit was 4-5 months ago. She is ready to start back with her weight loss efforts. Her lowest weight in the program was 197 lbs.  Subjective:   1. Type 2 diabetes mellitus with other specified complication, with long-term current use of insulin (HCC) Monica Clark has been off track with her eating and medications. She is ready to work on her health and restart medications. She is already on [place] which has a GLP-1, but could use a higher dose to help with weight loss. She has tolerated both in the past. She denies a history of pancreatitis.   2. Essential hypertension Monica Clark's blood pressure is stable on her medications. She is ready to work on weight loss again. She denies chest pain or lightheadedness.  3. At risk for heart disease Monica Clark is at a higher than average risk for cardiovascular disease due to obesity.   Assessment/Plan:   1. Type 2 diabetes mellitus with other specified complication, with long-term current use of insulin (HCC) We will refill Ozempic 0.5 mg weekly for 1 month. Kairi will restart her diet prescription, and will continue metformin, Jardiance, and Soliqua, and will monitor closely. Good blood sugar control is important to decrease the likelihood of diabetic complications such as nephropathy, neuropathy, limb loss, blindness, coronary artery  disease, and death. Intensive lifestyle modification including diet, exercise and weight loss are the first line of treatment for diabetes.   2. Essential hypertension Monica Clark will restart her diet, exercise, and weight loss to improve blood pressure control. We will recheck her blood pressure in 3 to 4 weeks, and will watch for signs of hypotension as she continues her lifestyle modifications.  3. At risk for heart disease Monica Clark was given approximately 15 minutes of coronary artery disease prevention counseling today. She is 65 y.o. female and has risk factors for heart disease including obesity. We discussed intensive lifestyle modifications today with an emphasis on specific weight loss instructions and strategies.   Repetitive spaced learning was employed today to elicit superior memory formation and behavioral change.  4. Obesity with current BMI of 43.0 Monica Clark is currently in the action stage of change. As such, her goal is to continue with weight loss efforts. She has agreed to restart the Category 2 Plan.   Monica Clark's goal is to get back to below 200 lbs.  Behavioral modification strategies: increasing lean protein intake and meal planning and cooking strategies.  Monica Clark has agreed to follow-up with our clinic in 3 weeks. She was informed of the importance of frequent follow-up visits to maximize her success with intensive lifestyle modifications for her multiple health conditions.   Objective:   Blood pressure 137/81, pulse 86, temperature 97.7 F (36.5 C), height 5' (1.524 m), weight 220 lb (99.8 kg), SpO2 98 %. Body mass index is 42.97 kg/m.  General: Cooperative, alert, well developed, in no acute  distress. HEENT: Conjunctivae and lids unremarkable. Cardiovascular: Regular rhythm.  Lungs: Normal work of breathing. Neurologic: No focal deficits.   Lab Results  Component Value Date   CREATININE 0.82 01/08/2020   BUN 8 01/08/2020   NA 142 01/08/2020   K 3.9 01/08/2020   CL 109  01/08/2020   CO2 25 01/08/2020   Lab Results  Component Value Date   ALT 13 01/08/2020   AST 21 01/08/2020   ALKPHOS 107 01/08/2020   BILITOT 0.4 01/08/2020   Lab Results  Component Value Date   HGBA1C 6.4 (A) 08/05/2019   HGBA1C 7.5 (H) 12/05/2018   HGBA1C 7.8 (H) 09/13/2018   HGBA1C 9.0 (H) 04/16/2018   HGBA1C 8.2 06/20/2017   Lab Results  Component Value Date   INSULIN 8.7 04/16/2018   Lab Results  Component Value Date   TSH 2.220 04/16/2018   Lab Results  Component Value Date   CHOL 196 07/28/2019   HDL 32.90 (L) 07/28/2019   LDLCALC 126 (H) 04/16/2018   LDLDIRECT 121.0 07/28/2019   TRIG 211.0 (H) 07/28/2019   CHOLHDL 6 07/28/2019   Lab Results  Component Value Date   WBC 9.1 01/08/2020   HGB 13.5 01/08/2020   HCT 41.5 01/08/2020   MCV 92.2 01/08/2020   PLT 373 01/08/2020   No results found for: IRON, TIBC, FERRITIN  Attestation Statements:   Reviewed by clinician on day of visit: allergies, medications, problem list, medical history, surgical history, family history, social history, and previous encounter notes.   I, Trixie Dredge, am acting as transcriptionist for Dennard Nip, MD.  I have reviewed the above documentation for accuracy and completeness, and I agree with the above. -  Dennard Nip, MD

## 2020-06-28 ENCOUNTER — Ambulatory Visit (INDEPENDENT_AMBULATORY_CARE_PROVIDER_SITE_OTHER): Payer: BC Managed Care – PPO | Admitting: Family Medicine

## 2020-06-28 ENCOUNTER — Other Ambulatory Visit: Payer: Self-pay

## 2020-06-28 ENCOUNTER — Encounter (INDEPENDENT_AMBULATORY_CARE_PROVIDER_SITE_OTHER): Payer: Self-pay | Admitting: Family Medicine

## 2020-06-28 VITALS — BP 157/87 | HR 80 | Temp 98.0°F | Ht 60.0 in | Wt 220.0 lb

## 2020-06-28 DIAGNOSIS — E7849 Other hyperlipidemia: Secondary | ICD-10-CM

## 2020-06-28 DIAGNOSIS — I10 Essential (primary) hypertension: Secondary | ICD-10-CM

## 2020-06-28 DIAGNOSIS — Z9189 Other specified personal risk factors, not elsewhere classified: Secondary | ICD-10-CM | POA: Diagnosis not present

## 2020-06-28 DIAGNOSIS — E559 Vitamin D deficiency, unspecified: Secondary | ICD-10-CM

## 2020-06-28 DIAGNOSIS — E1165 Type 2 diabetes mellitus with hyperglycemia: Secondary | ICD-10-CM

## 2020-06-28 DIAGNOSIS — Z6838 Body mass index (BMI) 38.0-38.9, adult: Secondary | ICD-10-CM

## 2020-06-29 LAB — CMP14+EGFR
ALT: 17 IU/L (ref 0–32)
AST: 17 IU/L (ref 0–40)
Albumin/Globulin Ratio: 1.5 (ref 1.2–2.2)
Albumin: 4.4 g/dL (ref 3.8–4.8)
Alkaline Phosphatase: 118 IU/L (ref 44–121)
BUN/Creatinine Ratio: 17 (ref 12–28)
BUN: 12 mg/dL (ref 8–27)
Bilirubin Total: 0.3 mg/dL (ref 0.0–1.2)
CO2: 21 mmol/L (ref 20–29)
Calcium: 9.6 mg/dL (ref 8.7–10.3)
Chloride: 102 mmol/L (ref 96–106)
Creatinine, Ser: 0.71 mg/dL (ref 0.57–1.00)
Globulin, Total: 2.9 g/dL (ref 1.5–4.5)
Glucose: 238 mg/dL — ABNORMAL HIGH (ref 65–99)
Potassium: 4.1 mmol/L (ref 3.5–5.2)
Sodium: 139 mmol/L (ref 134–144)
Total Protein: 7.3 g/dL (ref 6.0–8.5)
eGFR: 95 mL/min/{1.73_m2} (ref >59)

## 2020-06-29 LAB — LIPID PANEL WITH LDL/HDL RATIO
Cholesterol, Total: 202 mg/dL — ABNORMAL HIGH (ref 100–199)
HDL: 33 mg/dL — ABNORMAL LOW (ref >39)
LDL Chol Calc (NIH): 120 mg/dL — ABNORMAL HIGH (ref 0–99)
LDL/HDL Ratio: 3.6 ratio — ABNORMAL HIGH (ref 0.0–3.2)
Triglycerides: 277 mg/dL — ABNORMAL HIGH (ref 0–149)
VLDL Cholesterol Cal: 49 mg/dL — ABNORMAL HIGH (ref 5–40)

## 2020-06-29 LAB — HEMOGLOBIN A1C
Est. average glucose Bld gHb Est-mCnc: 269 mg/dL
Hgb A1c MFr Bld: 11 % — ABNORMAL HIGH (ref 4.8–5.6)

## 2020-06-29 LAB — VITAMIN D 25 HYDROXY (VIT D DEFICIENCY, FRACTURES): Vit D, 25-Hydroxy: 12.6 ng/mL — ABNORMAL LOW (ref 30.0–100.0)

## 2020-06-29 LAB — INSULIN, RANDOM: INSULIN: 27.9 u[IU]/mL — ABNORMAL HIGH (ref 2.6–24.9)

## 2020-06-29 NOTE — Progress Notes (Signed)
Chief Complaint:   OBESITY Monica Clark is here to discuss her progress with her obesity treatment plan along with follow-up of her obesity related diagnoses. Monica Clark is on the Category 2 Plan and keeping a food journal and adhering to recommended goals of 400-500 calories and 35 grams of protein at supper daily and states she is following her eating plan approximately 70% of the time. Monica Clark states she is doing 0 minutes 0 times per week.  Today's visit was #: 22 Starting weight: 216 lbs Starting date: 04/16/2018 Today's weight: 220 lbs Today's date: 06/28/2020 Total lbs lost to date: 0 Total lbs lost since last in-office visit: 6  Interim History: Monica Clark is doing better with diet, exercise, and weight loss. Part of her weight loss may be due to dehydration as her blood sugars are not controlled currently.  Subjective:   1. Type 2 diabetes mellitus with hyperglycemia, without long-term current use of insulin (HCC) Monica Clark is not on Soliqua, and she is on Ozempic at 1.0 mg now. She states her fasting BGs are close to 200 even with diet and weight loss. She is due to follow up with her Endocrinologist and she is due for labs as well.  2. Other hyperlipidemia Monica Clark is working on diet and exercise, and she is due for labs.  3. Vitamin D deficiency Monica Clark is on Vit D, and she is due for labs.  4. Essential hypertension Monica Clark's blood pressure is elevated today, and she is working on diet and exercise. She is on Maxzide.  5. At high risk for dehydration Monica Clark is at risk for dehydration due to inadequate water intake.  Assessment/Plan:   1. Type 2 diabetes mellitus with hyperglycemia, without long-term current use of insulin (HCC) Good blood sugar control is important to decrease the likelihood of diabetic complications such as nephropathy, neuropathy, limb loss, blindness, coronary artery disease, and death. Intensive lifestyle modification including diet, exercise and weight loss are the first  line of treatment for diabetes.   - CMP14+EGFR - Hemoglobin A1c - Insulin, random  2. Other hyperlipidemia Cardiovascular risk and specific lipid/LDL goals reviewed. We discussed several lifestyle modifications today. We will check labs today. Jaydalynn will continue to work on diet, exercise and weight loss efforts. Orders and follow up as documented in patient record.   - Lipid Panel With LDL/HDL Ratio  3. Vitamin D deficiency Low Vitamin D level contributes to fatigue and are associated with obesity, breast, and colon cancer. We will check labs today. Monica Clark will follow-up for routine testing of Vitamin D, at least 2-3 times per year to avoid over-replacement.  - VITAMIN D 25 Hydroxy (Vit-D Deficiency, Fractures)  4. Essential hypertension We will check labs today. [name] will continue Maxzide and weight loss, and we will recheck her blood pressure in 3 weeks. If no improvement then we will consider adjusting her medication dose.    5. At high risk for dehydration Monica Clark was given approximately 15 minutes dehydration prevention counseling today. Monica Clark is at risk for dehydration due to weight loss and current medication(s). She was encouraged to hydrate and monitor fluid status to avoid dehydration as well as weight loss plateaus.   6. Obesity with current BMI 43.1 Monica Clark is currently in the action stage of change. As such, her goal is to continue with weight loss efforts. She has agreed to practicing portion control and making smarter food choices, such as increasing vegetables and decreasing simple carbohydrates.   Behavioral modification strategies: increasing water intake.  Monica Clark has agreed to follow-up with our clinic in 3 weeks. She was informed of the importance of frequent follow-up visits to maximize her success with intensive lifestyle modifications for her multiple health conditions.   Monica Clark was informed we would discuss her lab results at her next visit unless there is a critical  issue that needs to be addressed sooner. Monica Clark agreed to keep her next visit at the agreed upon time to discuss these results.  Objective:   Blood pressure (!) 157/87, pulse 80, temperature 98 F (36.7 C), height 5' (1.524 m), weight 220 lb (99.8 kg), SpO2 96 %. Body mass index is 42.97 kg/m.  General: Cooperative, alert, well developed, in no acute distress. HEENT: Conjunctivae and lids unremarkable. Cardiovascular: Regular rhythm.  Lungs: Normal work of breathing. Neurologic: No focal deficits.   Lab Results  Component Value Date   CREATININE 0.71 06/28/2020   BUN 12 06/28/2020   NA 139 06/28/2020   K 4.1 06/28/2020   CL 102 06/28/2020   CO2 21 06/28/2020   Lab Results  Component Value Date   ALT 17 06/28/2020   AST 17 06/28/2020   ALKPHOS 118 06/28/2020   BILITOT 0.3 06/28/2020   Lab Results  Component Value Date   HGBA1C 11.0 (H) 06/28/2020   HGBA1C 6.4 (A) 08/05/2019   HGBA1C 7.5 (H) 12/05/2018   HGBA1C 7.8 (H) 09/13/2018   HGBA1C 9.0 (H) 04/16/2018   Lab Results  Component Value Date   INSULIN 27.9 (H) 06/28/2020   INSULIN 8.7 04/16/2018   Lab Results  Component Value Date   TSH 2.220 04/16/2018   Lab Results  Component Value Date   CHOL 202 (H) 06/28/2020   HDL 33 (L) 06/28/2020   LDLCALC 120 (H) 06/28/2020   LDLDIRECT 121.0 07/28/2019   TRIG 277 (H) 06/28/2020   CHOLHDL 6 07/28/2019   Lab Results  Component Value Date   WBC 9.1 01/08/2020   HGB 13.5 01/08/2020   HCT 41.5 01/08/2020   MCV 92.2 01/08/2020   PLT 373 01/08/2020   No results found for: IRON, TIBC, FERRITIN  Attestation Statements:   Reviewed by clinician on day of visit: allergies, medications, problem list, medical history, surgical history, family history, social history, and previous encounter notes.   I, Trixie Dredge, am acting as transcriptionist for Dennard Nip, MD.  I have reviewed the above documentation for accuracy and completeness, and I agree with the above.  -  Dennard Nip, MD

## 2020-07-07 ENCOUNTER — Other Ambulatory Visit (INDEPENDENT_AMBULATORY_CARE_PROVIDER_SITE_OTHER): Payer: Self-pay | Admitting: Family Medicine

## 2020-07-07 DIAGNOSIS — E1165 Type 2 diabetes mellitus with hyperglycemia: Secondary | ICD-10-CM

## 2020-07-07 NOTE — Telephone Encounter (Signed)
Dr.Beasley 

## 2020-07-13 NOTE — Telephone Encounter (Signed)
Patient is requesting a refill of the following medications: Requested Prescriptions   Pending Prescriptions Disp Refills   OZEMPIC, 1 MG/DOSE, 4 MG/3ML SOPN [Pharmacy Med Name: OZEMPIC 1 MG/DOSE (4 MG/3 ML)]      Sig: INJECT 1 MG INTO THE SKIN ONCE A WEEK.    Last office visit: 06/28/20 Date of last refill: 06/08/20 Last refill amount: 1.5 ml Follow up time period per chart: 07/20/20 Next appt scheduled: 07/20/20

## 2020-07-13 NOTE — Telephone Encounter (Signed)
Ok x 1

## 2020-07-20 ENCOUNTER — Ambulatory Visit (INDEPENDENT_AMBULATORY_CARE_PROVIDER_SITE_OTHER): Payer: BC Managed Care – PPO | Admitting: Family Medicine

## 2020-07-20 ENCOUNTER — Encounter (INDEPENDENT_AMBULATORY_CARE_PROVIDER_SITE_OTHER): Payer: Self-pay | Admitting: Family Medicine

## 2020-07-20 ENCOUNTER — Other Ambulatory Visit: Payer: Self-pay

## 2020-07-20 VITALS — BP 122/80 | HR 88 | Temp 98.1°F | Ht 60.0 in | Wt 211.0 lb

## 2020-07-20 DIAGNOSIS — E1169 Type 2 diabetes mellitus with other specified complication: Secondary | ICD-10-CM

## 2020-07-20 DIAGNOSIS — Z6838 Body mass index (BMI) 38.0-38.9, adult: Secondary | ICD-10-CM

## 2020-07-20 DIAGNOSIS — Z9189 Other specified personal risk factors, not elsewhere classified: Secondary | ICD-10-CM

## 2020-07-20 DIAGNOSIS — E1165 Type 2 diabetes mellitus with hyperglycemia: Secondary | ICD-10-CM

## 2020-07-20 DIAGNOSIS — E7849 Other hyperlipidemia: Secondary | ICD-10-CM | POA: Diagnosis not present

## 2020-07-20 DIAGNOSIS — E559 Vitamin D deficiency, unspecified: Secondary | ICD-10-CM | POA: Diagnosis not present

## 2020-07-20 MED ORDER — VITAMIN D (ERGOCALCIFEROL) 1.25 MG (50000 UNIT) PO CAPS: 50000.0000 [IU] | ORAL_CAPSULE | ORAL | 0 refills | Status: DC

## 2020-08-04 NOTE — Progress Notes (Signed)
Chief Complaint:   OBESITY Monica Clark is here to discuss her progress with her obesity treatment plan along with follow-up of her obesity related diagnoses. Monica Clark is on practicing portion control and making smarter food choices, such as increasing vegetables and decreasing simple carbohydrates and states she is following her eating plan approximately 80% of the time. Monica Clark states she is doing 0 minutes 0 times per week.  Today's visit was #: 23 Starting weight: 216 lbs Starting date: 04/16/2018 Today's weight: 211 lbs Today's date: 07/20/2020 Total lbs lost to date: 5 Total lbs lost since last in-office visit: 9  Interim History: Monica Clark has done much better with weight loss in the last 3 weeks. She restarted her GLP-1 and her hunger is better controlled. She is getting back to her routine and meal planning better as well.  Subjective:   1. Type 2 diabetes mellitus with other specified complication, without long-term current use of insulin (HCC) Monica Clark has been off [place] and Ozempic and metformin. She is no longer seeing her Endocrinologist, and her A1c is worsening and has gone completely out of control and it is now 11.0. I discussed labs with the patient today.  2. Vitamin D deficiency Monica Clark's Vit D is very low, but she has been off of her Vit D and she notes her fatigue has increased. I discussed labs with the patient today.  3. Other hyperlipidemia Monica Clark hasn't been taking her Crestor regularly and her LDL has increased. She is trying to get back on track. I discussed labs with the patient today.  4. At risk for heart disease Monica Clark is at a higher than average risk for cardiovascular disease due to obesity.   Assessment/Plan:   1. Type 2 diabetes mellitus with other specified complication, without long-term current use of insulin (HCC) Jordann is getting back on track with her eating and restarted Ozempic. She will keep a glucose and keep close track of her readings. We will  increase Ozempic to 2.0 mg in another month if tolerated. She agreed to make an appointment with another Endocrinologist to establish care. Good blood sugar control is important to decrease the likelihood of diabetic complications such as nephropathy, neuropathy, limb loss, blindness, coronary artery disease, and death. Intensive lifestyle modification including diet, exercise and weight loss are the first line of treatment for diabetes.   2. Vitamin D deficiency Low Vitamin D level contributes to fatigue and are associated with obesity, breast, and colon cancer. We will refill prescription Vitamin D for 1 month, and we will recheck labs in 3 months. Monica Clark will follow-up for routine testing of Vitamin D, at least 2-3 times per year to avoid over-replacement.  - Vitamin D, Ergocalciferol, (DRISDOL) 1.25 MG (50000 UNIT) CAPS capsule; Take 1 capsule (50,000 Units total) by mouth every 7 (seven) days.  Dispense: 4 capsule; Refill: 0  3. Other hyperlipidemia Cardiovascular risk and specific lipid/LDL goals reviewed. We discussed several lifestyle modifications today. Monica Clark was strongly encouraged to get back to taking all her medicines regularly, and that she is currently at high risk of myocardial infarction or cardiovascular accident. She agreed to work on this, and will continue to work on diet, exercise and weight loss efforts. We will recheck labs in 3 months. Orders and follow up as documented in patient record.   4. At risk for heart disease Monica Clark was given approximately 15 minutes of coronary artery disease prevention counseling today. She is 65 y.o. female and has risk factors for heart disease  including obesity. We discussed intensive lifestyle modifications today with an emphasis on specific weight loss instructions and strategies.   Repetitive spaced learning was employed today to elicit superior memory formation and behavioral change.  5. Obesity with current BMI 41.2 Monica Clark is currently in  the action stage of change. As such, her goal is to continue with weight loss efforts. She has agreed to following a lower carbohydrate, vegetable and lean protein rich diet plan.   Behavioral modification strategies: meal planning and cooking strategies.  Monica Clark has agreed to follow-up with our clinic in 4 weeks. She was informed of the importance of frequent follow-up visits to maximize her success with intensive lifestyle modifications for her multiple health conditions.   Objective:   Blood pressure 122/80, pulse 88, temperature 98.1 F (36.7 C), height 5' (1.524 m), weight 211 lb (95.7 kg), SpO2 97 %. Body mass index is 41.21 kg/m.  General: Cooperative, alert, well developed, in no acute distress. HEENT: Conjunctivae and lids unremarkable. Cardiovascular: Regular rhythm.  Lungs: Normal work of breathing. Neurologic: No focal deficits.   Lab Results  Component Value Date   CREATININE 0.71 06/28/2020   BUN 12 06/28/2020   NA 139 06/28/2020   K 4.1 06/28/2020   CL 102 06/28/2020   CO2 21 06/28/2020   Lab Results  Component Value Date   ALT 17 06/28/2020   AST 17 06/28/2020   ALKPHOS 118 06/28/2020   BILITOT 0.3 06/28/2020   Lab Results  Component Value Date   HGBA1C 11.0 (H) 06/28/2020   HGBA1C 6.4 (A) 08/05/2019   HGBA1C 7.5 (H) 12/05/2018   HGBA1C 7.8 (H) 09/13/2018   HGBA1C 9.0 (H) 04/16/2018   Lab Results  Component Value Date   INSULIN 27.9 (H) 06/28/2020   INSULIN 8.7 04/16/2018   Lab Results  Component Value Date   TSH 2.220 04/16/2018   Lab Results  Component Value Date   CHOL 202 (H) 06/28/2020   HDL 33 (L) 06/28/2020   LDLCALC 120 (H) 06/28/2020   LDLDIRECT 121.0 07/28/2019   TRIG 277 (H) 06/28/2020   CHOLHDL 6 07/28/2019   Lab Results  Component Value Date   WBC 9.1 01/08/2020   HGB 13.5 01/08/2020   HCT 41.5 01/08/2020   MCV 92.2 01/08/2020   PLT 373 01/08/2020   No results found for: IRON, TIBC, FERRITIN  Attestation Statements:    Reviewed by clinician on day of visit: allergies, medications, problem list, medical history, surgical history, family history, social history, and previous encounter notes.   I, Trixie Dredge, am acting as transcriptionist for Dennard Nip, MD.  I have reviewed the above documentation for accuracy and completeness, and I agree with the above. -  Dennard Nip, MD

## 2020-08-10 ENCOUNTER — Ambulatory Visit (INDEPENDENT_AMBULATORY_CARE_PROVIDER_SITE_OTHER): Payer: BC Managed Care – PPO | Admitting: Family Medicine

## 2020-08-10 ENCOUNTER — Encounter (INDEPENDENT_AMBULATORY_CARE_PROVIDER_SITE_OTHER): Payer: Self-pay

## 2020-08-11 ENCOUNTER — Other Ambulatory Visit: Payer: Self-pay | Admitting: Endocrinology

## 2020-08-11 DIAGNOSIS — E1165 Type 2 diabetes mellitus with hyperglycemia: Secondary | ICD-10-CM

## 2020-08-16 ENCOUNTER — Other Ambulatory Visit (INDEPENDENT_AMBULATORY_CARE_PROVIDER_SITE_OTHER): Payer: BC Managed Care – PPO

## 2020-08-16 ENCOUNTER — Other Ambulatory Visit: Payer: Self-pay

## 2020-08-16 DIAGNOSIS — E1165 Type 2 diabetes mellitus with hyperglycemia: Secondary | ICD-10-CM

## 2020-08-16 LAB — LIPID PANEL
Cholesterol: 150 mg/dL (ref 0–200)
HDL: 30.7 mg/dL — ABNORMAL LOW (ref >39.00)
NonHDL: 119.35
Total CHOL/HDL Ratio: 5
Triglycerides: 228 mg/dL — ABNORMAL HIGH (ref 0.0–149.0)
VLDL: 45.6 mg/dL — ABNORMAL HIGH (ref 0.0–40.0)

## 2020-08-16 LAB — COMPREHENSIVE METABOLIC PANEL
ALT: 17 U/L (ref 0–35)
AST: 23 U/L (ref 0–37)
Albumin: 4.5 g/dL (ref 3.5–5.2)
Alkaline Phosphatase: 109 U/L (ref 39–117)
BUN: 23 mg/dL (ref 6–23)
CO2: 24 mEq/L (ref 19–32)
Calcium: 10 mg/dL (ref 8.4–10.5)
Chloride: 98 mEq/L (ref 96–112)
Creatinine, Ser: 1.31 mg/dL — ABNORMAL HIGH (ref 0.40–1.20)
GFR: 43.4 mL/min — ABNORMAL LOW (ref >60.00)
Glucose, Bld: 209 mg/dL — ABNORMAL HIGH (ref 70–99)
Potassium: 3.5 mEq/L (ref 3.5–5.1)
Sodium: 137 mEq/L (ref 135–145)
Total Bilirubin: 0.8 mg/dL (ref 0.2–1.2)
Total Protein: 8.8 g/dL — ABNORMAL HIGH (ref 6.0–8.3)

## 2020-08-16 LAB — LDL CHOLESTEROL, DIRECT: Direct LDL: 89 mg/dL

## 2020-08-16 LAB — MICROALBUMIN / CREATININE URINE RATIO
Creatinine,U: 126.9 mg/dL
Microalb Creat Ratio: 0.7 mg/g (ref 0.0–30.0)
Microalb, Ur: 0.9 mg/dL (ref 0.0–1.9)

## 2020-08-17 LAB — FRUCTOSAMINE: Fructosamine: 341 umol/L — ABNORMAL HIGH (ref 0–285)

## 2020-08-18 ENCOUNTER — Ambulatory Visit: Payer: BC Managed Care – PPO | Admitting: Endocrinology

## 2020-08-18 ENCOUNTER — Other Ambulatory Visit: Payer: Self-pay

## 2020-08-18 ENCOUNTER — Encounter: Payer: Self-pay | Admitting: Endocrinology

## 2020-08-18 VITALS — BP 128/88 | HR 79 | Ht 63.0 in | Wt 208.6 lb

## 2020-08-18 DIAGNOSIS — E782 Mixed hyperlipidemia: Secondary | ICD-10-CM | POA: Diagnosis not present

## 2020-08-18 DIAGNOSIS — I10 Essential (primary) hypertension: Secondary | ICD-10-CM | POA: Diagnosis not present

## 2020-08-18 DIAGNOSIS — E1165 Type 2 diabetes mellitus with hyperglycemia: Secondary | ICD-10-CM | POA: Diagnosis not present

## 2020-08-18 MED ORDER — METFORMIN HCL ER 500 MG PO TB24: 1000.0000 mg | ORAL_TABLET | Freq: Every day | ORAL | 3 refills | Status: DC

## 2020-08-18 MED ORDER — TRESIBA FLEXTOUCH 100 UNIT/ML ~~LOC~~ SOPN: PEN_INJECTOR | SUBCUTANEOUS | 0 refills | Status: DC

## 2020-08-18 NOTE — Progress Notes (Signed)
Patient ID: Monica Clark, female   DOB: 24-Feb-1955, 65 y.o.   MRN: [id number]           Reason for Appointment: Follow-up for Type 2 Diabetes  Referring physician: [name]    History of Present Illness:          Date of diagnosis of type 2 diabetes mellitus: 2010        Recent history:       Non-insulin hypoglycemic drugs: Jardiance 25 mg daily, Ozempic 1 mg weekly metformin ER 500 to 1000 mg daily  SOLIQUA 30-35 units previously  Current management, blood sugar patterns and problems identified:  Her A1c is 11 last month, 6.4 previously  She has not come back for follow-up since 6/21  She has not been taking Soliqua for up to 6 months since she ran out and did not come back for follow-up  She did not bring her meter and mostly checking blood sugars in the mornings Blood sugars are appearing to be averaging over 200 in the mornings at home, highest 259 She is not exercising because of fatigue and foot pain She has been working with the weight loss program  Apparently at the weight loss program she has been switched from Rybelsus to [place] Does not appear to be taking her metformin consistently and because of diarrhea may only take 1 or 2 tablets a day Previously Amaryl was stopped  Despite being on the weight loss management program she has not lost any weight since last year        Side effects from medications have been: Diarrhea from metformin  Compliance with the medical regimen: Variable Hypoglycemia: None    Glucose monitoring:  done 0-1  times a day         Glucometer:  One Touch Ultra  Blood Glucose readings by patient history  HOME  blood sugar range recently 190-259   Self-care: The diet that the patient has been following is: tries to limit carbs.      Typical meal intake: Breakfast is Egg sandwich Lunch is chicken, vegetables, fruit and dinner is starch, meat and fruit.  Snacks: Granola bar and fruit               Dietician visit, most recent:  2013                Weight history:209-229  Wt Readings from Last 3 Encounters:  08/18/20 208 lb 9.6 oz (94.6 kg)  07/20/20 211 lb (95.7 kg)  06/28/20 220 lb (99.8 kg)    Glycemic control:   Lab Results  Component Value Date   HGBA1C 11.0 (H) 06/28/2020   HGBA1C 6.4 (A) 08/05/2019   HGBA1C 7.5 (H) 12/05/2018   Lab Results  Component Value Date   MICROALBUR 0.9 08/16/2020   LDLCALC 120 (H) 06/28/2020   CREATININE 1.31 (H) 08/16/2020     Lab Results  Component Value Date   MICRALBCREAT 0.7 08/16/2020    Background history:   She is not clear when her diabetes for diagnosis but had a high A1c in 2010 She was on various oral medications over the last few years including Januvia  Amaryl was probably started in 2015 in addition to metformin She thinks her control was good only in the first few years, A1c 2016 was 9.5 She was without medication for some time in December 2018 when A1c was 15+  A1c in December 2019 was 15.5 and usually over 10    Allergies  as of 08/18/2020       Reactions   Codeine Other (See Comments)   INSOMNIA        Medication List        Accurate as of August 18, 2020 11:48 AM. If you have any questions, ask your nurse or doctor.          Insulin Pen Needle 31G X 5 MM Misc Use once a day   Jardiance 25 MG Tabs tablet Generic drug: empagliflozin TAKE 1 TABLET BY MOUTH ONCE DAILY. DX:E11.65   OneTouch Ultra test strip Generic drug: glucose blood TEST TWO TIMES A DAY   Ozempic (1 MG/DOSE) 4 MG/3ML Sopn Generic drug: Semaglutide (1 MG/DOSE) INJECT 1 MG INTO THE SKIN ONCE A WEEK.   rosuvastatin 5 MG tablet Commonly known as: CRESTOR TAKE 1 TABLET BY MOUTH EVERY DAY   tamoxifen 20 MG tablet Commonly known as: NOLVADEX Take 1 tablet (20 mg total) by mouth daily.   Tyler Aas FlexTouch 100 UNIT/ML FlexTouch Pen Generic drug: insulin degludec Start with 20 units every morning and adjust as directed.  Replacing Willeen Niece Started by:  Elayne Snare, MD   triamterene-hydrochlorothiazide 37.5-25 MG tablet Commonly known as: MAXZIDE-25 Take 1 tablet by mouth every morning. Reported on 09/03/2015   Vitamin D (Ergocalciferol) 1.25 MG (50000 UNIT) Caps capsule Commonly known as: DRISDOL Take 1 capsule (50,000 Units total) by mouth every 7 (seven) days.        Allergies:  Allergies  Allergen Reactions   Codeine Other (See Comments)    INSOMNIA    Past Medical History:  Diagnosis Date   Breast mass, left    duct mass   Depression    Endometrial hyperplasia    Fuchs' corneal dystrophy    Gallbladder problem    Heart murmur    states no known problems, has never seen a cardiologist   Hypertension    states under control with med., has been on med. x 5 yr.   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[place])    Nipple discharge 04/2014   left breast   Non-insulin dependent type 2 diabetes mellitus [facility])     Past Surgical History:  Procedure Laterality Date   BREAST BIOPSY Left 04/30/2014   Procedure: LEFT BREAST BIOPSY;  Surgeon: Jackolyn Confer, MD;  Location: [place];  Service: General;  Laterality: Left;   BREAST DUCTAL SYSTEM EXCISION Left 04/30/2014   Procedure: LEFT NIPPLE DUCT EXCISION;  Surgeon: Jackolyn Confer, MD;  Location: [place];  Service: General;  Laterality: Left;   [place]   ROBOTIC ASSISTED TOTAL HYSTERECTOMY WITH BILATERAL SALPINGO OOPHERECTOMY Bilateral 10/13/2014   Procedure: ROBOTIC ASSISTED TOTAL HYSTERECTOMY WITH BILATERAL SALPINGO OOPHORECTOMY ;  Surgeon: Everitt Amber, MD;  Location: [facility];  Service: Gynecology;  Laterality: Bilateral;    Family History  Problem Relation Age of Onset   Diabetes Mother    Obesity Mother    Heart disease Father    Cancer Father        throat cancer    Sudden death Father    Alcoholism Father    Diabetes Sister    Diabetes Brother      Social History:  reports that she has never smoked. She has never used smokeless tobacco. She reports current alcohol use. She reports that she does not use drugs.   Review of Systems  Endocrine: Positive  for fatigue.    Lipid history: Has mixed hyperlipidemia  Was started on Crestor in 2021 and LDL is below 100   However she tends to have persistently high triglycerides Previously had been prescribed fenofibrate      Lab Results  Component Value Date   CHOL 150 08/16/2020   CHOL 202 (H) 06/28/2020   CHOL 196 07/28/2019   Lab Results  Component Value Date   HDL 30.70 (L) 08/16/2020   HDL 33 (L) 06/28/2020   HDL 32.90 (L) 07/28/2019   Lab Results  Component Value Date   LDLCALC 120 (H) 06/28/2020   LDLCALC 126 (H) 04/16/2018   LDLCALC (H) 10/24/2008    157        Total Cholesterol/HDL:CHD Risk Coronary Heart Disease Risk Table                     Men   Women  1/2 Average Risk   3.4   3.3  Average Risk       5.0   4.4  2 X Average Risk   9.6   7.1  3 X Average Risk  23.4   11.0        Use the calculated Patient Ratio above and the CHD Risk Table to determine the patient's CHD Risk.        ATP III CLASSIFICATION (LDL):  <100     mg/dL   Optimal  100-129  mg/dL   Near or Above                    Optimal  130-159  mg/dL   Borderline  160-189  mg/dL   High  >190     mg/dL   Very High   Lab Results  Component Value Date   TRIG 228.0 (H) 08/16/2020   TRIG 277 (H) 06/28/2020   TRIG 211.0 (H) 07/28/2019   Lab Results  Component Value Date   CHOLHDL 5 08/16/2020   CHOLHDL 6 07/28/2019   CHOLHDL 6 12/03/2018   Lab Results  Component Value Date   LDLDIRECT 89.0 08/16/2020   LDLDIRECT 121.0 07/28/2019   LDLDIRECT 110.0 12/03/2018    Hypertension: Currently treated with only Maxzide by her PCP Also on Jardiance  Not on ACE inhibitor/ARB  Her creatinine appears to be higher  Lab Results  Component Value Date   CREATININE 1.31 (H) 08/16/2020    CREATININE 0.71 06/28/2020   CREATININE 0.82 01/08/2020    Last microalbumin normal  LABS:  Lab on 08/16/2020  Component Date Value Ref Range Status   Cholesterol 08/16/2020 150  0 - 200 mg/dL Final   ATP III Classification       Desirable:  < 200 mg/dL               Borderline High:  200 - 239 mg/dL          High:  > = 240 mg/dL   Triglycerides 08/16/2020 228.0 (A) 0.0 - 149.0 mg/dL Final   Normal:  <150 mg/dLBorderline High:  150 - 199 mg/dL   HDL 08/16/2020 30.70 (A) >39.00 mg/dL Final   VLDL 08/16/2020 45.6 (A) 0.0 - 40.0 mg/dL Final   Total CHOL/HDL Ratio 08/16/2020 5   Final                  Men          Women1/2 Average Risk     3.4  3.3Average Risk          5.0          4.42X Average Risk          9.6          7.13X Average Risk          15.0          11.0                       NonHDL 08/16/2020 119.35   Final   NOTE:  Non-HDL goal should be 30 mg/dL higher than patient's LDL goal (i.e. LDL goal of < 70 mg/dL, would have non-HDL goal of < 100 mg/dL)   Microalb, Ur 08/16/2020 0.9  0.0 - 1.9 mg/dL Final   Creatinine,U 08/16/2020 126.9  mg/dL Final   Microalb Creat Ratio 08/16/2020 0.7  0.0 - 30.0 mg/g Final   Sodium 08/16/2020 137  135 - 145 mEq/L Final   Potassium 08/16/2020 3.5  3.5 - 5.1 mEq/L Final   Chloride 08/16/2020 98  96 - 112 mEq/L Final   CO2 08/16/2020 24  19 - 32 mEq/L Final   Glucose, Bld 08/16/2020 209 (A) 70 - 99 mg/dL Final   BUN 08/16/2020 23  6 - 23 mg/dL Final   Creatinine, Ser 08/16/2020 1.31 (A) 0.40 - 1.20 mg/dL Final   Total Bilirubin 08/16/2020 0.8  0.2 - 1.2 mg/dL Final   Alkaline Phosphatase 08/16/2020 109  39 - 117 U/L Final   AST 08/16/2020 23  0 - 37 U/L Final   ALT 08/16/2020 17  0 - 35 U/L Final   Total Protein 08/16/2020 8.8 (A) 6.0 - 8.3 g/dL Final   Albumin 08/16/2020 4.5  3.5 - 5.2 g/dL Final   GFR 08/16/2020 43.40 (A) >60.00 mL/min Final   Calculated using the CKD-EPI Creatinine Equation (2021)   Calcium 08/16/2020 10.0  8.4 -  10.5 mg/dL Final   Fructosamine 08/16/2020 341 (A) 0 - 285 umol/L Final   Comment: Published reference interval for apparently healthy subjects between age 85 and 79 is 47 - 285 umol/L and in a poorly controlled diabetic population is 228 - 563 umol/L with a mean of 396 umol/L.    Direct LDL 08/16/2020 89.0  mg/dL Final   Optimal:  <100 mg/dLNear or Above Optimal:  100-129 mg/dLBorderline High:  130-159 mg/dLHigh:  160-189 mg/dLVery High:  >190 mg/dL    Physical Examination:  BP 128/88   Pulse 79   Ht 5\' 3"  (1.6 m)   Wt 208 lb 9.6 oz (94.6 kg)   SpO2 96%   BMI 36.95 kg/m          ASSESSMENT:  Diabetes type 2, with obesity  See history of present illness for detailed discussion of current diabetes management, blood sugar patterns and problems identified  Her A1c is 11 last month  She is coming back after a year for follow-up and has been irregular in the past also Previously on multidrug therapy with Soliqua, Rybelsus, Metformin and Jardiance along with Amaryl  However now she is only on Jardiance, Ozempic 1 mg and 500 or 1000 mg of metformin  Despite being on the weight loss management program she has not lost weight However not exercising for various reasons and is complaining of fatigue  Her blood sugars appear to be in the 200+ range although from the A1c they may have been higher before She does have some insulin deficiency and also may  have more postprandial hyperglycemia which she does not monitor consistently   LIPIDS: Has improved LDL with Crestor but triglycerides are higher, possibly from poor diabetes control  Hypertension: Blood pressure is high normal  Increased creatinine: This is not explained although she did take ibuprofen a month ago  PLAN:   She will need to restart a basal insulin We will start with TRESIBA 20 units and adjust every 3 days based on fasting blood sugar, discussed how to titrate this Discussed that since she is on Ozempic she  will not need to be on Soliqua and explained the differences between [place] and basal insulin as well as the fact that [place] already has a GLP-1 drug Metformin can be continued as tolerated, 1-2 tablets a day Will also consider increasing Ozempic to 2 mg She needs to start checking blood sugars after meals more often and bring meter for download on each visit Discussed blood sugar ideal range for both fasting and after meals  More regular follow-up She will start walking on her treadmill when she has resolved her musculoskeletal problems Recommended that she a podiatrist for her foot pain  Continue ROSUVASTATIN 5 mg daily May consider fenofibrate for triglycerides if needed  Since she has a higher creatinine she will reduce her Jardiance to half a tablet for now and recheck on the next visit    Elayne Snare 08/18/2020, 11:48 AM   Note: This office note was prepared with Dragon voice recognition system technology. Any transcriptional errors that result from this process are unintentional.

## 2020-08-18 NOTE — Patient Instructions (Addendum)
Soliqua, 20 units for 3 days, go up 4 units each 3 days till am sugar < 130  Check blood sugars on waking up 4-5 days a week  Also check blood sugars about 2 hours after meals and do this after different meals by rotation  Recommended blood sugar levels on waking up are 90-130 and about 2 hours after meal is 130-160  Please bring your blood sugar monitor to each visit, thank you  Jardiance 1/2 daily

## 2020-08-19 ENCOUNTER — Other Ambulatory Visit: Payer: Self-pay

## 2020-08-19 MED ORDER — TRESIBA FLEXTOUCH 100 UNIT/ML ~~LOC~~ SOPN: PEN_INJECTOR | SUBCUTANEOUS | 0 refills | Status: DC

## 2020-08-20 ENCOUNTER — Other Ambulatory Visit (INDEPENDENT_AMBULATORY_CARE_PROVIDER_SITE_OTHER): Payer: Self-pay | Admitting: Family Medicine

## 2020-08-20 DIAGNOSIS — E1165 Type 2 diabetes mellitus with hyperglycemia: Secondary | ICD-10-CM

## 2020-08-24 MED ORDER — OZEMPIC (1 MG/DOSE) 4 MG/3ML ~~LOC~~ SOPN: 1.0000 mg | PEN_INJECTOR | SUBCUTANEOUS | 0 refills | Status: DC

## 2020-08-24 NOTE — Telephone Encounter (Signed)
Pt last seen by Dr. Beasley.  

## 2020-08-24 NOTE — Telephone Encounter (Signed)
Ok to refill?   Last OV 5/31 to be seen in 4 weeks, Last filled 5/25, Next OV 08/31/20

## 2020-08-31 ENCOUNTER — Encounter (INDEPENDENT_AMBULATORY_CARE_PROVIDER_SITE_OTHER): Payer: Self-pay | Admitting: Family Medicine

## 2020-08-31 ENCOUNTER — Other Ambulatory Visit: Payer: Self-pay

## 2020-08-31 ENCOUNTER — Ambulatory Visit (INDEPENDENT_AMBULATORY_CARE_PROVIDER_SITE_OTHER): Payer: BC Managed Care – PPO | Admitting: Family Medicine

## 2020-08-31 VITALS — BP 136/75 | HR 65 | Temp 98.0°F | Ht 63.0 in | Wt 212.0 lb

## 2020-08-31 DIAGNOSIS — N189 Chronic kidney disease, unspecified: Secondary | ICD-10-CM | POA: Diagnosis not present

## 2020-08-31 DIAGNOSIS — E1169 Type 2 diabetes mellitus with other specified complication: Secondary | ICD-10-CM

## 2020-08-31 DIAGNOSIS — Z6838 Body mass index (BMI) 38.0-38.9, adult: Secondary | ICD-10-CM

## 2020-08-31 DIAGNOSIS — E559 Vitamin D deficiency, unspecified: Secondary | ICD-10-CM

## 2020-08-31 DIAGNOSIS — Z9189 Other specified personal risk factors, not elsewhere classified: Secondary | ICD-10-CM | POA: Diagnosis not present

## 2020-09-07 NOTE — Progress Notes (Signed)
Chief Complaint:   OBESITY Monica Clark is here to discuss her progress with her obesity treatment plan along with follow-up of her obesity related diagnoses. Monica Clark is on following a lower carbohydrate, vegetable and lean protein rich diet plan and states she is following her eating plan approximately 75% of the time. Monica Clark states she is doing 0 minutes 0 times per week.  Today's visit was #: 24 Starting weight: 216 lbs Starting date: 04/16/2018 Today's weight: 212 lbs Today's date: 08/31/2020 Total lbs lost to date: 4 Total lbs lost since last in-office visit: 0  Interim History: Monica Clark did some celebration eating over the holidays. She is ready to get back on track with her eating plan. She is open to trying a lower carbohydrate plan, but being much more strict.  Subjective:   1. Type 2 diabetes mellitus with other specified complication, without long-term current use of insulin (HCC) Monica Clark has re-established care with her Endocrinologist Dr. Dwyane Dee, who has adjusted her medications and will hopefully help control her glucose levels. She has struggled to follow a lower carbohydrate eating plan, but she is still working on improving her diet.  2. Chronic renal impairment, unspecified CKD stage Monica Clark's last BUN and creatinine was elevated. This is likely related to her worsening glucose levels. She is ready to get her glucose under better control.  3. At risk for dehydration Monica Clark is at risk for dehydration due to inadequate water intake.  Assessment/Plan:   1. Type 2 diabetes mellitus with other specified complication, without long-term current use of insulin (HCC) Monica Clark will continue with weight loss efforts and will follow up with Dr. Dwyane Dee as instructed. Good blood sugar control is important to decrease the likelihood of diabetic complications such as nephropathy, neuropathy, limb loss, blindness, coronary artery disease, and death. Intensive lifestyle modification including diet,  exercise and weight loss are the first line of treatment for diabetes.   2. Chronic renal impairment, unspecified CKD stage Monica Clark will continue diet and exercise, and she will decrease sodium and increase her water intake. Will continue to monitor.  3. At risk for dehydration Monica Clark was given approximately 15 minutes dehydration prevention counseling today. Monica Clark is at risk for dehydration due to weight loss and current medication(s). She was encouraged to hydrate and monitor fluid status to avoid dehydration as well as weight loss plateaus.   4. Obesity with current BMI 37.7 Monica Clark is currently in the action stage of change. As such, her goal is to continue with weight loss efforts. She has agreed to change to following a lower carbohydrate, vegetable and lean protein rich diet plan.   Behavioral modification strategies: increasing lean protein intake and increasing water intake.  Monica Clark has agreed to follow-up with our clinic in 3 weeks. She was informed of the importance of frequent follow-up visits to maximize her success with intensive lifestyle modifications for her multiple health conditions.   Objective:   Blood pressure 136/75, pulse 65, temperature 98 F (36.7 C), height 5\' 3"  (1.6 m), weight 212 lb (96.2 kg), SpO2 96 %. Body mass index is 37.55 kg/m.  General: Cooperative, alert, well developed, in no acute distress. HEENT: Conjunctivae and lids unremarkable. Cardiovascular: Regular rhythm.  Lungs: Normal work of breathing. Neurologic: No focal deficits.   Lab Results  Component Value Date   CREATININE 1.31 (H) 08/16/2020   BUN 23 08/16/2020   NA 137 08/16/2020   K 3.5 08/16/2020   CL 98 08/16/2020   CO2 24 08/16/2020  Lab Results  Component Value Date   ALT 17 08/16/2020   AST 23 08/16/2020   ALKPHOS 109 08/16/2020   BILITOT 0.8 08/16/2020   Lab Results  Component Value Date   HGBA1C 11.0 (H) 06/28/2020   HGBA1C 6.4 (A) 08/05/2019   HGBA1C 7.5 (H) 12/05/2018    HGBA1C 7.8 (H) 09/13/2018   HGBA1C 9.0 (H) 04/16/2018   Lab Results  Component Value Date   INSULIN 27.9 (H) 06/28/2020   INSULIN 8.7 04/16/2018   Lab Results  Component Value Date   TSH 2.220 04/16/2018   Lab Results  Component Value Date   CHOL 150 08/16/2020   HDL 30.70 (L) 08/16/2020   LDLCALC 120 (H) 06/28/2020   LDLDIRECT 89.0 08/16/2020   TRIG 228.0 (H) 08/16/2020   CHOLHDL 5 08/16/2020   Lab Results  Component Value Date   VD25OH 12.6 (L) 06/28/2020   VD25OH 30.7 12/05/2018   VD25OH 8.3 (L) 04/16/2018   Lab Results  Component Value Date   WBC 9.1 01/08/2020   HGB 13.5 01/08/2020   HCT 41.5 01/08/2020   MCV 92.2 01/08/2020   PLT 373 01/08/2020   No results found for: IRON, TIBC, FERRITIN  Attestation Statements:   Reviewed by clinician on day of visit: allergies, medications, problem list, medical history, surgical history, family history, social history, and previous encounter notes.   I, Trixie Dredge, am acting as transcriptionist for Dennard Nip, MD.  I have reviewed the above documentation for accuracy and completeness, and I agree with the above. -  Dennard Nip, MD

## 2020-09-12 ENCOUNTER — Ambulatory Visit (HOSPITAL_COMMUNITY)
Admission: EM | Admit: 2020-09-12 | Discharge: 2020-09-12 | Disposition: A | Discharge status: Home / Self Care | Payer: BC Managed Care – PPO | Attending: Emergency Medicine | Admitting: Emergency Medicine

## 2020-09-12 ENCOUNTER — Encounter (HOSPITAL_COMMUNITY): Payer: Self-pay | Admitting: *Deleted

## 2020-09-12 ENCOUNTER — Other Ambulatory Visit: Payer: Self-pay

## 2020-09-12 DIAGNOSIS — L03012 Cellulitis of left finger: Secondary | ICD-10-CM

## 2020-09-12 MED ORDER — MUPIROCIN CALCIUM 2 % EX CREA: 1.0000 "application " | TOPICAL_CREAM | Freq: Three times a day (TID) | CUTANEOUS | 0 refills | Status: AC

## 2020-09-12 MED ORDER — AMOXICILLIN-POT CLAVULANATE 875-125 MG PO TABS
1.0000 | ORAL_TABLET | Freq: Two times a day (BID) | ORAL | 0 refills | Status: DC
Start: 2020-09-12 — End: 2020-10-18

## 2020-09-12 NOTE — Discharge Instructions (Addendum)
  Please take antibiotics as prescribed and be sure to complete entire course even if you start to feel better to ensure infection does not come back.  Follow up with primary care within 2 days if not improving, or return to urgent care if needed, especially if not improving.

## 2020-09-12 NOTE — ED Triage Notes (Signed)
T has swelling and pain on Lt index finger.

## 2020-09-12 NOTE — ED Provider Notes (Signed)
[place]    CSN: [id number] Arrival date & time: 09/12/20  1547      History   Chief Complaint Chief Complaint  Patient presents with   finger infection    HPI Monica Clark is a 65 y.o. female.   HPI Monica Clark is a 65 y.o. female presenting to UC with c/o 2 days of gradually worsening pain, swelling and redness of Left index finger around the nail.  Pain is 10/10 when palpated.  No drainage or bleeding from skin. No recent manicure but notes she is scheduled for an appointment on Friday, 7/29.  Denies fever or chills. Pt is a diabetic, CBG has been a little higher for her recently.  Hx of paronychia about 4 years ago that required lancing.   Past Medical History:  Diagnosis Date   Breast mass, left    duct mass   Depression    Endometrial hyperplasia    Fuchs' corneal dystrophy    Gallbladder problem    Heart murmur    states no known problems, has never seen a cardiologist   Hypertension    states under control with med., has been on med. x 5 yr.   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[place])    Nipple discharge 04/2014   left breast   Non-insulin dependent type 2 diabetes mellitus [facility])     Patient Active Problem List   Diagnosis Date Noted   Hypertension associated with type 2 diabetes mellitus ([place]) 06/09/2020   Class 2 severe obesity with serious comorbidity and body mass index (BMI) of 38.0 to 38.9 in adult ([place]) 06/09/2020   Achilles tendonitis, bilateral 09/16/2019   Type 2 diabetes mellitus with hyperglycemia, without long-term current use of insulin ([place]) 03/28/2017   Transaminitis 01/22/2015   Endometrial cancer, grade I ([place]) 11/09/2014   Atypical ductal hyperplasia of breast 08/09/2014   Nipple discharge 03/29/2011    Past Surgical History:  Procedure Laterality Date   BREAST BIOPSY Left 04/30/2014   Procedure: LEFT BREAST BIOPSY;  Surgeon: Jackolyn Confer, MD;  Location: [place];  Service: General;  Laterality: Left;   BREAST DUCTAL SYSTEM EXCISION Left 04/30/2014   Procedure: LEFT NIPPLE DUCT EXCISION;  Surgeon: Jackolyn Confer, MD;  Location: [place];  Service: General;  Laterality: Left;   [place]   ROBOTIC ASSISTED TOTAL HYSTERECTOMY WITH BILATERAL SALPINGO OOPHERECTOMY Bilateral 10/13/2014   Procedure: ROBOTIC ASSISTED TOTAL HYSTERECTOMY WITH BILATERAL SALPINGO OOPHORECTOMY ;  Surgeon: Everitt Amber, MD;  Location: [facility];  Service: Gynecology;  Laterality: Bilateral;    OB History     Gravida  3   Para      Term      Preterm      AB      Living  3      SAB      IAB      Ectopic      Multiple      Live Births               Home Medications    Prior to Admission medications   Medication Sig Start Date End Date Taking? Authorizing Provider  amoxicillin-clavulanate (AUGMENTIN) 875-125 MG tablet Take 1 tablet by mouth every 12 (twelve) hours. 09/12/20  Yes Jamilet Ambroise, Bronwen Betters, PA-C  mupirocin cream (BACTROBAN) 2 % Apply 1 application topically 3 (three) times  daily for 5 days. 09/12/20 09/17/20 Yes Latecia Miler O, PA-C  insulin degludec (TRESIBA FLEXTOUCH) 100 UNIT/ML FlexTouch Pen Start with 20 units every morning and adjust as directed. Max 40 units daily  Replacing Willeen Niece 08/19/20   Elayne Snare, MD  Insulin Pen Needle 31G X 5 MM MISC Use once a day 06/20/17   Elayne Snare, MD  JARDIANCE 25 MG TABS tablet TAKE 1 TABLET BY MOUTH ONCE DAILY. DX:E11.65 04/17/19   Elayne Snare, MD  metFORMIN (GLUCOPHAGE-XR) 500 MG 24 hr tablet Take 2 tablets (1,000 mg total) by mouth daily with supper. 08/18/20   Elayne Snare, MD  [facility] ULTRA test strip TEST TWO TIMES A DAY 05/27/19   Dennard Nip D, MD  rosuvastatin (CRESTOR) 5 MG tablet TAKE 1 TABLET BY MOUTH EVERY DAY 10/01/19   Elayne Snare, MD  Semaglutide, 1 MG/DOSE, (OZEMPIC, 1 MG/DOSE,) 4 MG/3ML SOPN Inject 1 mg into the skin once a week. 08/24/20    Dennard Nip D, MD  triamterene-hydrochlorothiazide (MAXZIDE-25) 37.5-25 MG per tablet Take 1 tablet by mouth every morning. Reported on 09/03/2015    [provider]  Vitamin D, Ergocalciferol, (DRISDOL) 1.25 MG (50000 UNIT) CAPS capsule Take 1 capsule (50,000 Units total) by mouth every 7 (seven) days. 07/20/20   Starlyn Skeans, MD    Family History Family History  Problem Relation Age of Onset   Diabetes Mother    Obesity Mother    Heart disease Father    Cancer Father        throat cancer    Sudden death Father    Alcoholism Father    Diabetes Sister    Diabetes Brother     Social History Social History   Tobacco Use   Smoking status: Never   Smokeless tobacco: Never  Substance Use Topics   Alcohol use: Yes    Comment: rarely   Drug use: No     Allergies   Codeine   Review of Systems Review of Systems  Musculoskeletal:  Negative for arthralgias.  All other systems reviewed and are negative.   Physical Exam Triage Vital Signs ED Triage Vitals  Enc Vitals Group     BP 09/12/20 1621 125/81     Pulse Rate 09/12/20 1621 90     Resp --      Temp 09/12/20 1621 98.8 F (37.1 C)     Temp src --      SpO2 09/12/20 1621 99 %     Weight --      Height --      Head Circumference --      Peak Flow --      Pain Score 09/12/20 1623 10     Pain Loc --      Pain Edu? --      Excl. in [place]? --    No data found.  Updated Vital Signs BP 125/81   Pulse 90   Temp 98.8 F (37.1 C)   SpO2 99%   Visual Acuity Right Eye Distance:   Left Eye Distance:   Bilateral Distance:    Right Eye Near:   Left Eye Near:    Bilateral Near:     Physical Exam Vitals and nursing note reviewed.  Constitutional:      Appearance: Normal appearance. She is well-developed.  HENT:     Head: Normocephalic and atraumatic.  Cardiovascular:     Rate and Rhythm: Normal rate.  Pulmonary:     Effort: Pulmonary effort is  normal.  Musculoskeletal:        General:  Swelling and tenderness present. Normal range of motion.     Cervical back: Normal range of motion.     Comments: Left index finger: full ROM, swelling and tenderness around nailbed (see skin exam)  Skin:    General: Skin is warm and dry.     Capillary Refill: Capillary refill takes less than 2 seconds.     Findings: Abscess (Left index finger around nailbed, skin in tact.) present.  Neurological:     Mental Status: She is alert and oriented to person, place, and time.  Psychiatric:        Behavior: Behavior normal.     UC Treatments / Results  Labs (all labs ordered are listed, but only abnormal results are displayed) Labs Reviewed - No data to display  EKG   Radiology No results found.  Procedures Incision and Drainage  Date/Time: 09/12/2020 5:23 PM Performed by: Noe Gens, PA-C Authorized by: Noe Gens, PA-C   Consent:    Consent obtained:  Verbal   Consent given by:  Patient   Risks, benefits, and alternatives were discussed: yes     Risks discussed:  Bleeding, incomplete drainage, pain and infection   Alternatives discussed:  No treatment and delayed treatment Universal protocol:    Procedure explained and questions answered to patient or proxy's satisfaction: yes     Patient identity confirmed:  Verbally with patient Location:    Type:  Abscess   Size:  1cm   Location:  Upper extremity   Upper extremity location:  Finger   Finger location:  L index finger Pre-procedure details:    Skin preparation:  Antiseptic wash Anesthesia:    Anesthesia method:  Topical application   Topical anesthesia: pain-ease spray. Procedure type:    Complexity:  Simple Procedure details:    Ultrasound guidance: no     Needle aspiration: no     Incision types:  Stab incision   Incision depth:  Subcutaneous   Drainage:  Purulent and bloody   Drainage amount:  Scant   Wound treatment:  Wound left open   Packing materials:  None Post-procedure details:    Procedure  completion:  Tolerated well, no immediate complications (including critical care time)  Medications Ordered in UC Medications - No data to display  Initial Impression / Assessment and Plan / UC Course  I have reviewed the triage vital signs and the nursing notes.  Pertinent labs & imaging results that were available during my care of the patient were reviewed by me and considered in my medical decision making (see chart for details).     Paronychia of Left index finger I&D as noted above Pt is diabetic, will tx with oral and topical antibiotics Encouraged close f/u with PCP or UC if needed AVS given  Final Clinical Impressions(s) / UC Diagnoses   Final diagnoses:  Paronychia of left index finger     Discharge Instructions       Please take antibiotics as prescribed and be sure to complete entire course even if you start to feel better to ensure infection does not come back.  Follow up with primary care within 2 days if not improving, or return to urgent care if needed, especially if not improving.      ED Prescriptions     Medication Sig Dispense Auth. Provider   amoxicillin-clavulanate (AUGMENTIN) 875-125 MG tablet Take 1 tablet by mouth every 12 (twelve) hours. [place]  tablet Noe Gens, PA-C   mupirocin cream (BACTROBAN) 2 % Apply 1 application topically 3 (three) times daily for 5 days. 15 g Noe Gens, PA-C      PDMP not reviewed this encounter.   Noe Gens, [place] 09/12/20 1725

## 2020-09-22 ENCOUNTER — Ambulatory Visit (INDEPENDENT_AMBULATORY_CARE_PROVIDER_SITE_OTHER): Payer: BC Managed Care – PPO | Admitting: Family Medicine

## 2020-09-27 ENCOUNTER — Other Ambulatory Visit: Payer: BC Managed Care – PPO

## 2020-09-29 ENCOUNTER — Encounter: Payer: BC Managed Care – PPO | Admitting: Endocrinology

## 2020-09-29 NOTE — Progress Notes (Signed)
This encounter was created in error - please disregard.

## 2020-10-08 ENCOUNTER — Other Ambulatory Visit: Payer: Self-pay | Admitting: Endocrinology

## 2020-10-18 ENCOUNTER — Other Ambulatory Visit: Payer: Self-pay

## 2020-10-18 ENCOUNTER — Encounter (INDEPENDENT_AMBULATORY_CARE_PROVIDER_SITE_OTHER): Payer: Self-pay | Admitting: Family Medicine

## 2020-10-18 ENCOUNTER — Ambulatory Visit (INDEPENDENT_AMBULATORY_CARE_PROVIDER_SITE_OTHER): Payer: BC Managed Care – PPO | Admitting: Family Medicine

## 2020-10-18 VITALS — BP 118/73 | HR 73 | Temp 98.3°F | Ht 63.0 in | Wt 211.0 lb

## 2020-10-18 DIAGNOSIS — E559 Vitamin D deficiency, unspecified: Secondary | ICD-10-CM

## 2020-10-18 DIAGNOSIS — E1165 Type 2 diabetes mellitus with hyperglycemia: Secondary | ICD-10-CM | POA: Diagnosis not present

## 2020-10-18 DIAGNOSIS — Z9189 Other specified personal risk factors, not elsewhere classified: Secondary | ICD-10-CM | POA: Diagnosis not present

## 2020-10-18 DIAGNOSIS — Z6838 Body mass index (BMI) 38.0-38.9, adult: Secondary | ICD-10-CM

## 2020-10-18 MED ORDER — OZEMPIC (1 MG/DOSE) 4 MG/3ML ~~LOC~~ SOPN: 1.0000 mg | PEN_INJECTOR | SUBCUTANEOUS | 0 refills | Status: DC

## 2020-10-18 MED ORDER — VITAMIN D (ERGOCALCIFEROL) 1.25 MG (50000 UNIT) PO CAPS: 50000.0000 [IU] | ORAL_CAPSULE | ORAL | 0 refills | Status: DC

## 2020-10-19 LAB — HM COLONOSCOPY

## 2020-10-19 NOTE — Progress Notes (Signed)
Chief Complaint:   OBESITY Monica Clark is here to discuss her progress with her obesity treatment plan along with follow-up of her obesity related diagnoses. Monica Clark is on following a lower carbohydrate, vegetable and lean protein rich diet plan and states she is following her eating plan approximately 75-80% of the time. Monica Clark states she is doing 0 minutes 0 times per week.  Today's visit was #: 25 Starting weight: 216 lbs Starting date: 04/16/2018 Today's weight: 211 lbs Today's date: 10/18/2020 Total lbs lost to date: 5 Total lbs lost since last in-office visit: 1  Interim History: Monica Clark continues to work on diet and exercise but meal planning has been especially difficult this last month. She has still been able to lose weight even with extra challenges.  Subjective:   1. Type 2 diabetes mellitus with hyperglycemia, without long-term current use of insulin (HCC) Monica Clark was out of town for her mother in-law's funeral and she had to miss her follow up appointment with Dr. Dwyane Dee.  2. Vitamin D deficiency Monica Clark is stable on Vit D, and she denies nausea, vomiting, or muscle weakness.  3. At risk for heart disease Monica Clark is at a higher than average risk for cardiovascular disease due to obesity.   Assessment/Plan:   1. Type 2 diabetes mellitus with hyperglycemia, without long-term current use of insulin (HCC) Monica Clark is to reschedule her appointment with Dr. Dwyane Dee as soon as possible. She will continue with diet and exercise, and we will refill Ozempic for 1 month. Good blood sugar control is important to decrease the likelihood of diabetic complications such as nephropathy, neuropathy, limb loss, blindness, coronary artery disease, and death. Intensive lifestyle modification including diet, exercise and weight loss are the first line of treatment for diabetes.   - Semaglutide, 1 MG/DOSE, (OZEMPIC, 1 MG/DOSE,) 4 MG/3ML SOPN; Inject 1 mg into the skin once a week.  Dispense: 1.5 mL; Refill:  0  2. Vitamin D deficiency Low Vitamin D level contributes to fatigue and are associated with obesity, breast, and colon cancer. Monica Clark will continue prescription Vitamin D 50,000 IU every week and we will refill for 1 month. She will follow-up for routine testing of Vitamin D, at least 2-3 times per year to avoid over-replacement.  - Vitamin D, Ergocalciferol, (DRISDOL) 1.25 MG (50000 UNIT) CAPS capsule; Take 1 capsule (50,000 Units total) by mouth every 7 (seven) days.  Dispense: 4 capsule; Refill: 0  3. At risk for heart disease Monica Clark was given approximately 15 minutes of coronary artery disease prevention counseling today. She is 65 y.o. female and has risk factors for heart disease including obesity. We discussed intensive lifestyle modifications today with an emphasis on specific weight loss instructions and strategies.   Repetitive spaced learning was employed today to elicit superior memory formation and behavioral change.  4. Obesity with current BMI 37.4 Monica Clark is currently in the action stage of change. As such, her goal is to continue with weight loss efforts. She has agreed to change to the Category 2 Plan.   Behavioral modification strategies: increasing lean protein intake.  Monica Clark has agreed to follow-up with our clinic in 3 to 4 weeks. She was informed of the importance of frequent follow-up visits to maximize her success with intensive lifestyle modifications for her multiple health conditions.   Objective:   Blood pressure 118/73, pulse 73, temperature 98.3 F (36.8 C), height '5\' 3"'$  (1.6 m), weight 211 lb (95.7 kg), SpO2 97 %. Body mass index is 37.38 kg/m.  General: Cooperative, alert, well developed, in no acute distress. HEENT: Conjunctivae and lids unremarkable. Cardiovascular: Regular rhythm.  Lungs: Normal work of breathing. Neurologic: No focal deficits.   Lab Results  Component Value Date   CREATININE 1.31 (H) 08/16/2020   BUN 23 08/16/2020   NA 137  08/16/2020   K 3.5 08/16/2020   CL 98 08/16/2020   CO2 24 08/16/2020   Lab Results  Component Value Date   ALT 17 08/16/2020   AST 23 08/16/2020   ALKPHOS 109 08/16/2020   BILITOT 0.8 08/16/2020   Lab Results  Component Value Date   HGBA1C 11.0 (H) 06/28/2020   HGBA1C 6.4 (A) 08/05/2019   HGBA1C 7.5 (H) 12/05/2018   HGBA1C 7.8 (H) 09/13/2018   HGBA1C 9.0 (H) 04/16/2018   Lab Results  Component Value Date   INSULIN 27.9 (H) 06/28/2020   INSULIN 8.7 04/16/2018   Lab Results  Component Value Date   TSH 2.220 04/16/2018   Lab Results  Component Value Date   CHOL 150 08/16/2020   HDL 30.70 (L) 08/16/2020   LDLCALC 120 (H) 06/28/2020   LDLDIRECT 89.0 08/16/2020   TRIG 228.0 (H) 08/16/2020   CHOLHDL 5 08/16/2020   Lab Results  Component Value Date   VD25OH 12.6 (L) 06/28/2020   VD25OH 30.7 12/05/2018   VD25OH 8.3 (L) 04/16/2018   Lab Results  Component Value Date   WBC 9.1 01/08/2020   HGB 13.5 01/08/2020   HCT 41.5 01/08/2020   MCV 92.2 01/08/2020   PLT 373 01/08/2020   No results found for: IRON, TIBC, FERRITIN  Attestation Statements:   Reviewed by clinician on day of visit: allergies, medications, problem list, medical history, surgical history, family history, social history, and previous encounter notes.   I, Trixie Dredge, am acting as transcriptionist for Dennard Nip, MD.  I have reviewed the above documentation for accuracy and completeness, and I agree with the above. -  Dennard Nip, MD

## 2020-10-23 ENCOUNTER — Other Ambulatory Visit: Payer: Self-pay | Admitting: Endocrinology

## 2020-10-23 ENCOUNTER — Other Ambulatory Visit (INDEPENDENT_AMBULATORY_CARE_PROVIDER_SITE_OTHER): Payer: Self-pay | Admitting: Family Medicine

## 2020-10-23 DIAGNOSIS — E1165 Type 2 diabetes mellitus with hyperglycemia: Secondary | ICD-10-CM

## 2020-10-26 NOTE — Telephone Encounter (Signed)
Dr.Beasley 

## 2020-10-26 NOTE — Telephone Encounter (Signed)
Will refill at next office visit

## 2020-11-16 ENCOUNTER — Other Ambulatory Visit: Payer: Self-pay

## 2020-11-16 ENCOUNTER — Ambulatory Visit (INDEPENDENT_AMBULATORY_CARE_PROVIDER_SITE_OTHER): Payer: BC Managed Care – PPO | Admitting: Family Medicine

## 2020-11-16 ENCOUNTER — Encounter (INDEPENDENT_AMBULATORY_CARE_PROVIDER_SITE_OTHER): Payer: Self-pay | Admitting: Family Medicine

## 2020-11-16 VITALS — BP 125/76 | HR 74 | Temp 97.9°F | Ht 63.0 in | Wt 210.0 lb

## 2020-11-16 DIAGNOSIS — I10 Essential (primary) hypertension: Secondary | ICD-10-CM

## 2020-11-16 DIAGNOSIS — E559 Vitamin D deficiency, unspecified: Secondary | ICD-10-CM

## 2020-11-16 DIAGNOSIS — Z9189 Other specified personal risk factors, not elsewhere classified: Secondary | ICD-10-CM | POA: Diagnosis not present

## 2020-11-16 DIAGNOSIS — E1165 Type 2 diabetes mellitus with hyperglycemia: Secondary | ICD-10-CM | POA: Diagnosis not present

## 2020-11-16 DIAGNOSIS — Z6837 Body mass index (BMI) 37.0-37.9, adult: Secondary | ICD-10-CM

## 2020-11-16 MED ORDER — TRIAMTERENE-HCTZ 37.5-25 MG PO TABS: 1.0000 | ORAL_TABLET | Freq: Every morning | ORAL | 0 refills | Status: DC

## 2020-11-16 MED ORDER — EMPAGLIFLOZIN 25 MG PO TABS: ORAL_TABLET | ORAL | 0 refills | Status: DC

## 2020-11-16 MED ORDER — VITAMIN D (ERGOCALCIFEROL) 1.25 MG (50000 UNIT) PO CAPS: 50000.0000 [IU] | ORAL_CAPSULE | ORAL | 0 refills | Status: DC

## 2020-11-16 MED ORDER — OZEMPIC (1 MG/DOSE) 4 MG/3ML ~~LOC~~ SOPN: 1.0000 mg | PEN_INJECTOR | SUBCUTANEOUS | 0 refills | Status: DC

## 2020-11-17 ENCOUNTER — Other Ambulatory Visit (INDEPENDENT_AMBULATORY_CARE_PROVIDER_SITE_OTHER): Payer: Self-pay | Admitting: Family Medicine

## 2020-11-17 DIAGNOSIS — E1165 Type 2 diabetes mellitus with hyperglycemia: Secondary | ICD-10-CM

## 2020-11-17 NOTE — Progress Notes (Signed)
Chief Complaint:   OBESITY Monica Clark is here to discuss her progress with her obesity treatment plan along with follow-up of her obesity related diagnoses. Monica Clark is on the Category 2 Plan and states she is following her eating plan approximately 70% of the time. Monica Clark states she is doing 0 minutes 0 times per week.  Today's visit was #: 54 Starting weight: 216 lbs Starting date: 04/16/2018 Today's weight: 210 lbs Today's date: 11/16/2020 Total lbs lost to date: 6 Total lbs lost since last in-office visit: 1  Interim History: Monica Clark continues to do well with weight loss even while on vacation. She is tolerating her Lower carbohydrate plan reasonably well.  Subjective:   1. Type 2 diabetes mellitus with hyperglycemia, without long-term current use of insulin (HCC) Monica Clark has been out of her Ozempic for 4 weeks. She is followed by Dr. Dwyane Dee but we prescribed her Ozempic. She requests a refill of her Vania Rea as she is almost out.  2. Vitamin D deficiency Monica Clark is stable on Vit D, and she denies nausea or vomiting.  3. Essential hypertension Monica Clark's blood pressure is stable on her medications. Her primary care physician just left and she needs refills today.  4. At risk for heart disease Monica Clark is at a higher than average risk for cardiovascular disease due to obesity.   Assessment/Plan:   1. Type 2 diabetes mellitus with hyperglycemia, without long-term current use of insulin (HCC) We will refill Ozempic and Jardiance for 1 month. Monica Clark is follow up with Dr. Dwyane Dee as he has instructed. Good blood sugar control is important to decrease the likelihood of diabetic complications such as nephropathy, neuropathy, limb loss, blindness, coronary artery disease, and death. Intensive lifestyle modification including diet, exercise and weight loss are the first line of treatment for diabetes.   - Semaglutide, 1 MG/DOSE, (OZEMPIC, 1 MG/DOSE,) 4 MG/3ML SOPN; Inject 1 mg into the skin once a week.   Dispense: 1.5 mL; Refill: 0 - empagliflozin (JARDIANCE) 25 MG TABS tablet; TAKE 1 TABLET BY MOUTH ONCE DAILY. DX:E11.65  Dispense: 30 tablet; Refill: 0  2. Vitamin D deficiency Low Vitamin D level contributes to fatigue and are associated with obesity, breast, and colon cancer. We will refill prescription Vitamin D for 1 month. Monica Clark will follow-up for routine testing of Vitamin D, at least 2-3 times per year to avoid over-replacement.  - Vitamin D, Ergocalciferol, (DRISDOL) 1.25 MG (50000 UNIT) CAPS capsule; Take 1 capsule (50,000 Units total) by mouth every 7 (seven) days.  Dispense: 4 capsule; Refill: 0  3. Essential hypertension We will refill Maxzide for 37.5-25 mg for 1 month. Monica Clark was given a list of new primary care physicians and aps. She will continue working on healthy weight loss and exercise to improve blood pressure control. She will watch for signs of hypotension as she continues her lifestyle modifications.  - triamterene-hydrochlorothiazide (MAXZIDE-25) 37.5-25 MG tablet; Take 1 tablet by mouth every morning. Reported on 09/03/2015  Dispense: 30 tablet; Refill: 0  4. At risk for heart disease Monica Clark was given approximately 15 minutes of coronary artery disease prevention counseling today. She is 65 y.o. female and has risk factors for heart disease including obesity. We discussed intensive lifestyle modifications today with an emphasis on specific weight loss instructions and strategies.   Repetitive spaced learning was employed today to elicit superior memory formation and behavioral change.  5. Obesity with current BMI of 37.2 Monica Clark is currently in the action stage of change. As such, her  goal is to continue with weight loss efforts. She has agreed to following a lower carbohydrate, vegetable and lean protein rich diet plan.   Exercise goals: All adults should avoid inactivity. Some physical activity is better than none, and adults who participate in any amount of physical  activity gain some health benefits.  Behavioral modification strategies: increasing lean protein intake.  Monica Clark has agreed to follow-up with our clinic in 2 to 3 weeks. She was informed of the importance of frequent follow-up visits to maximize her success with intensive lifestyle modifications for her multiple health conditions.   Objective:   Blood pressure 125/76, pulse 74, temperature 97.9 F (36.6 C), height 5\' 3"  (1.6 m), weight 210 lb (95.3 kg), SpO2 99 %. Body mass index is 37.2 kg/m.  General: Cooperative, alert, well developed, in no acute distress. HEENT: Conjunctivae and lids unremarkable. Cardiovascular: Regular rhythm.  Lungs: Normal work of breathing. Neurologic: No focal deficits.   Lab Results  Component Value Date   CREATININE 1.31 (H) 08/16/2020   BUN 23 08/16/2020   NA 137 08/16/2020   K 3.5 08/16/2020   CL 98 08/16/2020   CO2 24 08/16/2020   Lab Results  Component Value Date   ALT 17 08/16/2020   AST 23 08/16/2020   ALKPHOS 109 08/16/2020   BILITOT 0.8 08/16/2020   Lab Results  Component Value Date   HGBA1C 11.0 (H) 06/28/2020   HGBA1C 6.4 (A) 08/05/2019   HGBA1C 7.5 (H) 12/05/2018   HGBA1C 7.8 (H) 09/13/2018   HGBA1C 9.0 (H) 04/16/2018   Lab Results  Component Value Date   INSULIN 27.9 (H) 06/28/2020   INSULIN 8.7 04/16/2018   Lab Results  Component Value Date   TSH 2.220 04/16/2018   Lab Results  Component Value Date   CHOL 150 08/16/2020   HDL 30.70 (L) 08/16/2020   LDLCALC 120 (H) 06/28/2020   LDLDIRECT 89.0 08/16/2020   TRIG 228.0 (H) 08/16/2020   CHOLHDL 5 08/16/2020   Lab Results  Component Value Date   VD25OH 12.6 (L) 06/28/2020   VD25OH 30.7 12/05/2018   VD25OH 8.3 (L) 04/16/2018   Lab Results  Component Value Date   WBC 9.1 01/08/2020   HGB 13.5 01/08/2020   HCT 41.5 01/08/2020   MCV 92.2 01/08/2020   PLT 373 01/08/2020   No results found for: IRON, TIBC, FERRITIN  Attestation Statements:   Reviewed by  clinician on day of visit: allergies, medications, problem list, medical history, surgical history, family history, social history, and previous encounter notes.   I, Monica Clark, am acting as transcriptionist for Dennard Nip, MD.  I have reviewed the above documentation for accuracy and completeness, and I agree with the above. -  Dennard Nip, MD

## 2020-12-08 ENCOUNTER — Other Ambulatory Visit: Payer: Self-pay

## 2020-12-08 ENCOUNTER — Ambulatory Visit (INDEPENDENT_AMBULATORY_CARE_PROVIDER_SITE_OTHER): Payer: BC Managed Care – PPO | Admitting: Family Medicine

## 2020-12-08 ENCOUNTER — Encounter (INDEPENDENT_AMBULATORY_CARE_PROVIDER_SITE_OTHER): Payer: Self-pay | Admitting: Family Medicine

## 2020-12-08 VITALS — BP 133/73 | HR 68 | Temp 98.2°F | Ht 63.0 in | Wt 214.0 lb

## 2020-12-08 DIAGNOSIS — E1165 Type 2 diabetes mellitus with hyperglycemia: Secondary | ICD-10-CM

## 2020-12-08 DIAGNOSIS — Z6837 Body mass index (BMI) 37.0-37.9, adult: Secondary | ICD-10-CM

## 2020-12-08 MED ORDER — OZEMPIC (1 MG/DOSE) 4 MG/3ML ~~LOC~~ SOPN: 1.0000 mg | PEN_INJECTOR | SUBCUTANEOUS | 0 refills | Status: DC

## 2020-12-09 NOTE — Progress Notes (Signed)
Chief Complaint:   OBESITY Monica Clark is here to discuss her progress with her obesity treatment plan along with follow-up of her obesity related diagnoses. [name] is on following a lower carbohydrate, vegetable and lean protein rich diet plan and states she is following her eating plan approximately 70% of the time. [name] states she is doing 0 minutes 0 times per week.  Today's visit was #: 52 Starting weight: 216 lbs Starting date: 04/16/2018 Today's weight: 214 lbs Today's date: 12/08/2020 Total lbs lost to date: 2 Total lbs lost since last in-office visit: 0  Interim History: [name] has had increased stress and she is doing more comfort eating. The Low carbohydrate plan has been especially challenging.  Subjective:   1. Type 2 diabetes mellitus with hyperglycemia, without long-term current use of insulin (HCC) [name] is struggling with diet and weight loss. She has had some increase in nausea on Ozempic.  Assessment/Plan:   1. Type 2 diabetes mellitus with hyperglycemia, without long-term current use of insulin (HCC) We will refill Ozempic for 1 month. I discussed with Veleda how eating can affect nausea, and will continue to monitor. Good blood sugar control is important to decrease the likelihood of diabetic complications such as nephropathy, neuropathy, limb loss, blindness, coronary artery disease, and death. Intensive lifestyle modification including diet, exercise and weight loss are the first line of treatment for diabetes.   - Semaglutide, 1 MG/DOSE, (OZEMPIC, 1 MG/DOSE,) 4 MG/3ML SOPN; Inject 1 mg into the skin once a week.  Dispense: 1.5 mL; Refill: 0  2. Obesity with current BMI of 38.0 [name] is currently in the action stage of change. As such, her goal is to continue with weight loss efforts. She has agreed to keeping a food journal and adhering to recommended goals of 1400 calories and 80+ grams of protein daily.   Pre-packaged journaling plan at approximately 1400  calories and 80+ grams of protein.  Behavioral modification strategies: increasing lean protein intake and meal planning and cooking strategies.  [name] has agreed to follow-up with our clinic in 2 to 3 weeks. She was informed of the importance of frequent follow-up visits to maximize her success with intensive lifestyle modifications for her multiple health conditions.   Objective:   Blood pressure 133/73, pulse 68, temperature 98.2 F (36.8 C), height 5\' 3"  (1.6 m), weight 214 lb (97.1 kg), SpO2 98 %. Body mass index is 37.91 kg/m.  General: Cooperative, alert, well developed, in no acute distress. HEENT: Conjunctivae and lids unremarkable. Cardiovascular: Regular rhythm.  Lungs: Normal work of breathing. Neurologic: No focal deficits.   Lab Results  Component Value Date   CREATININE 1.31 (H) 08/16/2020   BUN 23 08/16/2020   NA 137 08/16/2020   K 3.5 08/16/2020   CL 98 08/16/2020   CO2 24 08/16/2020   Lab Results  Component Value Date   ALT 17 08/16/2020   AST 23 08/16/2020   ALKPHOS 109 08/16/2020   BILITOT 0.8 08/16/2020   Lab Results  Component Value Date   HGBA1C 11.0 (H) 06/28/2020   HGBA1C 6.4 (A) 08/05/2019   HGBA1C 7.5 (H) 12/05/2018   HGBA1C 7.8 (H) 09/13/2018   HGBA1C 9.0 (H) 04/16/2018   Lab Results  Component Value Date   INSULIN 27.9 (H) 06/28/2020   INSULIN 8.7 04/16/2018   Lab Results  Component Value Date   TSH 2.220 04/16/2018   Lab Results  Component Value Date   CHOL 150 08/16/2020   HDL 30.70 (L) 08/16/2020  LDLCALC 120 (H) 06/28/2020   LDLDIRECT 89.0 08/16/2020   TRIG 228.0 (H) 08/16/2020   CHOLHDL 5 08/16/2020   Lab Results  Component Value Date   VD25OH 12.6 (L) 06/28/2020   VD25OH 30.7 12/05/2018   VD25OH 8.3 (L) 04/16/2018   Lab Results  Component Value Date   WBC 9.1 01/08/2020   HGB 13.5 01/08/2020   HCT 41.5 01/08/2020   MCV 92.2 01/08/2020   PLT 373 01/08/2020   No results found for: IRON, TIBC,  FERRITIN  Attestation Statements:   Reviewed by clinician on day of visit: allergies, medications, problem list, medical history, surgical history, family history, social history, and previous encounter notes.  Time spent on visit including pre-visit chart review and post-visit care and charting was 35 minutes.    I, Trixie Dredge, am acting as transcriptionist for Dennard Nip, MD.  I have reviewed the above documentation for accuracy and completeness, and I agree with the above. -  Dennard Nip, MD

## 2020-12-24 ENCOUNTER — Other Ambulatory Visit (INDEPENDENT_AMBULATORY_CARE_PROVIDER_SITE_OTHER): Payer: Self-pay | Admitting: Family Medicine

## 2020-12-24 DIAGNOSIS — E1165 Type 2 diabetes mellitus with hyperglycemia: Secondary | ICD-10-CM

## 2020-12-27 NOTE — Telephone Encounter (Signed)
LAST APPOINTMENT DATE: 12/08/20 NEXT APPOINTMENT DATE: 01/03/21   [id number] [place], [place] - [address] AT [facility] [address] [place] [address] Phone: [phone] Fax: [phone]  [id number] [place], [place] - [address] [place] [address] Phone: [phone] Fax: [phone]  Patient is requesting a refill of the following medications: Requested Prescriptions   Pending Prescriptions Disp Refills   JARDIANCE 25 MG TABS tablet [Pharmacy Med Name: JARDIANCE 25MG  TABLETS] 30 tablet 0    Sig: TAKE 1 TABLET BY MOUTH EVERY DAY    Date last filled: 11/16/20 Previously prescribed by Dr. Leafy Ro  Lab Results  Component Value Date   HGBA1C 11.0 (H) 06/28/2020   HGBA1C 6.4 (A) 08/05/2019   HGBA1C 7.5 (H) 12/05/2018   Lab Results  Component Value Date   MICROALBUR 0.9 08/16/2020   LDLCALC 120 (H) 06/28/2020   CREATININE 1.31 (H) 08/16/2020   Lab Results  Component Value Date   VD25OH 12.6 (L) 06/28/2020   VD25OH 30.7 12/05/2018   VD25OH 8.3 (L) 04/16/2018    BP Readings from Last 3 Encounters:  12/08/20 133/73  11/16/20 125/76  10/18/20 118/73

## 2020-12-31 ENCOUNTER — Telehealth: Payer: Self-pay | Admitting: Family Medicine

## 2020-12-31 NOTE — Telephone Encounter (Signed)
error 

## 2021-01-03 ENCOUNTER — Encounter (INDEPENDENT_AMBULATORY_CARE_PROVIDER_SITE_OTHER): Payer: Self-pay | Admitting: Family Medicine

## 2021-01-03 ENCOUNTER — Ambulatory Visit (INDEPENDENT_AMBULATORY_CARE_PROVIDER_SITE_OTHER): Payer: BC Managed Care – PPO | Admitting: Family Medicine

## 2021-01-03 ENCOUNTER — Other Ambulatory Visit: Payer: Self-pay

## 2021-01-03 VITALS — BP 155/78 | HR 85 | Temp 97.9°F | Ht 63.0 in | Wt 216.0 lb

## 2021-01-03 DIAGNOSIS — Z9189 Other specified personal risk factors, not elsewhere classified: Secondary | ICD-10-CM | POA: Diagnosis not present

## 2021-01-03 DIAGNOSIS — E1165 Type 2 diabetes mellitus with hyperglycemia: Secondary | ICD-10-CM

## 2021-01-03 DIAGNOSIS — E559 Vitamin D deficiency, unspecified: Secondary | ICD-10-CM

## 2021-01-03 DIAGNOSIS — Z6838 Body mass index (BMI) 38.0-38.9, adult: Secondary | ICD-10-CM | POA: Diagnosis not present

## 2021-01-03 MED ORDER — VITAMIN D (ERGOCALCIFEROL) 1.25 MG (50000 UNIT) PO CAPS: 50000.0000 [IU] | ORAL_CAPSULE | ORAL | 0 refills | Status: DC

## 2021-01-03 MED ORDER — SEMAGLUTIDE (2 MG/DOSE) 8 MG/3ML ~~LOC~~ SOPN
2.0000 mg | PEN_INJECTOR | SUBCUTANEOUS | 0 refills | Status: DC
Start: 2021-01-03 — End: 2021-02-08

## 2021-01-03 NOTE — Progress Notes (Signed)
Chief Complaint:   OBESITY Jenie is here to discuss her progress with her obesity treatment plan along with follow-up of her obesity related diagnoses. Vicci is on keeping a food journal and adhering to recommended goals of 1400 calories and 80+ grams of protein daily and states she is following her eating plan approximately 75% of the time. Verbie states she is doing 0 minutes 0 times per week.  Today's visit was #: 28 Starting weight: 216 lbs Starting date: 04/16/2018 Today's weight: 216 lbs Today's date: 01/03/2021 Total lbs lost to date: 0 Total lbs lost since last in-office visit: 0  Interim History: Jazlyne has been working on weight loss, and she may be retaining some water weight. She notes hunger is improved, but she is still not yet controlled.  Subjective:   1. Type 2 diabetes mellitus with hyperglycemia, without long-term current use of insulin (HCC) Jacoba is working on diet and exercise, and she is not checking her BGs log. She still notes polyphagia. She has had a episode of feeling lightheaded and she hasn't been drinking much water.   2. Vitamin D deficiency Steffie is stable on Vit D, and she denies nausea, vomiting, or muscle weakness.  3. At risk for heart disease Alexandra is at a higher than average risk for cardiovascular disease due to obesity.   Assessment/Plan:   1. Type 2 diabetes mellitus with hyperglycemia, without long-term current use of insulin (HCC) Blanch Media agreed to increase Ozempic to 2 mg q weekly with no refills. She will continue [place], and she is to increase her water. We will recheck labs in 1 month. Good blood sugar control is important to decrease the likelihood of diabetic complications such as nephropathy, neuropathy, limb loss, blindness, coronary artery disease, and death. Intensive lifestyle modification including diet, exercise and weight loss are the first line of treatment for diabetes.   - Semaglutide, 2 MG/DOSE, 8 MG/3ML  SOPN; Inject 2 mg as directed once a week.  Dispense: 3 mL; Refill: 0  2. Vitamin D deficiency Low Vitamin D level contributes to fatigue and are associated with obesity, breast, and colon cancer. We will refill prescription Vitamin D for 1 month. We will check labs in 1 month. [place] will follow-up for routine testing of Vitamin D, at least 2-3 times per year to avoid over-replacement.  - Vitamin D, Ergocalciferol, (DRISDOL) 1.25 MG (50000 UNIT) CAPS capsule; Take 1 capsule (50,000 Units total) by mouth every 7 (seven) days.  Dispense: 4 capsule; Refill: 0  3. At risk for heart disease Mattia was given approximately 15 minutes of coronary artery disease prevention counseling today. She is 65 y.o. female and has risk factors for heart disease including obesity. We discussed intensive lifestyle modifications today with an emphasis on specific weight loss instructions and strategies.   Repetitive spaced learning was employed today to elicit superior memory formation and behavioral change.  4. Obesity with current BMI of 38.3 Ameirah is currently in the action stage of change. As such, her goal is to continue with weight loss efforts. She has agreed to the Category 2 Plan.   We will recheck fasting labs at her next visit.  Behavioral modification strategies: increasing water intake and holiday eating strategies .  Argentina has agreed to follow-up with our clinic in 3 to 4 weeks. She was informed of the importance of frequent follow-up visits to maximize her success with intensive lifestyle modifications for her multiple health conditions.   Objective:  Blood pressure (!) 155/78, pulse 85, temperature 97.9 F (36.6 C), height 5\' 3"  (1.6 m), weight 216 lb (98 kg), SpO2 99 %. Body mass index is 38.26 kg/m.  General: Cooperative, alert, well developed, in no acute distress. HEENT: Conjunctivae and lids unremarkable. Cardiovascular: Regular rhythm.  Lungs: Normal work of breathing. Neurologic: No  focal deficits.   Lab Results  Component Value Date   CREATININE 1.31 (H) 08/16/2020   BUN 23 08/16/2020   NA 137 08/16/2020   K 3.5 08/16/2020   CL 98 08/16/2020   CO2 24 08/16/2020   Lab Results  Component Value Date   ALT 17 08/16/2020   AST 23 08/16/2020   ALKPHOS 109 08/16/2020   BILITOT 0.8 08/16/2020   Lab Results  Component Value Date   HGBA1C 11.0 (H) 06/28/2020   HGBA1C 6.4 (A) 08/05/2019   HGBA1C 7.5 (H) 12/05/2018   HGBA1C 7.8 (H) 09/13/2018   HGBA1C 9.0 (H) 04/16/2018   Lab Results  Component Value Date   INSULIN 27.9 (H) 06/28/2020   INSULIN 8.7 04/16/2018   Lab Results  Component Value Date   TSH 2.220 04/16/2018   Lab Results  Component Value Date   CHOL 150 08/16/2020   HDL 30.70 (L) 08/16/2020   LDLCALC 120 (H) 06/28/2020   LDLDIRECT 89.0 08/16/2020   TRIG 228.0 (H) 08/16/2020   CHOLHDL 5 08/16/2020   Lab Results  Component Value Date   VD25OH 12.6 (L) 06/28/2020   VD25OH 30.7 12/05/2018   VD25OH 8.3 (L) 04/16/2018   Lab Results  Component Value Date   WBC 9.1 01/08/2020   HGB 13.5 01/08/2020   HCT 41.5 01/08/2020   MCV 92.2 01/08/2020   PLT 373 01/08/2020   No results found for: IRON, TIBC, FERRITIN  Attestation Statements:   Reviewed by clinician on day of visit: allergies, medications, problem list, medical history, surgical history, family history, social history, and previous encounter notes.   I, Trixie Dredge, am acting as transcriptionist for Dennard Nip, MD.  I have reviewed the above documentation for accuracy and completeness, and I agree with the above. -  Dennard Nip, MD

## 2021-01-06 ENCOUNTER — Other Ambulatory Visit: Payer: Self-pay

## 2021-01-06 DIAGNOSIS — N6099 Unspecified benign mammary dysplasia of unspecified breast: Secondary | ICD-10-CM

## 2021-01-07 ENCOUNTER — Other Ambulatory Visit: Payer: BC Managed Care – PPO

## 2021-01-07 ENCOUNTER — Ambulatory Visit: Payer: BC Managed Care – PPO | Admitting: Hematology

## 2021-01-11 ENCOUNTER — Other Ambulatory Visit (INDEPENDENT_AMBULATORY_CARE_PROVIDER_SITE_OTHER): Payer: Self-pay | Admitting: Family Medicine

## 2021-01-11 DIAGNOSIS — E119 Type 2 diabetes mellitus without complications: Secondary | ICD-10-CM

## 2021-01-11 DIAGNOSIS — Z794 Long term (current) use of insulin: Secondary | ICD-10-CM

## 2021-01-12 MED ORDER — ONETOUCH ULTRA VI STRP: ORAL_STRIP | 0 refills | Status: DC

## 2021-01-12 NOTE — Addendum Note (Signed)
Addended by: Neomia Dear on: 01/12/2021 10:12 AM   Modules accepted: Orders

## 2021-01-24 ENCOUNTER — Ambulatory Visit (INDEPENDENT_AMBULATORY_CARE_PROVIDER_SITE_OTHER): Payer: BC Managed Care – PPO | Admitting: Family Medicine

## 2021-01-28 ENCOUNTER — Inpatient Hospital Stay: Payer: BC Managed Care – PPO | Admitting: Physician Assistant

## 2021-01-28 ENCOUNTER — Inpatient Hospital Stay: Payer: BC Managed Care – PPO

## 2021-02-07 NOTE — Progress Notes (Deleted)
[facility] OFFICE PROGRESS NOTE  [name], MD [address]  [address]  DIAGNOSIS: Follow up left breast atypical ductal hyperplasia  PRIOR THERAPY:  Tamoxifen 20 mg daily, started on 11/30/2014, completed on 12/21/2019   CURRENT THERAPY: Observation   INTERVAL HISTORY: Monica Clark 65 y.o. female returns to the clinic for an annual follow up visit. The patient was last seen in the clinic by Dr. Burr Medico over 1 year ago on 01/08/20. At that time, she had completed her 5 years of tamoxifen and opted to follow in our clinic for surveillance. Last year, Dr. Burr Medico ordered a CT of the abdomen and pelvis due to the patient endorsing abdominal pain. The patient has a history of stage I endometrial cancer. The patient never had this scan performed. Her abdominal pain ***. She followed up with Dr. Denman George who ***.   Regarding her history of ADH, she is not up to date on her annual mammogram. She denies any new bone pain, headaches, masses, lymphadenopathy, overlying skin changes, nipple discharge.  Denies nausea, vomiting, diarrhea, or constipation. Denies changes in energy level.   MEDICAL HISTORY: Past Medical History:  Diagnosis Date   Breast mass, left    duct mass   Depression    Endometrial hyperplasia    Fuchs' corneal dystrophy    Gallbladder problem    Heart murmur    states no known problems, has never seen a cardiologist   Hypertension    states under control with med., has been on med. x 5 yr.   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  MEDICATIONS:  Current Outpatient Medications  Medication Sig Dispense Refill   glucose blood (ONETOUCH ULTRA) test strip TEST TWO TIMES A DAY 100 strip 0   Insulin Pen Needle 31G X 5 MM MISC Use once a day 100 each  1   JARDIANCE 25 MG TABS tablet TAKE 1 TABLET BY MOUTH EVERY DAY 30 tablet 0   metFORMIN (GLUCOPHAGE-XR) 500 MG 24 hr tablet Take 2 tablets (1,000 mg total) by mouth daily with supper. 180 tablet 3   rosuvastatin (CRESTOR) 5 MG tablet TAKE 1 TABLET BY MOUTH EVERY DAY 90 tablet 1   Semaglutide, 2 MG/DOSE, 8 MG/3ML SOPN Inject 2 mg as directed once a week. 3 mL 0   TRESIBA FLEXTOUCH 100 UNIT/ML FlexTouch Pen START WITH 20 UNITS EVERY MORNING AND ADJUST AS DIRECTED. MAX 40 UNITS DAILY REPLACING SOLIQUA 15 mL 3   triamterene-hydrochlorothiazide (MAXZIDE-25) 37.5-25 MG tablet Take 1 tablet by mouth every morning. Reported on 09/03/2015 30 tablet 0   Vitamin D, Ergocalciferol, (DRISDOL) 1.25 MG (50000 UNIT) CAPS capsule Take 1 capsule (50,000 Units total) by mouth every 7 (seven) days. 4 capsule 0   No current facility-administered medications for this visit.    SURGICAL HISTORY:  Past Surgical History:  Procedure Laterality Date   BREAST BIOPSY Left 04/30/2014   Procedure: LEFT BREAST BIOPSY;  Surgeon: Jackolyn Confer, MD;  Location: [facility];  Service: General;  Laterality: Left;   BREAST DUCTAL SYSTEM EXCISION Left 04/30/2014   Procedure: LEFT NIPPLE DUCT EXCISION;  Surgeon: Jackolyn Confer, MD;  Location: [facility];  Service: General;  Laterality: Left;   CESAREAN SECTION  [place] TOTAL HYSTERECTOMY WITH BILATERAL SALPINGO OOPHERECTOMY Bilateral 10/13/2014   Procedure: ROBOTIC ASSISTED TOTAL HYSTERECTOMY WITH BILATERAL SALPINGO OOPHORECTOMY ;  Surgeon: Everitt Amber, MD;  Location: [facility];  Service: Gynecology;  Laterality: Bilateral;    REVIEW OF SYSTEMS:   Review of Systems  Constitutional: Negative for appetite change, chills, fatigue, fever and unexpected weight change.  HENT:   Negative for mouth sores, nosebleeds, sore throat and trouble swallowing.   Eyes: Negative for eye problems and icterus.  Respiratory: Negative  for cough, hemoptysis, shortness of breath and wheezing.   Cardiovascular: Negative for chest pain and leg swelling.  Gastrointestinal: Negative for abdominal pain, constipation, diarrhea, nausea and vomiting.  Genitourinary: Negative for bladder incontinence, difficulty urinating, dysuria, frequency and hematuria.   Musculoskeletal: Negative for back pain, gait problem, neck pain and neck stiffness.  Skin: Negative for itching and rash.  Neurological: Negative for dizziness, extremity weakness, gait problem, headaches, light-headedness and seizures.  Hematological: Negative for adenopathy. Does not bruise/bleed easily.  Psychiatric/Behavioral: Negative for confusion, depression and sleep disturbance. The patient is not nervous/anxious.     PHYSICAL EXAMINATION:  There were no vitals taken for this visit.  ECOG PERFORMANCE STATUS: {CHL ONC ECOG [id number]  Physical Exam  Constitutional: Oriented to person, place, and time and well-developed, well-nourished, and in no distress. No distress.  HENT:  Head: Normocephalic and atraumatic.  Mouth/Throat: Oropharynx is clear and moist. No oropharyngeal exudate.  Eyes: Conjunctivae are normal. Right eye exhibits no discharge. Left eye exhibits no discharge. No scleral icterus.  Neck: Normal range of motion. Neck supple.  Cardiovascular: Normal rate, regular rhythm, normal heart sounds and intact distal pulses.   Pulmonary/Chest: Effort normal and breath sounds normal. No respiratory distress. No wheezes. No rales.  Abdominal: Soft. Bowel sounds are normal. Exhibits no distension and no mass. There is no tenderness.  Musculoskeletal: Normal range of motion. Exhibits no edema.  Lymphadenopathy:    No cervical adenopathy.  Neurological: Alert and oriented to person, place, and time. Exhibits normal muscle tone. Gait normal. Coordination normal.  Skin: Skin is warm and dry. No rash noted. Not diaphoretic. No erythema. No pallor.  Psychiatric:  Mood, memory and judgment normal.  Vitals reviewed.  LABORATORY DATA: Lab Results  Component Value Date   WBC 9.1 01/08/2020   HGB 13.5 01/08/2020   HCT 41.5 01/08/2020   MCV 92.2 01/08/2020   PLT 373 01/08/2020      Chemistry      Component Value Date/Time   NA 137 08/16/2020 0846   NA 139 06/28/2020 0828   NA 141 03/03/2016 1226   K 3.5 08/16/2020 0846   K 3.5 03/03/2016 1226   CL 98 08/16/2020 0846   CO2 24 08/16/2020 0846   CO2 24 03/03/2016 1226   BUN 23 08/16/2020 0846   BUN 12 06/28/2020 0828   BUN 8.5 03/03/2016 1226   CREATININE 1.31 (H) 08/16/2020 0846   CREATININE 0.82 01/08/2020 0911   CREATININE 0.8 03/03/2016 1226      Component Value Date/Time   CALCIUM 10.0 08/16/2020 0846   CALCIUM 10.0 03/03/2016 1226   ALKPHOS 109 08/16/2020 0846   ALKPHOS 118 03/03/2016 1226   AST 23 08/16/2020 0846   AST 21 01/08/2020 0911   AST 72 (H) 03/03/2016 1226   ALT 17 08/16/2020 0846   ALT 13 01/08/2020 0911   ALT 46 03/03/2016 1226   BILITOT 0.8 08/16/2020 0846   BILITOT 0.3  06/28/2020 0828   BILITOT 0.4 01/08/2020 0911   BILITOT 0.61 03/03/2016 1226       RADIOGRAPHIC STUDIES:  No results found.   ASSESSMENT/PLAN:  Monica Clark is a 65 y.o. female with     1. left breast atypical lobular hyperplasia -She was diagnosed in 04/30/2014 with ADH when she underwent left nipple duct excision and surgical biopsy.  -She was on chemoprevention with tamoxifen since 11/30/14, and completed 5-year treatment October 2021.  She overall tolerated well. -Breast exam today***, lab reviewed, no concern for new breast cancer. -Continue annual screening mammogram.  She would like to follow-up with [place] annually  2. Stage I endometrial cancer, 2016. -She underwent total hysterectomy with BSO on 10/13/14 by Dr. Denman George  -Given no other family history of malignancy, she does not meet the criteria for genetic testing (Dr. Burr Medico previously spoke with our genetic counselor)   3.  HTN, DM -She'll continue follow-up with her primary care physician -Her diabetes has gotten worse since she started tamoxifen, possible related, I encouraged her follow-up with her endocrinologist, eat healthy and exercise regularly.  Plan: -labs and follow up in 1 year         No orders of the defined types were placed in this encounter.    I spent {CHL ONC TIME VISIT - TDVVO:[id number]} counseling the patient face to face. The total time spent in the appointment was {CHL ONC TIME VISIT - IRSWN:[id number]}.  Balthazar Dooly L Jilliane Kazanjian, PA-C 02/07/21

## 2021-02-08 ENCOUNTER — Encounter (INDEPENDENT_AMBULATORY_CARE_PROVIDER_SITE_OTHER): Payer: Self-pay | Admitting: Family Medicine

## 2021-02-08 ENCOUNTER — Ambulatory Visit (INDEPENDENT_AMBULATORY_CARE_PROVIDER_SITE_OTHER): Payer: BC Managed Care – PPO | Admitting: Family Medicine

## 2021-02-08 ENCOUNTER — Other Ambulatory Visit: Payer: Self-pay

## 2021-02-08 VITALS — BP 140/74 | HR 70 | Temp 98.3°F | Ht 63.0 in | Wt 212.0 lb

## 2021-02-08 DIAGNOSIS — Z794 Long term (current) use of insulin: Secondary | ICD-10-CM

## 2021-02-08 DIAGNOSIS — E7849 Other hyperlipidemia: Secondary | ICD-10-CM

## 2021-02-08 DIAGNOSIS — I10 Essential (primary) hypertension: Secondary | ICD-10-CM | POA: Diagnosis not present

## 2021-02-08 DIAGNOSIS — E1169 Type 2 diabetes mellitus with other specified complication: Secondary | ICD-10-CM | POA: Diagnosis not present

## 2021-02-08 DIAGNOSIS — Z9189 Other specified personal risk factors, not elsewhere classified: Secondary | ICD-10-CM

## 2021-02-08 DIAGNOSIS — Z6838 Body mass index (BMI) 38.0-38.9, adult: Secondary | ICD-10-CM

## 2021-02-08 DIAGNOSIS — E559 Vitamin D deficiency, unspecified: Secondary | ICD-10-CM | POA: Diagnosis not present

## 2021-02-08 DIAGNOSIS — R7989 Other specified abnormal findings of blood chemistry: Secondary | ICD-10-CM

## 2021-02-08 DIAGNOSIS — M722 Plantar fascial fibromatosis: Secondary | ICD-10-CM

## 2021-02-08 MED ORDER — SEMAGLUTIDE (2 MG/DOSE) 8 MG/3ML ~~LOC~~ SOPN: 2.0000 mg | PEN_INJECTOR | SUBCUTANEOUS | 0 refills | Status: DC

## 2021-02-08 NOTE — Progress Notes (Signed)
Chief Complaint:   OBESITY Monica Clark is here to discuss her progress with her obesity treatment plan along with follow-up of her obesity related diagnoses. [name] is on the Category 2 Plan and states she is following her eating plan approximately 75% of the time. [name] states she is doing 0 minutes 0 times per week.  Today's visit was #: 72 Starting weight: 216 lbs Starting date: 04/16/2018 Today's weight: 212 lbs Today's date: 02/08/2021 Total lbs lost to date: 4 Total lbs lost since last in-office visit: 4  Interim History: [name] is doing well with diet and weight loss. She has been working on increasing lean protein and trying to decrease simple carbohydrates.  Subjective:   1. Type 2 diabetes mellitus with other specified complication, with long-term current use of insulin (HCC) [name] was unable to get Ozempic her last visit due to drug shortages. She would like to try again.  2. Primary hypertension [name]'s blood pressure is mildly elevated today. She hasn't had her morning medications yet.  3. Other hyperlipidemia [name] is on Crestor, and she is working on diet. No side effects were noted.  4. Vitamin D deficiency [name] is on Vit D, and she is due for labs.  5. Elevated serum creatinine [name]'s last creatinine was elevated. She is working on hydration. She is due to have labs rechecked.  6. Plantar fasciitis [name] has had a plantar fasciitis flare up recently, which limits her exercise.  7. At risk for heart disease [name] is at a higher than average risk for cardiovascular disease due to obesity.   Assessment/Plan:   1. Type 2 diabetes mellitus with other specified complication, with long-term current use of insulin (HCC) We will check labs today, and we will refill Ozempic for 1 month. Good blood sugar control is important to decrease the likelihood of diabetic complications such as nephropathy, neuropathy, limb loss, blindness, coronary artery disease, and death.  Intensive lifestyle modification including diet, exercise and weight loss are the first line of treatment for diabetes.   - CMP14+EGFR - Microalbumin / creatinine urine ratio - Hemoglobin A1c - Semaglutide, 2 MG/DOSE, 8 MG/3ML SOPN; Inject 2 mg as directed once a week.  Dispense: 3 mL; Refill: 0  2. Primary hypertension [name] will continue working on healthy weight loss and exercise to improve blood pressure control. We will check labs today, and will follow up at her next visit.  3. Other hyperlipidemia Cardiovascular risk and specific lipid/LDL goals reviewed. We discussed several lifestyle modifications today. We will check labs today. [name] will continue to work on diet, exercise and weight loss efforts. Orders and follow up as documented in patient record.   - Lipid Panel With LDL/HDL Ratio  4. Vitamin D deficiency We will check labs today. [name] will follow-up for routine testing of Vitamin D, at least 2-3 times per year to avoid over-replacement.  - VITAMIN D 25 Hydroxy (Vit-D Deficiency, Fractures)  5. Elevated serum creatinine We will check labs today, and will follow up at [name]'s next office visit.  6. Plantar fasciitis [name] was explained to about the causes for flare ups, and she was encouraged to wear good arch supports and do daily stretches.  7. At risk for heart disease [name] was given approximately 15 minutes of coronary artery disease prevention counseling today. She is 65 y.o. female and has risk factors for heart disease including obesity. We discussed intensive lifestyle modifications today with an emphasis on specific weight loss instructions and strategies.  Repetitive spaced learning was employed today to elicit superior memory formation and behavioral change.  8. Obesity with current BMI of 37.6 [name] is currently in the action stage of change. As such, her goal is to continue with weight loss efforts. She has agreed to keeping a food journal and adhering to  recommended goals of 1400 calories and 80+ grams of protein daily.   Behavioral modification strategies: increasing lean protein intake and holiday eating strategies .  [name] has agreed to follow-up with our clinic in 3 to 4 weeks. She was informed of the importance of frequent follow-up visits to maximize her success with intensive lifestyle modifications for her multiple health conditions.   [name] was informed we would discuss her lab results at her next visit unless there is a critical issue that needs to be addressed sooner. [name] agreed to keep her next visit at the agreed upon time to discuss these results.  Objective:   Blood pressure 140/74, pulse 70, temperature 98.3 F (36.8 C), height $RemoveBe'5\' 3"'AyVLxjbYR$  (1.6 m), weight 212 lb (96.2 kg), SpO2 98 %. Body mass index is 37.55 kg/m.  General: Cooperative, alert, well developed, in no acute distress. HEENT: Conjunctivae and lids unremarkable. Cardiovascular: Regular rhythm.  Lungs: Normal work of breathing. Neurologic: No focal deficits.   Lab Results  Component Value Date   CREATININE 1.31 (H) 08/16/2020   BUN 23 08/16/2020   NA 137 08/16/2020   K 3.5 08/16/2020   CL 98 08/16/2020   CO2 24 08/16/2020   Lab Results  Component Value Date   ALT 17 08/16/2020   AST 23 08/16/2020   ALKPHOS 109 08/16/2020   BILITOT 0.8 08/16/2020   Lab Results  Component Value Date   HGBA1C 11.0 (H) 06/28/2020   HGBA1C 6.4 (A) 08/05/2019   HGBA1C 7.5 (H) 12/05/2018   HGBA1C 7.8 (H) 09/13/2018   HGBA1C 9.0 (H) 04/16/2018   Lab Results  Component Value Date   INSULIN 27.9 (H) 06/28/2020   INSULIN 8.7 04/16/2018   Lab Results  Component Value Date   TSH 2.220 04/16/2018   Lab Results  Component Value Date   CHOL 150 08/16/2020   HDL 30.70 (L) 08/16/2020   LDLCALC 120 (H) 06/28/2020   LDLDIRECT 89.0 08/16/2020   TRIG 228.0 (H) 08/16/2020   CHOLHDL 5 08/16/2020   Lab Results  Component Value Date   VD25OH 12.6 (L) 06/28/2020   VD25OH  30.7 12/05/2018   VD25OH 8.3 (L) 04/16/2018   Lab Results  Component Value Date   WBC 9.1 01/08/2020   HGB 13.5 01/08/2020   HCT 41.5 01/08/2020   MCV 92.2 01/08/2020   PLT 373 01/08/2020   No results found for: IRON, TIBC, FERRITIN  Attestation Statements:   Reviewed by clinician on day of visit: allergies, medications, problem list, medical history, surgical history, family history, social history, and previous encounter notes.   I, Trixie Dredge, am acting as transcriptionist for Dennard Nip, MD.  I have reviewed the above documentation for accuracy and completeness, and I agree with the above. -  Dennard Nip, MD

## 2021-02-08 NOTE — Progress Notes (Signed)
Chief Complaint:   OBESITY Monica Clark is here to discuss her progress with her obesity treatment plan along with follow-up of her obesity related diagnoses. Monica Clark is on the Category 2 Plan and states she is following her eating plan approximately 80% of the time. Monica Clark states she is doing 0 minutes 0 times per week.  Today's visit was #: 77 Starting weight: 216 Starting date: 04/16/2018 Today's weight: 212 lbs Today's date: 02/08/2021 Total lbs lost to date: 4 Total lbs lost since last in-office visit: 4  Interim History: Monica Clark doing well with diet and weight loss. She has been working on increasing lean protein and trying to decrease simple carbohydrates.   Subjective:   1. Type 2 diabetes mellitus with other specified complication, with long-term current use of insulin (HCC) Monica Clark was able to get Ozempic her last visit due to drug shortages. She would like to try again.  2. Primary hypertension Monica Clark's blood pressure is mildly elevated today. She hasn't had her morning medications yet.  3. Other hyperlipidemia Monica Clark is on Crestor, and she is working on diet. No side effects were noted.  4. Vitamin D deficiency Monica Clark is on Vit D, and she is due for labs.  5. Elevated serum creatinine Monica Clark's last creatinine is elevated. She is working on hydration. She is due to have labs rechecked.  6. Plantar fasciitis Monica Clark has had a plantar fasciitis flare up recently, which limits her exercise.  7. At risk for heart disease Monica Clark is at a higher than average risk for cardiovascular disease due to obesity.   Assessment/Plan:   1. Type 2 diabetes mellitus with other specified complication, with long-term current use of insulin (HCC) We will check labs today, and we will refill Ozempic for 1 month. Good blood sugar control is important to decrease the likelihood of diabetic complications such as nephropathy, neuropathy, limb loss, blindness, coronary artery disease, and death. Intensive  lifestyle modification including diet, exercise and weight loss are the first line of treatment for diabetes.   - CMP14+EGFR - Microalbumin / creatinine urine ratio - Hemoglobin A1c - Semaglutide, 2 MG/DOSE, 8 MG/3ML SOPN; Inject 2 mg as directed once a week.  Dispense: 3 mL; Refill: 0  2. Primary hypertension Monica Clark will continue diet and exercise to improve blood pressure control. We will check labs today, and will follow up at her next office visit.   3. Other hyperlipidemia Cardiovascular risk and specific lipid/LDL goals reviewed. We discussed several lifestyle modifications today. We will check labs today. Monica Clark will continue to work on diet, exercise and weight loss efforts. Orders and follow up as documented in patient record.   - Lipid Panel With LDL/HDL Ratio  4. Vitamin D deficiency We will check labs today. Monica Clark will follow-up for routine testing of Vitamin D, at least 2-3 times per year to avoid over-replacement.  - VITAMIN D 25 Hydroxy (Vit-D Deficiency, Fractures)  5. Elevated serum creatinine We will check labs today, and we will follow up at Monica Clark's next office visit.  6. Plantar fasciitis Monica Clark was explained to the causes for flare ups, and she was encouraged to wear good arch supports and do daily stretches.  7. At risk for heart disease Monica Clark was given approximately 15 minutes of coronary artery disease prevention counseling today. She is 65 y.o. female and has risk factors for heart disease including obesity. We discussed intensive lifestyle modifications today with an emphasis on specific weight loss instructions and strategies.   Repetitive spaced learning  was employed today to elicit superior memory formation and behavioral change.  8. Obesity with current BMI of 37.6 Monica Clark is currently in the action stage of change. As such, her goal is to continue with weight loss efforts. She has agreed to keeping a food journal and adhering to recommended goals of 1400  calories and 80+ grams of protein daily.   Behavioral modification strategies: increasing lean protein intake and holiday eating strategies .  Monica Clark has agreed to follow-up with our clinic in 3 to 4 weeks. She was informed of the importance of frequent follow-up visits to maximize her success with intensive lifestyle modifications for her multiple health conditions.   Objective:   Blood pressure 140/74, pulse 70, temperature 98.3 F (36.8 C), height $RemoveBe'5\' 3"'odnkolZMP$  (1.6 m), weight 212 lb (96.2 kg), SpO2 98 %. Body mass index is 37.55 kg/m.  General: Cooperative, alert, well developed, in no acute distress. HEENT: Conjunctivae and lids unremarkable. Cardiovascular: Regular rhythm.  Lungs: Normal work of breathing. Neurologic: No focal deficits.   Lab Results  Component Value Date   CREATININE 1.31 (H) 08/16/2020   BUN 23 08/16/2020   NA 137 08/16/2020   K 3.5 08/16/2020   CL 98 08/16/2020   CO2 24 08/16/2020   Lab Results  Component Value Date   ALT 17 08/16/2020   AST 23 08/16/2020   ALKPHOS 109 08/16/2020   BILITOT 0.8 08/16/2020   Lab Results  Component Value Date   HGBA1C 11.0 (H) 06/28/2020   HGBA1C 6.4 (A) 08/05/2019   HGBA1C 7.5 (H) 12/05/2018   HGBA1C 7.8 (H) 09/13/2018   HGBA1C 9.0 (H) 04/16/2018   Lab Results  Component Value Date   INSULIN 27.9 (H) 06/28/2020   INSULIN 8.7 04/16/2018   Lab Results  Component Value Date   TSH 2.220 04/16/2018   Lab Results  Component Value Date   CHOL 150 08/16/2020   HDL 30.70 (L) 08/16/2020   LDLCALC 120 (H) 06/28/2020   LDLDIRECT 89.0 08/16/2020   TRIG 228.0 (H) 08/16/2020   CHOLHDL 5 08/16/2020   Lab Results  Component Value Date   VD25OH 12.6 (L) 06/28/2020   VD25OH 30.7 12/05/2018   VD25OH 8.3 (L) 04/16/2018   Lab Results  Component Value Date   WBC 9.1 01/08/2020   HGB 13.5 01/08/2020   HCT 41.5 01/08/2020   MCV 92.2 01/08/2020   PLT 373 01/08/2020   No results found for: IRON, TIBC,  FERRITIN  Attestation Statements:   Reviewed by clinician on day of visit: allergies, medications, problem list, medical history, surgical history, family history, social history, and previous encounter notes.   I, Trixie Dredge, am acting as transcriptionist for Dennard Nip, MD.  I have reviewed the above documentation for accuracy and completeness, and I agree with the above. -  Dennard Nip, MD

## 2021-02-09 ENCOUNTER — Telehealth (INDEPENDENT_AMBULATORY_CARE_PROVIDER_SITE_OTHER): Payer: Self-pay

## 2021-02-09 ENCOUNTER — Encounter: Payer: Self-pay | Admitting: Family Medicine

## 2021-02-09 ENCOUNTER — Ambulatory Visit: Payer: BC Managed Care – PPO | Admitting: Family Medicine

## 2021-02-09 VITALS — BP 160/87 | Ht 63.0 in | Wt 214.0 lb

## 2021-02-09 DIAGNOSIS — M722 Plantar fascial fibromatosis: Secondary | ICD-10-CM

## 2021-02-09 DIAGNOSIS — Z794 Long term (current) use of insulin: Secondary | ICD-10-CM

## 2021-02-09 DIAGNOSIS — M79672 Pain in left foot: Secondary | ICD-10-CM | POA: Diagnosis not present

## 2021-02-09 LAB — CMP14+EGFR
ALT: 9 IU/L (ref 0–32)
AST: 14 IU/L (ref 0–40)
Albumin/Globulin Ratio: 1.4 (ref 1.2–2.2)
Albumin: 4.7 g/dL (ref 3.8–4.8)
Alkaline Phosphatase: 114 IU/L (ref 44–121)
BUN/Creatinine Ratio: 21 (ref 12–28)
BUN: 19 mg/dL (ref 8–27)
Bilirubin Total: 0.3 mg/dL (ref 0.0–1.2)
CO2: 24 mmol/L (ref 20–29)
Calcium: 9.7 mg/dL (ref 8.7–10.3)
Chloride: 102 mmol/L (ref 96–106)
Creatinine, Ser: 0.92 mg/dL (ref 0.57–1.00)
Globulin, Total: 3.4 g/dL (ref 1.5–4.5)
Glucose: 183 mg/dL — ABNORMAL HIGH (ref 70–99)
Potassium: 4.2 mmol/L (ref 3.5–5.2)
Sodium: 142 mmol/L (ref 134–144)
Total Protein: 8.1 g/dL (ref 6.0–8.5)
eGFR: 70 mL/min/{1.73_m2} (ref >59)

## 2021-02-09 LAB — LIPID PANEL WITH LDL/HDL RATIO
Cholesterol, Total: 194 mg/dL (ref 100–199)
HDL: 40 mg/dL (ref >39)
LDL Chol Calc (NIH): 121 mg/dL — ABNORMAL HIGH (ref 0–99)
LDL/HDL Ratio: 3 ratio (ref 0.0–3.2)
Triglycerides: 189 mg/dL — ABNORMAL HIGH (ref 0–149)
VLDL Cholesterol Cal: 33 mg/dL (ref 5–40)

## 2021-02-09 LAB — HEMOGLOBIN A1C
Est. average glucose Bld gHb Est-mCnc: 212 mg/dL
Hgb A1c MFr Bld: 9 % — ABNORMAL HIGH (ref 4.8–5.6)

## 2021-02-09 LAB — VITAMIN D 25 HYDROXY (VIT D DEFICIENCY, FRACTURES): Vit D, 25-Hydroxy: 24.7 ng/mL — ABNORMAL LOW (ref 30.0–100.0)

## 2021-02-09 LAB — MICROALBUMIN / CREATININE URINE RATIO
Creatinine, Urine: 67.8 mg/dL
Microalb/Creat Ratio: 6 mg/g creat (ref 0–29)
Microalbumin, Urine: 3.9 ug/mL

## 2021-02-09 MED ORDER — DICLOFENAC SODIUM 75 MG PO TBEC: 75.0000 mg | DELAYED_RELEASE_TABLET | Freq: Two times a day (BID) | ORAL | 1 refills | Status: DC

## 2021-02-09 NOTE — Progress Notes (Addendum)
PCP: [name], MD  Subjective:   HPI: Patient is a 65 y.o. female here for left heel pain.  She reports she's had intermittent left heel pain for many years. It will typically flare 2-3x per year. This particular episode started 3 days ago. It has gotten progressively worse over the past 3 days that she is now walking with a cane. Pain is located on plantar aspect of left heel and extends slightly into posterior aspect of heel. The pain is worse with weight bearing and walking- particularly after she's been sitting or immobile for a while. No known injury or trauma. No recent change in her activity level.   She has tried various stretching exercises, a shoe insert, a lidocaine patch, biofreeze, and a heel sleeve- none of which have provided significant relief. She notes that in general, her heel feels better when she wears shoes with a slight heel and worse when she wears flats or tennis shoes.  Past Medical History:  Diagnosis Date   Breast mass, left    duct mass   Depression    Endometrial hyperplasia    Fuchs' corneal dystrophy    Gallbladder problem    Heart murmur    states no known problems, has never seen a cardiologist   Hypertension    states under control with med., has been on med. x 5 yr.   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 180 tablet 3   rosuvastatin (CRESTOR) 5 MG tablet TAKE 1 TABLET BY MOUTH EVERY DAY 90  tablet 1   Semaglutide, 2 MG/DOSE, 8 MG/3ML SOPN Inject 2 mg as directed once a week. 3 mL 0   TRESIBA FLEXTOUCH 100 UNIT/ML FlexTouch Pen START WITH 20 UNITS EVERY MORNING AND ADJUST AS DIRECTED. MAX 40 UNITS DAILY REPLACING SOLIQUA 15 mL 3   triamterene-hydrochlorothiazide (MAXZIDE-25) 37.5-25 MG tablet Take 1 tablet by mouth every morning. Reported on 09/03/2015 30 tablet 0   Vitamin D, Ergocalciferol, (DRISDOL) 1.25 MG (50000 UNIT) CAPS capsule Take 1 capsule (50,000 Units total) by mouth every 7 (seven) days. 4 capsule 0   No current facility-administered medications on file prior to visit.    Past Surgical History:  Procedure Laterality Date   BREAST BIOPSY Left 04/30/2014   Procedure: LEFT BREAST BIOPSY;  Surgeon: Jackolyn Confer, MD;  Location: [facility];  Service: General;  Laterality: Left;   BREAST DUCTAL SYSTEM EXCISION Left 04/30/2014   Procedure: LEFT NIPPLE DUCT EXCISION;  Surgeon: Jackolyn Confer, MD;  Location: [facility];  Service: General;  Laterality: Left;   [place]   ROBOTIC ASSISTED TOTAL HYSTERECTOMY WITH BILATERAL SALPINGO OOPHERECTOMY Bilateral 10/13/2014   Procedure: ROBOTIC ASSISTED TOTAL HYSTERECTOMY WITH BILATERAL SALPINGO OOPHORECTOMY ;  Surgeon: Everitt Amber, MD;  Location: [facility];  Service: Gynecology;  Laterality: Bilateral;    Allergies  Allergen Reactions   Codeine Other (See Comments)    INSOMNIA    BP (!) 160/87    Ht 5\' 3"  (1.6 m)    Wt 214 lb (97.1 kg)    BMI 37.91 kg/m   No flowsheet data found.  No flowsheet data found.      Objective:  Physical Exam:  Gen: NAD, comfortable in exam room  Left Foot: Inspection: No obvious bony deformity.  No swelling, erythema, or bruising.   Palpation: TTP at medial calcaneus at insertion of plantar fascia, TTP extends into calcaneal fat pad and slightly into posterior aspect of heel. Pain significantly worse with dorsiflexion. No TTP of  achilles. Mild pain calcaneal squeeze. ROM: Full ROM of the ankle. Limited dorsiflexion, 90 degrees. Strength: 5/5 strength ankle in all planes Neurovascular: N/V intact distally in the lower extremity Gait: severely antalgic secondary to left heel pain   Assessment & Plan:  1. Left Heel Pain; Plantar Fasciitis Patient with 3 days of worsening left heel pain. Presentation and exam concerning for plantar fasciitis given pain w/weight bearing especially after period of immobilization, tenderness over calcaneus at insertion of plantar fascia, and limited dorsiflexion. Although she was previously diagnosed with achilles tendonitis in the past, there is no tenderness over achilles today to suggest this. Given severity of her symptoms (interfering w/ability to walk), Rx sent for oral Diclofenac. Also recommended arch strap, [place] sock, supportive footwear, and stretching exercises. Discussed physical therapy and walking boot as additional options if necessary.    Alcus Dad, MD PGY-2 [place]

## 2021-02-09 NOTE — Patient Instructions (Signed)
You have plantar fasciitis Take diclofenac 75mg  twice a day with food for pain and inflammation. Plantar fascia stretch for 20-30 seconds (do 3 of these) in morning Lowering/raise on a step exercises 3 x 10 once or twice a day - this is very important for long term recovery. Ice heel for 15 minutes as needed. Continue with the massager you're using. Avoid flat shoes/barefoot walking as much as possible. Arch straps have been shown to help with pain - use these when up and walking around. Inserts are important - put these plastic ones under something more cushioned though like we did today. Steroid injection is a consideration for short term pain relief if you are struggling though your pain is more further back in the heel than this would typically help. Physical therapy is also an option. Consider [place] sock at bedtime Consider short term use of a walking boot if not getting better. Follow up with me in 4 weeks.

## 2021-02-09 NOTE — Telephone Encounter (Signed)
Patient is requesting that (3) Boxes of Ozempic prescription be sent to [facility] on [address].  The pharmacy will hold this until 3:00.  Patient states she hasn't been able to get this for over a month.  Thank you

## 2021-02-10 MED ORDER — SEMAGLUTIDE (2 MG/DOSE) 8 MG/3ML ~~LOC~~ SOPN: 2.0000 mg | PEN_INJECTOR | SUBCUTANEOUS | 2 refills | Status: DC

## 2021-02-10 NOTE — Telephone Encounter (Signed)
Ok x 90 days  

## 2021-02-10 NOTE — Telephone Encounter (Signed)
Please see message and advise.  Thank you. ° °

## 2021-02-11 ENCOUNTER — Telehealth: Payer: Self-pay | Admitting: Physician Assistant

## 2021-02-11 ENCOUNTER — Telehealth: Payer: Self-pay | Admitting: Hematology

## 2021-02-11 ENCOUNTER — Inpatient Hospital Stay: Payer: BC Managed Care – PPO

## 2021-02-11 ENCOUNTER — Inpatient Hospital Stay: Payer: BC Managed Care – PPO | Admitting: Physician Assistant

## 2021-02-11 NOTE — Telephone Encounter (Signed)
The patient was scheduled for a 1 year follow up today. She did not show up to her appointment. I called the patient but was unable to reach her. I left a voicemail asking her to call us back to reschedule. I also will send a scheduling message to reach out to the patient to ensure that she has a follow up appointment.

## 2021-02-11 NOTE — Telephone Encounter (Signed)
Scheduled per sch msg. Called and left msg  

## 2021-02-17 ENCOUNTER — Ambulatory Visit: Payer: BC Managed Care – PPO | Admitting: Endocrinology

## 2021-02-18 ENCOUNTER — Ambulatory Visit: Payer: BC Managed Care – PPO | Admitting: Endocrinology

## 2021-02-18 ENCOUNTER — Other Ambulatory Visit: Payer: Self-pay | Admitting: Family Medicine

## 2021-02-18 ENCOUNTER — Other Ambulatory Visit: Payer: Self-pay

## 2021-02-18 ENCOUNTER — Encounter: Payer: Self-pay | Admitting: Endocrinology

## 2021-02-18 VITALS — BP 116/72 | HR 84 | Ht 63.0 in | Wt 213.4 lb

## 2021-02-18 DIAGNOSIS — Z6838 Body mass index (BMI) 38.0-38.9, adult: Secondary | ICD-10-CM

## 2021-02-18 DIAGNOSIS — Z794 Long term (current) use of insulin: Secondary | ICD-10-CM | POA: Diagnosis not present

## 2021-02-18 DIAGNOSIS — E782 Mixed hyperlipidemia: Secondary | ICD-10-CM

## 2021-02-18 DIAGNOSIS — R2231 Localized swelling, mass and lump, right upper limb: Secondary | ICD-10-CM

## 2021-02-18 DIAGNOSIS — E1165 Type 2 diabetes mellitus with hyperglycemia: Secondary | ICD-10-CM

## 2021-02-18 NOTE — Progress Notes (Signed)
Patient ID: Monica Clark, female   DOB: [birth year], 65 y.o.   MRN: [id number]           Reason for Appointment: Follow-up for Type 2 Diabetes  Referring physician: [name]   History of Present Illness:          Date of diagnosis of type 2 diabetes mellitus: 2010        Recent history:       Non-insulin hypoglycemic drugs: Jardiance 25 mg daily, Ozempic  2 mg weekly, metformin ER 1000 mg daily  INSULIN: Tresiba 30 units daily  Current management, blood sugar patterns and problems identified:  Her A1c is 9% compared to previous 11 and has been as low as 6.4 previously  She has not come back for follow-up since 6/22   She did bring her meter but is using a very old One Touch ultra meter and this could not be downloaded Appears to be checking mostly fasting blood sugars  Except for 2 good readings around 150 she has blood sugars mostly around 200 or more  She was told to start walking on her last visit but she still has not started doing so despite her foot pain being improved  She says she was out of her Ozempic for about a month until recently and blood sugars are higher Otherwise her blood sugars are still around 160 fasting  She was started on 20 units of Tresiba on her last visit but even with taking 30 units her fasting readings have been consistently high even with Ozempic and Jardiance She has been advised on meal planning by the dietitian at the weight loss program  She is still being followed by the weight loss program but since her last visit in June she has gained 5 pounds       Side effects from medications have been: Diarrhea from metformin  Compliance with the medical regimen: Variable Hypoglycemia: None    Glucose monitoring:  done 0-1  times a day         Glucometer:  One Touch Ultra  Blood Glucose readings by    FASTING blood sugar range recently 147-258, previously 190-259   Self-care: The diet that the patient has been following is: tries to  limit carbs.      Typical meal intake: Breakfast is Egg sandwich/cereal  Lunch is chicken, vegetables, fruit and dinner is starch, meat and fruit.  Snacks: Granola bar and fruit               Dietician visit, most recent: 2013                Weight history:209-229  Wt Readings from Last 3 Encounters:  02/18/21 213 lb 6.4 oz (96.8 kg)  02/09/21 214 lb (97.1 kg)  02/08/21 212 lb (96.2 kg)    Glycemic control:   Lab Results  Component Value Date   HGBA1C 9.0 (H) 02/08/2021   HGBA1C 11.0 (H) 06/28/2020   HGBA1C 6.4 (A) 08/05/2019   Lab Results  Component Value Date   MICROALBUR 0.9 08/16/2020   LDLCALC 121 (H) 02/08/2021   CREATININE 0.92 02/08/2021     Lab Results  Component Value Date   MICRALBCREAT 6 02/08/2021    Background history:   She is not clear when her diabetes for diagnosis but had a high A1c in 2010 She was on various oral medications over the last few years including Januvia  Amaryl was probably started in 2015 in addition to  metformin She thinks her control was good only in the first few years, A1c 2016 was 9.5 She was without medication for some time in December 2018 when A1c was 15+  A1c in December 2019 was 15.5 and usually over 10    Allergies as of 02/18/2021       Reactions   Codeine Other (See Comments)   INSOMNIA        Medication List        Accurate as of February 18, 2021  9:19 AM. If you have any questions, ask your nurse or doctor.          diclofenac 75 MG EC tablet Commonly known as: VOLTAREN Take 1 tablet (75 mg total) by mouth 2 (two) times daily.   Insulin Pen Needle 31G X 5 MM Misc Use once a day   Jardiance 25 MG Tabs tablet Generic drug: empagliflozin TAKE 1 TABLET BY MOUTH EVERY DAY   metFORMIN 500 MG 24 hr tablet Commonly known as: GLUCOPHAGE-XR Take 2 tablets (1,000 mg total) by mouth daily with supper.   OneTouch Ultra test strip Generic drug: glucose blood TEST TWO TIMES A DAY   rosuvastatin 5  MG tablet Commonly known as: CRESTOR TAKE 1 TABLET BY MOUTH EVERY DAY   Semaglutide (2 MG/DOSE) 8 MG/3ML Sopn Inject 2 mg as directed once a week.   Tyler Aas FlexTouch 100 UNIT/ML FlexTouch Pen Generic drug: insulin degludec START WITH 20 UNITS EVERY MORNING AND ADJUST AS DIRECTED. MAX 40 UNITS DAILY REPLACING SOLIQUA   triamterene-hydrochlorothiazide 37.5-25 MG tablet Commonly known as: MAXZIDE-25 Take 1 tablet by mouth every morning. Reported on 09/03/2015   Vitamin D (Ergocalciferol) 1.25 MG (50000 UNIT) Caps capsule Commonly known as: DRISDOL Take 1 capsule (50,000 Units total) by mouth every 7 (seven) days.        Allergies:  Allergies  Allergen Reactions   Codeine Other (See Comments)    INSOMNIA    Past Medical History:  Diagnosis Date   Breast mass, left    duct mass   Depression    Endometrial hyperplasia    Fuchs' corneal dystrophy    Gallbladder problem    Heart murmur    states no known problems, has never seen a cardiologist   Hypertension    states under control with med., has been on med. x 5 yr.   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[place])    Nipple discharge 04/2014   left breast   Non-insulin dependent type 2 diabetes mellitus [facility])     Past Surgical History:  Procedure Laterality Date   BREAST BIOPSY Left 04/30/2014   Procedure: LEFT BREAST BIOPSY;  Surgeon: Jackolyn Confer, MD;  Location: [place];  Service: General;  Laterality: Left;   BREAST DUCTAL SYSTEM EXCISION Left 04/30/2014   Procedure: LEFT NIPPLE DUCT EXCISION;  Surgeon: Jackolyn Confer, MD;  Location: [place];  Service: General;  Laterality: Left;   [place]   ROBOTIC ASSISTED TOTAL HYSTERECTOMY WITH BILATERAL SALPINGO OOPHERECTOMY Bilateral 10/13/2014   Procedure: ROBOTIC ASSISTED TOTAL HYSTERECTOMY WITH BILATERAL SALPINGO OOPHORECTOMY ;  Surgeon: Everitt Amber, MD;   Location: [facility];  Service: Gynecology;  Laterality: Bilateral;    Family History  Problem Relation Age of Onset   Diabetes Mother    Obesity Mother    Heart disease Father    Cancer Father  throat cancer    Sudden death Father    Alcoholism Father    Diabetes Sister    Diabetes Brother     Social History:  reports that she has never smoked. She has never used smokeless tobacco. She reports current alcohol use. She reports that she does not use drugs.   Review of Systems   Lipid history: Has mixed hyperlipidemia  Was started on Crestor 5 mg in 2021 and LDL is variably controlled   However she tends to have persistently high triglycerides Previously had been prescribed fenofibrate      Lab Results  Component Value Date   CHOL 194 02/08/2021   CHOL 150 08/16/2020   CHOL 202 (H) 06/28/2020   Lab Results  Component Value Date   HDL 40 02/08/2021   HDL 30.70 (L) 08/16/2020   HDL 33 (L) 06/28/2020   Lab Results  Component Value Date   LDLCALC 121 (H) 02/08/2021   LDLCALC 120 (H) 06/28/2020   LDLCALC 126 (H) 04/16/2018   Lab Results  Component Value Date   TRIG 189 (H) 02/08/2021   TRIG 228.0 (H) 08/16/2020   TRIG 277 (H) 06/28/2020   Lab Results  Component Value Date   CHOLHDL 5 08/16/2020   CHOLHDL 6 07/28/2019   CHOLHDL 6 12/03/2018   Lab Results  Component Value Date   LDLDIRECT 89.0 08/16/2020   LDLDIRECT 121.0 07/28/2019   LDLDIRECT 110.0 12/03/2018    Hypertension: Currently treated with only Maxzide by her PCP Also on Jardiance  Not on ACE inhibitor/ARB  Her creatinine appears to be relatively better  Lab Results  Component Value Date   CREATININE 0.92 02/08/2021   CREATININE 1.31 (H) 08/16/2020   CREATININE 0.71 06/28/2020    Last microalbumin normal  LABS:  No visits with results within 1 Week(s) from this visit.  Latest known visit with results is:  Office Visit on 02/08/2021  Component Date Value Ref Range Status    Glucose 02/08/2021 183 (H)  70 - 99 mg/dL Final   BUN 02/08/2021 19  8 - 27 mg/dL Final   Creatinine, Ser 02/08/2021 0.92  0.57 - 1.00 mg/dL Final   eGFR 02/08/2021 70  >59 mL/min/1.73 Final   BUN/Creatinine Ratio 02/08/2021 21  12 - 28 Final   Sodium 02/08/2021 142  134 - 144 mmol/L Final   Potassium 02/08/2021 4.2  3.5 - 5.2 mmol/L Final   Chloride 02/08/2021 102  96 - 106 mmol/L Final   CO2 02/08/2021 24  20 - 29 mmol/L Final   Calcium 02/08/2021 9.7  8.7 - 10.3 mg/dL Final   Total Protein 02/08/2021 8.1  6.0 - 8.5 g/dL Final   Albumin 02/08/2021 4.7  3.8 - 4.8 g/dL Final   Globulin, Total 02/08/2021 3.4  1.5 - 4.5 g/dL Final   Albumin/Globulin Ratio 02/08/2021 1.4  1.2 - 2.2 Final   Bilirubin Total 02/08/2021 0.3  0.0 - 1.2 mg/dL Final   Alkaline Phosphatase 02/08/2021 114  44 - 121 IU/L Final   AST 02/08/2021 14  0 - 40 IU/L Final   ALT 02/08/2021 9  0 - 32 IU/L Final   Creatinine, Urine 02/08/2021 67.8  Not Estab. mg/dL Final   Microalbumin, Urine 02/08/2021 3.9  Not Estab. ug/mL Final   Microalb/Creat Ratio 02/08/2021 6  0 - 29 mg/g creat Final   Comment:                        Normal:  0 -  29                        Moderately increased: 30 - 300                        Severely increased:       >300    Vit D, 25-Hydroxy 02/08/2021 24.7 (L)  30.0 - 100.0 ng/mL Final   Comment: Vitamin D deficiency has been defined by the [place] practice guideline as a level of serum 25-OH vitamin D less than 20 ng/mL (1,2). The Endocrine Society went on to further define vitamin D insufficiency as a level between 21 and 29 ng/mL (2). 1. IOM (Institute of Medicine). 2010. Dietary reference    intakes for calcium and D. [place]: The    [employer]. 2. Holick MF, Binkley Newell, Bischoff-Ferrari HA, et al.    Evaluation, treatment, and prevention of vitamin D    deficiency: an Endocrine Society clinical practice     guideline. JCEM. 2011 Jul; 96(7):1911-30.    Hgb A1c MFr Bld 02/08/2021 9.0 (H)  4.8 - 5.6 % Final   Comment:          Prediabetes: 5.7 - 6.4          Diabetes: >6.4          Glycemic control for adults with diabetes: <7.0    Est. average glucose Bld gHb Est-m* 02/08/2021 212  mg/dL Final   Cholesterol, Total 02/08/2021 194  100 - 199 mg/dL Final   Triglycerides 02/08/2021 189 (H)  0 - 149 mg/dL Final   HDL 02/08/2021 40  >39 mg/dL Final   VLDL Cholesterol Cal 02/08/2021 33  5 - 40 mg/dL Final   LDL Chol Calc (NIH) 02/08/2021 121 (H)  0 - 99 mg/dL Final   LDL/HDL Ratio 02/08/2021 3.0  0.0 - 3.2 ratio Final   Comment:                                     LDL/HDL Ratio                                             Men  Women                               1/2 Avg.Risk  1.0    1.5                                   Avg.Risk  3.6    3.2                                2X Avg.Risk  6.2    5.0                                3X Avg.Risk  8.0    6.1     Physical Examination:  BP 116/72    Pulse 84  Ht '5\' 3"'$  (1.6 m)    Wt 213 lb 6.4 oz (96.8 kg)    SpO2 96%    BMI 37.80 kg/m          ASSESSMENT:  Diabetes type 2, with obesity  See history of present illness for detailed discussion of current diabetes management, blood sugar patterns and problems identified  Her A1c is still significantly high at 9%  Current regimen includes basal insulin, Ozempic, Jardiance and metformin  Despite being on the weight loss management program she has gained weight Has not started exercise as recommended Given with Ozempic and 30 units of basal insulin her blood sugars have been mostly around 200 Likely has higher readings as judged by her A1c at least after meals   LIPIDS: Not adequately managed currently and labs have been ordered by weight management clinic  Hypertension: Blood pressure is normal  Vitamin D deficiency: To be addressed by her weight management specialist  PLAN:   She will need  to make sure she takes enough insulin to cover her basal insulin requirements but also regular administration of Ozempic   Reminded her how basal insulin works, timing of injection, and duration of action.  Today discussed titration based on fasting blood sugar every 3 days by 4 units and at target of 90-130 for fasting reading.  Given a flowsheet with instructions on how to keep a record and adjust the doses She will increase her dose to 34 units and take the same dose daily for 3 days and not arbitrarily Adjusting it  Take Ozempic regularly Will also consider increasing Ozempic to 2 mg However if she does not appear to have any weight loss again or increased postprandial reading may consider switching to [facility] She needs to start checking blood sugars after meals regularly, discussed blood sugar targets after meals also New prescription for test strips given, she will switch to the One Touch Verio monitor  Start regular exercise  More regular follow-up  We will discuss management of her lipids on the next visit if not already addressed   Patient Instructions  Check blood sugars on waking up 5-6 days a week  Also check blood sugars about 2 hours after meals and do this after different meals by rotation  Recommended blood sugar levels on waking up are 90-130 and about 2 hours after meal is 130-160  Please bring your blood sugar monitor to each visit, thank you  Total visit time including counseling = 30 minutes  Elayne Snare 02/18/2021, 9:19 AM   Note: This office note was prepared with Dragon voice recognition system technology. Any transcriptional errors that result from this process are unintentional.

## 2021-02-18 NOTE — Patient Instructions (Signed)
Check blood sugars on waking up 5-6 days a week  Also check blood sugars about 2 hours after meals and do this after different meals by rotation  Recommended blood sugar levels on waking up are 90-130 and about 2 hours after meal is 130-160  Please bring your blood sugar monitor to each visit, thank you

## 2021-02-21 NOTE — Progress Notes (Deleted)
[place]   [phone] [phone]   Clinic Follow up Note   Patient Care Team: Monica Small, MD as PCP - General (Family Medicine) Monica Amber, MD as Consulting Physician (Obstetrics and Gynecology) 02/21/2021  CHIEF COMPLAINT: Follow up [facility]   CURRENT THERAPY: Tamoxifen 20 mg daily, October 2016 - 2021  INTERVAL HISTORY: [name] returns for follow up as scheduled. Last seen by Dr. Burr Clark 01/08/20. She is overdue for mammogram.    REVIEW OF SYSTEMS:   Constitutional: Denies fevers, chills or abnormal weight loss Eyes: Denies blurriness of vision Ears, nose, mouth, throat, and face: Denies mucositis or sore throat Respiratory: Denies cough, dyspnea or wheezes Cardiovascular: Denies palpitation, chest discomfort or lower extremity swelling Gastrointestinal:  Denies nausea, heartburn or change in bowel habits Skin: Denies abnormal skin rashes Lymphatics: Denies new lymphadenopathy or easy bruising Neurological:Denies numbness, tingling or new weaknesses Behavioral/Psych: Mood is stable, no new changes  All other systems were reviewed with the patient and are negative.  MEDICAL HISTORY:  Past Medical History:  Diagnosis Date   Breast mass, left    duct mass   Depression    Endometrial hyperplasia    Fuchs' corneal dystrophy    Gallbladder problem    Heart murmur    states no known problems, has never seen a cardiologist   Hypertension    states under control with med., has been on med. x 5 yr.   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[place])    Nipple discharge 04/2014   left breast   Non-insulin dependent type 2 diabetes mellitus ([place])     SURGICAL HISTORY: Past Surgical History:  Procedure Laterality Date   BREAST BIOPSY Left 04/30/2014   Procedure: LEFT BREAST BIOPSY;  Surgeon: Jackolyn Confer, MD;  Location: [place];  Service: General;  Laterality: Left;   BREAST DUCTAL  SYSTEM EXCISION Left 04/30/2014   Procedure: LEFT NIPPLE DUCT EXCISION;  Surgeon: Jackolyn Confer, MD;  Location: [place];  Service: General;  Laterality: Left;   [place]   ROBOTIC ASSISTED TOTAL HYSTERECTOMY WITH BILATERAL SALPINGO OOPHERECTOMY Bilateral 10/13/2014   Procedure: ROBOTIC ASSISTED TOTAL HYSTERECTOMY WITH BILATERAL SALPINGO OOPHORECTOMY ;  Surgeon: Monica Amber, MD;  Location: [facility];  Service: Gynecology;  Laterality: Bilateral;    I have reviewed the social history and family history with the patient and they are unchanged from previous note.  ALLERGIES:  is allergic to codeine.  MEDICATIONS:  Current Outpatient Medications  Medication Sig Dispense Refill   diclofenac (VOLTAREN) 75 MG EC tablet Take 1 tablet (75 mg total) by mouth 2 (two) times daily. (Patient not taking: Reported on 02/18/2021) 60 tablet 1   glucose blood (ONETOUCH ULTRA) test strip TEST TWO TIMES A DAY 100 strip 0   Insulin Pen Needle 31G X 5 MM MISC Use once a day 100 each 1   JARDIANCE 25 MG TABS tablet TAKE 1 TABLET BY MOUTH EVERY DAY 30 tablet 0   metFORMIN (GLUCOPHAGE-XR) 500 MG 24 hr tablet Take 2 tablets (1,000 mg total) by mouth daily with supper. 180 tablet 3   rosuvastatin (CRESTOR) 5 MG tablet TAKE 1 TABLET BY MOUTH EVERY DAY 90 tablet 1   Semaglutide, 2 MG/DOSE, 8 MG/3ML SOPN Inject 2 mg as directed once a week. 3 mL 2   TRESIBA FLEXTOUCH 100 UNIT/ML FlexTouch Pen START WITH 20 UNITS  EVERY MORNING AND ADJUST AS DIRECTED. MAX 40 UNITS DAILY REPLACING SOLIQUA 15 mL 3   triamterene-hydrochlorothiazide (MAXZIDE-25) 37.5-25 MG tablet Take 1 tablet by mouth every morning. Reported on 09/03/2015 30 tablet 0   Vitamin D, Ergocalciferol, (DRISDOL) 1.25 MG (50000 UNIT) CAPS capsule Take 1 capsule (50,000 Units total) by mouth every 7 (seven) days. 4 capsule 0   No current facility-administered medications for this visit.    PHYSICAL  EXAMINATION: ECOG PERFORMANCE STATUS: {CHL ONC ECOG PS:[id number]}  There were no vitals filed for this visit. There were no vitals filed for this visit.  GENERAL:alert, no distress and comfortable SKIN: skin color, texture, turgor are normal, no rashes or significant lesions EYES: normal, Conjunctiva are pink and non-injected, sclera clear OROPHARYNX:no exudate, no erythema and lips, buccal mucosa, and tongue normal  NECK: supple, thyroid normal size, non-tender, without nodularity LYMPH:  no palpable lymphadenopathy in the cervical, axillary or inguinal LUNGS: clear to auscultation and percussion with normal breathing effort HEART: regular rate & rhythm and no murmurs and no lower extremity edema ABDOMEN:abdomen soft, non-tender and normal bowel sounds Musculoskeletal:no cyanosis of digits and no clubbing  NEURO: alert & oriented x 3 with fluent speech, no focal motor/sensory deficits  LABORATORY DATA:  I have reviewed the data as listed CBC Latest Ref Rng & Units 01/08/2020 10/21/2018 04/16/2018  WBC 4.0 - 10.5 K/uL 9.1 12.6(H) 12.1(H)  Hemoglobin 12.0 - 15.0 g/dL 13.5 13.4 15.0  Hematocrit 36.0 - 46.0 % 41.5 41.7 45.0  Platelets 150 - 400 K/uL 373 346 -     CMP Latest Ref Rng & Units 02/08/2021 08/16/2020 06/28/2020  Glucose 70 - 99 mg/dL 183(H) 209(H) 238(H)  BUN 8 - 27 mg/dL 19 23 12   Creatinine 0.57 - 1.00 mg/dL 0.92 1.31(H) 0.71  Sodium 134 - 144 mmol/L 142 137 139  Potassium 3.5 - 5.2 mmol/L 4.2 3.5 4.1  Chloride 96 - 106 mmol/L 102 98 102  CO2 20 - 29 mmol/L 24 24 21   Calcium 8.7 - 10.3 mg/dL 9.7 10.0 9.6  Total Protein 6.0 - 8.5 g/dL 8.1 8.8(H) 7.3  Total Bilirubin 0.0 - 1.2 mg/dL 0.3 0.8 0.3  Alkaline Phos 44 - 121 IU/L 114 109 118  AST 0 - 40 IU/L 14 23 17   ALT 0 - 32 IU/L 9 17 17       RADIOGRAPHIC STUDIES: I have personally reviewed the radiological images as listed and agreed with the findings in the report. No results found.   ASSESSMENT & PLAN:  No  problem-specific Assessment & Plan notes found for this encounter.   No orders of the defined types were placed in this encounter.  All questions were answered. The patient knows to call the clinic with any problems, questions or concerns. No barriers to learning was detected. I spent {CHL ONC TIME VISIT - VHQIO:[id number]} counseling the patient face to face. The total time spent in the appointment was {CHL ONC TIME VISIT - KGMWN:[id number]} and more than 50% was on counseling and review of test results     Monica Feeling, Monica Clark 02/21/21

## 2021-02-24 ENCOUNTER — Inpatient Hospital Stay: Payer: BC Managed Care – PPO | Attending: Nurse Practitioner | Admitting: Nurse Practitioner

## 2021-02-24 ENCOUNTER — Inpatient Hospital Stay: Payer: BC Managed Care – PPO

## 2021-02-25 ENCOUNTER — Telehealth: Payer: Self-pay | Admitting: Nurse Practitioner

## 2021-02-25 NOTE — Telephone Encounter (Signed)
Sch per 1/5 inbasket, left msg, offered pt sch appt. Told pt if they are not interested to call back.

## 2021-03-07 ENCOUNTER — Other Ambulatory Visit (INDEPENDENT_AMBULATORY_CARE_PROVIDER_SITE_OTHER): Payer: Self-pay | Admitting: Family Medicine

## 2021-03-07 DIAGNOSIS — E1165 Type 2 diabetes mellitus with hyperglycemia: Secondary | ICD-10-CM

## 2021-03-10 ENCOUNTER — Ambulatory Visit (INDEPENDENT_AMBULATORY_CARE_PROVIDER_SITE_OTHER): Payer: BC Managed Care – PPO | Admitting: Family Medicine

## 2021-03-10 ENCOUNTER — Encounter (INDEPENDENT_AMBULATORY_CARE_PROVIDER_SITE_OTHER): Payer: Self-pay | Admitting: Family Medicine

## 2021-03-10 ENCOUNTER — Other Ambulatory Visit: Payer: Self-pay

## 2021-03-10 VITALS — BP 122/69 | HR 83 | Temp 98.0°F | Ht 63.0 in | Wt 212.0 lb

## 2021-03-10 DIAGNOSIS — E1169 Type 2 diabetes mellitus with other specified complication: Secondary | ICD-10-CM

## 2021-03-10 DIAGNOSIS — Z794 Long term (current) use of insulin: Secondary | ICD-10-CM

## 2021-03-10 DIAGNOSIS — I10 Essential (primary) hypertension: Secondary | ICD-10-CM

## 2021-03-10 DIAGNOSIS — Z6837 Body mass index (BMI) 37.0-37.9, adult: Secondary | ICD-10-CM | POA: Diagnosis not present

## 2021-03-10 MED ORDER — SEMAGLUTIDE (2 MG/DOSE) 8 MG/3ML ~~LOC~~ SOPN: 2.0000 mg | PEN_INJECTOR | SUBCUTANEOUS | 2 refills | Status: DC

## 2021-03-14 ENCOUNTER — Ambulatory Visit: Payer: BC Managed Care – PPO | Admitting: Family Medicine

## 2021-03-14 LAB — HM DIABETES EYE EXAM

## 2021-03-14 NOTE — Progress Notes (Signed)
Chief Complaint:   OBESITY Monica Clark is here to discuss her progress with her obesity treatment plan along with follow-up of her obesity related diagnoses. Monica Clark is on keeping a food journal and adhering to recommended goals of 1400 calories and 80+ grams of protein daily and states she is following her eating plan approximately 75-80% of the time. Monica Clark states she is doing 0 minutes 0 times per week.  Today's visit was #: 30 Starting weight: 216 lbs Starting date: 04/16/2018 Today's weight: 212 lbs Today's date: 03/10/2021 Total lbs lost to date: 4 Total lbs lost since last in-office visit: 0  Interim History: Monica Clark has done well with avoiding holiday weight gain. She is geeting back to the gym, but she has to be careful due to her planter fasciitis flares up.  Subjective:   1. Type 2 diabetes mellitus with other specified complication, with long-term current use of insulin (HCC) Monica Clark is stable on Ozempic, and she notes decreased polyphagia but she has had some challenges with getting her medicine due to drug shortages.  2. Essential hypertension Monica Clark's blood pressure is stable on her medications, and she has no signs of hypotension.  Assessment/Plan:   1. Type 2 diabetes mellitus with other specified complication, with long-term current use of insulin (HCC) Monica Clark will continue Ozempic, and we will refill for 90 days with no refills. Good blood sugar control is important to decrease the likelihood of diabetic complications such as nephropathy, neuropathy, limb loss, blindness, coronary artery disease, and death. Intensive lifestyle modification including diet, exercise and weight loss are the first line of treatment for diabetes.   - Semaglutide, 2 MG/DOSE, 8 MG/3ML SOPN; Inject 2 mg as directed once a week.  Dispense: 3 mL; Refill: 2  2. Essential hypertension Monica Clark will continue with diet and exercise to improve blood pressure control. She will watch for signs of low blood  pressure, and continue to hydrate.   3. Obesity with current BMI of 37.6 Monica Clark is currently in the action stage of change. As such, her goal is to continue with weight loss efforts. She has agreed to the Category 2 Plan.   Exercise goals: All adults should avoid inactivity. Some physical activity is better than none, and adults who participate in any amount of physical activity gain some health benefits.  Behavioral modification strategies: increasing lean protein intake, increasing water intake, and meal planning and cooking strategies.  Monica Clark has agreed to follow-up with our clinic in 3 weeks. She was informed of the importance of frequent follow-up visits to maximize her success with intensive lifestyle modifications for her multiple health conditions.   Objective:   Blood pressure 122/69, pulse 83, temperature 98 F (36.7 C), height 5\' 3"  (1.6 m), weight 212 lb (96.2 kg), SpO2 96 %. Body mass index is 37.55 kg/m.  General: Cooperative, alert, well developed, in no acute distress. HEENT: Conjunctivae and lids unremarkable. Cardiovascular: Regular rhythm.  Lungs: Normal work of breathing. Neurologic: No focal deficits.   Lab Results  Component Value Date   CREATININE 0.92 02/08/2021   BUN 19 02/08/2021   NA 142 02/08/2021   K 4.2 02/08/2021   CL 102 02/08/2021   CO2 24 02/08/2021   Lab Results  Component Value Date   ALT 9 02/08/2021   AST 14 02/08/2021   ALKPHOS 114 02/08/2021   BILITOT 0.3 02/08/2021   Lab Results  Component Value Date   HGBA1C 9.0 (H) 02/08/2021   HGBA1C 11.0 (H) 06/28/2020  HGBA1C 6.4 (A) 08/05/2019   HGBA1C 7.5 (H) 12/05/2018   HGBA1C 7.8 (H) 09/13/2018   Lab Results  Component Value Date   INSULIN 27.9 (H) 06/28/2020   INSULIN 8.7 04/16/2018   Lab Results  Component Value Date   TSH 2.220 04/16/2018   Lab Results  Component Value Date   CHOL 194 02/08/2021   HDL 40 02/08/2021   LDLCALC 121 (H) 02/08/2021   LDLDIRECT 89.0  08/16/2020   TRIG 189 (H) 02/08/2021   CHOLHDL 5 08/16/2020   Lab Results  Component Value Date   VD25OH 24.7 (L) 02/08/2021   VD25OH 12.6 (L) 06/28/2020   VD25OH 30.7 12/05/2018   Lab Results  Component Value Date   WBC 9.1 01/08/2020   HGB 13.5 01/08/2020   HCT 41.5 01/08/2020   MCV 92.2 01/08/2020   PLT 373 01/08/2020   No results found for: IRON, TIBC, FERRITIN  Attestation Statements:   Reviewed by clinician on day of visit: allergies, medications, problem list, medical history, surgical history, family history, social history, and previous encounter notes.   I, Trixie Dredge, am acting as transcriptionist for Dennard Nip, MD.  I have reviewed the above documentation for accuracy and completeness, and I agree with the above. -  Dennard Nip, MD

## 2021-03-16 ENCOUNTER — Telehealth: Payer: Self-pay | Admitting: Hematology

## 2021-03-16 NOTE — Telephone Encounter (Signed)
Sch per 1.24 inbasket, left msg °

## 2021-03-22 ENCOUNTER — Inpatient Hospital Stay: Payer: BC Managed Care – PPO | Admitting: Nurse Practitioner

## 2021-03-22 NOTE — Progress Notes (Deleted)
[facility]   [phone] [phone]) [phone]   Clinic Follow up Note   Patient Care Team: [name], MD as PCP - General (Family Medicine) Everitt Amber, MD as Consulting Physician (Obstetrics and Gynecology) 03/22/2021  CHIEF COMPLAINT: Follow up L breast ADH  SUMMARY OF ONCOLOGIC HISTORY: Oncology History   No history exists.    CURRENT THERAPY: Tamoxifen 11/30/2014 - 12/21/2019  INTERVAL HISTORY: Monica Clark returns for follow up as scheduled. Last seen by Dr. Burr Medico 01/08/20.    REVIEW OF SYSTEMS:   Constitutional: Denies fevers, chills or abnormal weight loss Eyes: Denies blurriness of vision Ears, nose, mouth, throat, and face: Denies mucositis or sore throat Respiratory: Denies cough, dyspnea or wheezes Cardiovascular: Denies palpitation, chest discomfort or lower extremity swelling Gastrointestinal:  Denies nausea, heartburn or change in bowel habits Skin: Denies abnormal skin rashes Lymphatics: Denies new lymphadenopathy or easy bruising Neurological:Denies numbness, tingling or new weaknesses Behavioral/Psych: Mood is stable, no new changes  All other systems were reviewed with the patient and are negative.  MEDICAL HISTORY:  Past Medical History:  Diagnosis Date   Breast mass, left    duct mass   Depression    Endometrial hyperplasia    Fuchs' corneal dystrophy    Gallbladder problem    Heart murmur    states no known problems, has never seen a cardiologist   Hypertension    states under control with med., has been on med. x 5 yr.   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[place])    Nipple discharge 04/2014   left breast   Non-insulin dependent type 2 diabetes mellitus ([place])     SURGICAL HISTORY: Past Surgical History:  Procedure Laterality Date   BREAST BIOPSY Left 04/30/2014   Procedure: LEFT BREAST BIOPSY;  Surgeon: Jackolyn Confer, MD;  Location: [facility];  Service:  General;  Laterality: Left;   BREAST DUCTAL SYSTEM EXCISION Left 04/30/2014   Procedure: LEFT NIPPLE DUCT EXCISION;  Surgeon: Jackolyn Confer, MD;  Location: [facility];  Service: General;  Laterality: Left;   [place]   ROBOTIC ASSISTED TOTAL HYSTERECTOMY WITH BILATERAL SALPINGO OOPHERECTOMY Bilateral 10/13/2014   Procedure: ROBOTIC ASSISTED TOTAL HYSTERECTOMY WITH BILATERAL SALPINGO OOPHORECTOMY ;  Surgeon: Everitt Amber, MD;  Location: [facility];  Service: Gynecology;  Laterality: Bilateral;    I have reviewed the social history and family history with the patient and they are unchanged from previous note.  ALLERGIES:  is allergic to codeine.  MEDICATIONS:  Current Outpatient Medications  Medication Sig Dispense Refill   diclofenac (VOLTAREN) 75 MG EC tablet Take 1 tablet (75 mg total) by mouth 2 (two) times daily. 60 tablet 1   glucose blood (ONETOUCH ULTRA) test strip TEST TWO TIMES A DAY 100 strip 0   Insulin Pen Needle 31G X 5 MM MISC Use once a day 100 each 1   JARDIANCE 25 MG TABS tablet TAKE 1 TABLET BY MOUTH EVERY DAY 30 tablet 0   metFORMIN (GLUCOPHAGE-XR) 500 MG 24 hr tablet Take 2 tablets (1,000 mg total) by mouth daily with supper. 180 tablet 3   rosuvastatin (CRESTOR) 5 MG tablet TAKE 1 TABLET BY MOUTH EVERY DAY 90 tablet 1   Semaglutide, 2 MG/DOSE, 8 MG/3ML SOPN Inject 2 mg as directed once a week. 3 mL 2   TRESIBA FLEXTOUCH 100 UNIT/ML FlexTouch Pen START WITH 20 UNITS  EVERY MORNING AND ADJUST AS DIRECTED. MAX 40 UNITS DAILY REPLACING SOLIQUA 15 mL 3   triamterene-hydrochlorothiazide (MAXZIDE-25) 37.5-25 MG tablet Take 1 tablet by mouth every morning. Reported on 09/03/2015 30 tablet 0   Vitamin D, Ergocalciferol, (DRISDOL) 1.25 MG (50000 UNIT) CAPS capsule Take 1 capsule (50,000 Units total) by mouth every 7 (seven) days. 4 capsule 0   No current facility-administered medications for this visit.    PHYSICAL  EXAMINATION: ECOG PERFORMANCE STATUS: {CHL ONC ECOG PS:[id number]}  There were no vitals filed for this visit. There were no vitals filed for this visit.  GENERAL:alert, no distress and comfortable SKIN: skin color, texture, turgor are normal, no rashes or significant lesions EYES: normal, Conjunctiva are pink and non-injected, sclera clear OROPHARYNX:no exudate, no erythema and lips, buccal mucosa, and tongue normal  NECK: supple, thyroid normal size, non-tender, without nodularity LYMPH:  no palpable lymphadenopathy in the cervical, axillary or inguinal LUNGS: clear to auscultation and percussion with normal breathing effort HEART: regular rate & rhythm and no murmurs and no lower extremity edema ABDOMEN:abdomen soft, non-tender and normal bowel sounds Musculoskeletal:no cyanosis of digits and no clubbing  NEURO: alert & oriented x 3 with fluent speech, no focal motor/sensory deficits  LABORATORY DATA:  I have reviewed the data as listed CBC Latest Ref Rng & Units 01/08/2020 10/21/2018 04/16/2018  WBC 4.0 - 10.5 K/uL 9.1 12.6(H) 12.1(H)  Hemoglobin 12.0 - 15.0 g/dL 13.5 13.4 15.0  Hematocrit 36.0 - 46.0 % 41.5 41.7 45.0  Platelets 150 - 400 K/uL 373 346 -     CMP Latest Ref Rng & Units 02/08/2021 08/16/2020 06/28/2020  Glucose 70 - 99 mg/dL 183(H) 209(H) 238(H)  BUN 8 - 27 mg/dL 19 23 12   Creatinine 0.57 - 1.00 mg/dL 0.92 1.31(H) 0.71  Sodium 134 - 144 mmol/L 142 137 139  Potassium 3.5 - 5.2 mmol/L 4.2 3.5 4.1  Chloride 96 - 106 mmol/L 102 98 102  CO2 20 - 29 mmol/L 24 24 21   Calcium 8.7 - 10.3 mg/dL 9.7 10.0 9.6  Total Protein 6.0 - 8.5 g/dL 8.1 8.8(H) 7.3  Total Bilirubin 0.0 - 1.2 mg/dL 0.3 0.8 0.3  Alkaline Phos 44 - 121 IU/L 114 109 118  AST 0 - 40 IU/L 14 23 17   ALT 0 - 32 IU/L 9 17 17       RADIOGRAPHIC STUDIES: I have personally reviewed the radiological images as listed and agreed with the findings in the report. No results found.   ASSESSMENT & PLAN:  No  problem-specific Assessment & Plan notes found for this encounter.   No orders of the defined types were placed in this encounter.  All questions were answered. The patient knows to call the clinic with any problems, questions or concerns. No barriers to learning was detected. I spent {CHL ONC TIME VISIT - GEZMO:[id number]} counseling the patient face to face. The total time spent in the appointment was {CHL ONC TIME VISIT - PTWSF:[id number]} and more than 50% was on counseling and review of test results     Monica Feeling, Monica Clark 03/22/21

## 2021-04-06 ENCOUNTER — Other Ambulatory Visit: Payer: Self-pay

## 2021-04-06 ENCOUNTER — Encounter (INDEPENDENT_AMBULATORY_CARE_PROVIDER_SITE_OTHER): Payer: Self-pay | Admitting: Family Medicine

## 2021-04-06 ENCOUNTER — Ambulatory Visit (INDEPENDENT_AMBULATORY_CARE_PROVIDER_SITE_OTHER): Payer: BC Managed Care – PPO | Admitting: Family Medicine

## 2021-04-06 VITALS — BP 120/74 | HR 69 | Ht 63.0 in | Wt 210.0 lb

## 2021-04-06 DIAGNOSIS — Z6838 Body mass index (BMI) 38.0-38.9, adult: Secondary | ICD-10-CM

## 2021-04-06 DIAGNOSIS — E1169 Type 2 diabetes mellitus with other specified complication: Secondary | ICD-10-CM

## 2021-04-06 DIAGNOSIS — Z7985 Long-term (current) use of injectable non-insulin antidiabetic drugs: Secondary | ICD-10-CM

## 2021-04-06 DIAGNOSIS — Z794 Long term (current) use of insulin: Secondary | ICD-10-CM

## 2021-04-06 DIAGNOSIS — E669 Obesity, unspecified: Secondary | ICD-10-CM

## 2021-04-06 DIAGNOSIS — Z6837 Body mass index (BMI) 37.0-37.9, adult: Secondary | ICD-10-CM

## 2021-04-07 ENCOUNTER — Inpatient Hospital Stay: Payer: BC Managed Care – PPO | Attending: Nurse Practitioner | Admitting: Hematology

## 2021-04-07 DIAGNOSIS — N6099 Unspecified benign mammary dysplasia of unspecified breast: Secondary | ICD-10-CM

## 2021-04-07 NOTE — Progress Notes (Signed)
Chief Complaint:   OBESITY Monica Clark is here to discuss her progress with her obesity treatment plan along with follow-up of her obesity related diagnoses. Monica Clark is on keeping a food journal and adhering to recommended goals of 1400 calories and 80+ grams of protein daily and states she is following her eating plan approximately 75-80% of the time. Monica Clark states she is riding the stationary bike for 30-35 minutes 2-3 times per week.  Today's visit was #: 46 Starting weight: 216 lbs Starting date: 04/16/2018 Today's weight: 210 lbs Today's date: 04/06/2021 Total lbs lost to date: 6 Total lbs lost since last in-office visit: 2  Interim History: Monica Clark continues to do well with weight loss. She is working on following her plan and she has started to increase her exercise. She feels well overall.  Subjective:   1. Type 2 diabetes mellitus with other specified complication, with long-term current use of insulin (HCC) Monica Clark continues to do well with weight loss. She has no problems with her medications or signs of hypoglycemia, or other side effects.  Assessment/Plan:   1. Type 2 diabetes mellitus with other specified complication, with long-term current use of insulin (HCC) Monica Clark will continue her medications, diet, and Ozempic. We discussed signs of hypoglycemia and glucose tablets. Handout was given today.  2. Obesity with current BMI of 37.3 Monica Clark is currently in the action stage of change. As such, her goal is to continue with weight loss efforts. She has agreed to keeping a food journal and adhering to recommended goals of 1400 calories and 80+ grams of protein daily.   Exercise goals: As is.  Behavioral modification strategies: meal planning and cooking strategies and emotional eating strategies.  Monica Clark has agreed to follow-up with our clinic in 3 weeks. She was informed of the importance of frequent follow-up visits to maximize her success with intensive lifestyle modifications for  her multiple health conditions.   Objective:   Blood pressure 120/74, pulse 69, height 5\' 3"  (1.6 m), weight 210 lb (95.3 kg), SpO2 98 %. Body mass index is 37.2 kg/m.  General: Cooperative, alert, well developed, in no acute distress. HEENT: Conjunctivae and lids unremarkable. Cardiovascular: Regular rhythm.  Lungs: Normal work of breathing. Neurologic: No focal deficits.   Lab Results  Component Value Date   CREATININE 0.92 02/08/2021   BUN 19 02/08/2021   NA 142 02/08/2021   K 4.2 02/08/2021   CL 102 02/08/2021   CO2 24 02/08/2021   Lab Results  Component Value Date   ALT 9 02/08/2021   AST 14 02/08/2021   ALKPHOS 114 02/08/2021   BILITOT 0.3 02/08/2021   Lab Results  Component Value Date   HGBA1C 9.0 (H) 02/08/2021   HGBA1C 11.0 (H) 06/28/2020   HGBA1C 6.4 (A) 08/05/2019   HGBA1C 7.5 (H) 12/05/2018   HGBA1C 7.8 (H) 09/13/2018   Lab Results  Component Value Date   INSULIN 27.9 (H) 06/28/2020   INSULIN 8.7 04/16/2018   Lab Results  Component Value Date   TSH 2.220 04/16/2018   Lab Results  Component Value Date   CHOL 194 02/08/2021   HDL 40 02/08/2021   LDLCALC 121 (H) 02/08/2021   LDLDIRECT 89.0 08/16/2020   TRIG 189 (H) 02/08/2021   CHOLHDL 5 08/16/2020   Lab Results  Component Value Date   VD25OH 24.7 (L) 02/08/2021   VD25OH 12.6 (L) 06/28/2020   VD25OH 30.7 12/05/2018   Lab Results  Component Value Date   WBC 9.1 01/08/2020  HGB 13.5 01/08/2020   HCT 41.5 01/08/2020   MCV 92.2 01/08/2020   PLT 373 01/08/2020   No results found for: IRON, TIBC, FERRITIN  Attestation Statements:   Reviewed by clinician on day of visit: allergies, medications, problem list, medical history, surgical history, family history, social history, and previous encounter notes.  Time spent on visit including pre-visit chart review and post-visit care and charting was 32 minutes.    I, Trixie Dredge, am acting as transcriptionist for Dennard Nip, MD.  I  have reviewed the above documentation for accuracy and completeness, and I agree with the above. -  Dennard Nip, MD

## 2021-04-27 ENCOUNTER — Ambulatory Visit (INDEPENDENT_AMBULATORY_CARE_PROVIDER_SITE_OTHER): Payer: BC Managed Care – PPO | Admitting: Family Medicine

## 2021-05-17 ENCOUNTER — Telehealth: Payer: Self-pay | Admitting: *Deleted

## 2021-05-17 ENCOUNTER — Other Ambulatory Visit: Payer: Self-pay | Admitting: Endocrinology

## 2021-05-17 DIAGNOSIS — E1165 Type 2 diabetes mellitus with hyperglycemia: Secondary | ICD-10-CM

## 2021-05-17 NOTE — Telephone Encounter (Signed)
Spoke to patient by telephone and was advised that she went for her screening mammogram today and they found some issues. Patient stated that she was told that she would need a diagnostic mammogram. Patient stated that she is not due to see Dr. Diona Browner as a new patient until 06/02/21. Patient was advised that typically our doctor would need to see her prior to ordering any test. Patient stated that she will reach out to her previously doctor's office and see if they will order the diagnostic mammogram for her. Patient was advised to call the office back and let Dr. Diona Browner know if they will order it for her. Patient stated that she will call back and let us know if she was able to get it ordered.  ?

## 2021-05-17 NOTE — Telephone Encounter (Signed)
FYI ?Spoke to patient and was advised that she has called [facility] which is the office that she has been going to and requested that they order a diagnostic mammogram. Patient stated that she will call back tomorrow and let our office know if she was able to get it an order for it.  ?

## 2021-05-17 NOTE — Telephone Encounter (Signed)
PLEASE NOTE: All timestamps contained within this report are represented as [place] Standard Time. ?CONFIDENTIALTY NOTICE: This fax transmission is intended only for the addressee. It contains information that is legally privileged, confidential or otherwise protected from ?use or disclosure. If you are not the intended recipient, you are strictly prohibited from reviewing, disclosing, copying using or disseminating any of this information or taking any ?action in reliance on or regarding this information. If you have received this fax in error, please notify us immediately by telephone so that we can arrange for its return to [place]. ?Phone: [phone], Toll-Free: [phone], Fax: [phone] ?Page: 1 of 1 ?Call Id: [id number] ?[facility] - Client ?Nonclinical Telephone Record  ?[facility]? ?Client [facility] - Client ?Client Site [place] ?Provider Eliezer Lofts - MD ?Contact Type Call ?Who Is Calling Patient / Member / Family / Caregiver ?Caller Name Dene Landsberg ?Caller Phone Number [phone] ?Patient Name Monica Clark ?Patient DOB Jan 08, 1956 ?Call Type Message Only Information Provided ?Reason for Call Request for General Office Information ?Initial Comment Caller states that she has not seen Dr. Diona Browner yet. She is scheduled on 06/02/2020. She is ?needing an order for a mammogram. ?Additional Comment Office hours provided. ?Disp. Time Disposition Final User ?05/17/2021 7:55:31 AM General Information Provided Yes Soyla Murphy ?Call Closed By: Soyla Murphy ?Transaction Date/Time: 05/17/2021 7:51:11 AM (ET) ?

## 2021-05-18 ENCOUNTER — Encounter (INDEPENDENT_AMBULATORY_CARE_PROVIDER_SITE_OTHER): Payer: Self-pay | Admitting: Family Medicine

## 2021-05-18 ENCOUNTER — Ambulatory Visit (INDEPENDENT_AMBULATORY_CARE_PROVIDER_SITE_OTHER): Payer: BC Managed Care – PPO | Admitting: Family Medicine

## 2021-05-18 VITALS — BP 139/84 | HR 76 | Temp 98.7°F | Ht 63.0 in | Wt 215.0 lb

## 2021-05-18 DIAGNOSIS — E669 Obesity, unspecified: Secondary | ICD-10-CM | POA: Diagnosis not present

## 2021-05-18 DIAGNOSIS — Z794 Long term (current) use of insulin: Secondary | ICD-10-CM

## 2021-05-18 DIAGNOSIS — Z6838 Body mass index (BMI) 38.0-38.9, adult: Secondary | ICD-10-CM | POA: Diagnosis not present

## 2021-05-18 DIAGNOSIS — E1169 Type 2 diabetes mellitus with other specified complication: Secondary | ICD-10-CM | POA: Diagnosis not present

## 2021-05-18 DIAGNOSIS — Z7985 Long-term (current) use of injectable non-insulin antidiabetic drugs: Secondary | ICD-10-CM | POA: Diagnosis not present

## 2021-05-18 MED ORDER — BLOOD GLUCOSE METER KIT: PACK | 0 refills | Status: DC

## 2021-05-18 MED ORDER — EMPAGLIFLOZIN 25 MG PO TABS: 25.0000 mg | ORAL_TABLET | Freq: Every day | ORAL | 0 refills | Status: DC

## 2021-05-18 MED ORDER — SEMAGLUTIDE (2 MG/DOSE) 8 MG/3ML ~~LOC~~ SOPN: 2.0000 mg | PEN_INJECTOR | SUBCUTANEOUS | 2 refills | Status: DC

## 2021-05-20 ENCOUNTER — Ambulatory Visit: Payer: BC Managed Care – PPO | Admitting: Family Medicine

## 2021-05-23 NOTE — Progress Notes (Signed)
? ? ? ?Chief Complaint:  ? ?OBESITY ?Monica Clark is here to discuss her progress with her obesity treatment plan along with follow-up of her obesity related diagnoses. Monica Clark is on keeping a food journal and adhering to recommended goals of 1400 calories and 80+ grams of protein daily and states she is following her eating plan approximately 70% of the time. Monica Clark states she is at the gym for 20 minutes 2 times per week. ? ?Today's visit was #: 64 ?Starting weight: 216 lbs ?Starting date: 04/16/2018 ?Today's weight: 215 lbs ?Today's date: 05/18/2021 ?Total lbs lost to date: 1 ?Total lbs lost since last in-office visit: 0 ? ?Interim History: Monica Clark continues to work on her diet, but her hunger has increased recently. She has some social situations coming up which may be challenging to stay on track.  ? ?Subjective:  ? ?1. Type 2 diabetes mellitus with other specified complication, with long-term current use of insulin ([place]) ?Monica Clark is on Ozempic but she has been out for 2 weeks due to pharmacy shortages. She would like to send a script to a different pharmacy.  ? ?Assessment/Plan:  ? ?1. Type 2 diabetes mellitus with other specified complication, with long-term current use of insulin ([place]) ?Monica Clark agreed to restart Ozempic 2 mg with a 90 day supply, and glucose meter kit with a 90 day supply; and we will refill Jardiance for 1 month. All scripts were sent to [facility] at [facility]. ?  ?- empagliflozin (JARDIANCE) 25 MG TABS tablet; Take 1 tablet (25 mg total) by mouth daily.  Dispense: 30 tablet; Refill: 0 ?- Semaglutide, 2 MG/DOSE, 8 MG/3ML SOPN; Inject 2 mg as directed once a week.  Dispense: 3 mL; Refill: 2 ?- blood glucose meter kit and supplies; Dispense based on patient and insurance preference. Use up to four times daily as directed. (FOR ICD-10 E10.9, E11.9).  Dispense: 1 each; Refill: 0 ? ?2. Obesity with current BMI of 38.2 ?Monica Clark is currently in the action stage of change. As such, her goal is to continue with weight loss  efforts. She has agreed to keeping a food journal and adhering to recommended goals of 1400 calories and 80+ grams of protein daily.  ? ?Exercise goals: As is. ? ?Behavioral modification strategies: increasing lean protein intake, travel eating strategies, and planning for success. ? ?Monica Clark has agreed to follow-up with our clinic in 4 weeks. She was informed of the importance of frequent follow-up visits to maximize her success with intensive lifestyle modifications for her multiple health conditions.  ? ?Objective:  ? ?Blood pressure 139/84, pulse 76, temperature 98.7 ?F (37.1 ?C), height _0  (1.6 m), weight 215 lb (97.5 kg), SpO2 99 %. ?Body mass index is 38.09 kg/m?. ? ?General: Cooperative, alert, well developed, in no acute distress. ?HEENT: Conjunctivae and lids unremarkable. ?Cardiovascular: Regular rhythm.  ?Lungs: Normal work of breathing. ?Neurologic: No focal deficits.  ? ?Lab Results  ?Component Value Date  ? CREATININE 0.92 02/08/2021  ? BUN 19 02/08/2021  ? NA 142 02/08/2021  ? K 4.2 02/08/2021  ? CL 102 02/08/2021  ? CO2 24 02/08/2021  ? ?Lab Results  ?Component Value Date  ? ALT 9 02/08/2021  ? AST 14 02/08/2021  ? ALKPHOS 114 02/08/2021  ? BILITOT 0.3 02/08/2021  ? ?Lab Results  ?Component Value Date  ? HGBA1C 9.0 (H) 02/08/2021  ? HGBA1C 11.0 (H) 06/28/2020  ? HGBA1C 6.4 (A) 08/05/2019  ? HGBA1C 7.5 (H) 12/05/2018  ? HGBA1C 7.8 (H) 09/13/2018  ? ?Lab  Results  ?Component Value Date  ? INSULIN 27.9 (H) 06/28/2020  ? INSULIN 8.7 04/16/2018  ? ?Lab Results  ?Component Value Date  ? TSH 2.220 04/16/2018  ? ?Lab Results  ?Component Value Date  ? CHOL 194 02/08/2021  ? HDL 40 02/08/2021  ? [place] 121 (H) 02/08/2021  ? LDLDIRECT 89.0 08/16/2020  ? TRIG 189 (H) 02/08/2021  ? CHOLHDL 5 08/16/2020  ? ?Lab Results  ?Component Value Date  ? VD25OH 24.7 (L) 02/08/2021  ? VD25OH 12.6 (L) 06/28/2020  ? VD25OH 30.7 12/05/2018  ? ?Lab Results  ?Component Value Date  ? WBC 9.1 01/08/2020  ? HGB 13.5 01/08/2020  ?  HCT 41.5 01/08/2020  ? MCV 92.2 01/08/2020  ? PLT 373 01/08/2020  ? ?No results found for: IRON, TIBC, FERRITIN ? ?Attestation Statements:  ? ?Reviewed by clinician on day of visit: allergies, medications, problem list, medical history, surgical history, family history, social history, and previous encounter notes. ? ?Time spent on visit including pre-visit chart review and post-visit care and charting was 30 minutes.  ? ? ?I, Trixie Dredge, am acting as transcriptionist for Dennard Nip, MD. ? ?I have reviewed the above documentation for accuracy and completeness, and I agree with the above. -  Dennard Nip, MD ? ? ?

## 2021-05-24 ENCOUNTER — Encounter: Payer: Self-pay | Admitting: Family Medicine

## 2021-05-24 LAB — HM MAMMOGRAPHY

## 2021-05-30 NOTE — Telephone Encounter (Signed)
Spoke to patient by telephone and was advised that she was able to get the order for the diagnostic mammogram. ?

## 2021-06-02 ENCOUNTER — Encounter: Payer: Self-pay | Admitting: Family Medicine

## 2021-06-02 ENCOUNTER — Ambulatory Visit: Payer: BC Managed Care – PPO | Admitting: Family Medicine

## 2021-06-02 VITALS — BP 112/74 | HR 82 | Temp 99.1°F | Ht 63.0 in | Wt 208.5 lb

## 2021-06-02 DIAGNOSIS — Z8542 Personal history of malignant neoplasm of other parts of uterus: Secondary | ICD-10-CM | POA: Diagnosis not present

## 2021-06-02 DIAGNOSIS — N6452 Nipple discharge: Secondary | ICD-10-CM

## 2021-06-02 DIAGNOSIS — M7661 Achilles tendinitis, right leg: Secondary | ICD-10-CM

## 2021-06-02 DIAGNOSIS — E1169 Type 2 diabetes mellitus with other specified complication: Secondary | ICD-10-CM

## 2021-06-02 DIAGNOSIS — E785 Hyperlipidemia, unspecified: Secondary | ICD-10-CM

## 2021-06-02 DIAGNOSIS — I152 Hypertension secondary to endocrine disorders: Secondary | ICD-10-CM

## 2021-06-02 DIAGNOSIS — E559 Vitamin D deficiency, unspecified: Secondary | ICD-10-CM

## 2021-06-02 DIAGNOSIS — E1165 Type 2 diabetes mellitus with hyperglycemia: Secondary | ICD-10-CM | POA: Diagnosis not present

## 2021-06-02 DIAGNOSIS — E1159 Type 2 diabetes mellitus with other circulatory complications: Secondary | ICD-10-CM | POA: Diagnosis not present

## 2021-06-02 DIAGNOSIS — E66812 Obesity, class 2: Secondary | ICD-10-CM

## 2021-06-02 DIAGNOSIS — E7849 Other hyperlipidemia: Secondary | ICD-10-CM | POA: Insufficient documentation

## 2021-06-02 DIAGNOSIS — M7662 Achilles tendinitis, left leg: Secondary | ICD-10-CM

## 2021-06-02 DIAGNOSIS — Z6838 Body mass index (BMI) 38.0-38.9, adult: Secondary | ICD-10-CM

## 2021-06-02 DIAGNOSIS — N6099 Unspecified benign mammary dysplasia of unspecified breast: Secondary | ICD-10-CM

## 2021-06-02 NOTE — Assessment & Plan Note (Signed)
She is followed at [facility].. followed by Dr. Leafy Ro ?

## 2021-06-02 NOTE — Patient Instructions (Addendum)
Return for fasting labs! ? Keep working on healthy eating, low carb diet and regular exercise. ?

## 2021-06-02 NOTE — Progress Notes (Signed)
? ? Patient ID: Monica Clark, female    DOB: 1956/01/05, 66 y.o.   MRN: [id number] ? ?This visit was conducted in person. ? ?BP 112/74   Pulse 82   Temp 99.1 ?F (37.3 ?C) (Oral)   Ht $R'5\' 3"'Dx$  (1.6 m)   Wt 208 lb 8 oz (94.6 kg)   SpO2 98%   BMI 36.93 kg/m?   ? ?CC:  ?Chief Complaint  ?Patient presents with  ? Establish Care  ? ? ?Subjective:  ? ?HPI: ?Monica Clark is a 66 y.o. female presenting on 06/02/2021 for Establish Care ? ? Previous PCP: Dr. Justin Mend.. left the practice. ? Last CPX:  > 1 year ? GYN/OB: Dr. Nelda Marseille [facility] ?  ? ? Obesity:Body mass index is 36.93 kg/m?. ? ? She is currently seeing Dr. Leafy Ro at [facility] . ?Wt Readings from Last 3 Encounters:  ?06/02/21 208 lb 8 oz (94.6 kg)  ?05/18/21 215 lb (97.5 kg)  ?04/06/21 210 lb (95.3 kg)  ? ? ? DM  Has been on  [place], ozempic 2 mg weekly (hard to find it, hit and miss) jardiance 25 mg daily, on Metfomrin xr 500 mg 1 tablet daily... more causes diarrhea. ?Lab Results  ?Component Value Date  ? HGBA1C 9.0 (H) 02/08/2021  ?FBS 140-170, 2 hours post prandial  ?Endometrial cancer Followed by Dr. Denman George in past. ? S/P total hysterectomy 2016. ? No chemo, radiation.   ? Had ben having yearly pap smears given history. ? ?Exercise limited by ankle issues. ?   ?Hypertension:   Well controlled on HCTZ/triamterene ?BP Readings from Last 3 Encounters:  ?06/02/21 112/74  ?05/18/21 139/84  ?04/06/21 120/74  ?Using medication without problems or lightheadedness:  ?Chest pain with exertion: ?Edema: ?Short of breath: ?Average home BPs: ?Other issues: ? ? High cholesterol  On crestor 5 mg daily..  now taking more regularly. ?Lab Results  ?Component Value Date  ? CHOL 194 02/08/2021  ? HDL 40 02/08/2021  ? [place] 121 (H) 02/08/2021  ? LDLDIRECT 89.0 08/16/2020  ? TRIG 189 (H) 02/08/2021  ? CHOLHDL 5 08/16/2020  ? ? ? ?Relevant past medical, surgical, family and social history reviewed and updated as indicated. Interim medical history since our last  visit reviewed. ?Allergies and medications reviewed and updated. ?Outpatient Medications Prior to Visit  ?Medication Sig Dispense Refill  ? empagliflozin (JARDIANCE) 25 MG TABS tablet Take 1 tablet (25 mg total) by mouth daily. 30 tablet 0  ? glucose blood (ONETOUCH VERIO) test strip Use to check blood sugar 2 times a day    ? Insulin Pen Needle 31G X 5 MM MISC Use once a day 100 each 1  ? metFORMIN (GLUCOPHAGE-XR) 500 MG 24 hr tablet Take 2 tablets (1,000 mg total) by mouth daily with supper. 180 tablet 3  ? rosuvastatin (CRESTOR) 5 MG tablet TAKE 1 TABLET BY MOUTH EVERY DAY 90 tablet 1  ? Semaglutide, 2 MG/DOSE, 8 MG/3ML SOPN Inject 2 mg as directed once a week. 3 mL 2  ? TRESIBA FLEXTOUCH 100 UNIT/ML FlexTouch Pen START WITH 20 UNITS EVERY MORNING AND ADJUST AS DIRECTED. MAX 40 UNITS DAILY REPLACING SOLIQUA 15 mL 3  ? triamterene-hydrochlorothiazide (MAXZIDE-25) 37.5-25 MG tablet Take 1 tablet by mouth every morning. Reported on 09/03/2015 30 tablet 0  ? blood glucose meter kit and supplies Dispense based on patient and insurance preference. Use up to four times daily as directed. (FOR ICD-10 E10.9, E11.9). 1 each 0  ?  glucose blood (ONETOUCH ULTRA) test strip TEST TWO TIMES A DAY 100 strip 0  ? Vitamin D, Ergocalciferol, (DRISDOL) 1.25 MG (50000 UNIT) CAPS capsule Take 1 capsule (50,000 Units total) by mouth every 7 (seven) days. (Patient not taking: Reported on 06/02/2021) 4 capsule 0  ? diclofenac (VOLTAREN) 75 MG EC tablet Take 1 tablet (75 mg total) by mouth 2 (two) times daily. 60 tablet 1  ? ?No facility-administered medications prior to visit.  ?  ? ?Per HPI unless specifically indicated in ROS section below ?Review of Systems  ?Constitutional:  Positive for fatigue.  ?Musculoskeletal:  Positive for back pain.  ?Objective:  ?BP 112/74   Pulse 82   Temp 99.1 ?F (37.3 ?C) (Oral)   Ht $R'5\' 3"'KG$  (1.6 m)   Wt 208 lb 8 oz (94.6 kg)   SpO2 98%   BMI 36.93 kg/m?   ?Wt Readings from Last 3 Encounters:  ?06/02/21  208 lb 8 oz (94.6 kg)  ?05/18/21 215 lb (97.5 kg)  ?04/06/21 210 lb (95.3 kg)  ?  ?  ?Physical Exam ?Vitals and nursing note reviewed.  ?Constitutional:   ?   General: She is not in acute distress. ?   Appearance: Normal appearance. She is well-developed. She is obese. She is not ill-appearing or toxic-appearing.  ?HENT:  ?   Head: Normocephalic.  ?   Right Ear: Hearing, tympanic membrane, ear canal and external ear normal.  ?   Left Ear: Hearing, tympanic membrane, ear canal and external ear normal.  ?   Nose: Nose normal.  ?Eyes:  ?   General: Lids are normal. Lids are everted, no foreign bodies appreciated.  ?   Conjunctiva/sclera: Conjunctivae normal.  ?   Pupils: Pupils are equal, round, and reactive to light.  ?Neck:  ?   Thyroid: No thyroid mass or thyromegaly.  ?   Vascular: No carotid bruit.  ?   Trachea: Trachea normal.  ?Cardiovascular:  ?   Rate and Rhythm: Normal rate and regular rhythm.  ?   Heart sounds: Normal heart sounds, S1 normal and S2 normal. No murmur heard. ?  No gallop.  ?Pulmonary:  ?   Effort: Pulmonary effort is normal. No respiratory distress.  ?   Breath sounds: Normal breath sounds. No wheezing, rhonchi or rales.  ?Abdominal:  ?   General: Bowel sounds are normal. There is no distension or abdominal bruit.  ?   Palpations: Abdomen is soft. There is no fluid wave or mass.  ?   Tenderness: There is no abdominal tenderness. There is no guarding or rebound.  ?   Hernia: No hernia is present.  ?Musculoskeletal:  ?   Cervical back: Normal range of motion and neck supple.  ?Lymphadenopathy:  ?   Cervical: No cervical adenopathy.  ?Skin: ?   General: Skin is warm and dry.  ?   Findings: No rash.  ?Neurological:  ?   Mental Status: She is alert.  ?   Cranial Nerves: No cranial nerve deficit.  ?   Sensory: No sensory deficit.  ?Psychiatric:     ?   Mood and Affect: Mood is not anxious or depressed.     ?   Speech: Speech normal.     ?   Behavior: Behavior normal. Behavior is cooperative.     ?    Judgment: Judgment normal.  ? ?   ?Results for orders placed or performed in visit on 05/24/21  ?HM MAMMOGRAPHY  ?Result Value Ref Range  ?  HM Mammogram 0-4 Bi-Rad 0-4 Bi-Rad, Self Reported Normal  ? ? ?This visit occurred during the SARS-CoV-2 public health emergency.  Safety protocols were in place, including screening questions prior to the visit, additional usage of staff PPE, and extensive cleaning of exam room while observing appropriate contact time as indicated for disinfecting solutions.  ? ?COVID 19 screen:  No recent travel or known exposure to [place] ?The patient denies respiratory symptoms of COVID 19 at this time. ?The importance of social distancing was discussed today.  ? ?Assessment and Plan ? ?  ?Problem List Items Addressed This Visit   ? ? Achilles tendonitis, bilateral  ?   Chronic intermittent.. no surgery. ? Using anti-inflammatory prn. ? Has been seen at [facility].. treated with sling. ? ? ?  ?  ? Atypical ductal hyperplasia of breast  ?   On left breast. ? S/P surgery and  tamoxifem 5 year course.. completed 11/2020 ? ?  ?  ?  ? Class 2 severe obesity with serious comorbidity and body mass index (BMI) of 38.0 to 38.9 in adult [facility]) - Primary  ?   She is followed at [facility].. followed by Dr. Leafy Ro ?  ?  ? Relevant Medications  ? metFORMIN (GLUCOPHAGE-XR) 500 MG 24 hr tablet  ? History of endometrial cancer  ?   Need to get records from GYN.. Will need to determine best plan for following this issue. ? Is year pap smears negative? ?  ?  ? Hyperlipidemia associated with type 2 diabetes mellitus ([place])  ?  Due for reevaluation on Crestor 5 mg daily.  He is started taking this more regularly in the recent months ? ?  ?  ? Relevant Medications  ? metFORMIN (GLUCOPHAGE-XR) 500 MG 24 hr tablet  ? Hypertension associated with type 2 diabetes mellitus ([place])  ?  Stable, chronic.  Continue current medication. ? ? ?Maxide 37.5/25 mg daily ?  ?  ? Relevant Medications  ?  metFORMIN (GLUCOPHAGE-XR) 500 MG 24 hr tablet  ? Nipple discharge  ?   Now with new right sided nipple discharge over the last 3 weeks. ? Nml mammogram last week. ? Followed by [facility] Dr. Burr Medico ?  ?  ? Type 2 diabetes mellitus

## 2021-06-02 NOTE — Assessment & Plan Note (Signed)
Due for reevaluation 

## 2021-06-02 NOTE — Assessment & Plan Note (Signed)
Stable, chronic.  Continue current medication. ? ? ?Maxide 37.5/25 mg daily ?

## 2021-06-02 NOTE — Assessment & Plan Note (Signed)
Chronic intermittent.. no surgery. ? Using anti-inflammatory prn. ? Has been seen at [facility].. treated with sling. ? ? ?

## 2021-06-02 NOTE — Assessment & Plan Note (Addendum)
On left breast. ? S/P surgery and  tamoxifem 5 year course.. completed 11/2020 ? ?  ?

## 2021-06-02 NOTE — Assessment & Plan Note (Addendum)
Need to get records from GYN.. Will need to determine best plan for following this issue. ? Is year pap smears negative? ?

## 2021-06-02 NOTE — Assessment & Plan Note (Signed)
Due for reevaluation on Crestor 5 mg daily.  He is started taking this more regularly in the recent months ? ?

## 2021-06-02 NOTE — Assessment & Plan Note (Addendum)
Now with new right sided nipple discharge over the last 3 weeks. ? Nml mammogram last week. ? Followed by [facility] Dr. Burr Medico ?

## 2021-06-02 NOTE — Assessment & Plan Note (Signed)
Chronic, inadequate control ? ?We will recheck A1c, it is likely improved from last check given her improved fasting blood sugars. ?Reviewed low carbohydrate diet and ways to lower blood sugar with lifestyle changes. ?She will continue [place] 20 units daily ?Ozempic 2 mg weekly... This has been difficult for her to obtain and she has taken the it less than half the time in the last 3 months.  She is interested in possibly trying [facility] which may be easier for her to obtain. ?She can only tolerate metformin 500 mg 1 tablet daily given side effect of diarrhea. ?Jardiance 25 mg daily at max. ? ?She will follow-up within the next few weeks for further evaluation and recommendations. ?

## 2021-06-10 ENCOUNTER — Encounter: Payer: Self-pay | Admitting: Family Medicine

## 2021-06-14 ENCOUNTER — Inpatient Hospital Stay: Payer: BC Managed Care – PPO | Attending: Nurse Practitioner | Admitting: Hematology

## 2021-06-14 ENCOUNTER — Encounter: Payer: Self-pay | Admitting: Hematology

## 2021-06-14 ENCOUNTER — Other Ambulatory Visit: Payer: Self-pay

## 2021-06-14 VITALS — BP 142/84 | HR 69 | Temp 98.1°F | Resp 17 | Ht 63.0 in | Wt 208.3 lb

## 2021-06-14 DIAGNOSIS — N6099 Unspecified benign mammary dysplasia of unspecified breast: Secondary | ICD-10-CM

## 2021-06-14 DIAGNOSIS — N6092 Unspecified benign mammary dysplasia of left breast: Secondary | ICD-10-CM | POA: Insufficient documentation

## 2021-06-14 DIAGNOSIS — E119 Type 2 diabetes mellitus without complications: Secondary | ICD-10-CM | POA: Insufficient documentation

## 2021-06-14 DIAGNOSIS — N6452 Nipple discharge: Secondary | ICD-10-CM | POA: Insufficient documentation

## 2021-06-14 DIAGNOSIS — Z79899 Other long term (current) drug therapy: Secondary | ICD-10-CM | POA: Insufficient documentation

## 2021-06-14 DIAGNOSIS — Z8542 Personal history of malignant neoplasm of other parts of uterus: Secondary | ICD-10-CM | POA: Diagnosis not present

## 2021-06-14 DIAGNOSIS — I10 Essential (primary) hypertension: Secondary | ICD-10-CM | POA: Insufficient documentation

## 2021-06-14 NOTE — Progress Notes (Signed)
?[place]   ?[phone] [phone]   ?Clinic Follow up Note  ? ?Patient Care Team: ?Jinny Sanders, MD as PCP - General (Family Medicine) ?Everitt Amber, MD as Consulting Physician (Obstetrics and Gynecology) ? ?Date of Service:  06/14/2021 ? ?CHIEF COMPLAINT: f/u of left breast ADH, right nipple discharge  ? ?CURRENT THERAPY:  ?Surveillance ? ?ASSESSMENT & PLAN:  ?Monica Clark is a 66 y.o. post-menopausal female with  ? ?1. Left breast atypical lobular hyperplasia, recent right nipple discharge  ?-diagnosed 04/30/14 with ADH when she underwent left nipple duct excision and surgical biopsy.  ?-s/p 5 years of chemoprevention with tamoxifen 11/30/14 - 11/2019.  She overall tolerated well. ?-she presented for annual mammogram with right nipple discharge for a few month. Mammogram on 05/24/21 was suspicious but [place] was negative. ?-she is clinically doing well. During physical exam, I was unable to express any nipple discharge. Given her increased risk for breast cancer, she would like to proceed with breast MRI. I ordered for her today. ?-Continue annual screening mammogram.  She would like to follow-up with [place] annually ?  ?2. Stage I endometrial cancer, 2016  ?-s/p total hysterectomy with BSO on 10/13/14 by Dr. Denman George  ?-most recent CT AP on 01/12/20 at [facility] was negative. ?  ?3. HTN, DM ?-continue follow-up with PCP, now seeing Dr. Diona Browner. ?  ?  ?Plan ?-breast MRI to be done soon, will call her with result  ?-lab and f/u with NP Lacie in one year, continue breast cancer surveillance  ? ? ?No problem-specific Assessment & Plan notes found for this encounter. ? ? ?INTERVAL HISTORY:  ?Monica Clark is here for a follow up of AHD. She was last seen by me on 01/08/20. She presents to the clinic alone. ?She reports concern with her right breast. She notes she had nipple discharge that was only expressed with manipulation. ?  ?All other systems were reviewed with the patient and are  negative. ? ?MEDICAL HISTORY:  ?Past Medical History:  ?Diagnosis Date  ? Breast mass, left   ? duct mass  ? Depression   ? Endometrial hyperplasia   ? Fuchs' corneal dystrophy   ? Gallbladder problem   ? Heart murmur   ? states no known problems, has never seen a cardiologist  ? Hypertension   ? states under control with med., has been on med. x 5 yr.  ? Immature cataract   ? Lower extremity edema   ? 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 Nipple discharge 04/2014  ? left breast  ? Non-insulin dependent type 2 diabetes mellitus ([place])   ? ? ?SURGICAL HISTORY: ?Past Surgical History:  ?Procedure Laterality Date  ? BREAST BIOPSY Left 04/30/2014  ? Procedure: LEFT BREAST BIOPSY;  Surgeon: Jackolyn Confer, MD;  Location: [place];  Service: General;  Laterality: Left;  ? BREAST DUCTAL SYSTEM EXCISION Left 04/30/2014  ? Procedure: LEFT NIPPLE DUCT EXCISION;  Surgeon: Jackolyn Confer, MD;  Location: [place];  Service: General;  Laterality: Left;  ? [place]  ? CHOLECYSTECTOMY  1989  ? ROBOTIC ASSISTED TOTAL HYSTERECTOMY WITH BILATERAL SALPINGO OOPHERECTOMY Bilateral 10/13/2014  ? Procedure: ROBOTIC ASSISTED TOTAL HYSTERECTOMY WITH BILATERAL SALPINGO OOPHORECTOMY ;  Surgeon: Everitt Amber, MD;  Location: [facility];  Service: Gynecology;  Laterality: Bilateral;  ? ? ?I have reviewed the social history and family history with the patient and they are unchanged from previous note. ? ?ALLERGIES:  is allergic to codeine. ? ?MEDICATIONS:  ?Current Outpatient Medications  ?Medication Sig Dispense Refill  ? empagliflozin (JARDIANCE) 25 MG TABS tablet Take 1 tablet (25 mg total) by mouth daily. 30 tablet 0  ? glucose blood (ONETOUCH VERIO) test strip Use to check blood sugar 2 times a day    ? Insulin Pen Needle 31G X 5 MM MISC Use once a day 100 each 1  ? metFORMIN (GLUCOPHAGE-XR) 500 MG 24 hr tablet Take 500 mg by mouth daily with breakfast.    ? rosuvastatin (CRESTOR) 5 MG tablet TAKE  1 TABLET BY MOUTH EVERY DAY 90 tablet 1  ? Semaglutide, 2 MG/DOSE, 8 MG/3ML SOPN Inject 2 mg as directed once a week. 3 mL 2  ? TRESIBA FLEXTOUCH 100 UNIT/ML FlexTouch Pen START WITH 20 UNITS EVERY MORNING AND ADJUST AS DIRECTED. MAX 40 UNITS DAILY REPLACING SOLIQUA 15 mL 3  ? triamterene-hydrochlorothiazide (MAXZIDE-25) 37.5-25 MG tablet Take 1 tablet by mouth every morning. Reported on 09/03/2015 30 tablet 0  ? Vitamin D, Ergocalciferol, (DRISDOL) 1.25 MG (50000 UNIT) CAPS capsule Take 1 capsule (50,000 Units total) by mouth every 7 (seven) days. (Patient not taking: Reported on 06/02/2021) 4 capsule 0  ? ?No current facility-administered medications for this visit.  ? ? ?PHYSICAL EXAMINATION: ?ECOG PERFORMANCE STATUS: 0 - Asymptomatic ? ?Vitals:  ? 06/14/21 0813 06/14/21 [id number]  ?BP: (!) 142/84   ?Pulse: 69 69  ?Resp: 17   ?Temp: 98.1 ?F (36.7 ?C)   ?SpO2: 98%   ? ?Wt Readings from Last 3 Encounters:  ?06/14/21 208 lb 4.8 oz (94.5 kg)  ?06/02/21 208 lb 8 oz (94.6 kg)  ?05/18/21 215 lb (97.5 kg)  ?  ? ?GENERAL:alert, no distress and comfortable ?SKIN: skin color, texture, turgor are normal, no rashes or significant lesions ?EYES: normal, Conjunctiva are pink and non-injected, sclera clear  ?NECK: supple, thyroid normal size, non-tender, without nodularity ?LYMPH:  no palpable lymphadenopathy in the cervical, axillary ?NEURO: alert & oriented x 3 with fluent speech, no focal motor/sensory deficits ?BREAST: No palpable mass, nodules or adenopathy bilaterally. Breast exam benign.  ? ?LABORATORY DATA:  ?I have reviewed the data as listed ? ?  Latest Ref Rng & Units 01/08/2020  ?  9:11 AM 10/21/2018  ?  1:38 PM 04/16/2018  ? 12:29 PM  ?CBC  ?WBC 4.0 - 10.5 K/uL 9.1   12.6   12.1    ?Hemoglobin 12.0 - 15.0 g/dL 13.5   13.4   15.0    ?Hematocrit 36.0 - 46.0 % 41.5   41.7   45.0    ?Platelets 150 - 400 K/uL 373   346     ? ? ? ? ?  Latest Ref Rng & Units 02/08/2021  ?  9:07 AM 08/16/2020  ?  8:46 AM 06/28/2020  ?  8:28 AM  ?CMP   ?Glucose 70 - 99 mg/dL 183   209   238    ?BUN 8 - 27 mg/dL '19   23   12    '$ ?Creatinine 0.57 - 1.00 mg/dL 0.92   1.31   0.71    ?Sodium 134 - 144 mmol/L 142   137   139    ?Potassium 3.5 - 5.2 mmol/L 4.2   3.5   4.1    ?Chloride 96 - 106 mmol/L 102   98   102    ?CO2 20 - 29 mmol/L '24   24   21    '$ ?Calcium 8.7 -  10.3 mg/dL 9.7   10.0   9.6    ?Total Protein 6.0 - 8.5 g/dL 8.1   8.8   7.3    ?Total Bilirubin 0.0 - 1.2 mg/dL 0.3   0.8   0.3    ?Alkaline Phos 44 - 121 IU/L 114   109   118    ?AST 0 - 40 IU/L '14   23   17    '$ ?ALT 0 - 32 IU/L '9   17   17    '$ ? ? ? ? ?RADIOGRAPHIC STUDIES: ?I have personally reviewed the radiological images as listed and agreed with the findings in the report. ?No results found.  ? ? ?Orders Placed This Encounter  ?Procedures  ? MR BREAST BILATERAL W WO CONTRAST INC CAD  ?  Standing Status:   Future  ?  Standing Expiration Date:   06/15/2022  ?  Order Specific Question:   If indicated for the ordered procedure, I authorize the administration of contrast media per Radiology protocol  ?  Answer:   Yes  ?  Order Specific Question:   What is the patient's sedation requirement?  ?  Answer:   No Sedation  ?  Order Specific Question:   Does the patient have a pacemaker or implanted devices?  ?  Answer:   No  ?  Order Specific Question:   Radiology Contrast Protocol - do NOT remove file path  ?  Answer:   [email or web address]  ?  Order Specific Question:   Preferred imaging location?  ?  Answer:   [facility] (table limit-550lbs)  ? ?All questions were answered. The patient knows to call the clinic with any problems, questions or concerns. No barriers to learning was detected. ?The total time spent in the appointment was 25 minutes. ? ?  ? Truitt Merle, MD ?06/14/2021  ? ?I, Wilburn Mylar, am acting as scribe for Truitt Merle, MD.  ? ?I have reviewed the above documentation for accuracy and completeness, and I agree with the above. ?  ? ? ?

## 2021-06-15 ENCOUNTER — Encounter (INDEPENDENT_AMBULATORY_CARE_PROVIDER_SITE_OTHER): Payer: Self-pay | Admitting: Family Medicine

## 2021-06-15 ENCOUNTER — Other Ambulatory Visit (INDEPENDENT_AMBULATORY_CARE_PROVIDER_SITE_OTHER): Payer: Self-pay | Admitting: Family Medicine

## 2021-06-15 ENCOUNTER — Ambulatory Visit (INDEPENDENT_AMBULATORY_CARE_PROVIDER_SITE_OTHER): Payer: BC Managed Care – PPO | Admitting: Family Medicine

## 2021-06-15 ENCOUNTER — Other Ambulatory Visit: Payer: Self-pay | Admitting: Endocrinology

## 2021-06-15 VITALS — BP 122/76 | HR 80 | Temp 98.0°F | Ht 63.0 in | Wt 204.0 lb

## 2021-06-15 DIAGNOSIS — E1169 Type 2 diabetes mellitus with other specified complication: Secondary | ICD-10-CM

## 2021-06-15 DIAGNOSIS — Z6836 Body mass index (BMI) 36.0-36.9, adult: Secondary | ICD-10-CM

## 2021-06-15 DIAGNOSIS — E669 Obesity, unspecified: Secondary | ICD-10-CM

## 2021-06-15 DIAGNOSIS — Z794 Long term (current) use of insulin: Secondary | ICD-10-CM

## 2021-06-15 DIAGNOSIS — Z7985 Long-term (current) use of injectable non-insulin antidiabetic drugs: Secondary | ICD-10-CM

## 2021-06-15 MED ORDER — ONETOUCH VERIO VI STRP
1.0000 | ORAL_STRIP | 1 refills | Status: DC | PRN
Start: 2021-06-15 — End: 2021-06-29

## 2021-06-15 MED ORDER — SEMAGLUTIDE (2 MG/DOSE) 8 MG/3ML ~~LOC~~ SOPN: 2.0000 mg | PEN_INJECTOR | SUBCUTANEOUS | 0 refills | Status: DC

## 2021-06-20 ENCOUNTER — Telehealth: Payer: Self-pay | Admitting: Hematology

## 2021-06-20 NOTE — Telephone Encounter (Signed)
Left message with follow-up appointment per 4/25 los. ?

## 2021-06-21 ENCOUNTER — Other Ambulatory Visit (INDEPENDENT_AMBULATORY_CARE_PROVIDER_SITE_OTHER): Payer: BC Managed Care – PPO

## 2021-06-21 DIAGNOSIS — E559 Vitamin D deficiency, unspecified: Secondary | ICD-10-CM

## 2021-06-21 DIAGNOSIS — E1165 Type 2 diabetes mellitus with hyperglycemia: Secondary | ICD-10-CM

## 2021-06-21 LAB — COMPREHENSIVE METABOLIC PANEL
ALT: 10 U/L (ref 0–35)
AST: 15 U/L (ref 0–37)
Albumin: 3.7 g/dL (ref 3.5–5.2)
Alkaline Phosphatase: 75 U/L (ref 39–117)
BUN: 10 mg/dL (ref 6–23)
CO2: 23 mEq/L (ref 19–32)
Calcium: 8.6 mg/dL (ref 8.4–10.5)
Chloride: 107 mEq/L (ref 96–112)
Creatinine, Ser: 1.01 mg/dL (ref 0.40–1.20)
GFR: 58.95 mL/min — ABNORMAL LOW (ref >60.00)
Glucose, Bld: 123 mg/dL — ABNORMAL HIGH (ref 70–99)
Potassium: 3.3 mEq/L — ABNORMAL LOW (ref 3.5–5.1)
Sodium: 139 mEq/L (ref 135–145)
Total Bilirubin: 0.4 mg/dL (ref 0.2–1.2)
Total Protein: 7.3 g/dL (ref 6.0–8.3)

## 2021-06-21 LAB — HEMOGLOBIN A1C: Hgb A1c MFr Bld: 6.9 % — ABNORMAL HIGH (ref 4.6–6.5)

## 2021-06-21 LAB — LDL CHOLESTEROL, DIRECT: Direct LDL: 110 mg/dL

## 2021-06-21 LAB — VITAMIN D 25 HYDROXY (VIT D DEFICIENCY, FRACTURES): VITD: 14.85 ng/mL — ABNORMAL LOW (ref 30.00–100.00)

## 2021-06-21 LAB — LIPID PANEL
Cholesterol: 178 mg/dL (ref 0–200)
HDL: 31.3 mg/dL — ABNORMAL LOW (ref >39.00)
NonHDL: 146.41
Total CHOL/HDL Ratio: 6
Triglycerides: 230 mg/dL — ABNORMAL HIGH (ref 0.0–149.0)
VLDL: 46 mg/dL — ABNORMAL HIGH (ref 0.0–40.0)

## 2021-06-29 NOTE — Progress Notes (Signed)
Chief Complaint:   OBESITY Monica Clark is here to discuss her progress with her obesity treatment plan along with follow-up of her obesity related diagnoses. Monica Clark is on keeping a food journal and adhering to recommended goals of 1400 calories and 80+ grams of protein daily and states she is following her eating plan approximately 70% of the time. Monica Clark states she is doing 0 minutes 0 times per week.  Today's visit was #: 36 Starting weight: 216 lbs Starting date: 04/16/2018 Today's weight: 204 lbs Today's date: 06/15/2021 Total lbs lost to date: 12 Total lbs lost since last in-office visit: 9  Interim History: Monica Clark has lost a significant amount of weight since her last visit. Her mother is in Hospice now and she is dealing with stress and grief. She is naturally missing meals and knows she needs to remember to take care of herself.   Subjective:   1. Type 2 diabetes mellitus with other specified complication, with long-term current use of insulin (HCC) Monica Clark is stable on Ozempic, and she denies nausea or vomiting. She is working on [employer].  Assessment/Plan:   1. Type 2 diabetes mellitus with other specified complication, with long-term current use of insulin (HCC) We will refill Ozempic for 90 days and Verio strips, with no additional refill.   - Semaglutide, 2 MG/DOSE, 8 MG/3ML SOPN; Inject 2 mg as directed once a week.  Dispense: 9 mL; Refill: 0  2. Obesity with current BMI of 36.2 Monica Clark is currently in the action stage of change. As such, her goal is to continue with weight loss efforts. She has agreed to keeping a food journal and adhering to recommended goals of 1400 calories and 80+ grams of protein daily.   Behavioral modification strategies: no skipping meals and better snacking choices.  Monica Clark has agreed to follow-up with our clinic in 4 weeks. She was informed of the importance of frequent follow-up visits to maximize her success with intensive lifestyle  modifications for her multiple health conditions.   Objective:   Blood pressure 122/76, pulse 80, temperature 98 F (36.7 C), height '5\' 3"'$  (1.6 m), weight 204 lb (92.5 kg), SpO2 98 %. Body mass index is 36.14 kg/m.  General: Cooperative, alert, well developed, in no acute distress. HEENT: Conjunctivae and lids unremarkable. Cardiovascular: Regular rhythm.  Lungs: Normal work of breathing. Neurologic: No focal deficits.   Lab Results  Component Value Date   CREATININE 1.01 06/21/2021   BUN 10 06/21/2021   NA 139 06/21/2021   K 3.3 (L) 06/21/2021   CL 107 06/21/2021   CO2 23 06/21/2021   Lab Results  Component Value Date   ALT 10 06/21/2021   AST 15 06/21/2021   ALKPHOS 75 06/21/2021   BILITOT 0.4 06/21/2021   Lab Results  Component Value Date   HGBA1C 6.9 (H) 06/21/2021   HGBA1C 9.0 (H) 02/08/2021   HGBA1C 11.0 (H) 06/28/2020   HGBA1C 6.4 (A) 08/05/2019   HGBA1C 7.5 (H) 12/05/2018   Lab Results  Component Value Date   INSULIN 27.9 (H) 06/28/2020   INSULIN 8.7 04/16/2018   Lab Results  Component Value Date   TSH 2.220 04/16/2018   Lab Results  Component Value Date   CHOL 178 06/21/2021   HDL 31.30 (L) 06/21/2021   LDLCALC 121 (H) 02/08/2021   LDLDIRECT 110.0 06/21/2021   TRIG 230.0 (H) 06/21/2021   CHOLHDL 6 06/21/2021   Lab Results  Component Value Date   VD25OH 14.85 (L) 06/21/2021  VD25OH 24.7 (L) 02/08/2021   VD25OH 12.6 (L) 06/28/2020   Lab Results  Component Value Date   WBC 9.1 01/08/2020   HGB 13.5 01/08/2020   HCT 41.5 01/08/2020   MCV 92.2 01/08/2020   PLT 373 01/08/2020   No results found for: IRON, TIBC, FERRITIN  Attestation Statements:   Reviewed by clinician on day of visit: allergies, medications, problem list, medical history, surgical history, family history, social history, and previous encounter notes.  Time spent on visit including pre-visit chart review and post-visit care and charting was 35 minutes.    I, Trixie Dredge, am acting as transcriptionist for Dennard Nip, MD.  I have reviewed the above documentation for accuracy and completeness, and I agree with the above. -  Dennard Nip, MD

## 2021-06-30 MED ORDER — ONETOUCH VERIO VI STRP: 1.0000 | ORAL_STRIP | 0 refills | Status: DC | PRN

## 2021-07-04 ENCOUNTER — Ambulatory Visit
Admission: RE | Admit: 2021-07-04 | Discharge: 2021-07-04 | Disposition: A | Discharge status: Home / Self Care | Payer: BC Managed Care – PPO | Source: Ambulatory Visit | Attending: Hematology | Admitting: Hematology

## 2021-07-04 DIAGNOSIS — N6099 Unspecified benign mammary dysplasia of unspecified breast: Secondary | ICD-10-CM

## 2021-07-04 IMAGING — MR MR BREAST BILAT WO/W CM
8 of 12 series · 33 of 48 positions shown · IV contrast (gadavist)
Comparison: Prior exams.

CLINICAL DATA: Screening. Patient with a family history of breast
carcinoma, cousin diagnosed at age 61. Patient was recently
evaluated at [facility] for a clear right nipple discharge. Exam was
unremarkable other than a 4 mm benign left breast cyst.

EXAM:
BILATERAL BREAST MRI WITH AND WITHOUT CONTRAST
TECHNIQUE: Multiplanar, multisequence MR images of both breasts were obtained
prior to and following the intravenous administration of 10 ml of
Gadavist

[Series 2: t2_tirm_tra ipat (a-p) · axial · 3.0mm · 0.70mm/px · 1 of 55 slices shown]
[im 1/55]
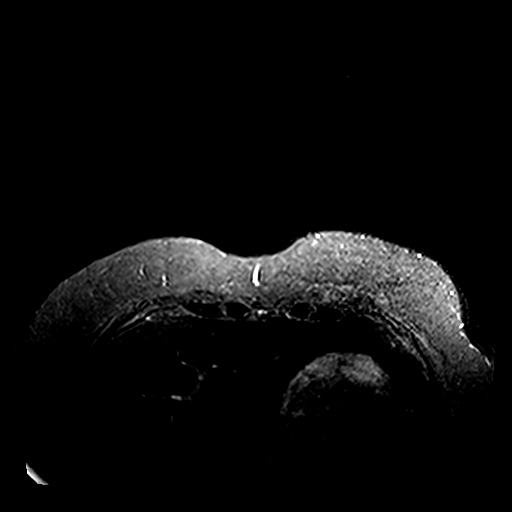

[Series 3: fl3d pre-cm no · axial · non-contrast · 1.2mm · 0.94mm/px · z∈[-75,+96]mm · 5 of 144 slices shown]
[im 1/144]
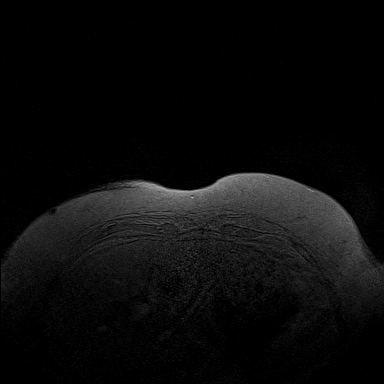
[im 36/144]
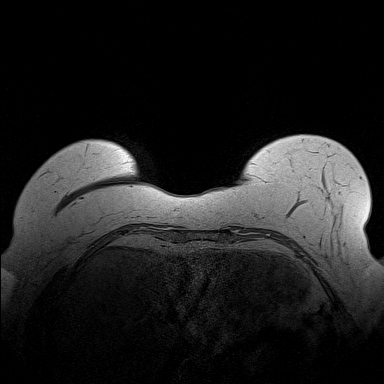
[im 72/144]
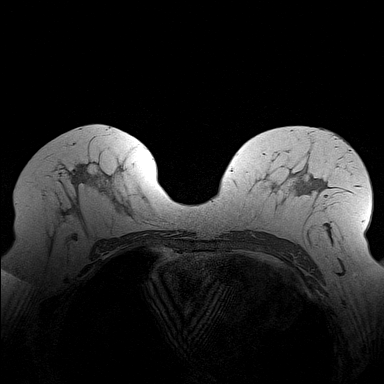
[im 108/144]
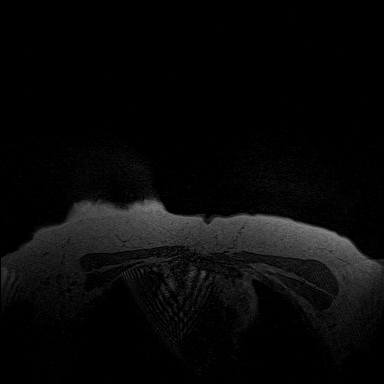
[im 144/144]
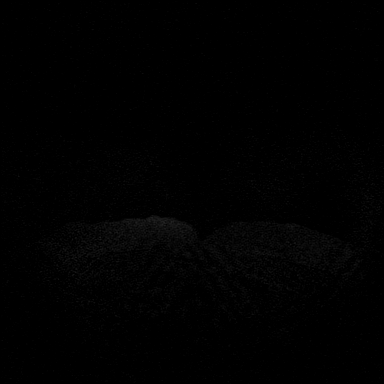

[Series 4: fl3d pre-cm · axial · non-contrast · 1.2mm · 0.94mm/px · z∈[-75,+96]mm · 5 of 144 slices shown]
[im 1/144]
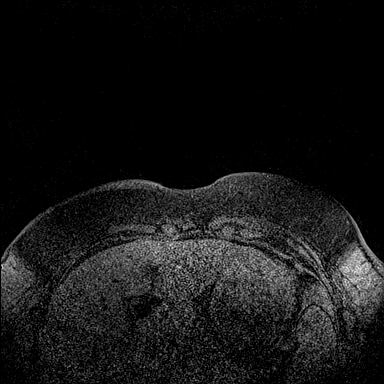
[im 36/144]
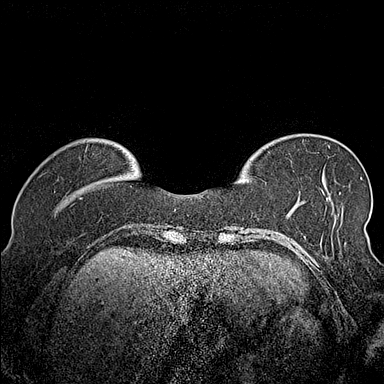
[im 72/144]
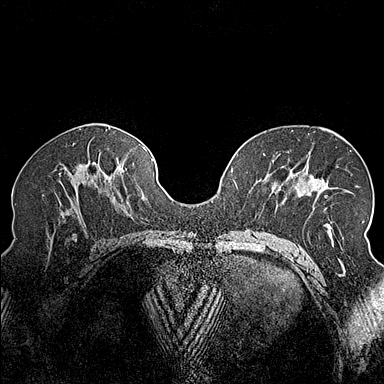
[im 108/144]
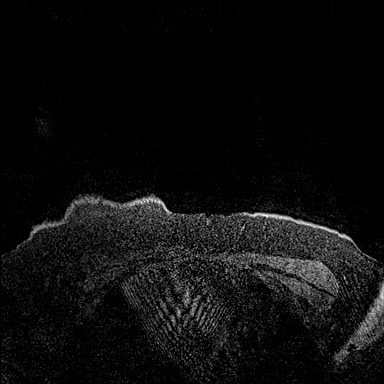
[im 144/144]
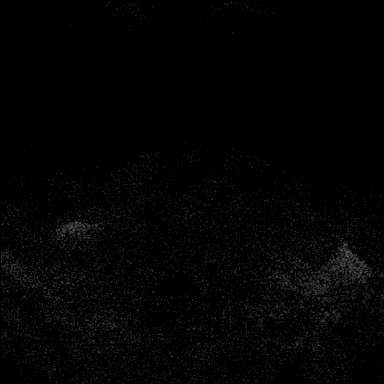

[Series 5: fl3d post-cm 20 · axial · 1.2mm · 0.94mm/px · z∈[-75,+96]mm · 5 of 144 slices shown (1 of 3)]
[im 1/144]
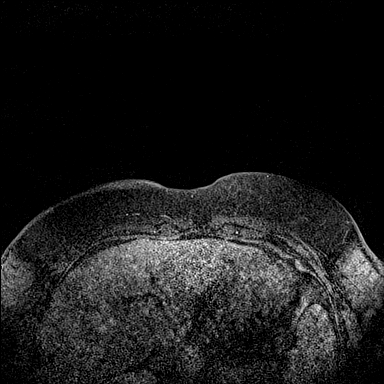
[im 36/144]
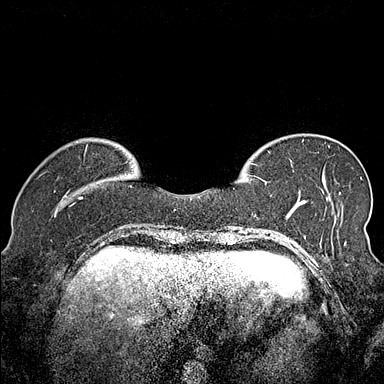
[im 72/144]
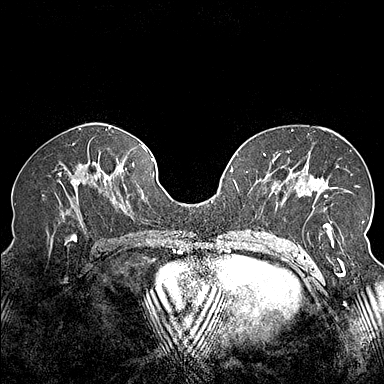
[im 108/144]
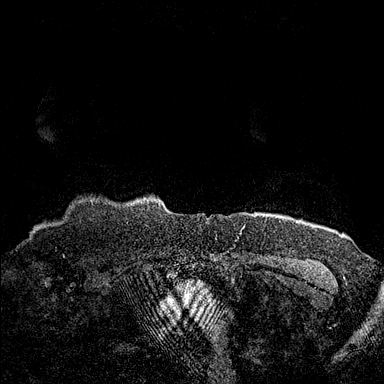
[im 144/144]
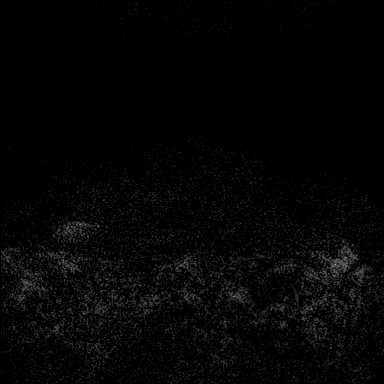

[Series 6: fl3d post-cm 20 · axial · 1.2mm · 0.94mm/px · z∈[-75,+96]mm · 5 of 144 slices shown (2 of 3)]
[im 1/144]
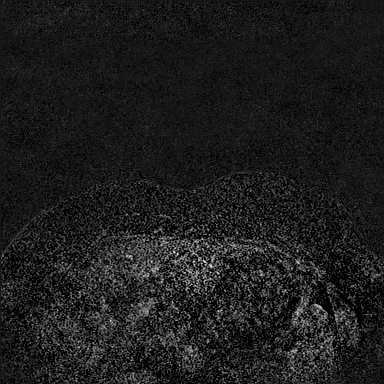
[im 36/144]
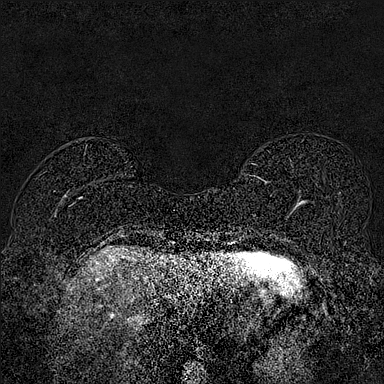
[im 72/144]
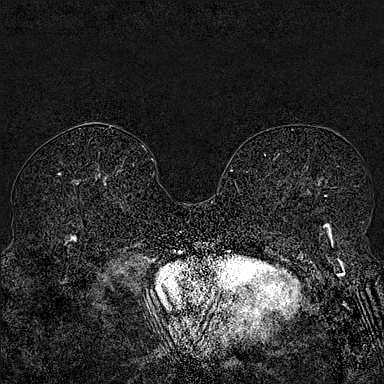
[im 108/144]
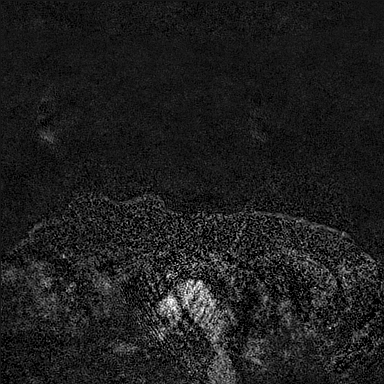
[im 144/144]
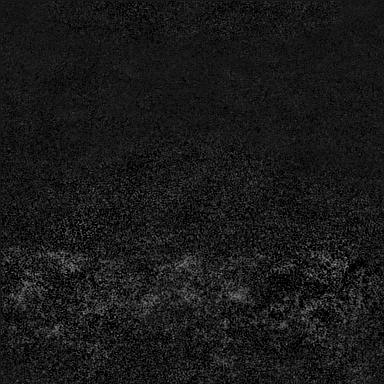

[Series 7: fl3d post-cm 20 · axial · 172.8mm · 0.94mm/px · 1 of 1 slices shown (3 of 3)]
[im 1/1]
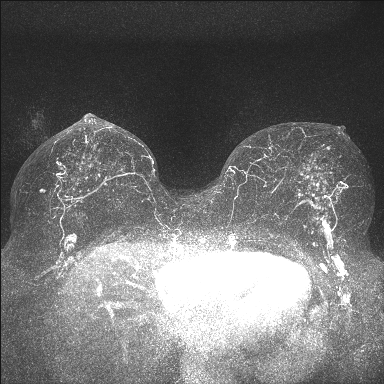

[Series 8: fl3d post-cm 3 · axial · 1.2mm · 0.94mm/px · z∈[-75,+96]mm · 6 of 144 slices shown (1 of 2)]
[im 1/144]
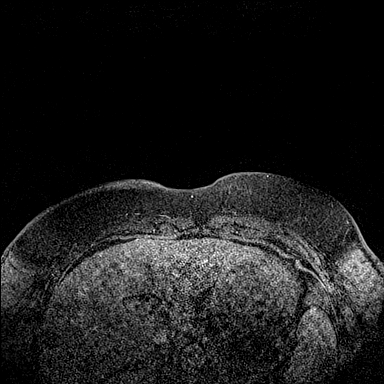
[im 29/144]
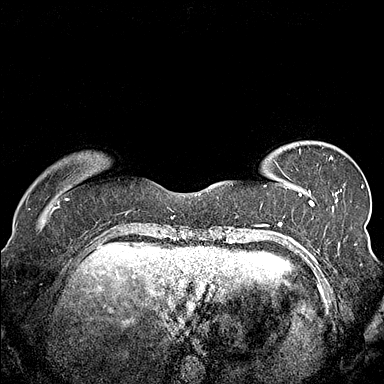
[im 58/144]
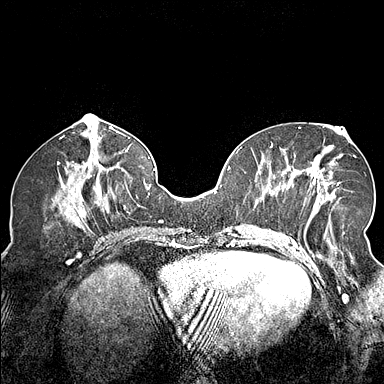
[im 86/144]
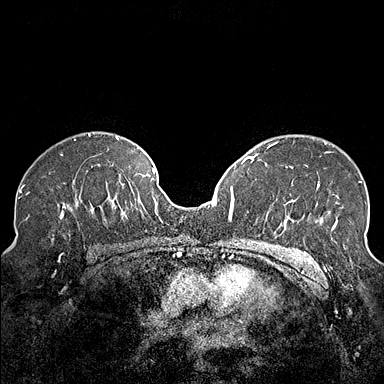
[im 115/144]
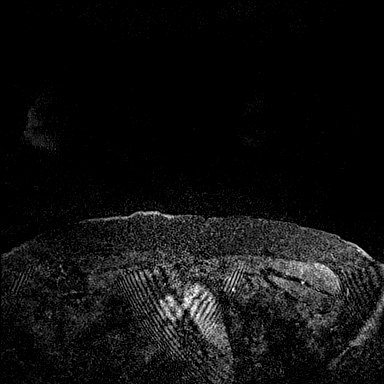
[im 144/144]
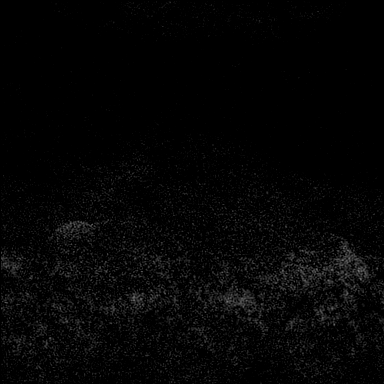

[Series 9: fl3d post-cm 3 · axial · 1.2mm · 0.94mm/px · z∈[-75,+61]mm · 5 of 144 slices shown (2 of 2)]
[im 1/144]
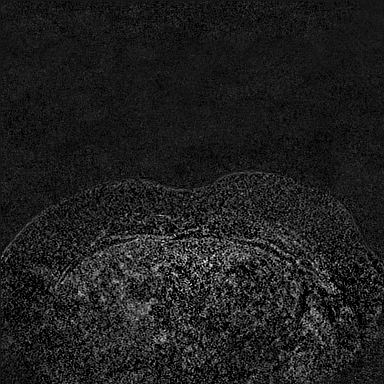
[im 29/144]
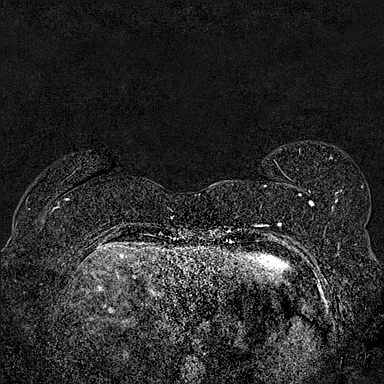
[im 58/144]
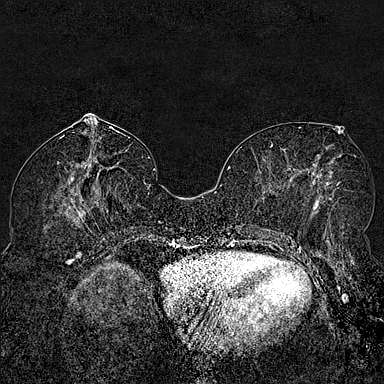
[im 86/144]
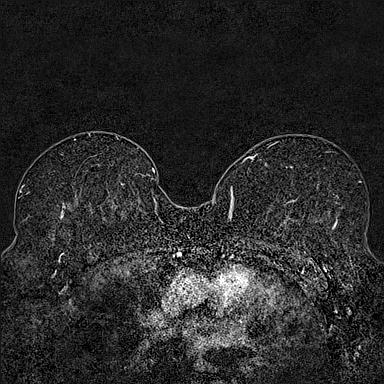
[im 115/144]
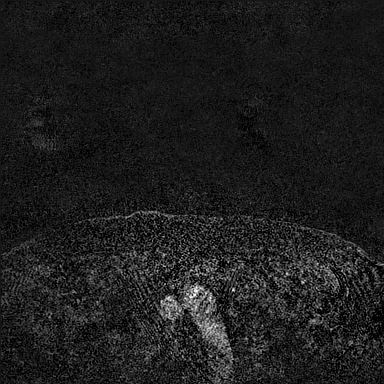

[33 of 48 positions shown; findings below may reference images not displayed]

Three-dimensional MR images were rendered by post-processing of the
original MR data on an independent workstation. The
three-dimensional MR images were interpreted, and findings are
reported in the following complete MRI report for this study. Three
dimensional images were evaluated at the independent interpreting
workstation using the DynaCAD thin client.
FINDINGS: Breast composition: b. Scattered fibroglandular tissue.

Background parenchymal enhancement: Mild

Right breast: There is a 7 mm focus of linear enhancement in the
retroareolar breast, which shows predominantly progressive and
plateau kinetics. There is associated linear hyperintense T2 signal.

There are no suspicious masses or other areas of abnormal
enhancement. Several small benign cysts.

Left breast: No suspicious mass or abnormal enhancement. Several
small benign cysts.

Lymph nodes: No abnormal appearing lymph nodes.

Ancillary findings:  None.
IMPRESSION: 1. 7 mm linear focus enhancement in the retroareolar right breast.
Given the history nipple discharge, tissue sampling is recommended.
2. No other areas of abnormal enhancement.

RECOMMENDATION:
1. MRI guided core needle biopsy of the 7 mm area of linear
enhancement in retroareolar right breast, centered on image 91,
series 6.

BI-RADS CATEGORY  4: Suspicious.

## 2021-07-04 MED ORDER — GADOBUTROL 1 MMOL/ML IV SOLN
10.0000 mL | Freq: Once | INTRAVENOUS | Status: AC | PRN
  Administered 2021-07-04: 10 mL via INTRAVENOUS

## 2021-07-05 ENCOUNTER — Other Ambulatory Visit: Payer: Self-pay | Admitting: Hematology

## 2021-07-05 DIAGNOSIS — N6099 Unspecified benign mammary dysplasia of unspecified breast: Secondary | ICD-10-CM

## 2021-07-06 ENCOUNTER — Other Ambulatory Visit: Payer: Self-pay | Admitting: Hematology

## 2021-07-06 ENCOUNTER — Telehealth: Payer: Self-pay

## 2021-07-06 DIAGNOSIS — N6099 Unspecified benign mammary dysplasia of unspecified breast: Secondary | ICD-10-CM

## 2021-07-06 NOTE — Telephone Encounter (Signed)
Pt Monica Clark asking why the MRI biopsy is needed.  Notified Dr. Burr Medico of pt's question and asked if Dr. Burr Medico would please call pt to further discuss need for MRI Biopsy. ?

## 2021-07-13 ENCOUNTER — Ambulatory Visit (INDEPENDENT_AMBULATORY_CARE_PROVIDER_SITE_OTHER): Payer: BC Managed Care – PPO | Admitting: Family Medicine

## 2021-07-13 ENCOUNTER — Encounter (INDEPENDENT_AMBULATORY_CARE_PROVIDER_SITE_OTHER): Payer: Self-pay | Admitting: Family Medicine

## 2021-07-13 VITALS — BP 116/72 | HR 63 | Temp 98.0°F | Ht 63.0 in | Wt 203.0 lb

## 2021-07-13 DIAGNOSIS — E1169 Type 2 diabetes mellitus with other specified complication: Secondary | ICD-10-CM | POA: Diagnosis not present

## 2021-07-13 DIAGNOSIS — Z794 Long term (current) use of insulin: Secondary | ICD-10-CM

## 2021-07-13 DIAGNOSIS — E669 Obesity, unspecified: Secondary | ICD-10-CM

## 2021-07-13 DIAGNOSIS — Z6836 Body mass index (BMI) 36.0-36.9, adult: Secondary | ICD-10-CM

## 2021-07-13 DIAGNOSIS — E876 Hypokalemia: Secondary | ICD-10-CM | POA: Diagnosis not present

## 2021-07-13 DIAGNOSIS — E559 Vitamin D deficiency, unspecified: Secondary | ICD-10-CM | POA: Diagnosis not present

## 2021-07-13 DIAGNOSIS — Z9189 Other specified personal risk factors, not elsewhere classified: Secondary | ICD-10-CM

## 2021-07-13 DIAGNOSIS — Z7985 Long-term (current) use of injectable non-insulin antidiabetic drugs: Secondary | ICD-10-CM

## 2021-07-13 MED ORDER — TIRZEPATIDE 15 MG/0.5ML ~~LOC~~ SOAJ: 15.0000 mg | SUBCUTANEOUS | 0 refills | Status: DC

## 2021-07-13 MED ORDER — SEMAGLUTIDE (2 MG/DOSE) 8 MG/3ML ~~LOC~~ SOPN: 2.0000 mg | PEN_INJECTOR | SUBCUTANEOUS | 0 refills | Status: DC

## 2021-07-13 MED ORDER — VITAMIN D (ERGOCALCIFEROL) 1.25 MG (50000 UNIT) PO CAPS: 50000.0000 [IU] | ORAL_CAPSULE | ORAL | 0 refills | Status: DC

## 2021-07-14 ENCOUNTER — Ambulatory Visit
Admission: RE | Admit: 2021-07-14 | Discharge: 2021-07-14 | Disposition: A | Discharge status: Home / Self Care | Payer: BC Managed Care – PPO | Source: Ambulatory Visit | Attending: Hematology | Admitting: Hematology

## 2021-07-14 DIAGNOSIS — N6099 Unspecified benign mammary dysplasia of unspecified breast: Secondary | ICD-10-CM

## 2021-07-14 DIAGNOSIS — N6452 Nipple discharge: Secondary | ICD-10-CM

## 2021-07-14 LAB — CMP14+EGFR
ALT: 11 IU/L (ref 0–32)
AST: 13 IU/L (ref 0–40)
Albumin/Globulin Ratio: 1.3 (ref 1.2–2.2)
Albumin: 4.3 g/dL (ref 3.8–4.8)
Alkaline Phosphatase: 89 IU/L (ref 44–121)
BUN/Creatinine Ratio: 13 (ref 12–28)
BUN: 11 mg/dL (ref 8–27)
Bilirubin Total: 0.3 mg/dL (ref 0.0–1.2)
CO2: 26 mmol/L (ref 20–29)
Calcium: 9.5 mg/dL (ref 8.7–10.3)
Chloride: 102 mmol/L (ref 96–106)
Creatinine, Ser: 0.85 mg/dL (ref 0.57–1.00)
Globulin, Total: 3.3 g/dL (ref 1.5–4.5)
Glucose: 94 mg/dL (ref 70–99)
Potassium: 3.8 mmol/L (ref 3.5–5.2)
Sodium: 143 mmol/L (ref 134–144)
Total Protein: 7.6 g/dL (ref 6.0–8.5)
eGFR: 76 mL/min/{1.73_m2} (ref >59)

## 2021-07-14 IMAGING — MG MM BREAST LOCALIZATION CLIP
4 series · 4 of 12 positions shown · non-contrast
Comparison: Previous exam(s).

CLINICAL DATA: Status post MR guided core biopsy of the RIGHT
breast.

EXAM:
3D DIAGNOSTIC RIGHT MAMMOGRAM POST MRI BIOPSY

[R CC synth-2D]
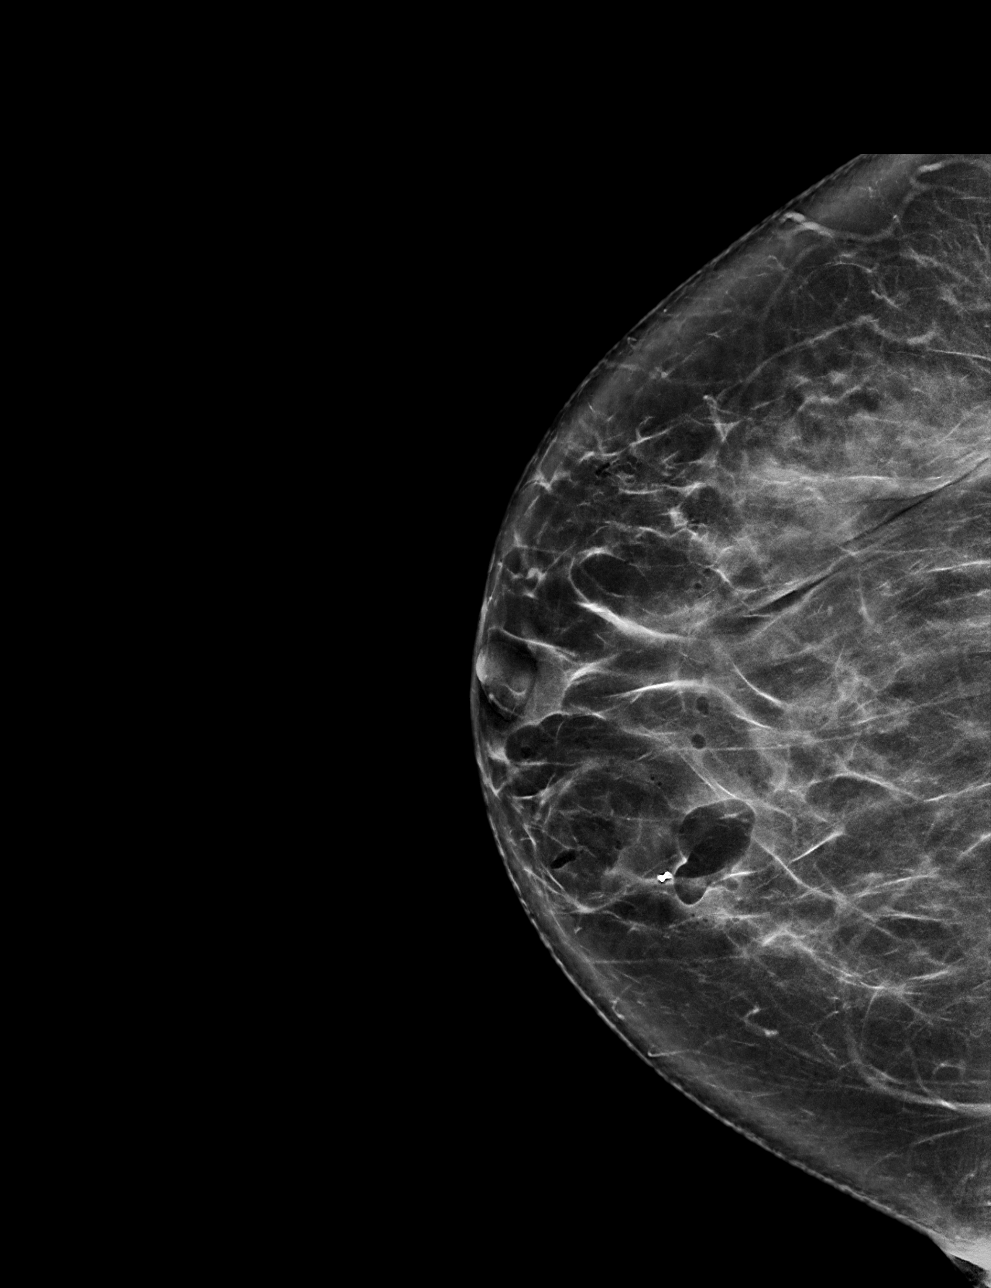

[R ML synth-2D]
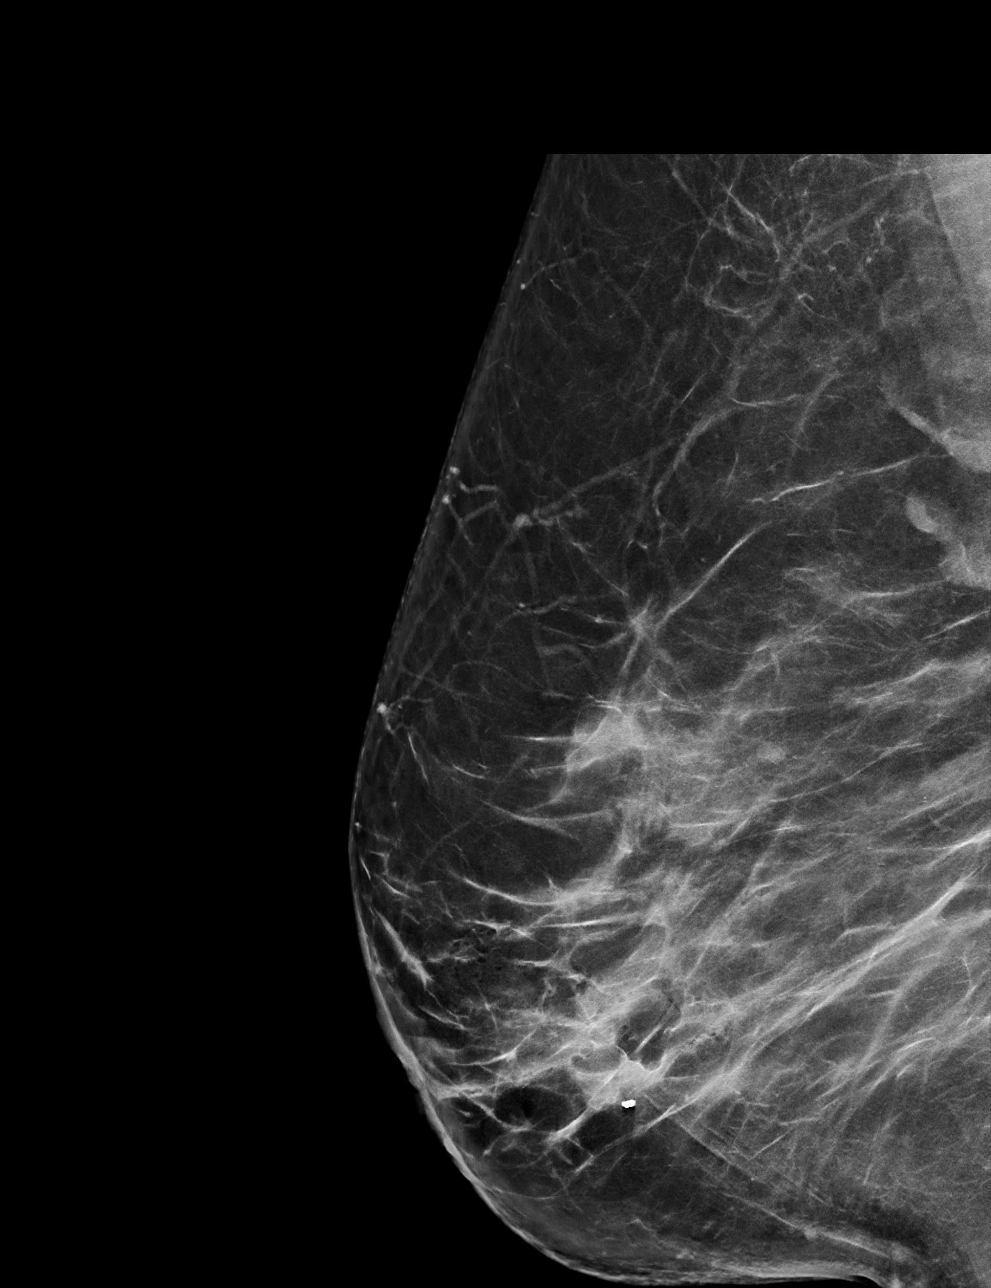

[R CC tomo · tomo slice 37/73.0]
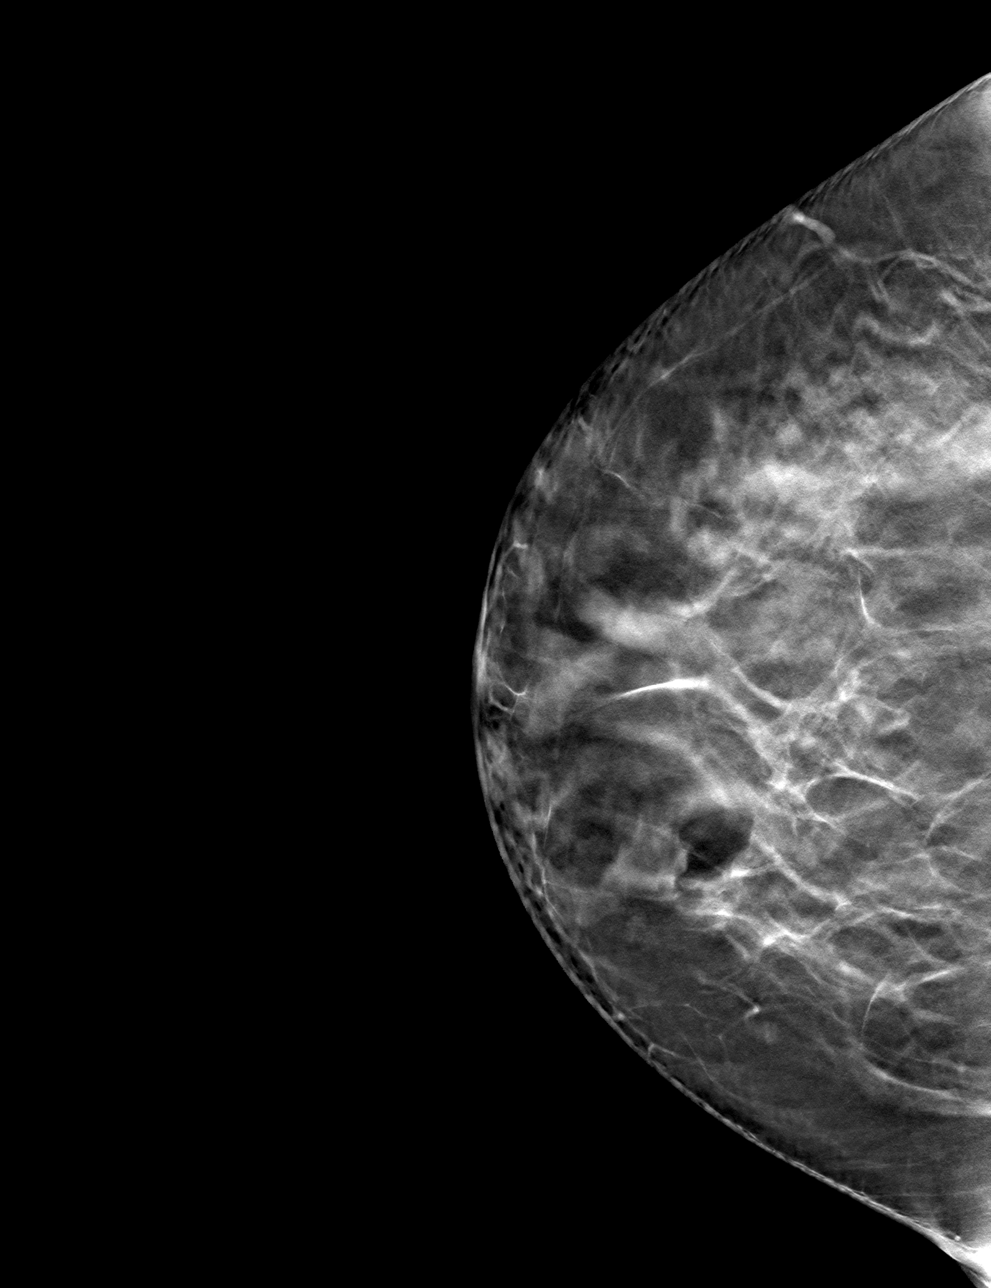

[R ML tomo · tomo slice 42/83.0]
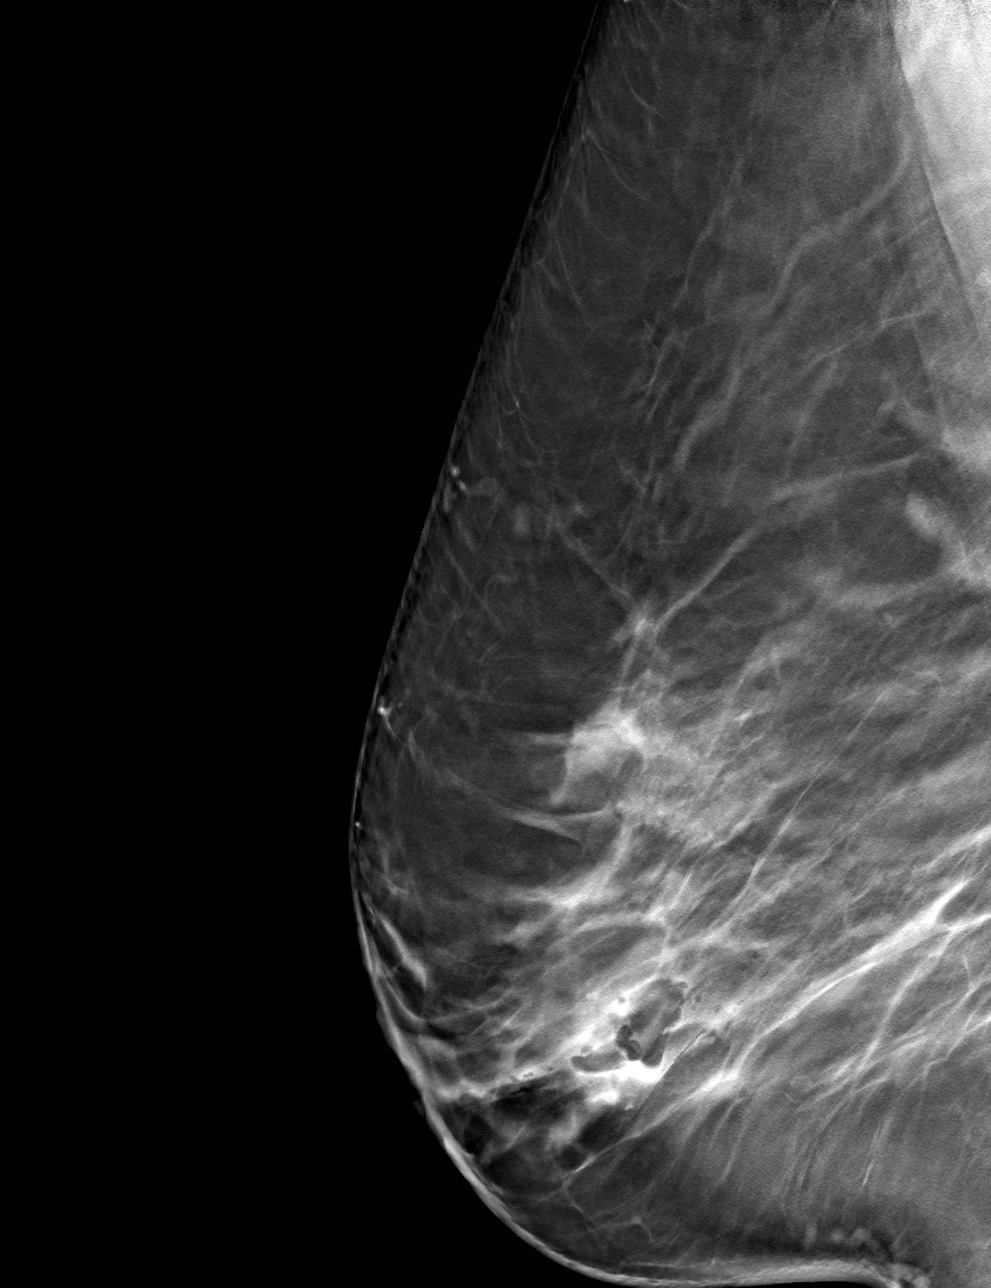

[4 of 12 positions shown; findings below may reference images not displayed]

FINDINGS: 3D Mammographic images were obtained following MRI guided biopsy of
linear non mass enhancement in the retroareolar region of the RIGHT
breast and placement of a barbell clip. The biopsy marking clip is
3.2 centimeters MEDIAL to the biopsy site.
IMPRESSION: Barbell clip is 3.2 centimeters MEDIAL to the biopsy site.

Final Assessment: Post Procedure Mammograms for Marker Placement

## 2021-07-14 IMAGING — MR MR BREAST BX W/ LOC DEV 1ST LEASION IMAGE BX SPEC MR GUIDE*R*
7 of 10 series · 33 of 48 positions shown · IV contrast (10 ml gadavist)
Comparison: Prior studies
COMPARISON: Prior studies

Addendum:
CLINICAL DATA: Patient presents for MR guided biopsy of non mass
linear enhancement the retroareolar region of the RIGHT breast.
Patient has history of excised atypia of the LEFT breast which was
associated with spontaneous nipple discharge.

EXAM:
MRI GUIDED CORE NEEDLE BIOPSY OF THE RIGHT BREAST
TECHNIQUE: Multiplanar, multisequence MR imaging of the RIGHT breast was
performed both before and after administration of intravenous
contrast.
CONTRAST:  10mL GADAVIST GADOBUTROL 1 MMOL/ML IV SOLN

[Series 2: fiducial unilateral · sagittal · 2.0mm · 1.33mm/px · 3 of 52 slices shown]
[im 1/52]
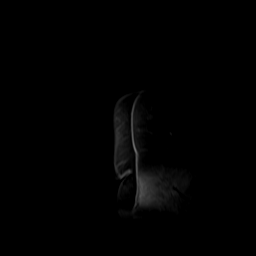
[im 26/52]
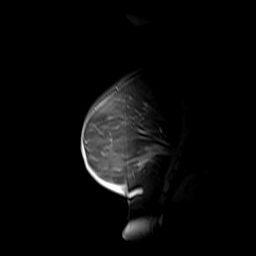
[im 52/52]
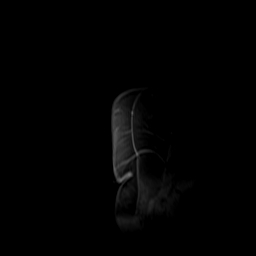

[Series 3: dynamic pre · axial · non-contrast · 1.3mm · 0.73mm/px · z∈[-103,+83]mm · 6 of 144 slices shown]
[im 1/144]
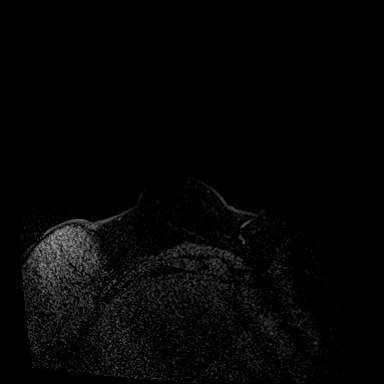
[im 29/144]
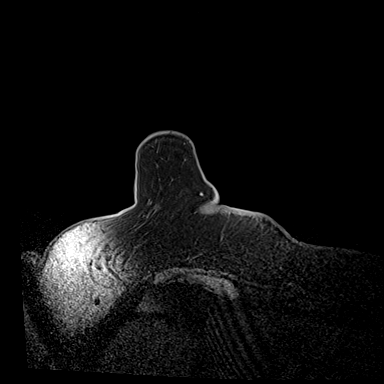
[im 58/144]
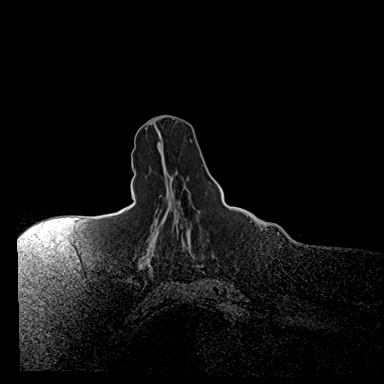
[im 86/144]
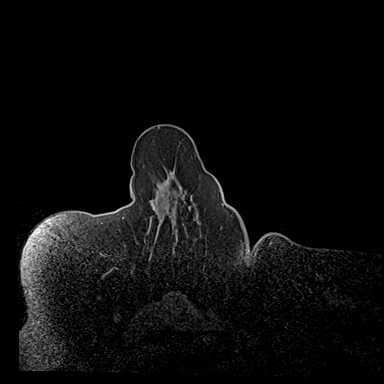
[im 115/144]
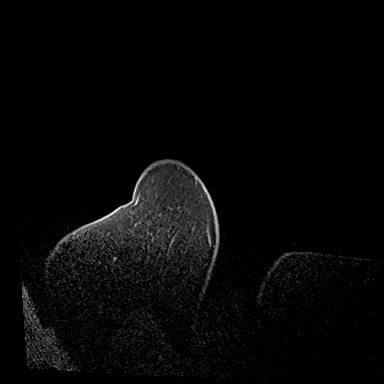
[im 144/144]
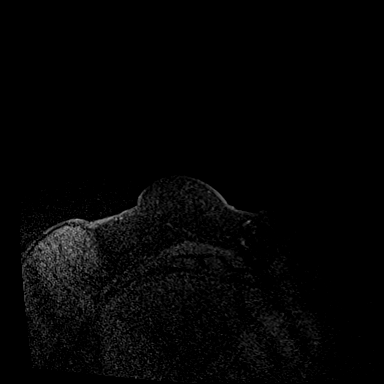

[Series 4: dynamic post 20 · axial · 1.3mm · 0.73mm/px · z∈[-103,+83]mm · 6 of 144 slices shown (1 of 2)]
[im 1/144]
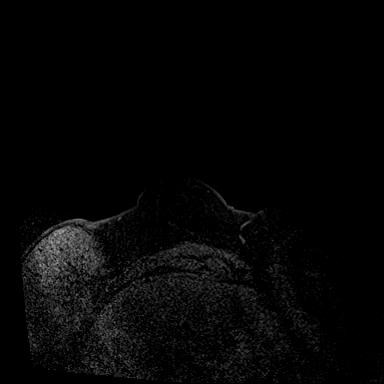
[im 29/144]
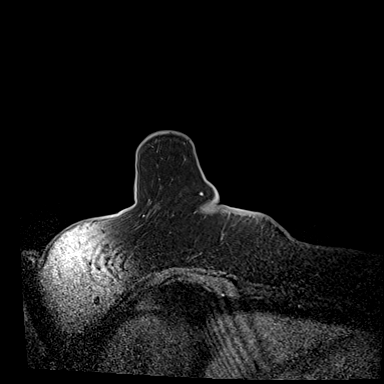
[im 58/144]
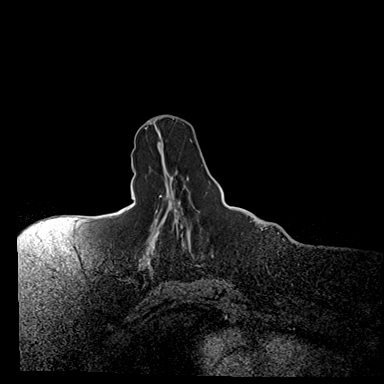
[im 86/144]
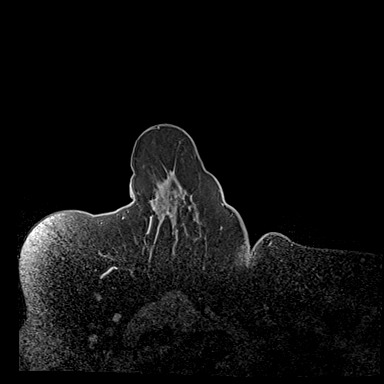
[im 115/144]
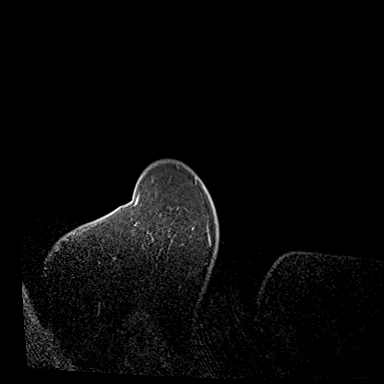
[im 144/144]
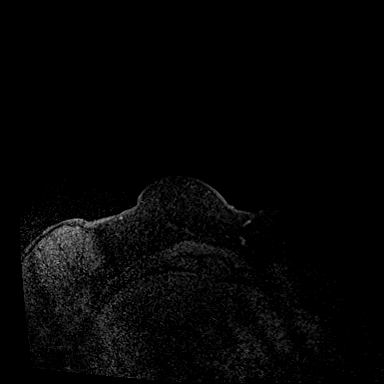

[Series 5: dynamic post 20 · axial · 1.3mm · 0.73mm/px · z∈[-103,+83]mm · 5 of 144 slices shown (2 of 2)]
[im 1/144]
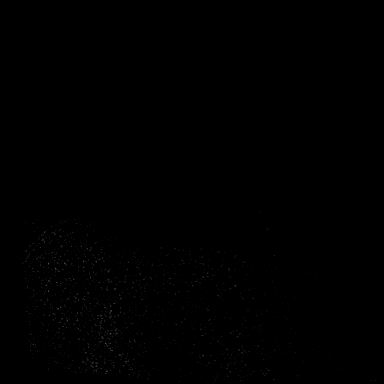
[im 36/144]
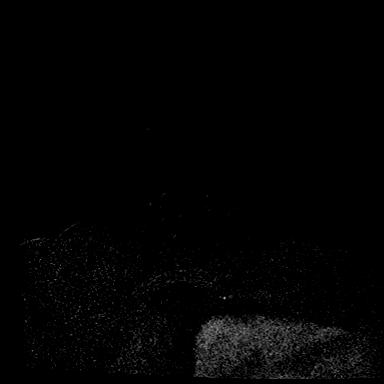
[im 72/144]
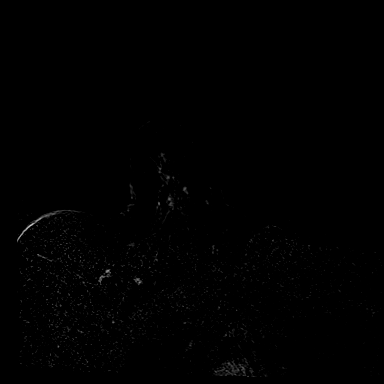
[im 108/144]
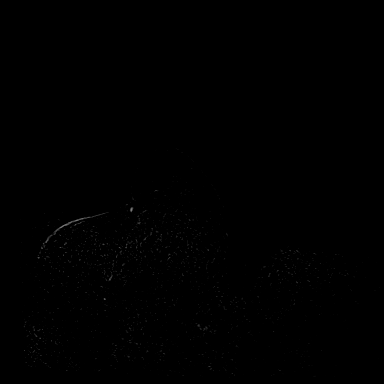
[im 144/144]
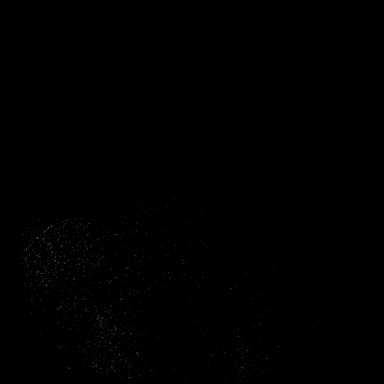

[Series 6: dynamic post 3 · axial · 1.3mm · 0.73mm/px · z∈[-103,+83]mm · 5 of 144 slices shown (1 of 2)]
[im 1/144]
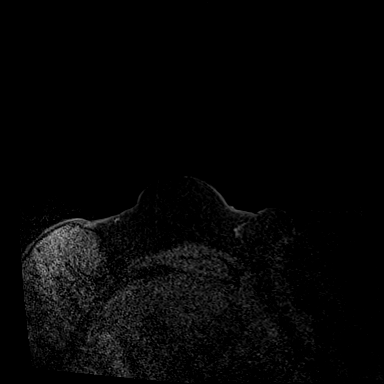
[im 36/144]
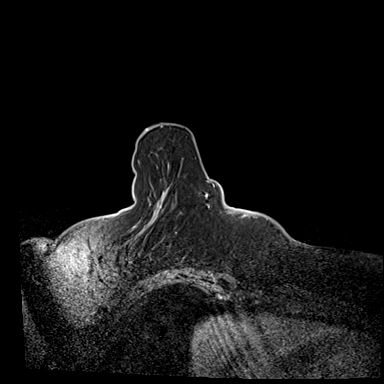
[im 72/144]
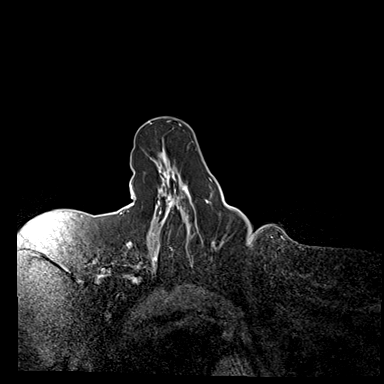
[im 108/144]
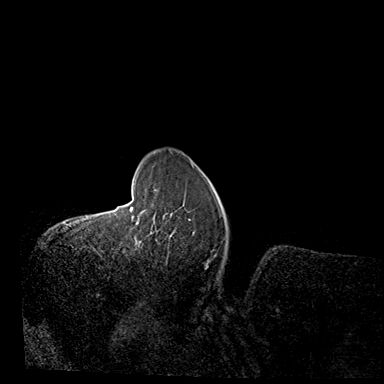
[im 144/144]
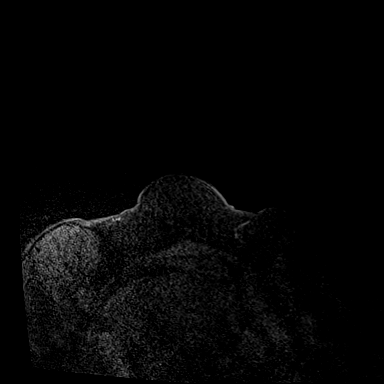

[Series 7: dynamic post 3 · axial · 1.3mm · 0.73mm/px · z∈[-103,-2]mm · 3 of 79 slices shown (2 of 2)]
[im 1/79]
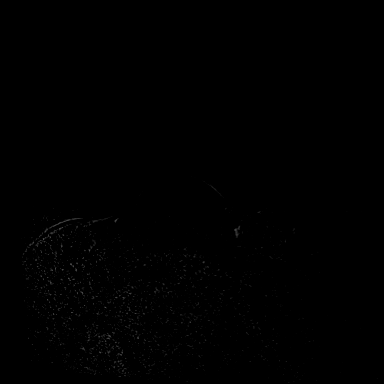
[im 40/79]
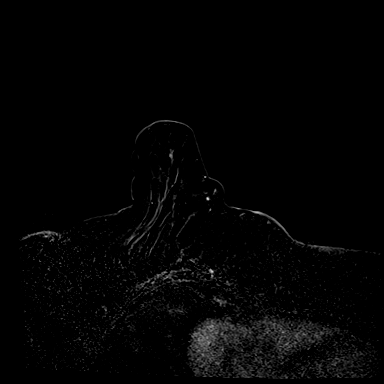
[im 79/79]
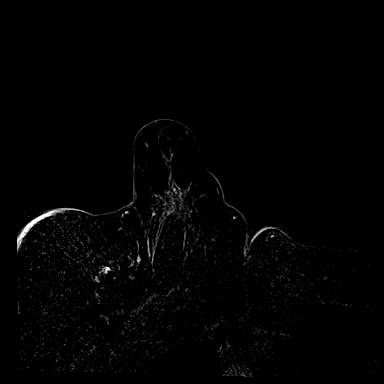

[Series 8: needle confirmation · axial · 1.3mm · 0.73mm/px · z∈[-103,+83]mm · 5 of 144 slices shown]
[im 1/144]
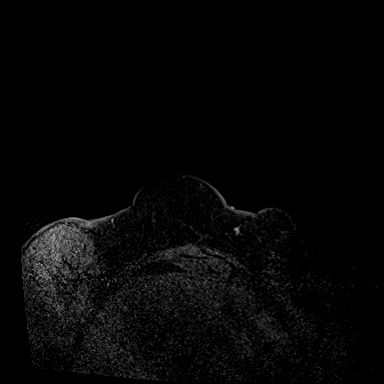
[im 36/144]
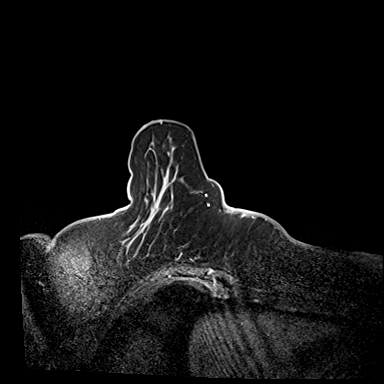
[im 72/144]
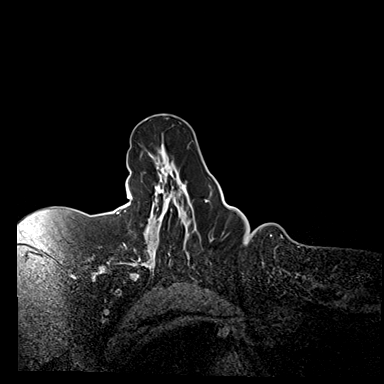
[im 108/144]
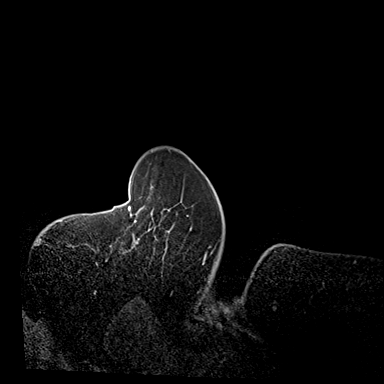
[im 144/144]
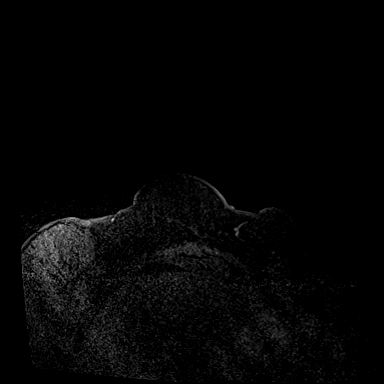

[33 of 48 positions shown; findings below may reference images not displayed]

FINDINGS: I met with the patient, and we discussed the procedure of MRI guided
biopsy, including risks, benefits, and alternatives. Specifically,
we discussed the risks of infection, bleeding, tissue injury, clip
migration, and inadequate sampling. Informed, written consent was
given. The usual time out protocol was performed immediately prior
to the procedure.

Using sterile technique, 1% Lidocaine, MRI guidance, and a 9 gauge
vacuum assisted device, biopsy was performed of linear non mass
enhancement in the retroareolar region of the RIGHT breast using a
LATERAL approach. At the conclusion of the procedure, a barbell
tissue marker clip was deployed into the biopsy cavity. Follow-up
2-view mammogram was performed and dictated separately.
IMPRESSION: MRI guided biopsy of RIGHT breast enhancement. No apparent
complications.

ADDENDUM:
Pathology revealed BENIGN BREAST WITH FIBROCYSTIC CHANGES INCLUDING
CYSTIC DILATATION OF DUCTS, SMALL BENIGN FIBROADENOMA, ABUNDANT
MATURE ADIPOSE of the RIGHT breast, retroareolar, (barbell clip).
This was found to be concordant by Dr. AURA.

Pathology results were discussed with the patient by telephone. The
patient reported doing well after the biopsy with tenderness at the
site. Post biopsy instructions and care were reviewed and questions
were answered. The patient was encouraged to call The [REDACTED] for any additional concerns. My direct phone
number was provided.

Surgical consultation for further evaluation of RIGHT nipple
discharge has been arranged with Dr. AURA at [REDACTED] on [DATE].

Pathology results reported by AURA, RN on [DATE].

*** End of Addendum ***
FINDINGS: I met with the patient, and we discussed the procedure of MRI guided
biopsy, including risks, benefits, and alternatives. Specifically,
we discussed the risks of infection, bleeding, tissue injury, clip
migration, and inadequate sampling. Informed, written consent was
given. The usual time out protocol was performed immediately prior
to the procedure.

Using sterile technique, 1% Lidocaine, MRI guidance, and a 9 gauge
vacuum assisted device, biopsy was performed of linear non mass
enhancement in the retroareolar region of the RIGHT breast using a
LATERAL approach. At the conclusion of the procedure, a barbell
tissue marker clip was deployed into the biopsy cavity. Follow-up
2-view mammogram was performed and dictated separately.
IMPRESSION: MRI guided biopsy of RIGHT breast enhancement. No apparent
complications.

## 2021-07-14 MED ORDER — GADOBUTROL 1 MMOL/ML IV SOLN
10.0000 mL | Freq: Once | INTRAVENOUS | Status: AC | PRN
  Administered 2021-07-14: 10 mL via INTRAVENOUS

## 2021-07-19 ENCOUNTER — Encounter (INDEPENDENT_AMBULATORY_CARE_PROVIDER_SITE_OTHER): Payer: Self-pay

## 2021-07-19 ENCOUNTER — Telehealth (INDEPENDENT_AMBULATORY_CARE_PROVIDER_SITE_OTHER): Payer: Self-pay | Admitting: Family Medicine

## 2021-07-19 NOTE — Telephone Encounter (Signed)
Dr. Leafy Ro - Prior authorization was approved for [facility]. Effective:07/14/2021 -07/15/2022 Patient sent approval message via mychart.

## 2021-07-26 ENCOUNTER — Other Ambulatory Visit: Payer: Self-pay | Admitting: Surgery

## 2021-07-26 NOTE — Progress Notes (Signed)
Chief Complaint:   OBESITY Monica Clark is here to discuss her progress with her obesity treatment plan along with follow-up of her obesity related diagnoses. Monica Clark is on keeping a food journal and adhering to recommended goals of 1400 calories and 80+ grams of protein daily and states she is following her eating plan approximately 75% of the time. Monica Clark states she is doing 0 minutes 0 times per week.  Today's visit was #: 42 Starting weight: 216 lbs Starting date: 04/16/2018 Today's weight: 203 lbs Today's date: 07/13/2021 Total lbs lost to date: 13 Total lbs lost since last in-office visit: 1  Interim History: Monica Clark continues to do well with weight loss. She has had increased cravings in the afternoons, which has worsen with increased stress.   Subjective:   1. Type 2 diabetes mellitus with other specified complication, with long-term current use of insulin (HCC) Monica Clark's A1c has greatly improved. She continues to work on her diet but notes increased cravings for simple carbohydrates. I discussed labs with the patient today.   2. Vitamin D deficiency Monica Clark is off Vitamin D, and her level has decreased. She notes fatigue. I discussed labs with the patient today.   3. Hypokalemia Monica Clark has a new diagnosis. Her K+ is low, and she is on Triamterene-HCTZ. She denies palpitations. I discussed labs with the patient today.   4. At risk for heart disease Monica Clark is at higher than average risk for cardiovascular disease due to obesity.  Assessment/Plan:   1. Type 2 diabetes mellitus with other specified complication, with long-term current use of insulin (HCC) We will refill Ozempic 2 mg q week for 1 month, and we will work on the prior authorization for [employer] 15 mg q week, with no refills.   - Semaglutide, 2 MG/DOSE, 8 MG/3ML SOPN; Inject 2 mg as directed once a week.  Dispense: 9 mL; Refill: 0 - tirzepatide (MOUNJARO) 15 MG/0.5ML Pen; Inject 15 mg into the skin once a week.  Dispense: 6  mL; Refill: 0  2. Vitamin D deficiency We will refill prescription Vitamin D for 1 month. Monica Clark will follow-up for routine testing of Vitamin D, at least 2-3 times per year to avoid over-replacement.  - Vitamin D, Ergocalciferol, (DRISDOL) 1.25 MG (50000 UNIT) CAPS capsule; Take 1 capsule (50,000 Units total) by mouth every 7 (seven) days.  Dispense: 4 capsule; Refill: 0  3. Hypokalemia We will check labs today, and we will follow up at Monica Clark's next office visit.   - CMP14+EGFR  4. At risk for heart disease Monica Clark was given approximately 15 minutes of coronary artery disease prevention counseling today. She is 66 y.o. female and has risk factors for heart disease including obesity. We discussed intensive lifestyle modifications today with an emphasis on specific weight loss instructions and strategies.  Repetitive spaced learning was employed today to elicit superior memory formation and behavioral change.   5. Obesity, Current BMI 36.0 Monica Clark is currently in the action stage of change. As such, her goal is to continue with weight loss efforts. She has agreed to keeping a food journal and adhering to recommended goals of 1400 calories and 80+ grams of protein daily.   Behavioral modification strategies: emotional eating strategies.  Lera has agreed to follow-up with our clinic in 3 to 4 weeks. She was informed of the importance of frequent follow-up visits to maximize her success with intensive lifestyle modifications for her multiple health conditions.   Monica Clark was informed we would discuss her lab  results at her next visit unless there is a critical issue that needs to be addressed sooner. Monica Clark agreed to keep her next visit at the agreed upon time to discuss these results.  Objective:   Blood pressure 116/72, pulse 63, temperature 98 F (36.7 C), height $RemoveBe'5\' 3"'dYmWVLkfp$  (1.6 m), weight 203 lb (92.1 kg), SpO2 99 %. Body mass index is 35.96 kg/m.  General: Cooperative, alert, well developed, in  no acute distress. HEENT: Conjunctivae and lids unremarkable. Cardiovascular: Regular rhythm.  Lungs: Normal work of breathing. Neurologic: No focal deficits.   Lab Results  Component Value Date   CREATININE 0.85 07/13/2021   BUN 11 07/13/2021   NA 143 07/13/2021   K 3.8 07/13/2021   CL 102 07/13/2021   CO2 26 07/13/2021   Lab Results  Component Value Date   ALT 11 07/13/2021   AST 13 07/13/2021   ALKPHOS 89 07/13/2021   BILITOT 0.3 07/13/2021   Lab Results  Component Value Date   HGBA1C 6.9 (H) 06/21/2021   HGBA1C 9.0 (H) 02/08/2021   HGBA1C 11.0 (H) 06/28/2020   HGBA1C 6.4 (A) 08/05/2019   HGBA1C 7.5 (H) 12/05/2018   Lab Results  Component Value Date   INSULIN 27.9 (H) 06/28/2020   INSULIN 8.7 04/16/2018   Lab Results  Component Value Date   TSH 2.220 04/16/2018   Lab Results  Component Value Date   CHOL 178 06/21/2021   HDL 31.30 (L) 06/21/2021   LDLCALC 121 (H) 02/08/2021   LDLDIRECT 110.0 06/21/2021   TRIG 230.0 (H) 06/21/2021   CHOLHDL 6 06/21/2021   Lab Results  Component Value Date   VD25OH 14.85 (L) 06/21/2021   VD25OH 24.7 (L) 02/08/2021   VD25OH 12.6 (L) 06/28/2020   Lab Results  Component Value Date   WBC 9.1 01/08/2020   HGB 13.5 01/08/2020   HCT 41.5 01/08/2020   MCV 92.2 01/08/2020   PLT 373 01/08/2020   No results found for: IRON, TIBC, FERRITIN  Attestation Statements:   Reviewed by clinician on day of visit: allergies, medications, problem list, medical history, surgical history, family history, social history, and previous encounter notes.   I, Trixie Dredge, am acting as transcriptionist for Dennard Nip, MD.  I have reviewed the above documentation for accuracy and completeness, and I agree with the above. -  Dennard Nip, MD

## 2021-08-02 ENCOUNTER — Telehealth: Payer: Self-pay | Admitting: Genetic Counselor

## 2021-08-02 NOTE — Telephone Encounter (Signed)
Scheduled appt per 6/13 referral. Pt is aware of appt date and time. Pt is aware to arrive 15 mins prior to appt time and to bring and updated insurance card. Pt is aware of appt location.   

## 2021-08-08 ENCOUNTER — Encounter (INDEPENDENT_AMBULATORY_CARE_PROVIDER_SITE_OTHER): Payer: Self-pay | Admitting: Family Medicine

## 2021-08-08 ENCOUNTER — Ambulatory Visit (INDEPENDENT_AMBULATORY_CARE_PROVIDER_SITE_OTHER): Payer: BC Managed Care – PPO | Admitting: Family Medicine

## 2021-08-08 ENCOUNTER — Encounter: Payer: Self-pay | Admitting: Family Medicine

## 2021-08-08 VITALS — BP 127/71 | HR 65 | Temp 97.6°F | Ht 63.0 in | Wt 202.0 lb

## 2021-08-08 DIAGNOSIS — E669 Obesity, unspecified: Secondary | ICD-10-CM | POA: Diagnosis not present

## 2021-08-08 DIAGNOSIS — Z6835 Body mass index (BMI) 35.0-35.9, adult: Secondary | ICD-10-CM | POA: Diagnosis not present

## 2021-08-08 DIAGNOSIS — Z7985 Long-term (current) use of injectable non-insulin antidiabetic drugs: Secondary | ICD-10-CM | POA: Diagnosis not present

## 2021-08-08 DIAGNOSIS — E1169 Type 2 diabetes mellitus with other specified complication: Secondary | ICD-10-CM | POA: Diagnosis not present

## 2021-08-08 DIAGNOSIS — Z794 Long term (current) use of insulin: Secondary | ICD-10-CM

## 2021-08-09 NOTE — Progress Notes (Signed)
Chief Complaint:   OBESITY Monica Clark is here to discuss her progress with her obesity treatment plan along with follow-up of her obesity related diagnoses. Monica Clark is on keeping a food journal and adhering to recommended goals of 1400 calories and 80+ grams of protein daily and states she is following her eating plan approximately 80% of the time. Monica Clark states she is doing 0 minutes 0 times per week.  Today's visit was #: 30 Starting weight: 216 lbs Starting date: 04/16/2018 Today's weight: 202 lbs Today's date: 08/08/2021 Total lbs lost to date: 14 Total lbs lost since last in-office visit: 1  Interim History: Monica Clark has been struggling with meal planning. She has tried to decrease simple carbohydrates, but her protein is not at goal and her RMR is likely decreasing.  Subjective:   1. Type 2 diabetes mellitus with other specified complication, with long-term current use of insulin (HCC) Monica Clark is working on her diet and exercise.  We changed her to [facility], but she is finishing up her other GLP-1 first.  Assessment/Plan:   1. Type 2 diabetes mellitus with other specified complication, with long-term current use of insulin (HCC) Monica Clark is to change to [facility] this week, and we will follow up at her next visit.  2. Obesity, Current BMI 35.8 Monica Clark is currently in the action stage of change. As such, her goal is to continue with weight loss efforts. She has agreed to keeping a food journal and adhering to recommended goals of 1400 calories and 80+ grams of protein daily.   "Real food" protein smoothie recipes were given. High protein snack handout was given.  Behavioral modification strategies: increasing lean protein intake and meal planning and cooking strategies.  Monica Clark has agreed to follow-up with our clinic in 4 weeks. She was informed of the importance of frequent follow-up visits to maximize her success with intensive lifestyle modifications for her multiple health conditions.    Objective:   Blood pressure 127/71, pulse 65, temperature 97.6 F (36.4 C), height '5\' 3"'$  (1.6 m), weight 202 lb (91.6 kg), SpO2 99 %. Body mass index is 35.78 kg/m.  General: Cooperative, alert, well developed, in no acute distress. HEENT: Conjunctivae and lids unremarkable. Cardiovascular: Regular rhythm.  Lungs: Normal work of breathing. Neurologic: No focal deficits.   Lab Results  Component Value Date   CREATININE 0.85 07/13/2021   BUN 11 07/13/2021   NA 143 07/13/2021   K 3.8 07/13/2021   CL 102 07/13/2021   CO2 26 07/13/2021   Lab Results  Component Value Date   ALT 11 07/13/2021   AST 13 07/13/2021   ALKPHOS 89 07/13/2021   BILITOT 0.3 07/13/2021   Lab Results  Component Value Date   HGBA1C 6.9 (H) 06/21/2021   HGBA1C 9.0 (H) 02/08/2021   HGBA1C 11.0 (H) 06/28/2020   HGBA1C 6.4 (A) 08/05/2019   HGBA1C 7.5 (H) 12/05/2018   Lab Results  Component Value Date   INSULIN 27.9 (H) 06/28/2020   INSULIN 8.7 04/16/2018   Lab Results  Component Value Date   TSH 2.220 04/16/2018   Lab Results  Component Value Date   CHOL 178 06/21/2021   HDL 31.30 (L) 06/21/2021   LDLCALC 121 (H) 02/08/2021   LDLDIRECT 110.0 06/21/2021   TRIG 230.0 (H) 06/21/2021   CHOLHDL 6 06/21/2021   Lab Results  Component Value Date   VD25OH 14.85 (L) 06/21/2021   VD25OH 24.7 (L) 02/08/2021   VD25OH 12.6 (L) 06/28/2020   Lab Results  Component Value Date   WBC 9.1 01/08/2020   HGB 13.5 01/08/2020   HCT 41.5 01/08/2020   MCV 92.2 01/08/2020   PLT 373 01/08/2020   No results found for: "IRON", "TIBC", "FERRITIN"  Attestation Statements:   Reviewed by clinician on day of visit: allergies, medications, problem list, medical history, surgical history, family history, social history, and previous encounter notes.  Time spent on visit including pre-visit chart review and post-visit care and charting was 30 minutes.   I, Trixie Dredge, am acting as transcriptionist for Dennard Nip, MD.  I have reviewed the above documentation for accuracy and completeness, and I agree with the above. -  Dennard Nip, MD

## 2021-08-17 ENCOUNTER — Telehealth: Payer: Self-pay | Admitting: Family Medicine

## 2021-08-17 NOTE — Telephone Encounter (Signed)
Yes

## 2021-08-17 NOTE — Telephone Encounter (Signed)
done

## 2021-08-17 NOTE — Telephone Encounter (Signed)
Pt returned call because she was called to reschedule CPE in July due to provider being off, next availbel is not until November. Can she be worked in sooner? Call back is [phone]

## 2021-08-22 ENCOUNTER — Telehealth (INDEPENDENT_AMBULATORY_CARE_PROVIDER_SITE_OTHER): Payer: Self-pay | Admitting: Family Medicine

## 2021-08-22 ENCOUNTER — Encounter: Payer: Self-pay | Admitting: Family Medicine

## 2021-08-22 ENCOUNTER — Other Ambulatory Visit (INDEPENDENT_AMBULATORY_CARE_PROVIDER_SITE_OTHER): Payer: Self-pay

## 2021-08-22 ENCOUNTER — Ambulatory Visit (INDEPENDENT_AMBULATORY_CARE_PROVIDER_SITE_OTHER): Payer: BC Managed Care – PPO | Admitting: Family Medicine

## 2021-08-22 VITALS — BP 130/76 | HR 73 | Temp 98.3°F | Ht 63.5 in | Wt 211.5 lb

## 2021-08-22 DIAGNOSIS — E559 Vitamin D deficiency, unspecified: Secondary | ICD-10-CM

## 2021-08-22 DIAGNOSIS — Z0000 Encounter for general adult medical examination without abnormal findings: Secondary | ICD-10-CM | POA: Diagnosis not present

## 2021-08-22 DIAGNOSIS — E785 Hyperlipidemia, unspecified: Secondary | ICD-10-CM

## 2021-08-22 DIAGNOSIS — E1169 Type 2 diabetes mellitus with other specified complication: Secondary | ICD-10-CM

## 2021-08-22 DIAGNOSIS — N6099 Unspecified benign mammary dysplasia of unspecified breast: Secondary | ICD-10-CM

## 2021-08-22 DIAGNOSIS — Z23 Encounter for immunization: Secondary | ICD-10-CM

## 2021-08-22 DIAGNOSIS — Z8542 Personal history of malignant neoplasm of other parts of uterus: Secondary | ICD-10-CM | POA: Diagnosis not present

## 2021-08-22 DIAGNOSIS — Z794 Long term (current) use of insulin: Secondary | ICD-10-CM

## 2021-08-22 DIAGNOSIS — E1159 Type 2 diabetes mellitus with other circulatory complications: Secondary | ICD-10-CM

## 2021-08-22 DIAGNOSIS — Z8041 Family history of malignant neoplasm of ovary: Secondary | ICD-10-CM

## 2021-08-22 DIAGNOSIS — I152 Hypertension secondary to endocrine disorders: Secondary | ICD-10-CM

## 2021-08-22 DIAGNOSIS — Z6838 Body mass index (BMI) 38.0-38.9, adult: Secondary | ICD-10-CM

## 2021-08-22 LAB — HM DIABETES FOOT EXAM

## 2021-08-22 MED ORDER — TIRZEPATIDE 15 MG/0.5ML ~~LOC~~ SOAJ: 15.0000 mg | SUBCUTANEOUS | 0 refills | Status: DC

## 2021-08-22 NOTE — Telephone Encounter (Signed)
Left msg for pt.

## 2021-08-22 NOTE — Telephone Encounter (Signed)
pt called concerning prescription prescribed by Dr. Leafy Ro. pt did not give name of medication. Please call number on file.

## 2021-08-22 NOTE — Assessment & Plan Note (Signed)
Chronic, reviewed labs in detail with patient.  Discussed ways to increase HDL and lower triglycerides and LDL.  She will continue Crestor 5 mg daily and will work on trying to be more consistent with taking this medication.

## 2021-08-22 NOTE — Assessment & Plan Note (Signed)
Stable, chronic.  Continue current medication.    

## 2021-08-22 NOTE — Assessment & Plan Note (Signed)
Very low and she is currently status post 3 weeks of vitamin D supplementation.  She is trying to be more routine and compliant with taking this medication.  She continues to feel extremely fatigued and may need further work-up for fatigue.

## 2021-08-22 NOTE — Assessment & Plan Note (Signed)
Reviewed data in detail regarding whether Pap smear is needed and endometrial cancer.  It appears no Pap smears needed but yearly visual exam of cervical cuff if cervix is no longer remaining should be done.  Given discussed this in detail but given her mother's recent diagnosis of ovarian cancer as well as multiple other family members with cancer I have suggested we consult gynecology for the best recommendations on how to follow these areas.  She states she was released by Dr. Denman George following her endometrial cancer, but had previously been followed by a different GYN who is no longer local.

## 2021-08-22 NOTE — Patient Instructions (Addendum)
Work on regular  exercise.  Increase veggie fats in place of animal fats.  Return when ready for Shingrix vaccine series with nurse visit.  We will work on setting up a referral to a gynecologist to receive help on determining if pap smear needed or other follow up given personal history of endometrial cancer and family history of ovarian cancer. Make follow up appt if back pain continuing.   : We do not perform vaginal cytology in endometrial cancer survivors; this is consistent with the [employer] "choosing wisely" guidelines, which recommend against routine vaginal cytology in endometrial cancer surveillance [6]. Data suggest vaginal smears are ineffective in the detection of recurrences compared with physical examination alone [4,7,8]. For example, in a retrospective study of patients with endometrial carcinoma (n = 433), all of whom had undergone hysterectomy, 13 percent (n = 55) had at least one abnormal result, representing three percent of all pap tests performed [7]. Abnormal vaginal cytology had a sensitivity of 40 percent and a specificity of 88 percent for detecting a vaginal recurrence.

## 2021-08-22 NOTE — Assessment & Plan Note (Signed)
Followed by [facility].  She is planning to change from semaglutide to [facility] within the next week.

## 2021-08-22 NOTE — Assessment & Plan Note (Signed)
Recurrent followed closely by Dr. Ninfa Linden.  Potentially will need further resection

## 2021-08-22 NOTE — Progress Notes (Signed)
Patient ID: Monica Clark, female    DOB: [birth year], 66 y.o.   MRN: [id number]  This visit was conducted in person.  BP 130/76   Pulse 73   Temp 98.3 F (36.8 C) (Oral)   Ht 5' 3.5" (1.613 m)   Wt 211 lb 8 oz (95.9 kg)   SpO2 96%   BMI 36.88 kg/m    CC:  Chief Complaint  Patient presents with   Annual Exam    Subjective:   HPI: Monica Clark is a 66 y.o. female presenting on 08/22/2021 for Annual Exam  Diabetes:  good control on jardiance 25 mg daily, Mounjaro 15 mg weekly, metfomrin 500 mg daily Lab Results  Component Value Date   HGBA1C 6.9 (H) 06/21/2021  Using medications without difficulties: Hypoglycemic episodes:none Hyperglycemic episodes: none Feet problems: no  ulcers Blood Sugars averaging: FBS 102-120,  eye exam within last year: uptodate.Marland Kitchen upcoming cataract surgery.  Hypertension:   At goal on Maxide  BP Readings from Last 3 Encounters:  08/22/21 130/76  08/08/21 127/71  07/13/21 116/72  Using medication without problems or lightheadedness:  none Chest pain with exertion:none Edema:none Short of breath:none Average home BPs: Other issues:  Elevated Cholesterol:  LDL not at goal < 100 on crestor 5 mg daily.  She forgets to take it regularly.  Lab Results  Component Value Date   CHOL 178 06/21/2021   HDL 31.30 (L) 06/21/2021   LDLCALC 121 (H) 02/08/2021   LDLDIRECT 110.0 06/21/2021   TRIG 230.0 (H) 06/21/2021   CHOLHDL 6 06/21/2021  Using medications without problems: Muscle aches:  Diet compliance: moderate Exercise: minimal Other complaints:   [facility] Reviewed last OV note from them 08/08/2021.Marland Kitchen On Mounjaro now.. titrating up Wt Readings from Last 3 Encounters:  08/22/21 211 lb 8 oz (95.9 kg)  08/08/21 202 lb (91.6 kg)  07/13/21 203 lb (92.1 kg)     Vit D def: she has been taking supplement more regularly in the last 3 week.  Wakes with low back pain on right in low back  Using Advil 200 mg 2  tab daily... does not help much.  Per pt had an MRI last year.      Relevant past medical, surgical, family and social history reviewed and updated as indicated. Interim medical history since our last visit reviewed. Allergies and medications reviewed and updated. Outpatient Medications Prior to Visit  Medication Sig Dispense Refill   empagliflozin (JARDIANCE) 25 MG TABS tablet Take 1 tablet (25 mg total) by mouth daily. 30 tablet 0   glucose blood (ONETOUCH VERIO) test strip 1 each by Other route as needed for other. Use to check blood sugar 2 times a day 100 each 0   Insulin Pen Needle 31G X 5 MM MISC Use once a day 100 each 1   metFORMIN (GLUCOPHAGE-XR) 500 MG 24 hr tablet Take 500 mg by mouth daily with breakfast.     rosuvastatin (CRESTOR) 5 MG tablet TAKE 1 TABLET BY MOUTH EVERY DAY 90 tablet 1   Semaglutide, 2 MG/DOSE, 8 MG/3ML SOPN Inject 2 mg as directed once a week. 9 mL 0   tirzepatide (MOUNJARO) 15 MG/0.5ML Pen Inject 15 mg into the skin once a week. (Patient not taking: Reported on 08/22/2021) 6 mL 0   TRESIBA FLEXTOUCH 100 UNIT/ML FlexTouch Pen START WITH 20 UNITS EVERY MORNING AND ADJUST AS DIRECTED. MAX 40 UNITS DAILY REPLACING SOLIQUA 15 mL 3  triamterene-hydrochlorothiazide (MAXZIDE-25) 37.5-25 MG tablet Take 1 tablet by mouth every morning. Reported on 09/03/2015 30 tablet 0   Vitamin D, Ergocalciferol, (DRISDOL) 1.25 MG (50000 UNIT) CAPS capsule Take 1 capsule (50,000 Units total) by mouth every 7 (seven) days. 4 capsule 0   No facility-administered medications prior to visit.     Per HPI unless specifically indicated in ROS section below Review of Systems  Constitutional:  Negative for fatigue and fever.  HENT:  Negative for congestion.   Eyes:  Negative for pain.  Respiratory:  Negative for cough and shortness of breath.   Cardiovascular:  Negative for chest pain, palpitations and leg swelling.  Gastrointestinal:  Negative for abdominal pain.  Genitourinary:   Negative for dysuria and vaginal bleeding.  Musculoskeletal:  Negative for back pain.  Neurological:  Negative for syncope, light-headedness and headaches.  Psychiatric/Behavioral:  Negative for dysphoric mood.    Objective:  BP 130/76   Pulse 73   Temp 98.3 F (36.8 C) (Oral)   Ht 5' 3.5" (1.613 m)   Wt 211 lb 8 oz (95.9 kg)   SpO2 96%   BMI 36.88 kg/m   Wt Readings from Last 3 Encounters:  08/22/21 211 lb 8 oz (95.9 kg)  08/08/21 202 lb (91.6 kg)  07/13/21 203 lb (92.1 kg)      Physical Exam Vitals and nursing note reviewed.  Constitutional:      General: She is not in acute distress.    Appearance: Normal appearance. She is well-developed. She is not ill-appearing or toxic-appearing.  HENT:     Head: Normocephalic.     Right Ear: Hearing, tympanic membrane, ear canal and external ear normal.     Left Ear: Hearing, tympanic membrane, ear canal and external ear normal.     Nose: Nose normal.  Eyes:     General: Lids are normal. Lids are everted, no foreign bodies appreciated.     Conjunctiva/sclera: Conjunctivae normal.     Pupils: Pupils are equal, round, and reactive to light.  Neck:     Thyroid: No thyroid mass or thyromegaly.     Vascular: No carotid bruit.     Trachea: Trachea normal.  Cardiovascular:     Rate and Rhythm: Normal rate and regular rhythm.     Heart sounds: Normal heart sounds, S1 normal and S2 normal. No murmur heard.    No gallop.  Pulmonary:     Effort: Pulmonary effort is normal. No respiratory distress.     Breath sounds: Normal breath sounds. No wheezing, rhonchi or rales.  Abdominal:     General: Bowel sounds are normal. There is no distension or abdominal bruit.     Palpations: Abdomen is soft. There is no fluid wave or mass.     Tenderness: There is no abdominal tenderness. There is no guarding or rebound.     Hernia: No hernia is present.  Musculoskeletal:     Cervical back: Normal range of motion and neck supple.  Lymphadenopathy:      Cervical: No cervical adenopathy.  Skin:    General: Skin is warm and dry.     Findings: No rash.  Neurological:     Mental Status: She is alert.     Cranial Nerves: No cranial nerve deficit.     Sensory: No sensory deficit.  Psychiatric:        Mood and Affect: Mood is not anxious or depressed.        Speech: Speech normal.  Behavior: Behavior normal. Behavior is cooperative.        Judgment: Judgment normal.       Diabetic foot exam: Normal inspection No skin breakdown No calluses  Normal DP pulses Normal sensation to light touch and monofilament Nails normal  Results for orders placed or performed in visit on 07/13/21  CMP14+EGFR  Result Value Ref Range   Glucose 94 70 - 99 mg/dL   BUN 11 8 - 27 mg/dL   Creatinine, Ser 0.85 0.57 - 1.00 mg/dL   eGFR 76 >59 mL/min/1.73   BUN/Creatinine Ratio 13 12 - 28   Sodium 143 134 - 144 mmol/L   Potassium 3.8 3.5 - 5.2 mmol/L   Chloride 102 96 - 106 mmol/L   CO2 26 20 - 29 mmol/L   Calcium 9.5 8.7 - 10.3 mg/dL   Total Protein 7.6 6.0 - 8.5 g/dL   Albumin 4.3 3.8 - 4.8 g/dL   Globulin, Total 3.3 1.5 - 4.5 g/dL   Albumin/Globulin Ratio 1.3 1.2 - 2.2   Bilirubin Total 0.3 0.0 - 1.2 mg/dL   Alkaline Phosphatase 89 44 - 121 IU/L   AST 13 0 - 40 IU/L   ALT 11 0 - 32 IU/L     COVID 19 screen:  No recent travel or known exposure to COVID19 The patient denies respiratory symptoms of COVID 19 at this time. The importance of social distancing was discussed today.   Assessment and Plan The patient's preventative maintenance and recommended screening tests for an annual wellness exam were reviewed in full today. Brought up to date unless services declined.  Counselled on the importance of diet, exercise, and its role in overall health and mortality. The patient's FH and SH was reviewed, including their home life, tobacco status, and drug and alcohol status.    Vaccines: Shingrix due, Tdap due and COVID 19 #4 to be  considered. Pap/DVE: S/P hysterectomy,  for endometrial caner stage 1 still has p Mammo: 05/24/2021... breast leakage.. followed by Dr. Rada Hay Density:  plan age 41.  Colon: 10/19/20 Smoking Status: ETOH/ drug use: none./none  Hep C:  due  HIV screen:   refused  Problem List Items Addressed This Visit     Atypical ductal hyperplasia of breast    Recurrent followed closely by Dr. Ninfa Linden.  Potentially will need further resection      Class 2 severe obesity with serious comorbidity and body mass index (BMI) of 38.0 to 38.9 in adult [facility])    [facility].  She is planning to change from semaglutide to [facility] within the next week.      History of endometrial cancer - Primary    Reviewed data in detail regarding whether Pap smear is needed and endometrial cancer.  It appears no Pap smears needed but yearly visual exam of cervical cuff if cervix is no longer remaining should be done.  Given discussed this in detail but given her mother's recent diagnosis of ovarian cancer as well as multiple other family members with cancer I have suggested we consult gynecology for the best recommendations on how to follow these areas.  She states she was released by Dr. Denman George following her endometrial cancer, but had previously been followed by a different GYN who is no longer local.       Relevant Orders   Ambulatory referral to Gynecology   Hyperlipidemia associated with type 2 diabetes mellitus ([place])    Chronic, reviewed labs in detail with  patient.  Discussed ways to increase HDL and lower triglycerides and LDL.  She will continue Crestor 5 mg daily and will work on trying to be more consistent with taking this medication.      Hypertension associated with type 2 diabetes mellitus (HCC)    Stable, chronic.  Continue current medication.         Vitamin D deficiency    Very low and she is currently status post 3 weeks of vitamin D supplementation.  She is  trying to be more routine and compliant with taking this medication.  She continues to feel extremely fatigued and may need further work-up for fatigue.      Other Visit Diagnoses     Family history of ovarian cancer       Relevant Orders   Ambulatory referral to Gynecology   Need for Td vaccine       Relevant Orders   Td : Tetanus/diphtheria >7yo Preservative  free (Completed)        Eliezer Lofts, MD

## 2021-08-24 ENCOUNTER — Other Ambulatory Visit (INDEPENDENT_AMBULATORY_CARE_PROVIDER_SITE_OTHER): Payer: Self-pay | Admitting: Family Medicine

## 2021-08-24 DIAGNOSIS — E559 Vitamin D deficiency, unspecified: Secondary | ICD-10-CM

## 2021-09-01 ENCOUNTER — Other Ambulatory Visit: Payer: BC Managed Care – PPO

## 2021-09-05 ENCOUNTER — Ambulatory Visit (INDEPENDENT_AMBULATORY_CARE_PROVIDER_SITE_OTHER): Payer: BC Managed Care – PPO | Admitting: Family Medicine

## 2021-09-06 ENCOUNTER — Inpatient Hospital Stay: Payer: BC Managed Care – PPO | Attending: Nurse Practitioner | Admitting: Genetic Counselor

## 2021-09-06 ENCOUNTER — Other Ambulatory Visit: Payer: Self-pay | Admitting: Genetic Counselor

## 2021-09-06 ENCOUNTER — Encounter: Payer: Self-pay | Admitting: Genetic Counselor

## 2021-09-06 ENCOUNTER — Inpatient Hospital Stay: Payer: BC Managed Care – PPO

## 2021-09-06 ENCOUNTER — Other Ambulatory Visit: Payer: Self-pay

## 2021-09-06 DIAGNOSIS — Z8542 Personal history of malignant neoplasm of other parts of uterus: Secondary | ICD-10-CM

## 2021-09-06 DIAGNOSIS — Z1379 Encounter for other screening for genetic and chromosomal anomalies: Secondary | ICD-10-CM

## 2021-09-06 LAB — GENETIC SCREENING ORDER

## 2021-09-06 NOTE — Progress Notes (Signed)
REFERRING PROVIDER: Coralie Keens, MD [address] [place],  [place] [address]  PRIMARY PROVIDER:  Jinny Sanders, MD  PRIMARY REASON FOR VISIT:  1. History of endometrial cancer    HISTORY OF PRESENT ILLNESS:   Ms. Timm, a 66 y.o. female, was seen for a [place] cancer genetics consultation at the request of Dr. Ninfa Linden due to a personal and family history of cancer.  Ms. Bloch presents to clinic today to discuss the possibility of a hereditary predisposition to cancer, to discuss genetic testing, and to further clarify her future cancer risks, as well as potential cancer risks for family members.   [name] has a history of endometrial cancer diagnosed at age 62. She also has a history of ADH diagnosed at age 22.  CANCER HISTORY:  Oncology History   No history exists.    RISK FACTORS:  Menarche was at age 37-10.  First live birth at age 27.  Ovaries intact: no.  Uterus intact: no.  Menopausal status: postmenopausal.  HRT use: 0 years. Colonoscopy: yes; normal. Mammogram within the last year: yes. Any excessive radiation exposure in the past: no  Past Medical History:  Diagnosis Date   Breast mass, left    duct mass   Depression    Endometrial hyperplasia    Fuchs' corneal dystrophy    Gallbladder problem    Heart murmur    states no known problems, has never seen a cardiologist   Hypertension    states under control with med., has been on med. x 5 yr.   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[place])    Nipple discharge 04/2014   left breast   Non-insulin dependent type 2 diabetes mellitus [facility])     Past Surgical History:  Procedure Laterality Date   BREAST BIOPSY Left 04/30/2014   Procedure: LEFT BREAST BIOPSY;  Surgeon: Jackolyn Confer, MD;  Location: [facility];  Service: General;  Laterality: Left;   BREAST DUCTAL SYSTEM EXCISION Left 04/30/2014   Procedure: LEFT NIPPLE DUCT  EXCISION;  Surgeon: Jackolyn Confer, MD;  Location: [facility];  Service: General;  Laterality: Left;   [place]   ROBOTIC ASSISTED TOTAL HYSTERECTOMY WITH BILATERAL SALPINGO OOPHERECTOMY Bilateral 10/13/2014   Procedure: ROBOTIC ASSISTED TOTAL HYSTERECTOMY WITH BILATERAL SALPINGO OOPHORECTOMY ;  Surgeon: Everitt Amber, MD;  Location: [facility];  Service: Gynecology;  Laterality: Bilateral;    Social History   Socioeconomic History   Marital status: Single    Spouse name: Not on file   Number of children: 3   Years of education: Not on file   Highest education level: Not on file  Occupational History   Not on file  Tobacco Use   Smoking status: Never   Smokeless tobacco: Never  Substance and Sexual Activity   Alcohol use: Yes    Comment: rarely   Drug use: No   Sexual activity: Not on file  Other Topics Concern   Not on file  Social History Narrative    She works for [employer], planning retirement   Social Determinants of Radio broadcast assistant Strain: Not on file  Food Insecurity: Not on file  Transportation Needs: Not on file  Physical Activity: Not on file  Stress: Not on file  Social Connections: Not on file     FAMILY HISTORY:  We obtained a detailed, 4-generation  family history.  Significant diagnoses are listed below: Family History  Problem Relation Age of Onset   Diabetes Mother    Obesity Mother    Ovarian cancer Mother 28   Heart disease Father    Throat cancer Father 38 - 87   Sudden death Father    Alcoholism Father    Diabetes Sister    Diabetes Brother    Breast cancer Maternal Aunt    Prostate cancer Maternal Grandfather 33 - 89       metastatic   Colon cancer Maternal Grandfather    Breast cancer Cousin 66       maternal first cousin   Breast cancer Cousin        3 paternal first cousins     Ms. Kegg mother was diagnosed with ovarian cancer at age 68 and died shortly after her  diagnosis. Her maternal aunt was diagnosed with breast and lung cancer. This aunt's daughter (Ms. Bhalla's cousin) was diagnosed with breast cancer at age 10. Her maternal grandfather was diagnosed with colon cancer at an unknown age and prostate cancer in his 95s, he died in his 45s due to metastatic prostate cancer. Ms. Schroeck father was diagnosed with throat cancer in his 82s, he smoked, he died due to a MI in his 26s. She has three paternal cousins diagnosed with breast cancer. Ms. Hover is unaware of previous family history of genetic testing for hereditary cancer risks. There is no reported Ashkenazi Jewish ancestry.  GENETIC COUNSELING ASSESSMENT: Ms. Kobrin is a 66 y.o. female with a personal and family history of cancer which is somewhat suggestive of a hereditary predisposition to cancer. We, therefore, discussed and recommended the following at today's visit.   DISCUSSION: We discussed that 5 - 10% of cancer is hereditary, with most cases of uterine cancer associated with Lynch Syndrome.  There are other genes that can be associated with hereditary uterine cancer syndromes.  We discussed that testing is beneficial for several reasons including knowing how to follow individuals after completing their treatment and understanding if other family members could be at risk for cancer.  We reviewed the characteristics, features and inheritance patterns of hereditary cancer syndromes. We also discussed genetic testing, including the appropriate family members to test, the process of testing, insurance coverage and turn-around-time for results. We discussed the implications of a negative, positive, carrier and/or variant of uncertain significant result.   Ms. Sigman  was offered a common hereditary cancer panel (47 genes) and an expanded pan-cancer panel (77 genes). Ms. Sandner was informed of the benefits and limitations of each panel, including that expanded pan-cancer panels contain genes that  do not have clear management guidelines at this point in time.  We also discussed that as the number of genes included on a panel increases, the chances of variants of uncertain significance increases.  After considering the benefits and limitations of each gene panel, Ms. Hollings elected to have [facility] CancerNext-Expanded Panel.  Based on Ms. Funaro's personal and family history of cancer, she meets medical criteria for genetic testing. Despite that she meets criteria, she may still have an out of pocket cost. We discussed that if her out of pocket cost for testing is over $100, the laboratory will call and confirm whether she wants to proceed with testing.  If the out of pocket cost of testing is less than $100 she will be billed by the genetic testing laboratory.   PLAN: After considering the risks, benefits, and limitations, Ms.  Adriance provided informed consent to pursue genetic testing and the blood sample was sent to [facility] for analysis of the CancerNext-Expanded Panel. Results should be available within approximately 2-3 weeks' time, at which point they will be disclosed by telephone to Ms. Siple, as will any additional recommendations warranted by these results. Ms. Vigeant will receive a summary of her genetic counseling visit and a copy of her results once available. This information will also be available in Epic.   Ms. Kasperski questions were answered to her satisfaction today. Our contact information was provided should additional questions or concerns arise. Thank you for the referral and allowing [place] to share in the care of your patient.   Lucille Passy, MS, [facility] Genetic Counselor [email or web address] (P) [phone]  The patient was seen for a total of 40 minutes in face-to-face genetic counseling. The patient was seen alone.  Drs. Lindi Adie and/or Burr Medico were available to discuss this case as needed.    _______________________________________________________________________ For Office Staff:  Number of people involved in session: 1 Was an Intern/ student involved with case: no

## 2021-09-08 ENCOUNTER — Encounter: Payer: BC Managed Care – PPO | Admitting: Family Medicine

## 2021-09-22 ENCOUNTER — Other Ambulatory Visit (INDEPENDENT_AMBULATORY_CARE_PROVIDER_SITE_OTHER): Payer: Self-pay | Admitting: Family Medicine

## 2021-09-22 DIAGNOSIS — E1169 Type 2 diabetes mellitus with other specified complication: Secondary | ICD-10-CM

## 2021-09-27 ENCOUNTER — Encounter: Payer: Self-pay | Admitting: Genetic Counselor

## 2021-09-27 ENCOUNTER — Telehealth: Payer: Self-pay | Admitting: Genetic Counselor

## 2021-09-27 DIAGNOSIS — Z1379 Encounter for other screening for genetic and chromosomal anomalies: Secondary | ICD-10-CM | POA: Insufficient documentation

## 2021-09-27 NOTE — Telephone Encounter (Signed)
I contacted Ms. Furnish to discuss her genetic testing results. No pathogenic variants were identified in the 77 genes analyzed. Detailed clinic note to follow.  The test report has been scanned into EPIC and is located under the Molecular Pathology section of the Results Review tab.  A portion of the result report is included below for reference.   Lucille Passy, MS, [facility] Genetic Counselor [email or web address] (P) [phone]

## 2021-09-28 ENCOUNTER — Encounter (INDEPENDENT_AMBULATORY_CARE_PROVIDER_SITE_OTHER): Payer: Self-pay

## 2021-10-03 ENCOUNTER — Ambulatory Visit (INDEPENDENT_AMBULATORY_CARE_PROVIDER_SITE_OTHER): Payer: BC Managed Care – PPO | Admitting: Family Medicine

## 2021-10-04 ENCOUNTER — Telehealth (INDEPENDENT_AMBULATORY_CARE_PROVIDER_SITE_OTHER): Payer: Self-pay | Admitting: Family Medicine

## 2021-10-04 NOTE — Telephone Encounter (Signed)
Left message to call back  

## 2021-10-04 NOTE — Telephone Encounter (Signed)
Monica Clark needs Dr. Migdalia Dk assistant to call her concerning her [facility] prescription.

## 2021-10-05 ENCOUNTER — Encounter (INDEPENDENT_AMBULATORY_CARE_PROVIDER_SITE_OTHER): Payer: Self-pay | Admitting: Family Medicine

## 2021-10-05 ENCOUNTER — Encounter: Payer: Self-pay | Admitting: Genetic Counselor

## 2021-10-06 ENCOUNTER — Encounter (INDEPENDENT_AMBULATORY_CARE_PROVIDER_SITE_OTHER): Payer: Self-pay | Admitting: Family Medicine

## 2021-10-06 ENCOUNTER — Ambulatory Visit (INDEPENDENT_AMBULATORY_CARE_PROVIDER_SITE_OTHER): Payer: BC Managed Care – PPO | Admitting: Family Medicine

## 2021-10-06 VITALS — BP 145/73 | HR 87 | Temp 97.9°F | Ht 63.0 in | Wt 208.0 lb

## 2021-10-06 DIAGNOSIS — E669 Obesity, unspecified: Secondary | ICD-10-CM | POA: Diagnosis not present

## 2021-10-06 DIAGNOSIS — E1169 Type 2 diabetes mellitus with other specified complication: Secondary | ICD-10-CM

## 2021-10-06 DIAGNOSIS — E119 Type 2 diabetes mellitus without complications: Secondary | ICD-10-CM | POA: Insufficient documentation

## 2021-10-06 DIAGNOSIS — Z794 Long term (current) use of insulin: Secondary | ICD-10-CM

## 2021-10-06 DIAGNOSIS — E7849 Other hyperlipidemia: Secondary | ICD-10-CM

## 2021-10-06 DIAGNOSIS — E559 Vitamin D deficiency, unspecified: Secondary | ICD-10-CM

## 2021-10-06 DIAGNOSIS — Z6836 Body mass index (BMI) 36.0-36.9, adult: Secondary | ICD-10-CM

## 2021-10-06 DIAGNOSIS — E782 Mixed hyperlipidemia: Secondary | ICD-10-CM | POA: Insufficient documentation

## 2021-10-06 DIAGNOSIS — Z7985 Long-term (current) use of injectable non-insulin antidiabetic drugs: Secondary | ICD-10-CM

## 2021-10-06 MED ORDER — VITAMIN D (ERGOCALCIFEROL) 1.25 MG (50000 UNIT) PO CAPS: 50000.0000 [IU] | ORAL_CAPSULE | ORAL | 0 refills | Status: DC

## 2021-10-06 MED ORDER — TIRZEPATIDE 15 MG/0.5ML ~~LOC~~ SOAJ: 15.0000 mg | SUBCUTANEOUS | 0 refills | Status: DC

## 2021-10-06 MED ORDER — EMPAGLIFLOZIN 25 MG PO TABS: 25.0000 mg | ORAL_TABLET | Freq: Every day | ORAL | 0 refills | Status: DC

## 2021-10-06 MED ORDER — ROSUVASTATIN CALCIUM 5 MG PO TABS: 5.0000 mg | ORAL_TABLET | Freq: Every day | ORAL | 0 refills | Status: DC

## 2021-10-17 NOTE — Progress Notes (Unsigned)
Chief Complaint:   OBESITY Monica Clark is here to discuss her progress with her obesity treatment plan along with follow-up of her obesity related diagnoses. Monica Clark is on keeping a food journal and adhering to recommended goals of 1400 calories and 80+ grams of protein daily and states she is following her eating plan approximately 70% of the time. Monica Clark states she is doing 0 minutes 0 times per week.  Today's visit was #: 17 Starting weight: 216 lbs Starting date: 04/16/2018 Today's weight: 208 lbs Today's date: 10/06/2021 Total lbs lost to date: 8 Total lbs lost since last in-office visit: 0  Interim History: Monica Clark's last visit was 2 months ago. She has had a lot of family and work stress.   Subjective:   1. Other hyperlipidemia Monica Clark is on Crestor, and she is working on her diet. She is due for labs soon.   2. Type 2 diabetes mellitus with other specified complication, with long-term current use of insulin (HCC) Monica Clark has been out of Ozempic for 2 weeks. She was changed from [facility] due to poor insurance coverage.   3. Vitamin D deficiency Monica Clark is on Vitamin D, but her last level is still low. She notes fatigue.   Assessment/Plan:   1. Other hyperlipidemia Monica Clark will continue Crestor 5 mg once daily, and we will refill for 1 month. We will recheck labs in 8 weeks.   - rosuvastatin (CRESTOR) 5 MG tablet; Take 1 tablet (5 mg total) by mouth daily.  Dispense: 30 tablet; Refill: 0  2. Type 2 diabetes mellitus with other specified complication, with long-term current use of insulin (HCC) Monica Clark will continue her medications, and we will refill Ozempic and Jardiance for 1 month. We will recheck labs in 8 weeks.   - tirzepatide (MOUNJARO) 15 MG/0.5ML Pen; Inject 15 mg into the skin once a week.  Dispense: 6 mL; Refill: 0 - empagliflozin (JARDIANCE) 25 MG TABS tablet; Take 1 tablet (25 mg total) by mouth daily.  Dispense: 30 tablet; Refill: 0  3. Vitamin D deficiency Monica Clark will  continue prescription Vitamin D 50,000 IU once weekly, and we will refill for 1 month.  We will recheck labs in 8 weeks.   - Vitamin D, Ergocalciferol, (DRISDOL) 1.25 MG (50000 UNIT) CAPS capsule; Take 1 capsule (50,000 Units total) by mouth every 7 (seven) days.  Dispense: 4 capsule; Refill: 0  4. Obesity, Current BMI 36.9 Monica Clark is currently in the action stage of change. As such, her goal is to continue with weight loss efforts. She has agreed to keeping a food journal and adhering to recommended goals of 1400 calories and 80+ grams of protein daily.   Eating Out handout was given.   Behavioral modification strategies: increasing lean protein intake, meal planning and cooking strategies, and emotional eating strategies.  Monica Clark has agreed to follow-up with our clinic in 4 weeks. She was informed of the importance of frequent follow-up visits to maximize her success with intensive lifestyle modifications for her multiple health conditions.   Objective:   Blood pressure (!) 145/73, pulse 87, temperature 97.9 F (36.6 C), height '5\' 3"'$  (1.6 m), weight 208 lb (94.3 kg), SpO2 100 %. Body mass index is 36.85 kg/m.  General: Cooperative, alert, well developed, in no acute distress. HEENT: Conjunctivae and lids unremarkable. Cardiovascular: Regular rhythm.  Lungs: Normal work of breathing. Neurologic: No focal deficits.   Lab Results  Component Value Date   CREATININE 0.85 07/13/2021   BUN 11 07/13/2021  NA 143 07/13/2021   K 3.8 07/13/2021   CL 102 07/13/2021   CO2 26 07/13/2021   Lab Results  Component Value Date   ALT 11 07/13/2021   AST 13 07/13/2021   ALKPHOS 89 07/13/2021   BILITOT 0.3 07/13/2021   Lab Results  Component Value Date   HGBA1C 6.9 (H) 06/21/2021   HGBA1C 9.0 (H) 02/08/2021   HGBA1C 11.0 (H) 06/28/2020   HGBA1C 6.4 (A) 08/05/2019   HGBA1C 7.5 (H) 12/05/2018   Lab Results  Component Value Date   INSULIN 27.9 (H) 06/28/2020   INSULIN 8.7 04/16/2018    Lab Results  Component Value Date   TSH 2.220 04/16/2018   Lab Results  Component Value Date   CHOL 178 06/21/2021   HDL 31.30 (L) 06/21/2021   LDLCALC 121 (H) 02/08/2021   LDLDIRECT 110.0 06/21/2021   TRIG 230.0 (H) 06/21/2021   CHOLHDL 6 06/21/2021   Lab Results  Component Value Date   VD25OH 14.85 (L) 06/21/2021   VD25OH 24.7 (L) 02/08/2021   VD25OH 12.6 (L) 06/28/2020   Lab Results  Component Value Date   WBC 9.1 01/08/2020   HGB 13.5 01/08/2020   HCT 41.5 01/08/2020   MCV 92.2 01/08/2020   PLT 373 01/08/2020   No results found for: "IRON", "TIBC", "FERRITIN"  Attestation Statements:   Reviewed by clinician on day of visit: allergies, medications, problem list, medical history, surgical history, family history, social history, and previous encounter notes.   I, Trixie Dredge, am acting as transcriptionist for Dennard Nip, MD.  I have reviewed the above documentation for accuracy and completeness, and I agree with the above. -  Dennard Nip, MD

## 2021-10-19 ENCOUNTER — Ambulatory Visit (INDEPENDENT_AMBULATORY_CARE_PROVIDER_SITE_OTHER): Payer: BC Managed Care – PPO | Admitting: Family Medicine

## 2021-10-26 ENCOUNTER — Ambulatory Visit: Payer: Self-pay | Admitting: Genetic Counselor

## 2021-10-26 ENCOUNTER — Encounter: Payer: Self-pay | Admitting: Genetic Counselor

## 2021-10-26 NOTE — Progress Notes (Signed)
HPI:   [name] was previously seen in the [place] clinic due to a personal and family history of cancer and concerns regarding a hereditary predisposition to cancer. Please refer to our prior cancer genetics clinic note for more information regarding our discussion, assessment and recommendations, at the time. [name] recent genetic test results were disclosed to her, as were recommendations warranted by these results. These results and recommendations are discussed in more detail below.  CANCER HISTORY:  Oncology History   No history exists.    FAMILY HISTORY:  We obtained a detailed, 4-generation family history.  Significant diagnoses are listed below:      Family History  Problem Relation Age of Onset   Diabetes Mother     Obesity Mother     Ovarian cancer Mother 58   Heart disease Father     Throat cancer Father 71 - 36   Sudden death Father     Alcoholism Father     Diabetes Sister     Diabetes Brother     Breast cancer Maternal Aunt     Prostate cancer Maternal Grandfather 11 - 89        metastatic   Colon cancer Maternal Grandfather     Breast cancer Cousin 64        maternal first cousin   Breast cancer Cousin          3 paternal first cousins       [name] mother was diagnosed with ovarian cancer at age 78 and died shortly after her diagnosis. Her maternal aunt was diagnosed with breast and lung cancer. This aunt's daughter ([name]'s cousin) was diagnosed with breast cancer at age 55. Her maternal grandfather was diagnosed with colon cancer at an unknown age and prostate cancer in his 61s, he died in his 2s due to metastatic prostate cancer. [name] father was diagnosed with throat cancer in his 86s, he smoked, he died due to a MI in his 64s. She has three paternal cousins diagnosed with breast cancer. [name] is unaware of previous family history of genetic testing for hereditary cancer risks. There is no reported  Ashkenazi Jewish ancestry.    GENETIC TEST RESULTS:  The [facility] CancerNext-Expanded Panel found no pathogenic mutations.   The CancerNext-Expanded gene panel offered by [facility] and includes sequencing, rearrangement, and RNA analysis for the following 77 genes: AIP, ALK, APC, ATM, AXIN2, BAP1, BARD1, BLM, BMPR1A, BRCA1, BRCA2, BRIP1, CDC73, CDH1, CDK4, CDKN1B, CDKN2A, CHEK2, CTNNA1, DICER1, FANCC, FH, FLCN, GALNT12, KIF1B, LZTR1, MAX, MEN1, MET, MLH1, MSH2, MSH3, MSH6, MUTYH, NBN, NF1, NF2, NTHL1, PALB2, PHOX2B, PMS2, POT1, PRKAR1A, PTCH1, PTEN, RAD51C, RAD51D, RB1, RECQL, RET, SDHA, SDHAF2, SDHB, SDHC, SDHD, SMAD4, SMARCA4, SMARCB1, SMARCE1, STK11, SUFU, TMEM127, TP53, TSC1, TSC2, VHL and XRCC2 (sequencing and deletion/duplication); EGFR, EGLN1, HOXB13, KIT, MITF, PDGFRA, POLD1, and POLE (sequencing only); EPCAM and GREM1 (deletion/duplication only).    The test report has been scanned into EPIC and is located under the Molecular Pathology section of the Results Review tab.  A portion of the result report is included below for reference. Genetic testing reported out on 09/27/2021.       Even though a pathogenic variant was not identified, possible explanations for the cancer in the family may include: There may be no hereditary risk for cancer in the family. The cancers in [name] and/or her family may be due to other genetic or environmental factors. There may be a gene  mutation in one of these genes that current testing methods cannot detect, but that chance is small. There could be another gene that has not yet been discovered, or that we have not yet tested, that is responsible for the cancer diagnoses in the family.  It is also possible there is a hereditary cause for the cancer in the family that [name] did not inherit.  Therefore, it is important to remain in touch with cancer genetics in the future so that we can continue to offer [name] the most up to date genetic  testing.   ADDITIONAL GENETIC TESTING:  We discussed with [name] that her genetic testing was fairly extensive.  If there are genes identified to increase cancer risk that can be analyzed in the future, we would be happy to discuss and coordinate this testing at that time.    CANCER SCREENING RECOMMENDATIONS:  [name] test result is considered negative (normal).  This means that we have not identified a hereditary cause for her personal and family history of cancer at this time.   An individual's cancer risk and medical management are not determined by genetic test results alone. Overall cancer risk assessment incorporates additional factors, including personal medical history, family history, and any available genetic information that may result in a personalized plan for cancer prevention and surveillance. Therefore, it is recommended she continue to follow the cancer management and screening guidelines provided by her oncology and primary healthcare provider.  Based on the reported personal and family history, specific cancer screenings for [name] and her family include:  Breast Cancer Screening:  Multiple prediction models have been developed to assist with breast cancer risk prediction efforts for unaffected women without a known single gene risk factor identified in their family. The Tyrer-Cuzick model is one such risk assessment tool. This model was developed to include extensive family history information, endogenous estrogen exposure, and benign breast disease. The calculation is highly-dependent on the accuracy of clinical data provided by the patient. Other factors not accounted for in the calculation may impact lifetime breast cancer risk including, but not limited to, germline mutations not analyzed by the ordered genetic test or clinical information not provided at the time of the testing. The risk number provided is patient-specific and cannot be used to infer risk  to relatives. [name] Tyrer-Cuzick risk score is 9.1%.   RECOMMENDATIONS FOR FAMILY MEMBERS:   Since she did not inherit a mutation in a cancer predisposition gene included on this panel, her children could not have inherited a mutation from her in one of these genes. Individuals in this family might be at some increased risk of developing cancer, over the general population risk, due to the family history of cancer. We recommend women in this family have a yearly mammogram beginning at age 67, or 35 years younger than the earliest onset of cancer, an annual clinical breast exam, and perform monthly breast self-exams. Other members of the family may still carry a pathogenic variant in one of these genes that Ms. Trang did not inherit. Based on the family history, we recommend individuals in the family with a personal history of cancer have genetic counseling and testing.   FOLLOW-UP:  Cancer genetics is a rapidly advancing field and it is possible that new genetic tests will be appropriate for her and/or her family members in the future. We encouraged her to remain in contact with cancer genetics on an annual basis so we can update  her personal and family histories and let her know of advances in cancer genetics that may benefit this family.   Our contact number was provided. Ms. Hetzer questions were answered to her satisfaction, and she knows she is welcome to call us at anytime with additional questions or concerns.   Monica Passy, MS, [facility] Genetic Counselor [email or web address] (P) [phone]

## 2021-11-01 ENCOUNTER — Other Ambulatory Visit (INDEPENDENT_AMBULATORY_CARE_PROVIDER_SITE_OTHER): Payer: Self-pay | Admitting: Family Medicine

## 2021-11-01 DIAGNOSIS — E7849 Other hyperlipidemia: Secondary | ICD-10-CM

## 2021-11-02 ENCOUNTER — Ambulatory Visit: Payer: BC Managed Care – PPO | Admitting: Family Medicine

## 2021-11-02 ENCOUNTER — Encounter: Payer: BC Managed Care – PPO | Admitting: Family Medicine

## 2021-11-02 ENCOUNTER — Encounter: Payer: Self-pay | Admitting: Family Medicine

## 2021-11-02 NOTE — Progress Notes (Signed)
Patient did not keep appointment today. She may call to reschedule.  

## 2021-11-02 NOTE — Progress Notes (Deleted)
TeleHealth Visit:  This visit was completed with telemedicine (audio/video) technology. Monica Clark has verbally consented to this TeleHealth visit. The patient is located at home, the provider is located at home. The participants in this visit include the listed provider and patient. The visit was conducted today via MyChart video.  OBESITY Monica Clark is here to discuss her progress with her obesity treatment plan along with follow-up of her obesity related diagnoses.   Today's visit was # 4 Starting weight: 216 lbs Starting date: 04/16/2018 Weight at last in office visit: 208 lbs on 10/06/21 Total weight loss: 8 lbs at last in office visit on 10/06/21. Today's reported weight: *** lbs No weight reported.  Nutrition Plan: keeping a food journal and adhering to recommended goals of 1400 calories and 80+ gms protein.  Hunger is {EWCONTROLASSESSMENT:24261}. Cravings are {EWCONTROLASSESSMENT:24261}.  Current exercise: {exercise types:16438}  Interim History: ***  Assessment/Plan:  1. Type II Diabetes HgbA1c {ACTION; IS/IS NOT:21021397} at goal. Last A1c was *** CBGs: Fasting ***, 2 hour PP *** Not checking Episodes of hypoglycemia: {yes***/no:17258} Medication(s):   Lab Results  Component Value Date   HGBA1C 6.9 (H) 06/21/2021   HGBA1C 9.0 (H) 02/08/2021   HGBA1C 11.0 (H) 06/28/2020   Lab Results  Component Value Date   MICROALBUR 0.9 08/16/2020   LDLCALC 121 (H) 02/08/2021   CREATININE 0.85 07/13/2021    Plan: ***   2. ***  3. ***  Obesity: Current BMI *** [place] {CHL AMB IS/IS NOT:210130109} currently in the action stage of change. As such, her goal is to {MWMwtloss#1:210800005}.  She has agreed to {MWMwtlossportion/plan2:23431}.   Exercise goals: {MWM EXERCISE RECS:23473}  Behavioral modification strategies: {MWMwtlossdietstrategies3:23432}.  Brett has agreed to follow-up with our clinic in {NUMBER 1-10:22536} weeks.   No orders of the defined types were placed  in this encounter.   There are no discontinued medications.   No orders of the defined types were placed in this encounter.     Objective:   VITALS: Per patient if applicable, see vitals. GENERAL: Alert and in no acute distress. CARDIOPULMONARY: No increased WOB. Speaking in clear sentences.  PSYCH: Pleasant and cooperative. Speech normal rate and rhythm. Affect is appropriate. Insight and judgement are appropriate. Attention is focused, linear, and appropriate.  NEURO: Oriented as arrived to appointment on time with no prompting.   Lab Results  Component Value Date   CREATININE 0.85 07/13/2021   BUN 11 07/13/2021   NA 143 07/13/2021   K 3.8 07/13/2021   CL 102 07/13/2021   CO2 26 07/13/2021   Lab Results  Component Value Date   ALT 11 07/13/2021   AST 13 07/13/2021   ALKPHOS 89 07/13/2021   BILITOT 0.3 07/13/2021   Lab Results  Component Value Date   HGBA1C 6.9 (H) 06/21/2021   HGBA1C 9.0 (H) 02/08/2021   HGBA1C 11.0 (H) 06/28/2020   HGBA1C 6.4 (A) 08/05/2019   HGBA1C 7.5 (H) 12/05/2018   Lab Results  Component Value Date   INSULIN 27.9 (H) 06/28/2020   INSULIN 8.7 04/16/2018   Lab Results  Component Value Date   TSH 2.220 04/16/2018   Lab Results  Component Value Date   CHOL 178 06/21/2021   HDL 31.30 (L) 06/21/2021   LDLCALC 121 (H) 02/08/2021   LDLDIRECT 110.0 06/21/2021   TRIG 230.0 (H) 06/21/2021   CHOLHDL 6 06/21/2021   Lab Results  Component Value Date   WBC 9.1 01/08/2020   HGB 13.5 01/08/2020   HCT 41.5 01/08/2020  MCV 92.2 01/08/2020   PLT 373 01/08/2020   No results found for: "IRON", "TIBC", "FERRITIN" Lab Results  Component Value Date   VD25OH 14.85 (L) 06/21/2021   VD25OH 24.7 (L) 02/08/2021   VD25OH 12.6 (L) 06/28/2020    Attestation Statements:   Reviewed by clinician on day of visit: allergies, medications, problem list, medical history, surgical history, family history, social history, and previous encounter  notes.  ***(delete if time-based billing not used) Time spent on visit including the items listed below was *** minutes.  -preparing to see the patient (e.g., review of tests, history, previous notes) -obtaining and/or reviewing separately obtained history -counseling and educating the patient/family/caregiver -documenting clinical information in the electronic or other health record -ordering medications, tests, or procedures -independently interpreting results and communicating results to the patient/ family/caregiver -referring and communicating with other health care professionals  -care coordination

## 2021-11-03 ENCOUNTER — Telehealth (INDEPENDENT_AMBULATORY_CARE_PROVIDER_SITE_OTHER): Payer: BC Managed Care – PPO | Admitting: Family Medicine

## 2021-11-03 ENCOUNTER — Telehealth (INDEPENDENT_AMBULATORY_CARE_PROVIDER_SITE_OTHER): Payer: Self-pay | Admitting: Family Medicine

## 2021-11-03 NOTE — Telephone Encounter (Signed)
Patient was a no-show for virtual visit. Left VM advising patient to call to reschedule.  

## 2021-11-04 ENCOUNTER — Ambulatory Visit (INDEPENDENT_AMBULATORY_CARE_PROVIDER_SITE_OTHER)
Admission: RE | Admit: 2021-11-04 | Discharge: 2021-11-04 | Disposition: A | Discharge status: Home / Self Care | Payer: BC Managed Care – PPO | Source: Ambulatory Visit | Attending: Family Medicine | Admitting: Family Medicine

## 2021-11-04 ENCOUNTER — Encounter: Payer: Self-pay | Admitting: Family Medicine

## 2021-11-04 ENCOUNTER — Ambulatory Visit: Payer: BC Managed Care – PPO | Admitting: Family Medicine

## 2021-11-04 VITALS — BP 106/62 | HR 77 | Temp 98.2°F | Ht 63.0 in | Wt 213.0 lb

## 2021-11-04 DIAGNOSIS — M7918 Myalgia, other site: Secondary | ICD-10-CM | POA: Diagnosis not present

## 2021-11-04 DIAGNOSIS — M25511 Pain in right shoulder: Secondary | ICD-10-CM

## 2021-11-04 DIAGNOSIS — M1611 Unilateral primary osteoarthritis, right hip: Secondary | ICD-10-CM | POA: Diagnosis not present

## 2021-11-04 DIAGNOSIS — G8929 Other chronic pain: Secondary | ICD-10-CM | POA: Insufficient documentation

## 2021-11-04 HISTORY — DX: Unilateral primary osteoarthritis, right hip: M16.11

## 2021-11-04 NOTE — Assessment & Plan Note (Signed)
Acute  Secondary to right hip osteoarthritis versus right lumbar back pain with sciatica.  Will evaluate spine with x-ray.  Treat with diclofenac 75 mg p.o. twice daily and home physical therapy exercises.  Can use heat.  Encouraged weight loss and core strengthening.

## 2021-11-04 NOTE — Progress Notes (Signed)
Patient ID: Monica Clark, female    DOB: July 29, 1955, 66 y.o.   MRN: [id number]  This visit was conducted in person.  BP 106/62   Pulse 77   Temp 98.2 F (36.8 C) (Oral)   Ht '5\' 3"'$  (1.6 m)   Wt 213 lb (96.6 kg)   SpO2 98%   BMI 37.73 kg/m    CC:  Chief Complaint  Patient presents with   Shoulder Pain    Right   Hip Pain    Right    Subjective:   HPI: Monica Clark is a 66 y.o. female presenting on 11/04/2021 for Shoulder Pain (Right) and Hip Pain (Right)   [place] last year in 09/2020.. landed on right shouder.  Right shoulder pain: ongoing since fall last year, gradaully worsening.    Right hip pain: Ongoing .Marland Kitchen In right buttock 6.5/10 on pain scale. Constant.  Ongoing x > 1 year, Worse with lying on that side and down steps.  Radiates to mid back.  Radiates occ to right knee.  Some weakness, no numbness Advil  Dual action 200 mg 2 tab daily    05/04/2020 Xray showed right hip OA mild to moderate.   Relevant past medical, surgical, family and social history reviewed and updated as indicated. Interim medical history since our last visit reviewed. Allergies and medications reviewed and updated. Outpatient Medications Prior to Visit  Medication Sig Dispense Refill   empagliflozin (JARDIANCE) 25 MG TABS tablet Take 1 tablet (25 mg total) by mouth daily. 30 tablet 0   glucose blood (ONETOUCH VERIO) test strip 1 each by Other route as needed for other. Use to check blood sugar 2 times a day 100 each 0   Insulin Pen Needle 31G X 5 MM MISC Use once a day 100 each 1   metFORMIN (GLUCOPHAGE-XR) 500 MG 24 hr tablet Take 500 mg by mouth daily with breakfast.     rosuvastatin (CRESTOR) 5 MG tablet Take 1 tablet (5 mg total) by mouth daily. 30 tablet 0   Semaglutide, 2 MG/DOSE, (OZEMPIC, 2 MG/DOSE,) 8 MG/3ML SOPN Inject 2 mg into the skin once a week.     TRESIBA FLEXTOUCH 100 UNIT/ML FlexTouch Pen START WITH 20 UNITS EVERY MORNING AND ADJUST AS DIRECTED. MAX 40 UNITS DAILY  REPLACING SOLIQUA 15 mL 3   triamterene-hydrochlorothiazide (MAXZIDE-25) 37.5-25 MG tablet Take 1 tablet by mouth every morning. Reported on 09/03/2015 30 tablet 0   Vitamin D, Ergocalciferol, (DRISDOL) 1.25 MG (50000 UNIT) CAPS capsule Take 1 capsule (50,000 Units total) by mouth every 7 (seven) days. 4 capsule 0   tirzepatide (MOUNJARO) 15 MG/0.5ML Pen Inject 15 mg into the skin once a week. 6 mL 0   No facility-administered medications prior to visit.     Per HPI unless specifically indicated in ROS section below Review of Systems  Constitutional:  Negative for fatigue and fever.  HENT:  Negative for congestion.   Eyes:  Negative for pain.  Respiratory:  Negative for cough and shortness of breath.   Cardiovascular:  Negative for chest pain, palpitations and leg swelling.  Gastrointestinal:  Negative for abdominal pain.  Genitourinary:  Negative for dysuria and vaginal bleeding.  Musculoskeletal:  Negative for back pain.  Neurological:  Negative for syncope, light-headedness and headaches.  Psychiatric/Behavioral:  Negative for dysphoric mood.    Objective:  BP 106/62   Pulse 77   Temp 98.2 F (36.8 C) (Oral)   Ht '5\' 3"'$  (1.6 m)  Wt 213 lb (96.6 kg)   SpO2 98%   BMI 37.73 kg/m   Wt Readings from Last 3 Encounters:  11/04/21 213 lb (96.6 kg)  10/06/21 208 lb (94.3 kg)  08/22/21 211 lb 8 oz (95.9 kg)      Physical Exam Constitutional:      General: She is not in acute distress.    Appearance: Normal appearance. She is well-developed. She is not ill-appearing or toxic-appearing.  HENT:     Head: Normocephalic.     Right Ear: Hearing, tympanic membrane, ear canal and external ear normal. Tympanic membrane is not erythematous, retracted or bulging.     Left Ear: Hearing, tympanic membrane, ear canal and external ear normal. Tympanic membrane is not erythematous, retracted or bulging.     Nose: No mucosal edema or rhinorrhea.     Right Sinus: No maxillary sinus tenderness or  frontal sinus tenderness.     Left Sinus: No maxillary sinus tenderness or frontal sinus tenderness.     Mouth/Throat:     Pharynx: Uvula midline.  Eyes:     General: Lids are normal. Lids are everted, no foreign bodies appreciated.     Conjunctiva/sclera: Conjunctivae normal.     Pupils: Pupils are equal, round, and reactive to light.  Neck:     Thyroid: No thyroid mass or thyromegaly.     Vascular: No carotid bruit.     Trachea: Trachea normal.  Cardiovascular:     Rate and Rhythm: Normal rate and regular rhythm.     Pulses: Normal pulses.     Heart sounds: Normal heart sounds, S1 normal and S2 normal. No murmur heard.    No friction rub. No gallop.  Pulmonary:     Effort: Pulmonary effort is normal. No tachypnea or respiratory distress.     Breath sounds: Normal breath sounds. No decreased breath sounds, wheezing, rhonchi or rales.  Abdominal:     General: Bowel sounds are normal.     Palpations: Abdomen is soft.     Tenderness: There is no abdominal tenderness.  Musculoskeletal:     Right shoulder: Tenderness present. Decreased range of motion.     Cervical back: Normal range of motion and neck supple.     Lumbar back: Tenderness present. No bony tenderness. Decreased range of motion. Positive right straight leg raise test. Negative left straight leg raise test.     Right hip: Decreased range of motion.     Comments: Positive empty can, negative drop arm test, negative Spurling bilaterally negative crossover test  Skin:    General: Skin is warm and dry.     Findings: No rash.  Neurological:     Mental Status: She is alert.  Psychiatric:        Mood and Affect: Mood is not anxious or depressed.        Speech: Speech normal.        Behavior: Behavior normal. Behavior is cooperative.        Thought Content: Thought content normal.        Judgment: Judgment normal.       Results for orders placed or performed in visit on 09/06/21  Genetic Screening Order  Result Value  Ref Range   Genetic Screening Order Collected by Laboratory      COVID 19 screen:  No recent travel or known exposure to [place] The patient denies respiratory symptoms of COVID 19 at this time. The importance of social distancing was discussed today.  Assessment and Plan Problem List Items Addressed This Visit     Acute pain of right shoulder    Acute, no clear sign of fracture despite fall 1 year ago.  Symptoms are most consistent with rotator cuff tendinitis.  We will start with anti-inflammatories and home physical therapy if not improving we can consider referral for Ortho/injection versus formal physical therapy.      Primary osteoarthritis of right hip   Right buttock pain - Primary    Acute  Secondary to right hip osteoarthritis versus right lumbar back pain with sciatica.  Will evaluate spine with x-ray.  Treat with diclofenac 75 mg p.o. twice daily and home physical therapy exercises.  Can use heat.  Encouraged weight loss and core strengthening.      Relevant Orders   DG Lumbar Spine Complete       Eliezer Lofts, MD

## 2021-11-04 NOTE — Assessment & Plan Note (Signed)
Acute, no clear sign of fracture despite fall 1 year ago.  Symptoms are most consistent with rotator cuff tendinitis.  We will start with anti-inflammatories and home physical therapy if not improving we can consider referral for Ortho/injection versus formal physical therapy.

## 2021-11-04 NOTE — Patient Instructions (Addendum)
We will call with x-ray results.  Stop ibuprofen.  Start diclofenac twice daily with meal  x 2 weeks.  Start low back stretches  and right shoulder,  home PT.   If not better  in 2- 4 weeks call for either Ortho referral or referral to PT.

## 2021-11-09 ENCOUNTER — Other Ambulatory Visit: Payer: Self-pay | Admitting: Family Medicine

## 2021-11-09 ENCOUNTER — Telehealth: Payer: Self-pay | Admitting: Family Medicine

## 2021-11-09 MED ORDER — DICLOFENAC SODIUM 75 MG PO TBEC: 75.0000 mg | DELAYED_RELEASE_TABLET | Freq: Two times a day (BID) | ORAL | 0 refills | Status: DC

## 2021-11-09 NOTE — Telephone Encounter (Signed)
Nazly notified as instructed by telephone.

## 2021-11-09 NOTE — Telephone Encounter (Signed)
Patient stated she was in here on Friday and an anti-inflammatory was suppose to be called in for her. She stated the pharmacy hasn't received anything and she hasn't heard anything. She was wanting to also know if you recommend getting the new Covid vaccine. Thank you!

## 2021-11-09 NOTE — Telephone Encounter (Signed)
My apologies!  Prescription has now been sent in for diclofenac 75 mg p.o. twice daily.  Yes I do recommend her getting the new COVID-vaccine.

## 2021-11-12 ENCOUNTER — Other Ambulatory Visit (INDEPENDENT_AMBULATORY_CARE_PROVIDER_SITE_OTHER): Payer: Self-pay | Admitting: Family Medicine

## 2021-11-12 DIAGNOSIS — E559 Vitamin D deficiency, unspecified: Secondary | ICD-10-CM

## 2021-11-28 ENCOUNTER — Other Ambulatory Visit: Payer: Self-pay | Admitting: Family Medicine

## 2021-11-28 NOTE — Telephone Encounter (Signed)
Last office visit 11/04/21 for Shoulder/Hip Pain.  Last refilled 11/09/2021 for #30 with no refills.  No future appointments with PCP.

## 2021-11-30 NOTE — Progress Notes (Signed)
TeleHealth Visit:  This visit was completed with telemedicine (audio/video) technology. Monica Clark has verbally consented to this TeleHealth visit. The patient is located at home, the provider is located at home. The participants in this visit include the listed provider and patient. The visit was conducted today via MyChart video.  OBESITY Monica Clark is here to discuss her progress with her obesity treatment plan along with follow-up of her obesity related diagnoses.   Today's visit was # 81 Starting weight: 216 lbs Starting date: 04/16/2018 Weight at last in office visit: 208 lbs on 10/06/21 Total weight loss: 8 lbs at last in office visit on 10/06/21. Today's reported weight: No weight reported.  Nutrition Plan: keeping a food journal and adhering to recommended goals of 1400 calories and 80 gms protein daily  Current exercise: no regular exercise.   Interim History: Monica Clark says she recently returned from vacation and she has not been doing well with journaling.  She is working to get back to this.  Her life has been very busy. She feels she has been doing well with her protein intake.   She wants to start some form of exercise-has been having trouble with one of her feet so she wants to get back to the gym and do the bike or the elliptical.  Assessment/Plan:  1. Type II Diabetes with other unspecified complication with long-term use of insulin. HgbA1c is at goal. Last A1c was 6.9 on 06/21/2021.  Diabetes is managed by Dr. Dwyane Dee but she does not have a follow-up scheduled. CBGs: Fasting <150, Not checking after meals Episodes of hypoglycemia: no Medication(s): Tresiba 20 units daily, Jardiance 25 mg daily, Ozempic 2 mg weekly, metformin XR 500 mg daily (not taking regularly). She has a question about taking berberine rather than metformin.  Lab Results  Component Value Date   HGBA1C 6.9 (H) 06/21/2021   HGBA1C 9.0 (H) 02/08/2021   HGBA1C 11.0 (H) 06/28/2020   Lab Results  Component  Value Date   MICROALBUR 0.9 08/16/2020   LDLCALC 121 (H) 02/08/2021   CREATININE 0.85 07/13/2021    Plan: Refill Ozempic 2 mg weekly. Refill Jardiance 25 mg daily. She will call to see if Darcel Bayley is affordable for her.  If so will switch to Mounjaro 7.5 or 10 mg. Discussed how metformin works and the fact that natural products and herbs are not regulated.  Advised her to begin taking metformin regularly. Check labs next office visit. Check CBG after meals 2-3 times per week. Encouraged her to make appointment with Dr. Dwyane Dee.   2. Vitamin D Deficiency Vitamin D level is very low at 14.85 on Jun 21, 2021. She is on weekly prescription Vitamin D 50,000 IU.  Lab Results  Component Value Date   VD25OH 14.85 (L) 06/21/2021   VD25OH 24.7 (L) 02/08/2021   VD25OH 12.6 (L) 06/28/2020    Plan: Continue prescription vitamin D 50,000 IU weekly. Check vitamin D level next office visit.   3. Obesity: Current BMI 36.9 Monica Clark is currently in the action stage of change. As such, her goal is to continue with weight loss efforts.  She has agreed to keeping a food journal and adhering to recommended goals of 1400 calories and 80 gms protein daily.   Exercise goals: Encouraged her to go to the gym 2-3 times weekly and do elliptical or bike and also arm and abdominal weight machines.  Behavioral modification strategies: increasing lean protein intake, decreasing simple carbohydrates, increasing vegetables, increasing water intake, planning for success, and keeping  a strict food journal.  Monica Clark has agreed to follow-up with our clinic in 4 weeks with fasting labs.   No orders of the defined types were placed in this encounter.   Medications Discontinued During This Encounter  Medication Reason   empagliflozin (JARDIANCE) 25 MG TABS tablet Reorder   Semaglutide, 2 MG/DOSE, (OZEMPIC, 2 MG/DOSE,) 8 MG/3ML SOPN Reorder     Meds ordered this encounter  Medications   Semaglutide, 2 MG/DOSE,  (OZEMPIC, 2 MG/DOSE,) 8 MG/3ML SOPN    Sig: Inject 2 mg into the skin once a week.    Dispense:  3 mL    Refill:  0    Order Specific Question:   Supervising Provider    Answer:   Dell Ponto [[id number]]   empagliflozin (JARDIANCE) 25 MG TABS tablet    Sig: Take 1 tablet (25 mg total) by mouth daily.    Dispense:  90 tablet    Refill:  0    Order Specific Question:   Supervising Provider    Answer:   Dell Ponto [[id number]]      Objective:   VITALS: Per patient if applicable, see vitals. GENERAL: Alert and in no acute distress. CARDIOPULMONARY: No increased WOB. Speaking in clear sentences.  PSYCH: Pleasant and cooperative. Speech normal rate and rhythm. Affect is appropriate. Insight and judgement are appropriate. Attention is focused, linear, and appropriate.  NEURO: Oriented as arrived to appointment on time with no prompting.   Lab Results  Component Value Date   CREATININE 0.85 07/13/2021   BUN 11 07/13/2021   NA 143 07/13/2021   K 3.8 07/13/2021   CL 102 07/13/2021   CO2 26 07/13/2021   Lab Results  Component Value Date   ALT 11 07/13/2021   AST 13 07/13/2021   ALKPHOS 89 07/13/2021   BILITOT 0.3 07/13/2021   Lab Results  Component Value Date   HGBA1C 6.9 (H) 06/21/2021   HGBA1C 9.0 (H) 02/08/2021   HGBA1C 11.0 (H) 06/28/2020   HGBA1C 6.4 (A) 08/05/2019   HGBA1C 7.5 (H) 12/05/2018   Lab Results  Component Value Date   INSULIN 27.9 (H) 06/28/2020   INSULIN 8.7 04/16/2018   Lab Results  Component Value Date   TSH 2.220 04/16/2018   Lab Results  Component Value Date   CHOL 178 06/21/2021   HDL 31.30 (L) 06/21/2021   LDLCALC 121 (H) 02/08/2021   LDLDIRECT 110.0 06/21/2021   TRIG 230.0 (H) 06/21/2021   CHOLHDL 6 06/21/2021   Lab Results  Component Value Date   WBC 9.1 01/08/2020   HGB 13.5 01/08/2020   HCT 41.5 01/08/2020   MCV 92.2 01/08/2020   PLT 373 01/08/2020   No results found for: "IRON", "TIBC", "FERRITIN" Lab Results  Component Value  Date   VD25OH 14.85 (L) 06/21/2021   VD25OH 24.7 (L) 02/08/2021   VD25OH 12.6 (L) 06/28/2020    Attestation Statements:   Reviewed by clinician on day of visit: allergies, medications, problem list, medical history, surgical history, family history, social history, and previous encounter notes.

## 2021-12-01 ENCOUNTER — Ambulatory Visit (INDEPENDENT_AMBULATORY_CARE_PROVIDER_SITE_OTHER): Payer: BC Managed Care – PPO | Admitting: Family Medicine

## 2021-12-01 ENCOUNTER — Encounter (INDEPENDENT_AMBULATORY_CARE_PROVIDER_SITE_OTHER): Payer: Self-pay | Admitting: Family Medicine

## 2021-12-01 ENCOUNTER — Telehealth (INDEPENDENT_AMBULATORY_CARE_PROVIDER_SITE_OTHER): Payer: BC Managed Care – PPO | Admitting: Family Medicine

## 2021-12-01 DIAGNOSIS — Z794 Long term (current) use of insulin: Secondary | ICD-10-CM

## 2021-12-01 DIAGNOSIS — E559 Vitamin D deficiency, unspecified: Secondary | ICD-10-CM

## 2021-12-01 DIAGNOSIS — Z7984 Long term (current) use of oral hypoglycemic drugs: Secondary | ICD-10-CM

## 2021-12-01 DIAGNOSIS — Z6836 Body mass index (BMI) 36.0-36.9, adult: Secondary | ICD-10-CM

## 2021-12-01 DIAGNOSIS — E1169 Type 2 diabetes mellitus with other specified complication: Secondary | ICD-10-CM

## 2021-12-01 DIAGNOSIS — Z7985 Long-term (current) use of injectable non-insulin antidiabetic drugs: Secondary | ICD-10-CM

## 2021-12-01 DIAGNOSIS — E669 Obesity, unspecified: Secondary | ICD-10-CM | POA: Diagnosis not present

## 2021-12-01 MED ORDER — EMPAGLIFLOZIN 25 MG PO TABS: 25.0000 mg | ORAL_TABLET | Freq: Every day | ORAL | 0 refills | Status: DC

## 2021-12-01 MED ORDER — OZEMPIC (2 MG/DOSE) 8 MG/3ML ~~LOC~~ SOPN: 2.0000 mg | PEN_INJECTOR | SUBCUTANEOUS | 0 refills | Status: DC

## 2021-12-08 ENCOUNTER — Ambulatory Visit: Payer: BC Managed Care – PPO | Admitting: Family Medicine

## 2021-12-08 ENCOUNTER — Encounter: Payer: Self-pay | Admitting: Family Medicine

## 2021-12-08 VITALS — BP 124/72 | HR 73 | Temp 98.6°F | Resp 16 | Ht 63.0 in | Wt 219.1 lb

## 2021-12-08 DIAGNOSIS — B9789 Other viral agents as the cause of diseases classified elsewhere: Secondary | ICD-10-CM | POA: Insufficient documentation

## 2021-12-08 DIAGNOSIS — J329 Chronic sinusitis, unspecified: Secondary | ICD-10-CM | POA: Diagnosis not present

## 2021-12-08 MED ORDER — DICLOFENAC SODIUM 75 MG PO TBEC: 75.0000 mg | DELAYED_RELEASE_TABLET | Freq: Two times a day (BID) | ORAL | 0 refills | Status: DC

## 2021-12-08 NOTE — Progress Notes (Signed)
Patient ID: Monica Clark, female    DOB: Dec 02, 1955, 66 y.o.   MRN: [id number]  This visit was conducted in person.  BP 124/72   Pulse 73   Temp 98.6 F (37 C)   Resp 16   Ht '5\' 3"'$  (1.6 m)   Wt 219 lb 2 oz (99.4 kg)   SpO2 98%   BMI 38.82 kg/m    CC:  Chief Complaint  Patient presents with   Cough    X 4 days    Subjective:   HPI: Monica Clark is a 66 y.o. female  with history of DMpresenting on 12/08/2021 for Cough (X 4 days)     Date of onset: 10/15  She reports initially noting nasal congestion, hoarse voice, mild ST. Symptoms progressed to chest congestion and cough.  Now noting  wheeze and mild SOB No face pain or sinus pressure.  No sneeze.  NO fever. No body aches.  Sick contacts:  COVID at work, but not direct exposure.  She has tried to treat with  Zyrtec D.   COVID  and flu vaccine.. most recent 11/11/2021  Last COVID infection 2022... never regained normal breathing.Marland Kitchen always a little more winded. No history of chronic lung disease such as COPD or asthma.  She is a non-smoker.  Relevant past medical, surgical, family and social history reviewed and updated as indicated. Interim medical history since our last visit reviewed. Allergies and medications reviewed and updated. Outpatient Medications Prior to Visit  Medication Sig Dispense Refill   diclofenac (VOLTAREN) 75 MG EC tablet TAKE 1 TABLET BY MOUTH TWICE A DAY 30 tablet 0   empagliflozin (JARDIANCE) 25 MG TABS tablet Take 1 tablet (25 mg total) by mouth daily. 90 tablet 0   glucose blood (ONETOUCH VERIO) test strip 1 each by Other route as needed for other. Use to check blood sugar 2 times a day 100 each 0   Insulin Pen Needle 31G X 5 MM MISC Use once a day 100 each 1   metFORMIN (GLUCOPHAGE-XR) 500 MG 24 hr tablet Take 500 mg by mouth daily with breakfast.     rosuvastatin (CRESTOR) 5 MG tablet Take 1 tablet (5 mg total) by mouth daily. 30 tablet 0   Semaglutide, 2 MG/DOSE, (OZEMPIC, 2  MG/DOSE,) 8 MG/3ML SOPN Inject 2 mg into the skin once a week. 3 mL 0   TRESIBA FLEXTOUCH 100 UNIT/ML FlexTouch Pen START WITH 20 UNITS EVERY MORNING AND ADJUST AS DIRECTED. MAX 40 UNITS DAILY REPLACING SOLIQUA 15 mL 3   triamterene-hydrochlorothiazide (MAXZIDE-25) 37.5-25 MG tablet Take 1 tablet by mouth every morning. Reported on 09/03/2015 30 tablet 0   Vitamin D, Ergocalciferol, (DRISDOL) 1.25 MG (50000 UNIT) CAPS capsule Take 1 capsule (50,000 Units total) by mouth every 7 (seven) days. 4 capsule 0   No facility-administered medications prior to visit.     Per HPI unless specifically indicated in ROS section below Review of Systems  Constitutional:  Negative for fatigue and fever.  HENT:  Positive for congestion.   Eyes:  Negative for pain.  Respiratory:  Negative for cough and shortness of breath.   Cardiovascular:  Negative for chest pain, palpitations and leg swelling.  Gastrointestinal:  Negative for abdominal pain.  Genitourinary:  Negative for dysuria and vaginal bleeding.  Musculoskeletal:  Negative for back pain.  Neurological:  Negative for syncope, light-headedness and headaches.  Psychiatric/Behavioral:  Negative for dysphoric mood.    Objective:  BP 124/72  Pulse 73   Temp 98.6 F (37 C)   Resp 16   Ht '5\' 3"'$  (1.6 m)   Wt 219 lb 2 oz (99.4 kg)   SpO2 98%   BMI 38.82 kg/m   Wt Readings from Last 3 Encounters:  12/08/21 219 lb 2 oz (99.4 kg)  11/04/21 213 lb (96.6 kg)  10/06/21 208 lb (94.3 kg)      Physical Exam    Results for orders placed or performed in visit on 09/06/21  Genetic Screening Order  Result Value Ref Range   Genetic Screening Order Collected by Laboratory      COVID 19 screen:  No recent travel or known exposure to COVID19 The patient denies respiratory symptoms of COVID 19 at this time. The importance of social distancing was discussed today.   Assessment and Plan    Problem List Items Addressed This Visit     Viral sinusitis -  Primary     Likely viral vs allergic sinusitis.  Negative COVID test.  Treat with Flonase 2 sprays per nostril daily, zyrtec ( avoid decongestant if BP increasing).  Can start nasal saline irrigation as well.  Go to ER if severe shortness of breath.       Meds ordered this encounter  Medications   diclofenac (VOLTAREN) 75 MG EC tablet    Sig: Take 1 tablet (75 mg total) by mouth 2 (two) times daily.    Dispense:  30 tablet    Refill:  0     Eliezer Lofts, MD

## 2021-12-08 NOTE — Assessment & Plan Note (Signed)
Likely viral vs allergic sinusitis.  Negative COVID test.  Treat with Flonase 2 sprays per nostril daily, zyrtec ( avoid decongestant if BP increasing).  Can start nasal saline irrigation as well.  Go to ER if severe shortness of breath.

## 2021-12-08 NOTE — Patient Instructions (Signed)
Likely viral vs allergic sinusitis.  Negative COVID test.  Treat with Flonase 2 sprays per nostril daily, zyrtec ( avoid decongestant if BP increasing).  Can start nasal saline irrigation as well.  Go to ER if severe shortness of breath.

## 2021-12-29 ENCOUNTER — Encounter (INDEPENDENT_AMBULATORY_CARE_PROVIDER_SITE_OTHER): Payer: Self-pay | Admitting: Family Medicine

## 2021-12-29 ENCOUNTER — Ambulatory Visit (INDEPENDENT_AMBULATORY_CARE_PROVIDER_SITE_OTHER): Payer: BC Managed Care – PPO | Admitting: Family Medicine

## 2021-12-29 VITALS — BP 130/83 | HR 66 | Temp 98.5°F | Ht 63.0 in | Wt 215.4 lb

## 2021-12-29 DIAGNOSIS — E7849 Other hyperlipidemia: Secondary | ICD-10-CM

## 2021-12-29 DIAGNOSIS — E559 Vitamin D deficiency, unspecified: Secondary | ICD-10-CM | POA: Diagnosis not present

## 2021-12-29 DIAGNOSIS — Z794 Long term (current) use of insulin: Secondary | ICD-10-CM

## 2021-12-29 DIAGNOSIS — E1169 Type 2 diabetes mellitus with other specified complication: Secondary | ICD-10-CM

## 2021-12-29 DIAGNOSIS — Z7984 Long term (current) use of oral hypoglycemic drugs: Secondary | ICD-10-CM

## 2021-12-29 DIAGNOSIS — E669 Obesity, unspecified: Secondary | ICD-10-CM

## 2021-12-29 DIAGNOSIS — Z6838 Body mass index (BMI) 38.0-38.9, adult: Secondary | ICD-10-CM

## 2021-12-29 MED ORDER — TRESIBA FLEXTOUCH 100 UNIT/ML ~~LOC~~ SOPN: PEN_INJECTOR | SUBCUTANEOUS | 3 refills | Status: DC

## 2021-12-29 MED ORDER — SEMAGLUTIDE (1 MG/DOSE) 4 MG/3ML ~~LOC~~ SOPN: 1.0000 mg | PEN_INJECTOR | SUBCUTANEOUS | 0 refills | Status: DC

## 2021-12-29 MED ORDER — VITAMIN D (ERGOCALCIFEROL) 1.25 MG (50000 UNIT) PO CAPS: 50000.0000 [IU] | ORAL_CAPSULE | ORAL | 0 refills | Status: DC

## 2021-12-29 MED ORDER — ROSUVASTATIN CALCIUM 5 MG PO TABS: 5.0000 mg | ORAL_TABLET | Freq: Every day | ORAL | 0 refills | Status: DC

## 2022-01-11 NOTE — Progress Notes (Signed)
Chief Complaint:   OBESITY Monica Clark is here to discuss her progress with her obesity treatment plan along with follow-up of her obesity related diagnoses. Monica Clark is on keeping a food journal and adhering to recommended goals of 1400 calories and 80 grams of protein daily and states she is following her eating plan approximately 70% of the time. Monica Clark states she is dike riding for 30-40 minutes 2 times per week.  Today's visit was #: 51 Starting weight: 216 lbs Starting date: 04/16/2018 Today's weight: 215 lbs Today's date: 12/29/2021 Total lbs lost to date: 1 Total lbs lost since last in-office visit: 0  Interim History: Monica Clark returned from 10 days in [place]. She was unable to obtain Ozempic for the past 4 weeks. She is getting back on track with her plan after her recent vacation.   Subjective:   1. Type 2 diabetes mellitus with other specified complication, with long-term current use of insulin (HCC) Monica Clark is on [place] and metformin, and she denies hypoglycemia or side effects. She had been on Ozempic 2 mg, but she was unable to obtain as it has been out of stock for >4 weeks. We discussed possibly obtaining a lower dose of Ozempic for now.   2. Other hyperlipidemia Monica Clark is taking Crestor with no side effects noted. Her last LDL was 110, triglycerides 230, and HDL 31.30.  3. Vitamin D deficiency Monica Clark's last Vitamin D level was 14.85. She is taking Vitamin D prescription with no side effects.   Assessment/Plan:   1. Type 2 diabetes mellitus with other specified complication, with long-term current use of insulin (HCC) We will refill Ozempic for 1 month, and we will refill [place] for 90 days. Monica Clark will follow-up with Dr. Dwyane Dee in regards to [place]. She will continue with her diet and exercise.   - Semaglutide, 1 MG/DOSE, 4 MG/3ML SOPN; Inject 1 mg as directed once a week.  Dispense: 3 mL; Refill: 0 - insulin degludec (TRESIBA FLEXTOUCH) 100 UNIT/ML FlexTouch Pen; As directed  20 units in am.  Dispense: 15 mL; Refill: 3  2. Other hyperlipidemia We will refill Crestor for 1 month, and we will recheck labs at her next visit. Monica Clark will continue with her diet and exercise.   - rosuvastatin (CRESTOR) 5 MG tablet; Take 1 tablet (5 mg total) by mouth daily.  Dispense: 30 tablet; Refill: 0  3. Vitamin D deficiency We will refill prescription Vitamin D for 1 month. We will recheck labs at her next visit. Monica Clark will follow-up for routine testing of Vitamin D, at least 2-3 times per year to avoid over-replacement.  - Vitamin D, Ergocalciferol, (DRISDOL) 1.25 MG (50000 UNIT) CAPS capsule; Take 1 capsule (50,000 Units total) by mouth every 7 (seven) days.  Dispense: 4 capsule; Refill: 0  4. Obesity, Current BMI 38.2 Monica Clark is currently in the action stage of change. As such, her goal is to continue with weight loss efforts. She has agreed to keeping a food journal and adhering to recommended goals of 1400 calories and 80+ grams of protein daily.   We will recheck fasting labs at her next visit.   Exercise goals: As is.   Behavioral modification strategies: increasing lean protein intake, meal planning and cooking strategies, and holiday eating strategies .  Monica Clark has agreed to follow-up with our clinic in 3 to 4 weeks. She was informed of the importance of frequent follow-up visits to maximize her success with intensive lifestyle modifications for her multiple health conditions.  Objective:   Blood pressure 130/83, pulse 66, temperature 98.5 F (36.9 C), height '5\' 3"'$  (1.6 m), weight 215 lb 6.4 oz (97.7 kg), SpO2 97 %. Body mass index is 38.16 kg/m.  General: Cooperative, alert, well developed, in no acute distress. HEENT: Conjunctivae and lids unremarkable. Cardiovascular: Regular rhythm.  Lungs: Normal work of breathing. Neurologic: No focal deficits.   Lab Results  Component Value Date   CREATININE 0.85 07/13/2021   BUN 11 07/13/2021   NA 143 07/13/2021   K  3.8 07/13/2021   CL 102 07/13/2021   CO2 26 07/13/2021   Lab Results  Component Value Date   ALT 11 07/13/2021   AST 13 07/13/2021   ALKPHOS 89 07/13/2021   BILITOT 0.3 07/13/2021   Lab Results  Component Value Date   HGBA1C 6.9 (H) 06/21/2021   HGBA1C 9.0 (H) 02/08/2021   HGBA1C 11.0 (H) 06/28/2020   HGBA1C 6.4 (A) 08/05/2019   HGBA1C 7.5 (H) 12/05/2018   Lab Results  Component Value Date   INSULIN 27.9 (H) 06/28/2020   INSULIN 8.7 04/16/2018   Lab Results  Component Value Date   TSH 2.220 04/16/2018   Lab Results  Component Value Date   CHOL 178 06/21/2021   HDL 31.30 (L) 06/21/2021   LDLCALC 121 (H) 02/08/2021   LDLDIRECT 110.0 06/21/2021   TRIG 230.0 (H) 06/21/2021   CHOLHDL 6 06/21/2021   Lab Results  Component Value Date   VD25OH 14.85 (L) 06/21/2021   VD25OH 24.7 (L) 02/08/2021   VD25OH 12.6 (L) 06/28/2020   Lab Results  Component Value Date   WBC 9.1 01/08/2020   HGB 13.5 01/08/2020   HCT 41.5 01/08/2020   MCV 92.2 01/08/2020   PLT 373 01/08/2020   No results found for: "IRON", "TIBC", "FERRITIN"  Attestation Statements:   Reviewed by clinician on day of visit: allergies, medications, problem list, medical history, surgical history, family history, social history, and previous encounter notes.   I, Trixie Dredge, am acting as transcriptionist for Dennard Nip, MD.  I have reviewed the above documentation for accuracy and completeness, and I agree with the above. -  Dennard Nip, MD

## 2022-01-13 ENCOUNTER — Encounter: Payer: Self-pay | Admitting: Family Medicine

## 2022-01-16 NOTE — Telephone Encounter (Signed)
I spoke with pt; pt said she has vaginal discharge that is gray in color but no odor.pt has vaginal and perineal itching. Pt has not had recent abx. Pt has used monistat but did not help symptoms. Pt was offered sooner appt but only wants to see female provider. Pt scheduled appt with Dr Glori Bickers on 01/18/22 at 2 PM pt will be at office at 1:45 to get checked in at front desk. UC & ED precautions given and pt voiced understanding. Sending note to Dr Glori Bickers and [employer].

## 2022-01-16 NOTE — Telephone Encounter (Signed)
[facility] Night - Client Nonclinical Telephone Record  [facility] Client [facility] Night - Client Client Site [place] - Night Contact Type Call Who Is Calling Patient / Member / Family / Caregiver Caller Name Bonney Berres Caller Phone Number [phone] Patient Name Monica Clark Patient DOB 1955/03/15 Call Type Message Only Information Provided Reason for Call Request for General Office Information Initial Comment caller states she would like to talk to office Disp. Time Disposition Final User 01/13/2022 8:43:43 AM General Information Provided Yes Demaris Callander Call Closed By: Demaris Callander Transaction Date/Time: 01/13/2022 8:41:21 AM (ET

## 2022-01-17 ENCOUNTER — Ambulatory Visit: Payer: BC Managed Care – PPO | Admitting: Family

## 2022-01-18 ENCOUNTER — Encounter: Payer: Self-pay | Admitting: Family Medicine

## 2022-01-18 ENCOUNTER — Ambulatory Visit: Payer: BC Managed Care – PPO | Admitting: Family Medicine

## 2022-01-18 VITALS — BP 134/78 | HR 62 | Temp 97.6°F | Ht 63.0 in | Wt 217.1 lb

## 2022-01-18 DIAGNOSIS — N760 Acute vaginitis: Secondary | ICD-10-CM | POA: Insufficient documentation

## 2022-01-18 LAB — POCT WET PREP (WET MOUNT): Trichomonas Wet Prep HPF POC: ABSENT

## 2022-01-18 MED ORDER — FLUCONAZOLE 150 MG PO TABS: ORAL_TABLET | ORAL | 1 refills | Status: DC

## 2022-01-18 NOTE — Patient Instructions (Signed)
Take the diflucan as directed for yeast infection   Take a break from tubs until this resolves   Update if not starting to improve in a week or if worsening    Try to keep blood sugar as well controlled as you can

## 2022-01-18 NOTE — Assessment & Plan Note (Signed)
Yeast on wet prep with c/o itching Diabetic and taking jardiance  Min imp on otc monistat  Px difluan 150 mg to take now and repeat in 3 d  Update if not starting to improve in a week or if worsening   Handout given  Keep area dry if able/opt for not tight clothing  Work on glucose control

## 2022-01-18 NOTE — Progress Notes (Signed)
Subjective:    Patient ID: Monica Clark, female    DOB: 11-17-1955, 66 y.o.   MRN: [id number]  HPI 66 yo pt of Dr Diona Browner presents with vaginal symptoms   Wt Readings from Last 3 Encounters:  01/18/22 217 lb 2 oz (98.5 kg)  12/29/21 215 lb 6.4 oz (97.7 kg)  12/08/21 219 lb 2 oz (99.4 kg)   38.46 kg/m  Symptoms started  Vaginal d/c - a little thicker than usual  Grey in color  No odor  Vaginal and perineal itching  Both inside and outside   No cramping or bladder pain  Otherwise feels ok   Used monistat -eased it just a little  No recent abx Has DM  and takes jardiance Lab Results  Component Value Date   HGBA1C 6.9 (H) 06/21/2021  Sugar is up recently   Off ozempic since it was out of stock   Of note had yeast infections long ago  Took diflucan    Results for orders placed or performed in visit on 01/18/22  POCT Wet Prep [employer] [facility])  Result Value Ref Range   Source Wet Prep POC vaginal    WBC, Wet Prep HPF POC mod    Bacteria Wet Prep HPF POC Moderate (A) Few   BACTERIA WET PREP MORPHOLOGY POC     Clue Cells Wet Prep HPF POC Few (A) None   Clue Cells Wet Prep [place] POC     Yeast Wet Prep HPF POC Moderate (A) None   KOH Wet Prep POC Moderate (A) None   Trichomonas Wet Prep HPF POC Absent Absent   Patient Active Problem List   Diagnosis Date Noted   Vaginitis 01/18/2022   Viral sinusitis 12/08/2021   Right buttock pain 11/04/2021   Acute pain of right shoulder 11/04/2021   Primary osteoarthritis of right hip 11/04/2021   Other hyperlipidemia 10/06/2021   Diabetes mellitus ([place]) 10/06/2021   Genetic testing 09/27/2021   Vitamin D deficiency 06/02/2021   Hyperlipidemia associated with type 2 diabetes mellitus ([place]) 06/02/2021   Hypertension associated with type 2 diabetes mellitus ([place]) 06/09/2020   Class 2 severe obesity with serious comorbidity and body mass index (BMI) of 38.0 to 38.9 in adult [facility]) 06/09/2020   Achilles tendonitis, bilateral  09/16/2019   Type 2 diabetes mellitus with hyperglycemia, without long-term current use of insulin ([place]) 03/28/2017   History of endometrial cancer 11/09/2014   Atypical ductal hyperplasia of breast 08/09/2014   Nipple discharge 03/29/2011   Past Medical History:  Diagnosis Date   Breast mass, left    duct mass   Depression    Endometrial hyperplasia    Fuchs' corneal dystrophy    Gallbladder problem    Heart murmur    states no known problems, has never seen a cardiologist   Hypertension    states under control with med., has been on med. x 5 yr.   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[place])    Nipple discharge 04/2014   left breast   Non-insulin dependent type 2 diabetes mellitus ([place])    Primary osteoarthritis of right hip 11/04/2021   Past Surgical History:  Procedure Laterality Date   BREAST BIOPSY Left 04/30/2014   Procedure: LEFT BREAST BIOPSY;  Surgeon: Jackolyn Confer, MD;  Location: [place];  Service: General;  Laterality: Left;   BREAST DUCTAL SYSTEM EXCISION Left 04/30/2014   Procedure: LEFT NIPPLE DUCT EXCISION;  Surgeon: Jackolyn Confer, MD;  Location: [place];  Service: General;  Laterality: Left;   [place]   ROBOTIC ASSISTED TOTAL HYSTERECTOMY WITH BILATERAL SALPINGO OOPHERECTOMY Bilateral 10/13/2014   Procedure: ROBOTIC ASSISTED TOTAL HYSTERECTOMY WITH BILATERAL SALPINGO OOPHORECTOMY ;  Surgeon: Everitt Amber, MD;  Location: [facility];  Service: Gynecology;  Laterality: Bilateral;   Social History   Tobacco Use   Smoking status: Never   Smokeless tobacco: Never  Substance Use Topics   Alcohol use: Yes    Comment: rarely   Drug use: No   Family History  Problem Relation Age of Onset   Diabetes Mother    Obesity Mother    Ovarian cancer Mother 4   Heart disease Father    Throat cancer Father 65 - 25   Sudden death Father    Alcoholism Father     Diabetes Sister    Diabetes Brother    Breast cancer Maternal Aunt    Prostate cancer Maternal Grandfather 43 - 89       metastatic   Colon cancer Maternal Grandfather    Breast cancer Cousin 36       maternal first cousin   Breast cancer Cousin        3 paternal first cousins   Allergies  Allergen Reactions   Codeine Other (See Comments)    INSOMNIA   Current Outpatient Medications on File Prior to Visit  Medication Sig Dispense Refill   diclofenac (VOLTAREN) 75 MG EC tablet Take 1 tablet (75 mg total) by mouth 2 (two) times daily. 30 tablet 0   empagliflozin (JARDIANCE) 25 MG TABS tablet Take 1 tablet (25 mg total) by mouth daily. 90 tablet 0   glucose blood (ONETOUCH VERIO) test strip 1 each by Other route as needed for other. Use to check blood sugar 2 times a day 100 each 0   insulin degludec (TRESIBA FLEXTOUCH) 100 UNIT/ML FlexTouch Pen As directed 20 units in am. 15 mL 3   Insulin Pen Needle 31G X 5 MM MISC Use once a day 100 each 1   metFORMIN (GLUCOPHAGE-XR) 500 MG 24 hr tablet Take 500 mg by mouth daily with breakfast.     rosuvastatin (CRESTOR) 5 MG tablet Take 1 tablet (5 mg total) by mouth daily. 30 tablet 0   Semaglutide, 1 MG/DOSE, 4 MG/3ML SOPN Inject 1 mg as directed once a week. 3 mL 0   triamterene-hydrochlorothiazide (MAXZIDE-25) 37.5-25 MG tablet Take 1 tablet by mouth every morning. Reported on 09/03/2015 30 tablet 0   Vitamin D, Ergocalciferol, (DRISDOL) 1.25 MG (50000 UNIT) CAPS capsule Take 1 capsule (50,000 Units total) by mouth every 7 (seven) days. 4 capsule 0   No current facility-administered medications on file prior to visit.    Review of Systems  Constitutional:  Negative for activity change, appetite change, fatigue, fever and unexpected weight change.  HENT:  Negative for congestion, ear pain, rhinorrhea, sinus pressure and sore throat.   Eyes:  Negative for pain, redness and visual disturbance.  Respiratory:  Negative for cough, shortness of  breath and wheezing.   Cardiovascular:  Negative for chest pain and palpitations.  Gastrointestinal:  Negative for abdominal pain, blood in stool, constipation and diarrhea.  Endocrine: Negative for polydipsia and polyuria.  Genitourinary:  Positive for vaginal discharge. Negative for dysuria, frequency and urgency.  Musculoskeletal:  Negative for arthralgias, back pain and myalgias.  Skin:  Negative for pallor and rash.  Allergic/Immunologic: Negative for environmental allergies.  Neurological:  Negative for dizziness, syncope and headaches.  Hematological:  Negative for adenopathy. Does not bruise/bleed easily.  Psychiatric/Behavioral:  Negative for decreased concentration and dysphoric mood. The patient is not nervous/anxious.        Objective:   Physical Exam Constitutional:      General: She is not in acute distress.    Appearance: Normal appearance. She is obese. She is not ill-appearing or diaphoretic.  Eyes:     General: No scleral icterus.    Conjunctiva/sclera: Conjunctivae normal.     Pupils: Pupils are equal, round, and reactive to light.  Cardiovascular:     Rate and Rhythm: Normal rate and regular rhythm.  Abdominal:     General: There is no distension.     Tenderness: There is no abdominal tenderness.     Comments: No suprapubic tenderness or fullness    Genitourinary:    Comments: Vaginal mucosa is mildly hyperemic  No vaginal discharge or odor  No lesions No tenderness  Lymphadenopathy:     Cervical: No cervical adenopathy.  Skin:    General: Skin is warm and dry.     Findings: No rash.  Neurological:     Mental Status: She is alert.  Psychiatric:        Mood and Affect: Mood normal.           Assessment & Plan:   Problem List Items Addressed This Visit       Genitourinary   Vaginitis - Primary    Yeast on wet prep with c/o itching Diabetic and taking jardiance  Min imp on otc monistat  Px difluan 150 mg to take now and repeat in 3 d   Update if not starting to improve in a week or if worsening   Handout given  Keep area dry if able/opt for not tight clothing  Work on glucose control      Relevant Orders   POCT Wet Prep [employer]) (Completed)

## 2022-01-22 ENCOUNTER — Other Ambulatory Visit (INDEPENDENT_AMBULATORY_CARE_PROVIDER_SITE_OTHER): Payer: Self-pay | Admitting: Physician Assistant

## 2022-01-22 DIAGNOSIS — E7849 Other hyperlipidemia: Secondary | ICD-10-CM

## 2022-01-24 ENCOUNTER — Ambulatory Visit (INDEPENDENT_AMBULATORY_CARE_PROVIDER_SITE_OTHER): Payer: BC Managed Care – PPO | Admitting: Physician Assistant

## 2022-01-24 ENCOUNTER — Encounter (INDEPENDENT_AMBULATORY_CARE_PROVIDER_SITE_OTHER): Payer: Self-pay | Admitting: Physician Assistant

## 2022-01-24 VITALS — BP 136/89 | HR 71 | Temp 97.8°F | Ht 63.0 in | Wt 211.0 lb

## 2022-01-24 DIAGNOSIS — E559 Vitamin D deficiency, unspecified: Secondary | ICD-10-CM | POA: Diagnosis not present

## 2022-01-24 DIAGNOSIS — E7849 Other hyperlipidemia: Secondary | ICD-10-CM | POA: Diagnosis not present

## 2022-01-24 DIAGNOSIS — Z794 Long term (current) use of insulin: Secondary | ICD-10-CM

## 2022-01-24 DIAGNOSIS — E669 Obesity, unspecified: Secondary | ICD-10-CM

## 2022-01-24 DIAGNOSIS — Z7985 Long-term (current) use of injectable non-insulin antidiabetic drugs: Secondary | ICD-10-CM

## 2022-01-24 DIAGNOSIS — R5383 Other fatigue: Secondary | ICD-10-CM | POA: Diagnosis not present

## 2022-01-24 DIAGNOSIS — Z6837 Body mass index (BMI) 37.0-37.9, adult: Secondary | ICD-10-CM

## 2022-01-24 DIAGNOSIS — E1169 Type 2 diabetes mellitus with other specified complication: Secondary | ICD-10-CM

## 2022-01-24 MED ORDER — EMPAGLIFLOZIN 25 MG PO TABS: 25.0000 mg | ORAL_TABLET | Freq: Every day | ORAL | 0 refills | Status: DC

## 2022-01-24 MED ORDER — VITAMIN D (ERGOCALCIFEROL) 1.25 MG (50000 UNIT) PO CAPS: 50000.0000 [IU] | ORAL_CAPSULE | ORAL | 0 refills | Status: DC

## 2022-01-24 MED ORDER — TRESIBA FLEXTOUCH 100 UNIT/ML ~~LOC~~ SOPN: PEN_INJECTOR | SUBCUTANEOUS | 3 refills | Status: DC

## 2022-01-24 MED ORDER — SEMAGLUTIDE (1 MG/DOSE) 4 MG/3ML ~~LOC~~ SOPN: 1.0000 mg | PEN_INJECTOR | SUBCUTANEOUS | 0 refills | Status: DC

## 2022-01-25 LAB — CBC WITH DIFFERENTIAL/PLATELET
Basophils Absolute: 0.1 10*3/uL (ref 0.0–0.2)
Basos: 1 %
EOS (ABSOLUTE): 0.2 10*3/uL (ref 0.0–0.4)
Eos: 2 %
Hematocrit: 40.9 % (ref 34.0–46.6)
Hemoglobin: 14 g/dL (ref 11.1–15.9)
Immature Grans (Abs): 0 10*3/uL (ref 0.0–0.1)
Immature Granulocytes: 0 %
Lymphocytes Absolute: 2.1 10*3/uL (ref 0.7–3.1)
Lymphs: 19 %
MCH: 30.3 pg (ref 26.6–33.0)
MCHC: 34.2 g/dL (ref 31.5–35.7)
MCV: 89 fL (ref 79–97)
Monocytes Absolute: 0.4 10*3/uL (ref 0.1–0.9)
Monocytes: 3 %
Neutrophils Absolute: 8.1 10*3/uL — ABNORMAL HIGH (ref 1.4–7.0)
Neutrophils: 75 %
Platelets: 367 10*3/uL (ref 150–450)
RBC: 4.62 x10E6/uL (ref 3.77–5.28)
RDW: 14.5 % (ref 11.7–15.4)
WBC: 10.9 10*3/uL — ABNORMAL HIGH (ref 3.4–10.8)

## 2022-01-25 LAB — CMP14+EGFR
ALT: 9 IU/L (ref 0–32)
AST: 11 IU/L (ref 0–40)
Albumin/Globulin Ratio: 1.2 (ref 1.2–2.2)
Albumin: 4.1 g/dL (ref 3.9–4.9)
Alkaline Phosphatase: 100 IU/L (ref 44–121)
BUN/Creatinine Ratio: 12 (ref 12–28)
BUN: 10 mg/dL (ref 8–27)
Bilirubin Total: 0.4 mg/dL (ref 0.0–1.2)
CO2: 21 mmol/L (ref 20–29)
Calcium: 9 mg/dL (ref 8.7–10.3)
Chloride: 106 mmol/L (ref 96–106)
Creatinine, Ser: 0.82 mg/dL (ref 0.57–1.00)
Globulin, Total: 3.3 g/dL (ref 1.5–4.5)
Glucose: 135 mg/dL — ABNORMAL HIGH (ref 70–99)
Potassium: 3.8 mmol/L (ref 3.5–5.2)
Sodium: 145 mmol/L — ABNORMAL HIGH (ref 134–144)
Total Protein: 7.4 g/dL (ref 6.0–8.5)
eGFR: 80 mL/min/{1.73_m2} (ref >59)

## 2022-01-25 LAB — LIPID PANEL WITH LDL/HDL RATIO
Cholesterol, Total: 221 mg/dL — ABNORMAL HIGH (ref 100–199)
HDL: 38 mg/dL — ABNORMAL LOW (ref >39)
LDL Chol Calc (NIH): 153 mg/dL — ABNORMAL HIGH (ref 0–99)
LDL/HDL Ratio: 4 ratio — ABNORMAL HIGH (ref 0.0–3.2)
Triglycerides: 162 mg/dL — ABNORMAL HIGH (ref 0–149)
VLDL Cholesterol Cal: 30 mg/dL (ref 5–40)

## 2022-01-25 LAB — TSH: TSH: 1.99 u[IU]/mL (ref 0.450–4.500)

## 2022-01-25 LAB — HEMOGLOBIN A1C
Est. average glucose Bld gHb Est-mCnc: 186 mg/dL
Hgb A1c MFr Bld: 8.1 % — ABNORMAL HIGH (ref 4.8–5.6)

## 2022-01-25 LAB — VITAMIN B12: Vitamin B-12: 334 pg/mL (ref 232–1245)

## 2022-01-25 LAB — VITAMIN D 25 HYDROXY (VIT D DEFICIENCY, FRACTURES): Vit D, 25-Hydroxy: 21.9 ng/mL — ABNORMAL LOW (ref 30.0–100.0)

## 2022-01-26 ENCOUNTER — Other Ambulatory Visit (INDEPENDENT_AMBULATORY_CARE_PROVIDER_SITE_OTHER): Payer: Self-pay | Admitting: Family Medicine

## 2022-01-26 DIAGNOSIS — Z794 Long term (current) use of insulin: Secondary | ICD-10-CM

## 2022-02-06 NOTE — Progress Notes (Signed)
Chief Complaint:   OBESITY Monica Clark is here to discuss her progress with her obesity treatment plan along with follow-up of her obesity related diagnoses. Monica Clark is on keeping a food journal and adhering to recommended goals of 1400 calories and 80 grams of protein and states she is following her eating plan approximately 70% of the time. Monica Clark states she is walking 20 minutes 2 times per week.  Today's visit was #: 39 Starting weight: 216 lbs Starting date: 04/16/2018 Today's weight: 211 lbs Today's date: 01/24/2022 Total lbs lost to date: 5 lbs Total lbs lost since last in-office visit: 4  Interim History: Monica Clark has done well with weight loss over Thanksgiving.  Taking GLP-1(Ozempic 1 mg weekly) Reports no side effects.  Hunger controlled overall.  Subjective:   1. Type 2 diabetes mellitus with other specified complication, with long-term current use of insulin (HCC) Monica Clark is taking Jardiance 25 mg daily, Tresiba 20 units daily and Ozempic 1 mg weekly and metformin 500 mg daily.  Fasting CBG, 130's-no lows except 1 of 60 , was asymptomatic, but otherwise good. No side effects with medications. Has CGM.  Follows with Endocrinology, Dr. Lucianne Muss.   2. Other hyperlipidemia Monica Clark is taking Crestor 5 mg daily with no side effects.  3. Vitamin D deficiency Monica Clark is currently taking prescription Vit D 50,000 IU once a week. Denies any side effects.  4. Fatigue, unspecified type Monica Clark reports feels chronically tired.   Assessment/Plan:   1. Type 2 diabetes mellitus with other specified complication, with long-term current use of insulin (HCC) We will obtain labs today. Continue/refill Medications for 3 months with 0 refills per patient request today. Follow up with Endocrinology as directed as well.   - CMP14+EGFR - Hemoglobin A1c  -Refill Semaglutide, 1 MG/DOSE, 4 MG/3ML SOPN; Inject 1 mg as directed once a week.  Dispense: 3 mL; Refill: 0 - Refill insulin degludec (TRESIBA  FLEXTOUCH) 100 UNIT/ML FlexTouch Pen; As directed 20 units in am.  Dispense: 15 mL; Refill: 3  -Refill empagliflozin (JARDIANCE) 25 MG TABS tablet; Take 1 tablet (25 mg total) by mouth daily.  Dispense: 90 tablet; Refill: 0  2. Other hyperlipidemia We will obtain labs today. Continue Crestor and eating plan with exercise.  - Lipid Panel With LDL/HDL Ratio  3. Vitamin D deficiency We will obtain labs today. We will refill Vit D 50K IU once a week for 1 month with 0 refills.  - VITAMIN D 25 Hydroxy (Vit-D Deficiency, Fractures)  -Refill Vitamin D, Ergocalciferol, (DRISDOL) 1.25 MG (50000 UNIT) CAPS capsule; Take 1 capsule (50,000 Units total) by mouth every 7 (seven) days.  Dispense: 4 capsule; Refill: 0  4. Fatigue, unspecified type We will obtain labs today. Continue healthy eating plan and exercise.  - Vitamin B12 - CBC with Differential/Platelet - TSH  5. Obesity, Current BMI 37.4 Monica Clark is currently in the action stage of change. As such, her goal is to continue with weight loss efforts. She has agreed to the Category 2 Plan and keeping a food journal and adhering to recommended goals of 1400 calories and 80+ grams of protein daily.   Exercise goals: As is.  Behavioral modification strategies: increasing lean protein intake, decreasing simple carbohydrates, meal planning and cooking strategies, and holiday eating strategies .  Monica Clark has agreed to follow-up with our clinic in 4 weeks. She was informed of the importance of frequent follow-up visits to maximize her success with intensive lifestyle modifications for her multiple health conditions.  Monica Clark was informed we would discuss her lab results at her next visit unless there is a critical issue that needs to be addressed sooner. Monica Clark agreed to keep her next visit at the agreed upon time to discuss these results.  Objective:   Blood pressure 136/89, pulse 71, temperature 97.8 F (36.6 C), height 5\' 3"  (1.6 m), weight 211 lb  (95.7 kg), SpO2 97 %. Body mass index is 37.38 kg/m.  General: Cooperative, alert, well developed, in no acute distress. HEENT: Conjunctivae and lids unremarkable. Cardiovascular: Regular rhythm.  Lungs: Normal work of breathing. Neurologic: No focal deficits.   Lab Results  Component Value Date   CREATININE 0.82 01/24/2022   BUN 10 01/24/2022   NA 145 (H) 01/24/2022   K 3.8 01/24/2022   CL 106 01/24/2022   CO2 21 01/24/2022   Lab Results  Component Value Date   ALT 9 01/24/2022   AST 11 01/24/2022   ALKPHOS 100 01/24/2022   BILITOT 0.4 01/24/2022   Lab Results  Component Value Date   HGBA1C 8.1 (H) 01/24/2022   HGBA1C 6.9 (H) 06/21/2021   HGBA1C 9.0 (H) 02/08/2021   HGBA1C 11.0 (H) 06/28/2020   HGBA1C 6.4 (A) 08/05/2019   Lab Results  Component Value Date   INSULIN 27.9 (H) 06/28/2020   INSULIN 8.7 04/16/2018   Lab Results  Component Value Date   TSH 1.990 01/24/2022   Lab Results  Component Value Date   CHOL 221 (H) 01/24/2022   HDL 38 (L) 01/24/2022   LDLCALC 153 (H) 01/24/2022   LDLDIRECT 110.0 06/21/2021   TRIG 162 (H) 01/24/2022   CHOLHDL 6 06/21/2021   Lab Results  Component Value Date   VD25OH 21.9 (L) 01/24/2022   VD25OH 14.85 (L) 06/21/2021   VD25OH 24.7 (L) 02/08/2021   Lab Results  Component Value Date   WBC 10.9 (H) 01/24/2022   HGB 14.0 01/24/2022   HCT 40.9 01/24/2022   MCV 89 01/24/2022   PLT 367 01/24/2022   No results found for: "IRON", "TIBC", "FERRITIN"  Attestation Statements:   Reviewed by clinician on day of visit: allergies, medications, problem list, medical history, surgical history, family history, social history, and previous encounter notes.  I, Brendell Tyus, am acting as transcriptionist for [employer], [employer].  I have reviewed the above documentation for accuracy and completeness, and I agree with the above. -  Burnie Therien,PA-C

## 2022-02-16 ENCOUNTER — Encounter (INDEPENDENT_AMBULATORY_CARE_PROVIDER_SITE_OTHER): Payer: Self-pay | Admitting: Physician Assistant

## 2022-02-16 ENCOUNTER — Ambulatory Visit (INDEPENDENT_AMBULATORY_CARE_PROVIDER_SITE_OTHER): Payer: BC Managed Care – PPO | Admitting: Physician Assistant

## 2022-02-16 VITALS — BP 136/80 | HR 58 | Temp 98.1°F | Ht 63.0 in | Wt 210.0 lb

## 2022-02-16 DIAGNOSIS — E1169 Type 2 diabetes mellitus with other specified complication: Secondary | ICD-10-CM | POA: Diagnosis not present

## 2022-02-16 DIAGNOSIS — E559 Vitamin D deficiency, unspecified: Secondary | ICD-10-CM | POA: Diagnosis not present

## 2022-02-16 DIAGNOSIS — E669 Obesity, unspecified: Secondary | ICD-10-CM

## 2022-02-16 DIAGNOSIS — D72829 Elevated white blood cell count, unspecified: Secondary | ICD-10-CM

## 2022-02-16 DIAGNOSIS — Z794 Long term (current) use of insulin: Secondary | ICD-10-CM

## 2022-02-16 DIAGNOSIS — E7849 Other hyperlipidemia: Secondary | ICD-10-CM

## 2022-02-16 DIAGNOSIS — Z7985 Long-term (current) use of injectable non-insulin antidiabetic drugs: Secondary | ICD-10-CM

## 2022-02-16 DIAGNOSIS — Z6837 Body mass index (BMI) 37.0-37.9, adult: Secondary | ICD-10-CM

## 2022-02-16 MED ORDER — VITAMIN D (ERGOCALCIFEROL) 1.25 MG (50000 UNIT) PO CAPS: 50000.0000 [IU] | ORAL_CAPSULE | ORAL | 0 refills | Status: DC

## 2022-02-16 MED ORDER — ROSUVASTATIN CALCIUM 5 MG PO TABS: 5.0000 mg | ORAL_TABLET | Freq: Every day | ORAL | 0 refills | Status: DC

## 2022-02-16 MED ORDER — EMPAGLIFLOZIN 25 MG PO TABS: 25.0000 mg | ORAL_TABLET | Freq: Every day | ORAL | 0 refills | Status: DC

## 2022-02-16 MED ORDER — SEMAGLUTIDE (1 MG/DOSE) 4 MG/3ML ~~LOC~~ SOPN: 1.0000 mg | PEN_INJECTOR | SUBCUTANEOUS | 0 refills | Status: DC

## 2022-02-23 ENCOUNTER — Ambulatory Visit: Payer: BC Managed Care – PPO | Admitting: Family Medicine

## 2022-02-28 ENCOUNTER — Ambulatory Visit: Payer: BC Managed Care – PPO | Admitting: Family Medicine

## 2022-03-01 NOTE — Progress Notes (Signed)
Chief Complaint:   OBESITY Monica Clark is here to discuss her progress with her obesity treatment plan along with follow-up of her obesity related diagnoses. Monica Clark is on the Category 2 Plan and states she is following her eating plan approximately 75% of the time. Monica Clark states she is exercising 0 minutes 0 times per week.  Today's visit was #: 10 Starting weight: 216 lbs Starting date: 04/16/2018 Today's weight: 210 lbs Today's date: 02/16/2022 Total lbs lost to date: 6 lbs Total lbs lost since last in-office visit: 1  Interim History: Monica Clark did well with weight loss over the holidays.  Recently had a bilateral cataract surgery and has been recovering well following her cataract surgery.   She has been unable to obtain usual Ozempic 1 mg weekly for the past 2 months due to the national drug shortage.  Reports some increased appetite and hunger when not taking Ozempic.  Subjective:   1. Type 2 diabetes mellitus with other specified complication, with long-term current use of insulin ([place]) Labs discussed during visit today.  A1c increased at 8.0-worsening but she had been taking Ozempic 1 mg weekly previously,but unable to obtain for the past couple of months.  No side effects when taking Ozempic 1 mg weekly.  Will hopefully be able to resume her usual Ozempic 1 mg weekly in the near future. Metformin 500 mg daily and Tresiba 20 units per Dr. Dwyane Dee.  Jardiance 25 mg daily-advised to follow-up with Dr. Dwyane Dee as well.  2. Other hyperlipidemia Labs discussed during visit today.  HDL of 38-increased, but not at goal/triglyceride of 162-decreased from previous, but not at goal/LDL of 153-stable but not at goal.  On Crestor 5 mg daily but not consistently taking as prescribed.  Encouraged compliance with daily use.  3. Vitamin D deficiency Labs discussed during visit today.  Vitamin D level of 21.9 on 01/24/2022.  Taking ergocalciferol once weekly--Level still low.  She endorses fatigue.  4.  Leukocytosis, unspecified type Very slightly increased WBC of 10.9 with slight left shift.  Patient reports a recent GU/yeast infection and feels this may not have fully cleared as yet as she is still having some GU symptoms/symptoms of yeast infection.  Assessment/Plan:   1. Type 2 diabetes mellitus with other specified complication, with long-term current use of insulin (HCC) Continue/Refill Ozempic 1 mg SQ once weekly AND Jardiance 25 mg daily for 1 month with 0 refills.  -Refill Semaglutide, 1 MG/DOSE, 4 MG/3ML SOPN; Inject 1 mg as directed once a week.  Dispense: 3 mL; Refill: 0  -Refill empagliflozin (JARDIANCE) 25 MG TABS tablet; Take 1 tablet (25 mg total) by mouth daily.  Dispense: 90 tablet; Refill: 0  Continue prescribed nutrition plan to decrease simple carbohydrates, increase lean protein, and exercise to promote weight loss.  2. Other hyperlipidemia Continue/Refill Crestor 5 mg daily for 1 month with 0 refills. Continue healthy eating plan to decrease saturated fat and cholesterol and exercise to promote weight loss.   -Refill rosuvastatin (CRESTOR) 5 MG tablet; Take 1 tablet (5 mg total) by mouth daily.  Dispense: 30 tablet; Refill: 0  3. Vitamin D deficiency Continue/Refill/increase Vit D 50,000 IU to twice a week for 1 month with 0 refills.  -Refill Vitamin D, Ergocalciferol, (DRISDOL) 1.25 MG (50000 UNIT) CAPS capsule; Take 1 capsule (50,000 Units total) by mouth 2 (two) times a week.  Dispense: 8 capsule; Refill: 0  4. Leukocytosis, unspecified type Discussed follow-up with PCP-feels like yeast infection has still not fully  resolved.  5. Obesity, Current BMI 37.3 Monica Clark is currently in the action stage of change. As such, her goal is to continue with weight loss efforts. She has agreed to the Category 2 Plan.   Exercise goals: As is.  Behavioral modification strategies: increasing lean protein intake, decreasing simple carbohydrates, and planning for  success.  Monica Clark has agreed to follow-up with our clinic in 4 weeks. She was informed of the importance of frequent follow-up visits to maximize her success with intensive lifestyle modifications for her multiple health conditions.   Objective:   Blood pressure 136/80, pulse (!) 58, temperature 98.1 F (36.7 C), height '5\' 3"'$  (1.6 m), weight 210 lb (95.3 kg), SpO2 99 %. Body mass index is 37.2 kg/m.  General: Cooperative, alert, well developed, in no acute distress. HEENT: Conjunctivae and lids unremarkable. Cardiovascular: Regular rhythm.  Lungs: Normal work of breathing. Neurologic: No focal deficits.   Lab Results  Component Value Date   CREATININE 0.82 01/24/2022   BUN 10 01/24/2022   NA 145 (H) 01/24/2022   K 3.8 01/24/2022   CL 106 01/24/2022   CO2 21 01/24/2022   Lab Results  Component Value Date   ALT 9 01/24/2022   AST 11 01/24/2022   ALKPHOS 100 01/24/2022   BILITOT 0.4 01/24/2022   Lab Results  Component Value Date   HGBA1C 8.1 (H) 01/24/2022   HGBA1C 6.9 (H) 06/21/2021   HGBA1C 9.0 (H) 02/08/2021   HGBA1C 11.0 (H) 06/28/2020   HGBA1C 6.4 (A) 08/05/2019   Lab Results  Component Value Date   INSULIN 27.9 (H) 06/28/2020   INSULIN 8.7 04/16/2018   Lab Results  Component Value Date   TSH 1.990 01/24/2022   Lab Results  Component Value Date   CHOL 221 (H) 01/24/2022   HDL 38 (L) 01/24/2022   LDLCALC 153 (H) 01/24/2022   LDLDIRECT 110.0 06/21/2021   TRIG 162 (H) 01/24/2022   CHOLHDL 6 06/21/2021   Lab Results  Component Value Date   VD25OH 21.9 (L) 01/24/2022   VD25OH 14.85 (L) 06/21/2021   VD25OH 24.7 (L) 02/08/2021   Lab Results  Component Value Date   WBC 10.9 (H) 01/24/2022   HGB 14.0 01/24/2022   HCT 40.9 01/24/2022   MCV 89 01/24/2022   PLT 367 01/24/2022   No results found for: "IRON", "TIBC", "FERRITIN"  Attestation Statements:   Reviewed by clinician on day of visit: allergies, medications, problem list, medical history,  surgical history, family history, social history, and previous encounter notes.  I, Brendell Tyus, am acting as transcriptionist for [employer], [employer].  I have reviewed the above documentation for accuracy and completeness, and I agree with the above. -  Sandar Krinke,PA-C

## 2022-03-14 ENCOUNTER — Ambulatory Visit (INDEPENDENT_AMBULATORY_CARE_PROVIDER_SITE_OTHER): Payer: BC Managed Care – PPO | Admitting: Family Medicine

## 2022-03-14 ENCOUNTER — Encounter (INDEPENDENT_AMBULATORY_CARE_PROVIDER_SITE_OTHER): Payer: Self-pay | Admitting: Family Medicine

## 2022-03-14 VITALS — BP 132/74 | HR 73 | Ht 63.0 in | Wt 205.0 lb

## 2022-03-14 DIAGNOSIS — E559 Vitamin D deficiency, unspecified: Secondary | ICD-10-CM

## 2022-03-14 DIAGNOSIS — G4709 Other insomnia: Secondary | ICD-10-CM | POA: Diagnosis not present

## 2022-03-14 DIAGNOSIS — Z794 Long term (current) use of insulin: Secondary | ICD-10-CM

## 2022-03-14 DIAGNOSIS — Z7985 Long-term (current) use of injectable non-insulin antidiabetic drugs: Secondary | ICD-10-CM

## 2022-03-14 DIAGNOSIS — E1169 Type 2 diabetes mellitus with other specified complication: Secondary | ICD-10-CM

## 2022-03-14 DIAGNOSIS — E7849 Other hyperlipidemia: Secondary | ICD-10-CM

## 2022-03-14 DIAGNOSIS — Z6836 Body mass index (BMI) 36.0-36.9, adult: Secondary | ICD-10-CM

## 2022-03-14 DIAGNOSIS — E669 Obesity, unspecified: Secondary | ICD-10-CM

## 2022-03-14 MED ORDER — SEMAGLUTIDE (1 MG/DOSE) 4 MG/3ML ~~LOC~~ SOPN: 1.0000 mg | PEN_INJECTOR | SUBCUTANEOUS | 0 refills | Status: DC

## 2022-03-21 ENCOUNTER — Ambulatory Visit: Payer: BC Managed Care – PPO | Admitting: Family Medicine

## 2022-03-21 ENCOUNTER — Ambulatory Visit (INDEPENDENT_AMBULATORY_CARE_PROVIDER_SITE_OTHER): Payer: BC Managed Care – PPO | Admitting: Family Medicine

## 2022-03-22 ENCOUNTER — Other Ambulatory Visit (INDEPENDENT_AMBULATORY_CARE_PROVIDER_SITE_OTHER): Payer: Self-pay | Admitting: Physician Assistant

## 2022-03-22 DIAGNOSIS — E559 Vitamin D deficiency, unspecified: Secondary | ICD-10-CM

## 2022-03-29 NOTE — Progress Notes (Unsigned)
Chief Complaint:   OBESITY Monica Clark is here to discuss her progress with her obesity treatment plan along with follow-up of her obesity related diagnoses. Monica Clark is on the Category 2 Plan and states she is following her eating plan approximately 70% of the time. Monica Clark states she is doing 0 minutes 0 times per week.  Today's visit was #: 45 Starting weight: 216 lbs Starting date: 04/16/2018 Today's weight: 205 lbs Today's date: 03/14/2022 Total lbs lost to date: 11 Total lbs lost since last in-office visit: 5  Interim History: Monica Clark has done well with weight loss since her last visit.  She is working on increasing her water intake and doing better with portion control.  Subjective:   1. Type 2 diabetes mellitus with other specified complication, with long-term current use of insulin (HCC) Monica Clark is working on her diet and weight loss.  She is doing well on her current medications with no side effects noted.  I discussed labs with the patient today.  2. Other hyperlipidemia Monica Clark is on Lipitor, and she is working on her diet, but her LDL is worsening and has increased.  This is likely related to holiday eating.  I discussed labs with the patient today.  3. Vitamin D deficiency Monica Clark is on vitamin D, and her vitamin D level is improving slowly.  I discussed labs with the patient today.  4. Other insomnia Monica Clark notes insomnia with daytime somnolence and only sleeping 4 to 5 hours, then wakes up and cannot fall back to sleep.  Assessment/Plan:   1. Type 2 diabetes mellitus with other specified complication, with long-term current use of insulin (HCC) We will refill Ozempic for 1 month.  Monica Clark will continue metformin and Jardiance.  - Semaglutide, 1 MG/DOSE, 4 MG/3ML SOPN; Inject 1 mg as directed once a week.  Dispense: 3 mL; Refill: 0  2. Other hyperlipidemia Monica Clark will continue Lipitor and will continue to get back on track.  3. Vitamin D deficiency Monica Clark will continue vitamin D,  and we will recheck labs in 3 months.  4. Other insomnia Monica Clark was referred to Dr. Brett Fairy for sleep evaluation.  - Ambulatory referral to Neurology  5. BMI 36.0-36.9,adult  6. Obesity,Beginning BMI 38.26 Monica Clark is currently in the action stage of change. As such, her goal is to continue with weight loss efforts. She has agreed to the Category 2 Plan.   Exercise goals: Chair yoga  for 15 minutes 3 times per week.   Behavioral modification strategies: increasing lean protein intake and better snacking choices.  Monica Clark has agreed to follow-up with our clinic in 4 weeks. She was informed of the importance of frequent follow-up visits to maximize her success with intensive lifestyle modifications for her multiple health conditions.   Objective:   Blood pressure 132/74, pulse 73, height '5\' 3"'$  (1.6 m), weight 205 lb (93 kg), SpO2 97 %. Body mass index is 36.31 kg/m.  General: Cooperative, alert, well developed, in no acute distress. HEENT: Conjunctivae and lids unremarkable. Cardiovascular: Regular rhythm.  Lungs: Normal work of breathing. Neurologic: No focal deficits.   Lab Results  Component Value Date   CREATININE 0.82 01/24/2022   BUN 10 01/24/2022   NA 145 (H) 01/24/2022   K 3.8 01/24/2022   CL 106 01/24/2022   CO2 21 01/24/2022   Lab Results  Component Value Date   ALT 9 01/24/2022   AST 11 01/24/2022   ALKPHOS 100 01/24/2022   BILITOT 0.4 01/24/2022   Lab  Results  Component Value Date   HGBA1C 8.1 (H) 01/24/2022   HGBA1C 6.9 (H) 06/21/2021   HGBA1C 9.0 (H) 02/08/2021   HGBA1C 11.0 (H) 06/28/2020   HGBA1C 6.4 (A) 08/05/2019   Lab Results  Component Value Date   INSULIN 27.9 (H) 06/28/2020   INSULIN 8.7 04/16/2018   Lab Results  Component Value Date   TSH 1.990 01/24/2022   Lab Results  Component Value Date   CHOL 221 (H) 01/24/2022   HDL 38 (L) 01/24/2022   LDLCALC 153 (H) 01/24/2022   LDLDIRECT 110.0 06/21/2021   TRIG 162 (H) 01/24/2022   CHOLHDL  6 06/21/2021   Lab Results  Component Value Date   VD25OH 21.9 (L) 01/24/2022   VD25OH 14.85 (L) 06/21/2021   VD25OH 24.7 (L) 02/08/2021   Lab Results  Component Value Date   WBC 10.9 (H) 01/24/2022   HGB 14.0 01/24/2022   HCT 40.9 01/24/2022   MCV 89 01/24/2022   PLT 367 01/24/2022   No results found for: "IRON", "TIBC", "FERRITIN"  Attestation Statements:   Reviewed by clinician on day of visit: allergies, medications, problem list, medical history, surgical history, family history, social history, and previous encounter notes.   I, Trixie Dredge, am acting as transcriptionist for Dennard Nip, MD.  I have reviewed the above documentation for accuracy and completeness, and I agree with the above. -  Dennard Nip, MD

## 2022-04-11 ENCOUNTER — Ambulatory Visit (INDEPENDENT_AMBULATORY_CARE_PROVIDER_SITE_OTHER): Payer: BC Managed Care – PPO | Admitting: Family Medicine

## 2022-04-14 ENCOUNTER — Ambulatory Visit: Payer: BC Managed Care – PPO | Admitting: Family Medicine

## 2022-04-14 ENCOUNTER — Encounter: Payer: Self-pay | Admitting: Family Medicine

## 2022-04-14 VITALS — BP 126/74 | HR 74 | Temp 98.7°F | Ht 63.5 in | Wt 215.1 lb

## 2022-04-14 DIAGNOSIS — D72829 Elevated white blood cell count, unspecified: Secondary | ICD-10-CM | POA: Diagnosis not present

## 2022-04-14 DIAGNOSIS — M545 Low back pain, unspecified: Secondary | ICD-10-CM | POA: Diagnosis not present

## 2022-04-14 DIAGNOSIS — M25819 Other specified joint disorders, unspecified shoulder: Secondary | ICD-10-CM | POA: Insufficient documentation

## 2022-04-14 DIAGNOSIS — G8929 Other chronic pain: Secondary | ICD-10-CM | POA: Insufficient documentation

## 2022-04-14 LAB — CBC WITH DIFFERENTIAL/PLATELET
Absolute Monocytes: 464 cells/uL (ref 200–950)
Basophils Absolute: 46 cells/uL (ref 0–200)
Basophils Relative: 0.4 %
Eosinophils Absolute: 220 cells/uL (ref 15–500)
Eosinophils Relative: 1.9 %
HCT: 40.2 % (ref 35.0–45.0)
Hemoglobin: 13.5 g/dL (ref 11.7–15.5)
Lymphs Abs: 3016 cells/uL (ref 850–3900)
MCH: 29.4 pg (ref 27.0–33.0)
MCHC: 33.6 g/dL (ref 32.0–36.0)
MCV: 87.6 fL (ref 80.0–100.0)
MPV: 10.2 fL (ref 7.5–12.5)
Monocytes Relative: 4 %
Neutro Abs: 7853 cells/uL — ABNORMAL HIGH (ref 1500–7800)
Neutrophils Relative %: 67.7 %
Platelets: 398 10*3/uL (ref 140–400)
RBC: 4.59 10*6/uL (ref 3.80–5.10)
RDW: 14.3 % (ref 11.0–15.0)
Total Lymphocyte: 26 %
WBC: 11.6 10*3/uL — ABNORMAL HIGH (ref 3.8–10.8)

## 2022-04-14 MED ORDER — DICLOFENAC SODIUM 75 MG PO TBEC: 75.0000 mg | DELAYED_RELEASE_TABLET | Freq: Two times a day (BID) | ORAL | 0 refills | Status: DC | PRN

## 2022-04-14 NOTE — Assessment & Plan Note (Signed)
Very mild, associated with vaginal yeast infection at the time.  Likely reactive but will recheck for normalization today.

## 2022-04-14 NOTE — Assessment & Plan Note (Signed)
Acute, likely secondary to lumbar degenerative changes.  Recommend home physical therapy and diclofenac as needed in addition to heat and massage.  If symptoms not improving as expected we can consider referral to formal physical therapy or orthopedics.

## 2022-04-14 NOTE — Progress Notes (Signed)
Patient ID: Monica Clark, female    DOB: 31-Aug-1955, 67 y.o.   MRN: [id number]  This visit was conducted in person.  BP 126/74   Pulse 74   Temp 98.7 F (37.1 C) (Temporal)   Ht 5' 3.5" (1.613 m)   Wt 215 lb 2 oz (97.6 kg)   SpO2 99%   BMI 37.51 kg/m    CC:  Chief Complaint  Patient presents with   Knot on Left Shoulder   Back Pain   Abnormal Lab    Elevated WBC in 01/2022 at Dr. Leafy Ro' office and was told to follow up with PCP    Subjective:   HPI: Monica Clark is a 67 y.o. female presenting on 04/14/2022 for Knot on Left Shoulder, Back Pain, and Abnormal Lab (Elevated WBC in 01/2022 at Dr. Leafy Ro' office and was told to follow up with PCP)  Patient's main reason for follow-up in office today is an elevated white count noted in December 2023 at Dr. Migdalia Dk office. Per review of records and note from January 24, 2022 WBC count was 10.9 with normal differential.  Occurred after yeast infection... that has resolved.  She also notes new knot on her left shoulder.. has noted more prominent bon of shoulder on left shoulder.  She has been experiencing low back pain.    Right hip pain and versus right lumbar back pain with sciatica causing right buttock pain was treated in September 2023 with diclofenac 75 mg p.o. twice daily. X-ray showed multilevel degenerative disc disease and facet arthropathy.   More pain on left lower  back.. no radiation of pain or weakness and numbness in legs.   Relevant past medical, surgical, family and social history reviewed and updated as indicated. Interim medical history since our last visit reviewed. Allergies and medications reviewed and updated. Outpatient Medications Prior to Visit  Medication Sig Dispense Refill   empagliflozin (JARDIANCE) 25 MG TABS tablet Take 1 tablet (25 mg total) by mouth daily. 90 tablet 0   fluconazole (DIFLUCAN) 150 MG tablet Take one pill by mouth now and one in 3 days 1 tablet 1   glucose blood  (ONETOUCH VERIO) test strip 1 each by Other route as needed for other. Use to check blood sugar 2 times a day 100 each 0   insulin degludec (TRESIBA FLEXTOUCH) 100 UNIT/ML FlexTouch Pen As directed 20 units in am. 15 mL 3   Insulin Pen Needle 31G X 5 MM MISC Use once a day 100 each 1   metFORMIN (GLUCOPHAGE-XR) 500 MG 24 hr tablet Take 500 mg by mouth daily with breakfast.     OZEMPIC, 2 MG/DOSE, 8 MG/3ML SOPN Inject 2 mg into the skin once a week.     rosuvastatin (CRESTOR) 5 MG tablet Take 1 tablet (5 mg total) by mouth daily. 30 tablet 0   triamterene-hydrochlorothiazide (MAXZIDE-25) 37.5-25 MG tablet Take 1 tablet by mouth every morning. Reported on 09/03/2015 30 tablet 0   Vitamin D, Ergocalciferol, (DRISDOL) 1.25 MG (50000 UNIT) CAPS capsule Take 1 capsule (50,000 Units total) by mouth 2 (two) times a week. 8 capsule 0   diclofenac (VOLTAREN) 75 MG EC tablet Take 1 tablet (75 mg total) by mouth 2 (two) times daily. 30 tablet 0   Semaglutide, 1 MG/DOSE, 4 MG/3ML SOPN Inject 1 mg as directed once a week. 3 mL 0   No facility-administered medications prior to visit.     Per HPI unless specifically indicated in ROS section  below Review of Systems Objective:  BP 126/74   Pulse 74   Temp 98.7 F (37.1 C) (Temporal)   Ht 5' 3.5" (1.613 m)   Wt 215 lb 2 oz (97.6 kg)   SpO2 99%   BMI 37.51 kg/m   Wt Readings from Last 3 Encounters:  04/14/22 215 lb 2 oz (97.6 kg)  03/14/22 205 lb (93 kg)  02/16/22 210 lb (95.3 kg)      Physical Exam    Results for orders placed or performed in visit on 01/24/22  Vitamin B12  Result Value Ref Range   Vitamin B-12 334 232 - 1,245 pg/mL  CBC with Differential/Platelet  Result Value Ref Range   WBC 10.9 (H) 3.4 - 10.8 x10E3/uL   RBC 4.62 3.77 - 5.28 x10E6/uL   Hemoglobin 14.0 11.1 - 15.9 g/dL   Hematocrit 40.9 34.0 - 46.6 %   MCV 89 79 - 97 fL   MCH 30.3 26.6 - 33.0 pg   MCHC 34.2 31.5 - 35.7 g/dL   RDW 14.5 11.7 - 15.4 %   Platelets 367 150 -  450 x10E3/uL   Neutrophils 75 Not Estab. %   Lymphs 19 Not Estab. %   Monocytes 3 Not Estab. %   Eos 2 Not Estab. %   Basos 1 Not Estab. %   Neutrophils Absolute 8.1 (H) 1.4 - 7.0 x10E3/uL   Lymphocytes Absolute 2.1 0.7 - 3.1 x10E3/uL   Monocytes Absolute 0.4 0.1 - 0.9 x10E3/uL   EOS (ABSOLUTE) 0.2 0.0 - 0.4 x10E3/uL   Basophils Absolute 0.1 0.0 - 0.2 x10E3/uL   Immature Granulocytes 0 Not Estab. %   Immature Grans (Abs) 0.0 0.0 - 0.1 x10E3/uL  CMP14+EGFR  Result Value Ref Range   Glucose 135 (H) 70 - 99 mg/dL   BUN 10 8 - 27 mg/dL   Creatinine, Ser 0.82 0.57 - 1.00 mg/dL   eGFR 80 >59 mL/min/1.73   BUN/Creatinine Ratio 12 12 - 28   Sodium 145 (H) 134 - 144 mmol/L   Potassium 3.8 3.5 - 5.2 mmol/L   Chloride 106 96 - 106 mmol/L   CO2 21 20 - 29 mmol/L   Calcium 9.0 8.7 - 10.3 mg/dL   Total Protein 7.4 6.0 - 8.5 g/dL   Albumin 4.1 3.9 - 4.9 g/dL   Globulin, Total 3.3 1.5 - 4.5 g/dL   Albumin/Globulin Ratio 1.2 1.2 - 2.2   Bilirubin Total 0.4 0.0 - 1.2 mg/dL   Alkaline Phosphatase 100 44 - 121 IU/L   AST 11 0 - 40 IU/L   ALT 9 0 - 32 IU/L  Hemoglobin A1c  Result Value Ref Range   Hgb A1c MFr Bld 8.1 (H) 4.8 - 5.6 %   Est. average glucose Bld gHb Est-mCnc 186 mg/dL  Lipid Panel With LDL/HDL Ratio  Result Value Ref Range   Cholesterol, Total 221 (H) 100 - 199 mg/dL   Triglycerides 162 (H) 0 - 149 mg/dL   HDL 38 (L) >39 mg/dL   VLDL Cholesterol Cal 30 5 - 40 mg/dL   LDL Chol Calc (NIH) 153 (H) 0 - 99 mg/dL   LDL/HDL Ratio 4.0 (H) 0.0 - 3.2 ratio  VITAMIN D 25 Hydroxy (Vit-D Deficiency, Fractures)  Result Value Ref Range   Vit D, 25-Hydroxy 21.9 (L) 30.0 - 100.0 ng/mL  TSH  Result Value Ref Range   TSH 1.990 0.450 - 4.500 uIU/mL    Assessment and Plan  Leukocytosis, unspecified type Assessment & Plan: Very  mild, associated with vaginal yeast infection at the time.  Likely reactive but will recheck for normalization today.  Orders: -     CBC with  Differential/Platelet  Acute left-sided low back pain without sciatica Assessment & Plan: Acute, likely secondary to lumbar degenerative changes.  Recommend home physical therapy and diclofenac as needed in addition to heat and massage.  If symptoms not improving as expected we can consider referral to formal physical therapy or orthopedics.   Cyst of joint of shoulder Assessment & Plan: Acute, no associated pain in shoulder so doubt labral tear.  Possible ganglion cyst versus subcutaneous cyst.  It is unclear if cyst is in skin or definitely associated with shoulder joint.  Will continue to follow given no joint pain but if cyst increases in size, becomes painful or if shoulder joint becomes painful we will refer to orthopedics for further evaluation   Other orders -     Diclofenac Sodium; Take 1 tablet (75 mg total) by mouth 2 (two) times daily as needed for moderate pain.  Dispense: 30 tablet; Refill: 0    No follow-ups on file.   Eliezer Lofts, MD

## 2022-04-14 NOTE — Patient Instructions (Addendum)
Start low  back pain exercises .  Use diclofenac as needed.  If pain not improving contact us for further treatment such as formal physical therapy or referral to back specialist. We will contact you with lab results. If cyst over joint becomes painful or if there is shoulder pain.. call for orthopedic referral.

## 2022-04-14 NOTE — Assessment & Plan Note (Signed)
Acute, no associated pain in shoulder so doubt labral tear.  Possible ganglion cyst versus subcutaneous cyst.  It is unclear if cyst is in skin or definitely associated with shoulder joint.  Will continue to follow given no joint pain but if cyst increases in size, becomes painful or if shoulder joint becomes painful we will refer to orthopedics for further evaluation

## 2022-05-01 ENCOUNTER — Institutional Professional Consult (permissible substitution): Payer: BC Managed Care – PPO | Admitting: Neurology

## 2022-05-23 ENCOUNTER — Ambulatory Visit (INDEPENDENT_AMBULATORY_CARE_PROVIDER_SITE_OTHER): Payer: BC Managed Care – PPO | Admitting: Family Medicine

## 2022-05-23 ENCOUNTER — Encounter (INDEPENDENT_AMBULATORY_CARE_PROVIDER_SITE_OTHER): Payer: Self-pay | Admitting: Family Medicine

## 2022-05-23 VITALS — BP 115/70 | HR 60 | Temp 97.4°F | Ht 63.0 in | Wt 213.0 lb

## 2022-05-23 DIAGNOSIS — Z6833 Body mass index (BMI) 33.0-33.9, adult: Secondary | ICD-10-CM | POA: Insufficient documentation

## 2022-05-23 DIAGNOSIS — Z6838 Body mass index (BMI) 38.0-38.9, adult: Secondary | ICD-10-CM

## 2022-05-23 DIAGNOSIS — Z794 Long term (current) use of insulin: Secondary | ICD-10-CM

## 2022-05-23 DIAGNOSIS — Z6836 Body mass index (BMI) 36.0-36.9, adult: Secondary | ICD-10-CM | POA: Insufficient documentation

## 2022-05-23 DIAGNOSIS — E559 Vitamin D deficiency, unspecified: Secondary | ICD-10-CM

## 2022-05-23 DIAGNOSIS — Z7985 Long-term (current) use of injectable non-insulin antidiabetic drugs: Secondary | ICD-10-CM

## 2022-05-23 DIAGNOSIS — E669 Obesity, unspecified: Secondary | ICD-10-CM

## 2022-05-23 DIAGNOSIS — Z6837 Body mass index (BMI) 37.0-37.9, adult: Secondary | ICD-10-CM

## 2022-05-23 DIAGNOSIS — E1169 Type 2 diabetes mellitus with other specified complication: Secondary | ICD-10-CM

## 2022-05-23 MED ORDER — VITAMIN D (ERGOCALCIFEROL) 1.25 MG (50000 UNIT) PO CAPS: 50000.0000 [IU] | ORAL_CAPSULE | ORAL | 0 refills | Status: DC

## 2022-05-23 MED ORDER — OZEMPIC (2 MG/DOSE) 8 MG/3ML ~~LOC~~ SOPN: 2.0000 mg | PEN_INJECTOR | SUBCUTANEOUS | 0 refills | Status: DC

## 2022-05-23 NOTE — Progress Notes (Unsigned)
Chief Complaint:   OBESITY Monica Clark is here to discuss her progress with her obesity treatment plan along with follow-up of her obesity related diagnoses. Monica Clark is on the Category 2 Plan and states she is following her eating plan approximately 70% of the time. Monica Clark states she is doing 0 minutes 0 times per week.  Today's visit was #: 23 Starting weight: 216 lbs Starting date: 04/16/2018 Today's weight: 213 lbs Today's date: 05/23/2022 Total lbs lost to date: 3 Total lbs lost since last in-office visit: 0  Interim History: Monica Clark has been struggling to meal plan with increased stress recently. She has been missing meals and snacking instead. Her protein level has been decreasing.   Subjective:   1. Type 2 diabetes mellitus with other specified complication, with long-term current use of insulin Monica Clark is on an insulin pump. Her fasting blood sugars mostly range between 130-160's, but sometimes 200's.   2. Vitamin D deficiency Monica Clark is on Vitamin D BID. She denies nausea, vomiting, or muscle weakness but she notes fatigue.   Assessment/Plan:   1. Type 2 diabetes mellitus with other specified complication, with long-term current use of insulin We will refill Ozempic 2 mg once weekly for 1 month. Monica Clark will continue to work on her diet, exercise, and weight loss.   - OZEMPIC, 2 MG/DOSE, 8 MG/3ML SOPN; Inject 2 mg into the skin once a week.  Dispense: 3 mL; Refill: 0  2. Vitamin D deficiency We will refill prescription Vitamin D, and we will recheck labs in 1 month.   - Vitamin D, Ergocalciferol, (DRISDOL) 1.25 MG (50000 UNIT) CAPS capsule; Take 1 capsule (50,000 Units total) by mouth 2 (two) times a week.  Dispense: 8 capsule; Refill: 0  3. BMI 37.0-37.9, adult  4. Obesity,Beginning BMI 38.26 Monica Clark is currently in the action stage of change. As such, her goal is to continue with weight loss efforts. She has agreed to the Category 2 Plan and keeping a food journal and adhering to  recommended goals of 400-500 calories and 30+ grams of protein at lunch daily.   Ideas to have a "snackable" lunch that meets her protein goals and decrease simple carbohydrates were discussed today.   Behavioral modification strategies: no skipping meals.  Monica Clark has agreed to follow-up with our clinic in 4 weeks. She was informed of the importance of frequent follow-up visits to maximize her success with intensive lifestyle modifications for her multiple health conditions.   Objective:   Blood pressure 115/70, pulse 60, temperature (!) 97.4 F (36.3 C), height 5\' 3"  (1.6 m), weight 213 lb (96.6 kg), SpO2 96 %. Body mass index is 37.73 kg/m.  Lab Results  Component Value Date   CREATININE 0.82 01/24/2022   BUN 10 01/24/2022   NA 145 (H) 01/24/2022   K 3.8 01/24/2022   CL 106 01/24/2022   CO2 21 01/24/2022   Lab Results  Component Value Date   ALT 9 01/24/2022   AST 11 01/24/2022   ALKPHOS 100 01/24/2022   BILITOT 0.4 01/24/2022   Lab Results  Component Value Date   HGBA1C 8.1 (H) 01/24/2022   HGBA1C 6.9 (H) 06/21/2021   HGBA1C 9.0 (H) 02/08/2021   HGBA1C 11.0 (H) 06/28/2020   HGBA1C 6.4 (A) 08/05/2019   Lab Results  Component Value Date   INSULIN 27.9 (H) 06/28/2020   INSULIN 8.7 04/16/2018   Lab Results  Component Value Date   TSH 1.990 01/24/2022   Lab Results  Component Value  Date   CHOL 221 (H) 01/24/2022   HDL 38 (L) 01/24/2022   LDLCALC 153 (H) 01/24/2022   LDLDIRECT 110.0 06/21/2021   TRIG 162 (H) 01/24/2022   CHOLHDL 6 06/21/2021   Lab Results  Component Value Date   VD25OH 21.9 (L) 01/24/2022   VD25OH 14.85 (L) 06/21/2021   VD25OH 24.7 (L) 02/08/2021   Lab Results  Component Value Date   WBC 11.6 (H) 04/14/2022   HGB 13.5 04/14/2022   HCT 40.2 04/14/2022   MCV 87.6 04/14/2022   PLT 398 04/14/2022   No results found for: "IRON", "TIBC", "FERRITIN"  Attestation Statements:   Reviewed by clinician on day of visit: allergies,  medications, problem list, medical history, surgical history, family history, social history, and previous encounter notes.   I, Trixie Dredge, am acting as transcriptionist for Dennard Nip, MD.  I have reviewed the above documentation for accuracy and completeness, and I agree with the above. -  Dennard Nip, MD

## 2022-06-05 ENCOUNTER — Institutional Professional Consult (permissible substitution): Payer: BC Managed Care – PPO | Admitting: Neurology

## 2022-06-14 ENCOUNTER — Telehealth: Payer: Self-pay | Admitting: Nurse Practitioner

## 2022-06-14 NOTE — Telephone Encounter (Signed)
Patient left voicemail to reschedule appointments, reached out to patient changed appointment date and time.

## 2022-06-15 ENCOUNTER — Inpatient Hospital Stay: Payer: BC Managed Care – PPO | Admitting: Nurse Practitioner

## 2022-06-19 LAB — HM MAMMOGRAPHY

## 2022-06-20 ENCOUNTER — Ambulatory Visit (INDEPENDENT_AMBULATORY_CARE_PROVIDER_SITE_OTHER): Payer: BC Managed Care – PPO | Admitting: Family Medicine

## 2022-06-20 NOTE — Progress Notes (Signed)
No critical labs need to be addressed urgently. We will discuss labs in detail at upcoming office visit.   

## 2022-06-23 ENCOUNTER — Other Ambulatory Visit (INDEPENDENT_AMBULATORY_CARE_PROVIDER_SITE_OTHER): Payer: Self-pay | Admitting: Family Medicine

## 2022-06-23 DIAGNOSIS — E1169 Type 2 diabetes mellitus with other specified complication: Secondary | ICD-10-CM

## 2022-06-26 NOTE — Progress Notes (Deleted)
Patient Care Team: Excell Seltzer, MD as PCP - General (Family Medicine) Adolphus Birchwood, MD as Consulting Physician (Obstetrics and Gynecology)   CHIEF COMPLAINT: Follow up left breast ADH  Oncology History   No history exists.     CURRENT THERAPY: Surveillance  INTERVAL HISTORY Ms. Ibsen returns for follow-up as scheduled, last seen by Dr. Mosetta Putt 06/14/2021.  Breast MRI I 07/04/2021 showed 7 mm linear focus of enhancement in the retroareolar right breast.  Path was benign and concordant.  She continues surveillance.  Recent mammogram 06/19/2022 was benign.  ROS   Past Medical History:  Diagnosis Date   Breast mass, left    duct mass   Depression    Endometrial hyperplasia    Fuchs' corneal dystrophy    Gallbladder problem    Heart murmur    states no known problems, has never seen a cardiologist   Hypertension    states under control with med., has been on med. x 5 yr.   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HCC)    Nipple discharge 04/2014   left breast   Non-insulin dependent type 2 diabetes mellitus (HCC)    Primary osteoarthritis of right hip 11/04/2021     Past Surgical History:  Procedure Laterality Date   BREAST BIOPSY Left 04/30/2014   Procedure: LEFT BREAST BIOPSY;  Surgeon: Avel Peace, MD;  Location: [facility];  Service: General;  Laterality: Left;   BREAST DUCTAL SYSTEM EXCISION Left 04/30/2014   Procedure: LEFT NIPPLE DUCT EXCISION;  Surgeon: Avel Peace, MD;  Location: [facility];  Service: General;  Laterality: Left;   CESAREAN SECTION  1982   CHOLECYSTECTOMY  1989   ROBOTIC ASSISTED TOTAL HYSTERECTOMY WITH BILATERAL SALPINGO OOPHERECTOMY Bilateral 10/13/2014   Procedure: ROBOTIC ASSISTED TOTAL HYSTERECTOMY WITH BILATERAL SALPINGO OOPHORECTOMY ;  Surgeon: Adolphus Birchwood, MD;  Location: [facility];  Service: Gynecology;  Laterality: Bilateral;     Outpatient Encounter Medications as of  06/27/2022  Medication Sig   diclofenac (VOLTAREN) 75 MG EC tablet Take 1 tablet (75 mg total) by mouth 2 (two) times daily as needed for moderate pain.   empagliflozin (JARDIANCE) 25 MG TABS tablet Take 1 tablet (25 mg total) by mouth daily.   fluconazole (DIFLUCAN) 150 MG tablet Take one pill by mouth now and one in 3 days   glucose blood (ONETOUCH VERIO) test strip 1 each by Other route as needed for other. Use to check blood sugar 2 times a day   insulin degludec (TRESIBA FLEXTOUCH) 100 UNIT/ML FlexTouch Pen As directed 20 units in am.   Insulin Pen Needle 31G X 5 MM MISC Use once a day   metFORMIN (GLUCOPHAGE-XR) 500 MG 24 hr tablet Take 500 mg by mouth daily with breakfast.   OZEMPIC, 2 MG/DOSE, 8 MG/3ML SOPN Inject 2 mg into the skin once a week.   rosuvastatin (CRESTOR) 5 MG tablet Take 1 tablet (5 mg total) by mouth daily.   triamterene-hydrochlorothiazide (MAXZIDE-25) 37.5-25 MG tablet Take 1 tablet by mouth every morning. Reported on 09/03/2015   Vitamin D, Ergocalciferol, (DRISDOL) 1.25 MG (50000 UNIT) CAPS capsule Take 1 capsule (50,000 Units total) by mouth 2 (two) times a week.   No facility-administered encounter medications on file as of 06/27/2022.     There were no vitals filed for this visit. There is no height or weight on file to calculate BMI.   PHYSICAL EXAM GENERAL:alert,  no distress and comfortable SKIN: no rash  EYES: sclera clear NECK: without mass LYMPH:  no palpable cervical or supraclavicular lymphadenopathy  LUNGS: clear with normal breathing effort HEART: regular rate & rhythm, no lower extremity edema ABDOMEN: abdomen soft, non-tender and normal bowel sounds NEURO: alert & oriented x 3 with fluent speech, no focal motor/sensory deficits Breast exam:  PAC without erythema    CBC    Component Value Date/Time   WBC 11.6 (H) 04/14/2022 1638   RBC 4.59 04/14/2022 1638   HGB 13.5 04/14/2022 1638   HGB 14.0 01/24/2022 0746   HGB 13.8 03/03/2016 1226    HCT 40.2 04/14/2022 1638   HCT 40.9 01/24/2022 0746   HCT 41.1 03/03/2016 1226   PLT 398 04/14/2022 1638   PLT 367 01/24/2022 0746   MCV 87.6 04/14/2022 1638   MCV 89 01/24/2022 0746   MCV 91.7 03/03/2016 1226   MCH 29.4 04/14/2022 1638   MCHC 33.6 04/14/2022 1638   RDW 14.3 04/14/2022 1638   RDW 14.5 01/24/2022 0746   RDW 14.1 03/03/2016 1226   LYMPHSABS 3,016 04/14/2022 1638   LYMPHSABS 2.1 01/24/2022 0746   LYMPHSABS 2.8 03/03/2016 1226   MONOABS 0.4 01/08/2020 0911   MONOABS 0.3 03/03/2016 1226   EOSABS 220 04/14/2022 1638   EOSABS 0.2 01/24/2022 0746   BASOSABS 46 04/14/2022 1638   BASOSABS 0.1 01/24/2022 0746   BASOSABS 0.1 03/03/2016 1226     CMP     Component Value Date/Time   NA 145 (H) 01/24/2022 0746   NA 141 03/03/2016 1226   K 3.8 01/24/2022 0746   K 3.5 03/03/2016 1226   CL 106 01/24/2022 0746   CO2 21 01/24/2022 0746   CO2 24 03/03/2016 1226   GLUCOSE 135 (H) 01/24/2022 0746   GLUCOSE 123 (H) 06/21/2021 1029   GLUCOSE 113 03/03/2016 1226   BUN 10 01/24/2022 0746   BUN 8.5 03/03/2016 1226   CREATININE 0.82 01/24/2022 0746   CREATININE 0.82 01/08/2020 0911   CREATININE 0.8 03/03/2016 1226   CALCIUM 9.0 01/24/2022 0746   CALCIUM 10.0 03/03/2016 1226   PROT 7.4 01/24/2022 0746   PROT 8.2 03/03/2016 1226   ALBUMIN 4.1 01/24/2022 0746   ALBUMIN 3.7 03/03/2016 1226   AST 11 01/24/2022 0746   AST 21 01/08/2020 0911   AST 72 (H) 03/03/2016 1226   ALT 9 01/24/2022 0746   ALT 13 01/08/2020 0911   ALT 46 03/03/2016 1226   ALKPHOS 100 01/24/2022 0746   ALKPHOS 118 03/03/2016 1226   BILITOT 0.4 01/24/2022 0746   BILITOT 0.4 01/08/2020 0911   BILITOT 0.61 03/03/2016 1226   GFRNONAA >60 01/08/2020 0911   GFRAA >60 10/21/2018 1338     ASSESSMENT & PLAN:Maleigha A Mongar is a 67 y.o. post-menopausal female with    1. Left breast atypical ductal hyperplasia -diagnosed 04/30/14 with ADH when she underwent left nipple duct excision and surgical biopsy.  -s/p  5 years of chemoprevention with tamoxifen 11/30/14 - 11/2019.  She overall tolerated well. -she presented for annual mammogram with right nipple discharge Mammogram on 05/24/21 was suspicious but [place] was negative. -Breast MRI 07/04/2021 showed 7 mm enhancement, she underwent biopsy 07/14/2021 Path was benign and concordant -She continues surveillance.  Most recent mammogram 06/19/2022 was benign  2. Stage I endometrial cancer, 2016  -s/p total hysterectomy with BSO on 10/13/14 by Dr. Andrey Farmer  -most recent CT AP on 01/12/20 at [facility] was negative.   3. HTN, DM -continue  follow-up with PCP, now seeing Dr. Ermalene Searing.    PLAN:  No orders of the defined types were placed in this encounter.     All questions were answered. The patient knows to call the clinic with any problems, questions or concerns. No barriers to learning were detected. I spent *** counseling the patient face to face. The total time spent in the appointment was *** and more than 50% was on counseling, review of test results, and coordination of care.   [name] Glad, NP-C @DATE @

## 2022-06-27 ENCOUNTER — Inpatient Hospital Stay: Payer: BC Managed Care – PPO | Attending: Nurse Practitioner | Admitting: Nurse Practitioner

## 2022-07-07 ENCOUNTER — Telehealth: Payer: Self-pay | Admitting: Hematology

## 2022-07-07 NOTE — Telephone Encounter (Signed)
Contacted patient to scheduled appointments. Left message with appointment details and a call back number if patient had any questions or could not accommodate the time we provided.   

## 2022-07-15 NOTE — Progress Notes (Deleted)
Patient Care Team: Excell Seltzer, MD as PCP - General (Family Medicine) Adolphus Birchwood, MD as Consulting Physician (Obstetrics and Gynecology)   CHIEF COMPLAINT: Follow up left breast ADH  CURRENT THERAPY: Surveillance   INTERVAL HISTORY Ms. Smisek returns for follow up as scheduled,  last seen by Dr. Mosetta Putt 06/14/21. Breast MRI/bx 07/14/21 showed fibroadenoma and concordant. Mammo 06/19/22 was benign.   ROS   Past Medical History:  Diagnosis Date   Breast mass, left    duct mass   Depression    Endometrial hyperplasia    Fuchs' corneal dystrophy    Gallbladder problem    Heart murmur    states no known problems, has never seen a cardiologist   Hypertension    states under control with med., has been on med. x 5 yr.   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HCC)    Nipple discharge 04/2014   left breast   Non-insulin dependent type 2 diabetes mellitus (HCC)    Primary osteoarthritis of right hip 11/04/2021     Past Surgical History:  Procedure Laterality Date   BREAST BIOPSY Left 04/30/2014   Procedure: LEFT BREAST BIOPSY;  Surgeon: Avel Peace, MD;  Location: [facility];  Service: General;  Laterality: Left;   BREAST DUCTAL SYSTEM EXCISION Left 04/30/2014   Procedure: LEFT NIPPLE DUCT EXCISION;  Surgeon: Avel Peace, MD;  Location: [facility];  Service: General;  Laterality: Left;   CESAREAN SECTION  1982   CHOLECYSTECTOMY  1989   ROBOTIC ASSISTED TOTAL HYSTERECTOMY WITH BILATERAL SALPINGO OOPHERECTOMY Bilateral 10/13/2014   Procedure: ROBOTIC ASSISTED TOTAL HYSTERECTOMY WITH BILATERAL SALPINGO OOPHORECTOMY ;  Surgeon: Adolphus Birchwood, MD;  Location: [facility];  Service: Gynecology;  Laterality: Bilateral;     Outpatient Encounter Medications as of 07/19/2022  Medication Sig   diclofenac (VOLTAREN) 75 MG EC tablet Take 1 tablet (75 mg total) by mouth 2 (two) times daily as needed for moderate pain.    empagliflozin (JARDIANCE) 25 MG TABS tablet Take 1 tablet (25 mg total) by mouth daily.   fluconazole (DIFLUCAN) 150 MG tablet Take one pill by mouth now and one in 3 days   glucose blood (ONETOUCH VERIO) test strip 1 each by Other route as needed for other. Use to check blood sugar 2 times a day   insulin degludec (TRESIBA FLEXTOUCH) 100 UNIT/ML FlexTouch Pen As directed 20 units in am.   Insulin Pen Needle 31G X 5 MM MISC Use once a day   metFORMIN (GLUCOPHAGE-XR) 500 MG 24 hr tablet Take 500 mg by mouth daily with breakfast.   OZEMPIC, 2 MG/DOSE, 8 MG/3ML SOPN Inject 2 mg into the skin once a week.   rosuvastatin (CRESTOR) 5 MG tablet Take 1 tablet (5 mg total) by mouth daily.   triamterene-hydrochlorothiazide (MAXZIDE-25) 37.5-25 MG tablet Take 1 tablet by mouth every morning. Reported on 09/03/2015   Vitamin D, Ergocalciferol, (DRISDOL) 1.25 MG (50000 UNIT) CAPS capsule Take 1 capsule (50,000 Units total) by mouth 2 (two) times a week.   No facility-administered encounter medications on file as of 07/19/2022.     There were no vitals filed for this visit. There is no height or weight on file to calculate BMI.   PHYSICAL EXAM GENERAL:alert, no distress and comfortable SKIN: no rash  EYES: sclera clear NECK: without mass LYMPH:  no palpable cervical or supraclavicular lymphadenopathy  LUNGS: clear with normal breathing effort  HEART: regular rate & rhythm, no lower extremity edema ABDOMEN: abdomen soft, non-tender and normal bowel sounds NEURO: alert & oriented x 3 with fluent speech, no focal motor/sensory deficits Breast exam:  PAC without erythema    CBC    Component Value Date/Time   WBC 11.6 (H) 04/14/2022 1638   RBC 4.59 04/14/2022 1638   HGB 13.5 04/14/2022 1638   HGB 14.0 01/24/2022 0746   HGB 13.8 03/03/2016 1226   HCT 40.2 04/14/2022 1638   HCT 40.9 01/24/2022 0746   HCT 41.1 03/03/2016 1226   PLT 398 04/14/2022 1638   PLT 367 01/24/2022 0746   MCV 87.6 04/14/2022  1638   MCV 89 01/24/2022 0746   MCV 91.7 03/03/2016 1226   MCH 29.4 04/14/2022 1638   MCHC 33.6 04/14/2022 1638   RDW 14.3 04/14/2022 1638   RDW 14.5 01/24/2022 0746   RDW 14.1 03/03/2016 1226   LYMPHSABS 3,016 04/14/2022 1638   LYMPHSABS 2.1 01/24/2022 0746   LYMPHSABS 2.8 03/03/2016 1226   MONOABS 0.4 01/08/2020 0911   MONOABS 0.3 03/03/2016 1226   EOSABS 220 04/14/2022 1638   EOSABS 0.2 01/24/2022 0746   BASOSABS 46 04/14/2022 1638   BASOSABS 0.1 01/24/2022 0746   BASOSABS 0.1 03/03/2016 1226     CMP     Component Value Date/Time   NA 145 (H) 01/24/2022 0746   NA 141 03/03/2016 1226   K 3.8 01/24/2022 0746   K 3.5 03/03/2016 1226   CL 106 01/24/2022 0746   CO2 21 01/24/2022 0746   CO2 24 03/03/2016 1226   GLUCOSE 135 (H) 01/24/2022 0746   GLUCOSE 123 (H) 06/21/2021 1029   GLUCOSE 113 03/03/2016 1226   BUN 10 01/24/2022 0746   BUN 8.5 03/03/2016 1226   CREATININE 0.82 01/24/2022 0746   CREATININE 0.82 01/08/2020 0911   CREATININE 0.8 03/03/2016 1226   CALCIUM 9.0 01/24/2022 0746   CALCIUM 10.0 03/03/2016 1226   PROT 7.4 01/24/2022 0746   PROT 8.2 03/03/2016 1226   ALBUMIN 4.1 01/24/2022 0746   ALBUMIN 3.7 03/03/2016 1226   AST 11 01/24/2022 0746   AST 21 01/08/2020 0911   AST 72 (H) 03/03/2016 1226   ALT 9 01/24/2022 0746   ALT 13 01/08/2020 0911   ALT 46 03/03/2016 1226   ALKPHOS 100 01/24/2022 0746   ALKPHOS 118 03/03/2016 1226   BILITOT 0.4 01/24/2022 0746   BILITOT 0.4 01/08/2020 0911   BILITOT 0.61 03/03/2016 1226   GFRNONAA >60 01/08/2020 0911   GFRAA >60 10/21/2018 1338     ASSESSMENT & PLAN:Allanah A Sawinski is a 67 y.o. post-menopausal female with    1. Left breast atypical lobular hyperplasia, recent right nipple discharge  -diagnosed 04/30/14 with ADH when she underwent left nipple duct excision and surgical biopsy.  -s/p 5 years of chemoprevention with tamoxifen 11/30/14 - 11/2019.  She overall tolerated well. -she presented for annual  mammogram with right nipple discharge for a few month. Mammogram on 05/24/21 was suspicious but [place] was negative. -MRI/bx 06/2021 showed fibroadenoma, benign and concordant -Mammog 05/2022 was benign -Continue annual screening mammogram.  She would like to follow-up with [place] annually   2. Stage I endometrial cancer, 2016  -s/p total hysterectomy with BSO on 10/13/14 by Dr. Andrey Farmer  -most recent CT AP on 01/12/20 at [facility] was negative.   3. HTN, DM -continue follow-up with PCP, now seeing Dr. Ermalene Searing.    PLAN:  No orders of the defined types were placed in  this encounter.     All questions were answered. The patient knows to call the clinic with any problems, questions or concerns. No barriers to learning were detected. I spent *** counseling the patient face to face. The total time spent in the appointment was *** and more than 50% was on counseling, review of test results, and coordination of care.   [name] Glad, NP-C @DATE @

## 2022-07-19 ENCOUNTER — Inpatient Hospital Stay: Payer: BC Managed Care – PPO | Admitting: Nurse Practitioner

## 2022-07-25 ENCOUNTER — Encounter: Payer: Self-pay | Admitting: Family Medicine

## 2022-07-25 ENCOUNTER — Encounter (INDEPENDENT_AMBULATORY_CARE_PROVIDER_SITE_OTHER): Payer: Self-pay | Admitting: Family Medicine

## 2022-07-25 ENCOUNTER — Ambulatory Visit: Payer: BC Managed Care – PPO | Admitting: Family Medicine

## 2022-07-25 ENCOUNTER — Ambulatory Visit (INDEPENDENT_AMBULATORY_CARE_PROVIDER_SITE_OTHER): Payer: BC Managed Care – PPO | Admitting: Family Medicine

## 2022-07-25 VITALS — BP 120/78 | HR 63 | Temp 97.1°F | Ht 63.0 in | Wt 212.0 lb

## 2022-07-25 VITALS — BP 138/78 | HR 59 | Temp 98.3°F | Ht 63.0 in | Wt 209.0 lb

## 2022-07-25 DIAGNOSIS — E559 Vitamin D deficiency, unspecified: Secondary | ICD-10-CM

## 2022-07-25 DIAGNOSIS — E1169 Type 2 diabetes mellitus with other specified complication: Secondary | ICD-10-CM

## 2022-07-25 DIAGNOSIS — E669 Obesity, unspecified: Secondary | ICD-10-CM

## 2022-07-25 DIAGNOSIS — Z8542 Personal history of malignant neoplasm of other parts of uterus: Secondary | ICD-10-CM

## 2022-07-25 DIAGNOSIS — R053 Chronic cough: Secondary | ICD-10-CM | POA: Diagnosis not present

## 2022-07-25 DIAGNOSIS — G8929 Other chronic pain: Secondary | ICD-10-CM

## 2022-07-25 DIAGNOSIS — M545 Low back pain, unspecified: Secondary | ICD-10-CM

## 2022-07-25 DIAGNOSIS — Z6837 Body mass index (BMI) 37.0-37.9, adult: Secondary | ICD-10-CM | POA: Diagnosis not present

## 2022-07-25 DIAGNOSIS — Z7985 Long-term (current) use of injectable non-insulin antidiabetic drugs: Secondary | ICD-10-CM

## 2022-07-25 MED ORDER — VITAMIN D (ERGOCALCIFEROL) 1.25 MG (50000 UNIT) PO CAPS: 50000.0000 [IU] | ORAL_CAPSULE | ORAL | 0 refills | Status: DC

## 2022-07-25 MED ORDER — OZEMPIC (2 MG/DOSE) 8 MG/3ML ~~LOC~~ SOPN: 2.0000 mg | PEN_INJECTOR | SUBCUTANEOUS | 0 refills | Status: DC

## 2022-07-25 MED ORDER — DICLOFENAC SODIUM 75 MG PO TBEC: 75.0000 mg | DELAYED_RELEASE_TABLET | Freq: Two times a day (BID) | ORAL | 0 refills | Status: DC | PRN

## 2022-07-25 NOTE — Assessment & Plan Note (Signed)
Acute, but persisting greater than 3 weeks.  Most consistent with Cough from postnasal drip and allergic rhinitis.  On exam evidence of eustachian tube dysfunction.  Will change Zyrtec to Xyzal and start trial of Flonase 2 sprays per nostril daily.  If not improving as expected consider evaluation with chest x-ray (not due today given non-smoker and clear lung exam ) And prednisone taper.

## 2022-07-25 NOTE — Assessment & Plan Note (Signed)
Chronic, patient does comment of some lower abdominal pressure at times.  She has not seen a gynecologist in a while.  She missed referral in the past year for GYN. Ordered referral to gynecology for reassessment.

## 2022-07-25 NOTE — Progress Notes (Unsigned)
Chief Complaint:   OBESITY Monica Clark is here to discuss her progress with her obesity treatment plan along with follow-up of her obesity related diagnoses. Monica Clark is on the Category 2 Plan and keeping a food journal and adhering to recommended goals of 400-500 calories and 30+ grams of protein at lunch and states she is following her eating plan approximately 75% of the time. Monica Clark states she is doing chair yoga for 20 minutes 2 times per week.  Today's visit was #: 43 Starting weight: 216 lbs Starting date: 04/16/2018 Today's weight: 209 lbs Today's date: 07/25/2022 Total lbs lost to date: 7 Total lbs lost since last in-office visit: 4  Interim History: Patient has done well with weight loss in the last 2 months.  She is working on increasing her protein, but she admits that she needs to continue working on this as well as meal planning/prepping.  Subjective:   1. Type 2 diabetes mellitus with other specified complication, with long-term current use of insulin (HCC) Patient is on Ozempic 2 mg and has no nausea or vomiting, and she denies hypoglycemia.  She notes decreased polyphagia.  2. Vitamin D deficiency Patient's recent vitamin D level was too low.  She is doing well on vitamin D prescription with no side effects.  Assessment/Plan:   1. Type 2 diabetes mellitus with other specified complication, with long-term current use of insulin (HCC) We will refill Ozempic 2 mg for 1 month.  Patient will continue metformin and Jardiance.  - OZEMPIC, 2 MG/DOSE, 8 MG/3ML SOPN; Inject 2 mg into the skin once a week.  Dispense: 3 mL; Refill: 0  2. Vitamin D deficiency Patient will continue prescription vitamin D, and we will refill for 1 month.  - Vitamin D, Ergocalciferol, (DRISDOL) 1.25 MG (50000 UNIT) CAPS capsule; Take 1 capsule (50,000 Units total) by mouth 2 (two) times a week.  Dispense: 8 capsule; Refill: 0  3. BMI 37.0-37.9, adult  4. Obesity, with starting BMI 38.26 Monica Clark is  currently in the action stage of change. As such, her goal is to continue with weight loss efforts. She has agreed to the Category 2 Plan.   Exercise goals: As is.   Behavioral modification strategies: increasing lean protein intake and meal planning and cooking strategies.  Monica Clark has agreed to follow-up with our clinic in 4 weeks. She was informed of the importance of frequent follow-up visits to maximize her success with intensive lifestyle modifications for her multiple health conditions.   Objective:   Blood pressure 138/78, pulse (!) 59, temperature 98.3 F (36.8 C), height 5\' 3"  (1.6 m), weight 209 lb (94.8 kg), SpO2 98 %. Body mass index is 37.02 kg/m.  Lab Results  Component Value Date   CREATININE 0.82 01/24/2022   BUN 10 01/24/2022   NA 145 (H) 01/24/2022   K 3.8 01/24/2022   CL 106 01/24/2022   CO2 21 01/24/2022   Lab Results  Component Value Date   ALT 9 01/24/2022   AST 11 01/24/2022   ALKPHOS 100 01/24/2022   BILITOT 0.4 01/24/2022   Lab Results  Component Value Date   HGBA1C 8.1 (H) 01/24/2022   HGBA1C 6.9 (H) 06/21/2021   HGBA1C 9.0 (H) 02/08/2021   HGBA1C 11.0 (H) 06/28/2020   HGBA1C 6.4 (A) 08/05/2019   Lab Results  Component Value Date   INSULIN 27.9 (H) 06/28/2020   INSULIN 8.7 04/16/2018   Lab Results  Component Value Date   TSH 1.990 01/24/2022  Lab Results  Component Value Date   CHOL 221 (H) 01/24/2022   HDL 38 (L) 01/24/2022   LDLCALC 153 (H) 01/24/2022   LDLDIRECT 110.0 06/21/2021   TRIG 162 (H) 01/24/2022   CHOLHDL 6 06/21/2021   Lab Results  Component Value Date   VD25OH 21.9 (L) 01/24/2022   VD25OH 14.85 (L) 06/21/2021   VD25OH 24.7 (L) 02/08/2021   Lab Results  Component Value Date   WBC 11.6 (H) 04/14/2022   HGB 13.5 04/14/2022   HCT 40.2 04/14/2022   MCV 87.6 04/14/2022   PLT 398 04/14/2022   No results found for: "IRON", "TIBC", "FERRITIN"  Attestation Statements:   Reviewed by clinician on day of visit:  allergies, medications, problem list, medical history, surgical history, family history, social history, and previous encounter notes.   I, Burt Knack, am acting as transcriptionist for Quillian Quince, MD.  I have reviewed the above documentation for accuracy and completeness, and I agree with the above. -  Quillian Quince, MD

## 2022-07-25 NOTE — Assessment & Plan Note (Signed)
Chronic, minimal temporary improvement with diclofenac 75 mg p.o. twice daily and home physical therapy.  Will move forward with referral to formal physical therapy and will refill diclofenac. Plain film showed likely cause as degenerative disc disease versus facet joint arthritis.  Symptoms correspond most at this time with facet joint arthritis. No red flags or indication for urgent MRI. She will follow-up if not improving following physical therapy for consideration of reevaluation

## 2022-07-25 NOTE — Progress Notes (Signed)
Patient ID: Monica Clark, female    DOB: February 18, 1956, 67 y.o.   MRN: [id number]  This visit was conducted in person.  BP 120/78   Pulse 63   Temp (!) 97.1 F (36.2 C) (Temporal)   Ht 5\' 3"  (1.6 m)   Wt 212 lb (96.2 kg)   SpO2 98%   BMI 37.55 kg/m    CC:  Chief Complaint  Patient presents with   Cough    Ongoing for months     Subjective:   HPI: Monica Clark is a 67 y.o. female presenting on 07/25/2022 for Cough (Ongoing for months )   Date of onset:  months ago... Initial symptoms sneeze, nasal rhinorrhea and  dry cough Symptoms progressed to more intermittent symptoms.  No fever  No SOB, no wheeze.  No ST, no PND, ST.  No face pain or pressure Occ headache.  Sick contacts:  none COVID testing:   none     She has tried to treat with  zyrtec. No nasal spray.     No history of chronic lung disease such as asthma or COPD. Non-smoker.    Also continued low back pain... onogoing daily  now for years. Right  right lumbar back pain without sciatica causing right buttock pain was treated in September 2023 and again in 03/2022 with diclofenac 75 mg p.o. twice daily....  some temporary  improvement with home PT and diclofenac. X-ray showed multilevel degenerative disc disease and facet arthropathy.    More pain on right lower  back, in small of back.. no radiation of pain or weakness and numbness in legs.   No incontinence.  Relevant past medical, surgical, family and social history reviewed and updated as indicated. Interim medical history since our last visit reviewed. Allergies and medications reviewed and updated. Outpatient Medications Prior to Visit  Medication Sig Dispense Refill   empagliflozin (JARDIANCE) 25 MG TABS tablet Take 1 tablet (25 mg total) by mouth daily. 90 tablet 0   glucose blood (ONETOUCH VERIO) test strip 1 each by Other route as needed for other. Use to check blood sugar 2 times a day 100 each 0   Insulin Pen Needle 31G X 5 MM MISC Use  once a day 100 each 1   metFORMIN (GLUCOPHAGE-XR) 500 MG 24 hr tablet Take 500 mg by mouth daily with breakfast.     OZEMPIC, 2 MG/DOSE, 8 MG/3ML SOPN Inject 2 mg into the skin once a week. 3 mL 0   rosuvastatin (CRESTOR) 5 MG tablet Take 1 tablet (5 mg total) by mouth daily. 30 tablet 0   triamterene-hydrochlorothiazide (MAXZIDE-25) 37.5-25 MG tablet Take 1 tablet by mouth every morning. Reported on 09/03/2015 30 tablet 0   Vitamin D, Ergocalciferol, (DRISDOL) 1.25 MG (50000 UNIT) CAPS capsule Take 1 capsule (50,000 Units total) by mouth 2 (two) times a week. 8 capsule 0   diclofenac (VOLTAREN) 75 MG EC tablet Take 1 tablet (75 mg total) by mouth 2 (two) times daily as needed for moderate pain. (Patient not taking: Reported on 07/25/2022) 30 tablet 0   fluconazole (DIFLUCAN) 150 MG tablet Take one pill by mouth now and one in 3 days 1 tablet 1   insulin degludec (TRESIBA FLEXTOUCH) 100 UNIT/ML FlexTouch Pen As directed 20 units in am. 15 mL 3   No facility-administered medications prior to visit.     Per HPI unless specifically indicated in ROS section below Review of Systems  Constitutional:  Negative for fatigue  and fever.  HENT:  Negative for ear pain.   Eyes:  Negative for pain.  Respiratory:  Negative for chest tightness and shortness of breath.   Cardiovascular:  Negative for chest pain, palpitations and leg swelling.  Gastrointestinal:  Negative for abdominal pain.  Genitourinary:  Negative for dysuria.   Objective:  BP 120/78   Pulse 63   Temp (!) 97.1 F (36.2 C) (Temporal)   Ht 5\' 3"  (1.6 m)   Wt 212 lb (96.2 kg)   SpO2 98%   BMI 37.55 kg/m   Wt Readings from Last 3 Encounters:  07/25/22 212 lb (96.2 kg)  05/23/22 213 lb (96.6 kg)  04/14/22 215 lb 2 oz (97.6 kg)      Physical Exam Constitutional:      General: She is not in acute distress.    Appearance: Normal appearance. She is well-developed. She is not ill-appearing or toxic-appearing.  HENT:     Head:  Normocephalic.     Right Ear: Hearing, ear canal and external ear normal. A middle ear effusion is present. Tympanic membrane is not erythematous, retracted or bulging.     Left Ear: Hearing, ear canal and external ear normal. A middle ear effusion is present. Tympanic membrane is not erythematous, retracted or bulging.     Nose: No mucosal edema or rhinorrhea.     Right Turbinates: Pale.     Left Turbinates: Pale.     Right Sinus: No maxillary sinus tenderness or frontal sinus tenderness.     Left Sinus: No maxillary sinus tenderness or frontal sinus tenderness.     Mouth/Throat:     Pharynx: Uvula midline.  Eyes:     General: Lids are normal. Lids are everted, no foreign bodies appreciated.     Conjunctiva/sclera: Conjunctivae normal.     Pupils: Pupils are equal, round, and reactive to light.  Neck:     Thyroid: No thyroid mass or thyromegaly.     Vascular: No carotid bruit.     Trachea: Trachea normal.  Cardiovascular:     Rate and Rhythm: Normal rate and regular rhythm.     Pulses: Normal pulses.     Heart sounds: Normal heart sounds, S1 normal and S2 normal. No murmur heard.    No friction rub. No gallop.  Pulmonary:     Effort: Pulmonary effort is normal. No tachypnea or respiratory distress.     Breath sounds: Normal breath sounds. No decreased breath sounds, wheezing, rhonchi or rales.  Abdominal:     General: Bowel sounds are normal.     Palpations: Abdomen is soft.     Tenderness: There is no abdominal tenderness.  Musculoskeletal:     Right shoulder: Tenderness present. Decreased range of motion.     Cervical back: Normal range of motion and neck supple.     Lumbar back: Tenderness present. No bony tenderness. Decreased range of motion. Positive right straight leg raise test. Negative left straight leg raise test.     Right hip: Decreased range of motion.     Comments: Positive empty can, negative drop arm test, negative Spurling bilaterally negative crossover test   Skin:    General: Skin is warm and dry.     Findings: No rash.  Neurological:     Mental Status: She is alert.  Psychiatric:        Mood and Affect: Mood is not anxious or depressed.        Speech: Speech normal.  Behavior: Behavior normal. Behavior is cooperative.        Thought Content: Thought content normal.        Judgment: Judgment normal.       Results for orders placed or performed in visit on 06/20/22  [facility] MAMMOGRAPHY  Result Value Ref Range   HM Mammogram 0-4 Bi-Rad 0-4 Bi-Rad, Self Reported Normal    Assessment and Plan  There are no diagnoses linked to this encounter.  No follow-ups on file.   Kerby Nora, MD

## 2022-07-25 NOTE — Patient Instructions (Addendum)
Flonase 2 sprays per nostril daily.  Change zyrtec to Xyzal.  Can use diclofenac twice daily as needed for back pain.  Start formal PT.   Follow up if low back pain not improving as expected for further evaluation.  Keep GYN referral appt.

## 2022-08-18 ENCOUNTER — Other Ambulatory Visit (INDEPENDENT_AMBULATORY_CARE_PROVIDER_SITE_OTHER): Payer: Self-pay | Admitting: Family Medicine

## 2022-08-18 DIAGNOSIS — E559 Vitamin D deficiency, unspecified: Secondary | ICD-10-CM

## 2022-08-22 ENCOUNTER — Ambulatory Visit (INDEPENDENT_AMBULATORY_CARE_PROVIDER_SITE_OTHER): Payer: BC Managed Care – PPO | Admitting: Family Medicine

## 2022-08-22 ENCOUNTER — Other Ambulatory Visit (INDEPENDENT_AMBULATORY_CARE_PROVIDER_SITE_OTHER): Payer: Self-pay | Admitting: Family Medicine

## 2022-08-22 DIAGNOSIS — E1169 Type 2 diabetes mellitus with other specified complication: Secondary | ICD-10-CM

## 2022-08-28 ENCOUNTER — Ambulatory Visit: Payer: BC Managed Care – PPO | Admitting: "Endocrinology

## 2022-08-28 ENCOUNTER — Telehealth: Payer: Self-pay

## 2022-08-28 ENCOUNTER — Encounter: Payer: Self-pay | Admitting: "Endocrinology

## 2022-08-28 VITALS — BP 130/90 | HR 68 | Ht 63.0 in | Wt 211.4 lb

## 2022-08-28 DIAGNOSIS — E782 Mixed hyperlipidemia: Secondary | ICD-10-CM

## 2022-08-28 DIAGNOSIS — Z7984 Long term (current) use of oral hypoglycemic drugs: Secondary | ICD-10-CM

## 2022-08-28 DIAGNOSIS — E1140 Type 2 diabetes mellitus with diabetic neuropathy, unspecified: Secondary | ICD-10-CM

## 2022-08-28 DIAGNOSIS — E1169 Type 2 diabetes mellitus with other specified complication: Secondary | ICD-10-CM

## 2022-08-28 DIAGNOSIS — Z794 Long term (current) use of insulin: Secondary | ICD-10-CM

## 2022-08-28 LAB — POCT GLYCOSYLATED HEMOGLOBIN (HGB A1C): Hemoglobin A1C: 7.6 % — AB (ref 4.0–5.6)

## 2022-08-28 MED ORDER — ONETOUCH VERIO VI STRP
1.0000 | ORAL_STRIP | 4 refills | Status: DC | PRN
Start: 2022-08-28 — End: 2023-04-03

## 2022-08-28 MED ORDER — ONETOUCH VERIO VI STRP: 1.0000 | ORAL_STRIP | 4 refills | Status: DC | PRN

## 2022-08-28 NOTE — Telephone Encounter (Signed)
Armen Waring Ann Elieser Tetrick, CMA  ?

## 2022-08-28 NOTE — Addendum Note (Signed)
Addended by: Altamese [place] on: 08/28/2022 11:31 AM   Modules accepted: Orders

## 2022-08-28 NOTE — Progress Notes (Signed)
Outpatient Endocrinology Note [facility], [facility]  08/28/22   Monica Clark 19-Mar-1955 [id number]  Referring Provider: Excell Seltzer, MD Primary Care Provider: Excell Seltzer, MD Reason for consultation: Subjective   Assessment & Plan  Monica Clark was seen today for diabetes.  Diagnoses and all orders for this visit:  Type 2 diabetes mellitus with diabetic neuropathy, with long-term current use of insulin (HCC) -     POCT glycosylated hemoglobin (Hb A1C)  Long term (current) use of oral hypoglycemic drugs  Mixed hypercholesterolemia and hypertriglyceridemia  Other orders -     Discontinue: glucose blood (ONETOUCH VERIO) test strip; 1 each by Other route as needed for other. Use to check blood sugar 2 times a day    Diabetes Type II complicated by neuropathy  Lab Results  Component Value Date   GFR 58.95 (L) 06/21/2021   Hba1c goal less than 7, current Hba1c is  Lab Results  Component Value Date   HGBA1C 7.6 (A) 08/28/2022   Jardiance 25 mg daily, Ozempic 2 mg weekly, metformin 500mg  ER 0-2 pill a day Tresiba 25-30 units daily Recommend compliance  Due eye exam  No known contraindications to any of above medications  -Last LD and Tg are as follows: Lab Results  Component Value Date   LDLCALC 153 (H) 01/24/2022    Lab Results  Component Value Date   TRIG 162 (H) 01/24/2022   -On rosuvastatin 5 mg every day, goal LDL <70 -repeat lab with PCP and adjust dose as necessary  -Follow low fat diet and exercise   -Blood pressure goal <140/90 - Microalbumin/creatinine goal < 30 -Last MA/Cr is as follows: Lab Results  Component Value Date   MICROALBUR 0.9 08/16/2020   -not on ACE/ARB -diet changes including salt restriction -limit eating outside -counseled BP targets per standards of diabetes care -uncontrolled blood pressure can lead to retinopathy, nephropathy and cardiovascular and atherosclerotic heart disease  Reviewed and counseled on: -A1C  target -Blood sugar targets -Complications of uncontrolled diabetes  -Checking blood sugar before meals and bedtime and bring log next visit -All medications with mechanism of action and side effects -Hypoglycemia management: rule of 15's, Glucagon Emergency Kit and medical alert ID -low-carb low-fat plate-method diet -At least 20 minutes of physical activity per day -Annual dilated retinal eye exam and foot exam -compliance and follow up needs -follow up as scheduled or earlier if problem gets worse  Call if blood sugar is less than 70 or consistently above 250    Take a 15 gm snack of carbohydrate at bedtime before you go to sleep if your blood sugar is less than 100.    If you are going to fast after midnight for a test or procedure, ask your physician for instructions on how to reduce/decrease your insulin dose.    Call if blood sugar is less than 70 or consistently above 250  -Treating a low sugar by rule of 15  (15 gms of sugar every 15 min until sugar is more than 70) If you feel your sugar is low, test your sugar to be sure If your sugar is low (less than 70), then take 15 grams of a fast acting Carbohydrate (3-4 glucose tablets or glucose gel or 4 ounces of juice or regular soda) Recheck your sugar 15 min after treating low to make sure it is more than 70 If sugar is still less than 70, treat again with 15 grams of carbohydrate  Don't drive the hour of hypoglycemia  If unconscious/unable to eat or drink by mouth, use glucagon injection or nasal spray baqsimi and call 911. Can repeat again in 15 min if still unconscious.  No follow-ups on file. Given diabetes in control, patient is to follow with PCP, informed PCP, patient in agreement   I have reviewed current medications, nurse's notes, allergies, vital signs, past medical and surgical history, family medical history, and social history for this encounter. Counseled patient on symptoms, examination findings, lab  findings, imaging results, treatment decisions and monitoring and prognosis. The patient understood the recommendations and agrees with the treatment plan. All questions regarding treatment plan were fully answered.  Monica Greenwood, MD  08/28/22    History of Present Illness Monica Clark is a 67 y.o. 67 year old female who presents for evaluation of Type II diabetes mellitus.  Monica Clark was first diagnosed in 2010.   Diabetes education +  Home diabetes regimen: Jardiance 25 mg daily, Ozempic 2 mg weekly, metformin 500mg  ER 0-2 pill a day Tresiba 25-30 units daily  Side effects from medications have been: Diarrhea from metformin  COMPLICATIONS -  MI/Stroke -  retinopathy +  neuropathy -  nephropathy  BLOOD SUGAR DATA Did not bring meter  Physical Exam  BP (!) 130/90   Pulse 68   Ht 5\' 3"  (1.6 m)   Wt 211 lb 6.4 oz (95.9 kg)   SpO2 98%   BMI 37.45 kg/m    Constitutional: well developed, well nourished Head: normocephalic, atraumatic Eyes: sclera anicteric, no redness Neck: supple Lungs: normal respiratory effort Neurology: alert and oriented Skin: dry, no appreciable rashes Musculoskeletal: no appreciable defects Psychiatric: normal mood and affect Diabetic Foot Exam - Simple   Simple Foot Form Diabetic Foot exam was performed with the following findings: Yes 08/28/2022 11:22 AM  Visual Inspection No deformities, no ulcerations, no other skin breakdown bilaterally: Yes Sensation Testing Intact to touch and monofilament testing bilaterally: Yes Pulse Check Posterior Tibialis and Dorsalis pulse intact bilaterally: Yes Comments     Current Medications Patient's Medications  New Prescriptions   No medications on file  Previous Medications   DICLOFENAC (VOLTAREN) 75 MG EC TABLET    Take 1 tablet (75 mg total) by mouth 2 (two) times daily as needed for moderate pain.   EMPAGLIFLOZIN (JARDIANCE) 25 MG TABS TABLET    Take 1 tablet (25 mg total) by mouth  daily.   INSULIN DEGLUDEC (TRESIBA) 100 UNIT/ML SOLN    Inject 30 Units into the skin daily.   INSULIN PEN NEEDLE 31G X 5 MM MISC    Use once a day   METFORMIN (GLUCOPHAGE-XR) 500 MG 24 HR TABLET    Take 500 mg by mouth daily with breakfast.   OZEMPIC, 2 MG/DOSE, 8 MG/3ML SOPN    Inject 2 mg into the skin once a week.   ROSUVASTATIN (CRESTOR) 5 MG TABLET    Take 1 tablet (5 mg total) by mouth daily.   TRIAMTERENE-HYDROCHLOROTHIAZIDE (MAXZIDE-25) 37.5-25 MG TABLET    Take 1 tablet by mouth every morning. Reported on 09/03/2015   VITAMIN D, ERGOCALCIFEROL, (DRISDOL) 1.25 MG (50000 UNIT) CAPS CAPSULE    Take 1 capsule (50,000 Units total) by mouth 2 (two) times a week.  Modified Medications   Modified Medication Previous Medication   GLUCOSE BLOOD (ONETOUCH VERIO) TEST STRIP glucose blood (ONETOUCH VERIO) test strip      1 each by Other route as needed for other. Use to  check blood sugar 2 times a day    1 each by Other route as needed for other. Use to check blood sugar 2 times a day  Discontinued Medications   No medications on file    Allergies Allergies  Allergen Reactions   Codeine Other (See Comments)    INSOMNIA    Past Medical History Past Medical History:  Diagnosis Date   Breast mass, left    duct mass   Depression    Endometrial hyperplasia    Fuchs' corneal dystrophy    Gallbladder problem    Heart murmur    states no known problems, has never seen a cardiologist   Hypertension    states under control with med., has been on med. x 5 yr.   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HCC)    Nipple discharge 04/2014   left breast   Non-insulin dependent type 2 diabetes mellitus (HCC)    Primary osteoarthritis of right hip 11/04/2021    Past Surgical History Past Surgical History:  Procedure Laterality Date   BREAST BIOPSY Left 04/30/2014   Procedure: LEFT BREAST BIOPSY;  Surgeon: Avel Peace, MD;  Location: [facility];  Service: General;  Laterality: Left;   BREAST DUCTAL SYSTEM EXCISION Left 04/30/2014   Procedure: LEFT NIPPLE DUCT EXCISION;  Surgeon: Avel Peace, MD;  Location: [facility];  Service: General;  Laterality: Left;   CESAREAN SECTION  1982   CHOLECYSTECTOMY  1989   ROBOTIC ASSISTED TOTAL HYSTERECTOMY WITH BILATERAL SALPINGO OOPHERECTOMY Bilateral 10/13/2014   Procedure: ROBOTIC ASSISTED TOTAL HYSTERECTOMY WITH BILATERAL SALPINGO OOPHORECTOMY ;  Surgeon: Adolphus Birchwood, MD;  Location: [facility];  Service: Gynecology;  Laterality: Bilateral;    Family History family history includes Alcoholism in her father; Breast cancer in her cousin and maternal aunt; Breast cancer (age of onset: 28) in her cousin; Colon cancer in her maternal grandfather; Diabetes in her brother, mother, and sister; Heart disease in her father; Obesity in her mother; Ovarian cancer (age of onset: 73) in her mother; Prostate cancer (age of onset: 10 - 64) in her maternal grandfather; Sudden death in her father; Throat cancer (age of onset: 60 - 35) in her father.  Social History Social History   Socioeconomic History   Marital status: Single    Spouse name: Not on file   Number of children: 3   Years of education: Not on file   Highest education level: Not on file  Occupational History   Not on file  Tobacco Use   Smoking status: Never   Smokeless tobacco: Never  Substance and Sexual Activity   Alcohol use: Yes    Comment: rarely   Drug use: No   Sexual activity: Not on file  Other Topics Concern   Not on file  Social History Narrative    She works for [employer], planning retirement   Social Determinants of Health   Financial Resource Strain: Not on file  Food Insecurity: Not on file  Transportation Needs: Not on file  Physical Activity: Not on file  Stress: Not on file  Social Connections: Not on file  Intimate Partner Violence: Not on file    Lab Results  Component Value  Date   HGBA1C 7.6 (A) 08/28/2022   HGBA1C 8.1 (H) 01/24/2022   HGBA1C 6.9 (H) 06/21/2021   Lab Results  Component Value Date   CHOL 221 (H) 01/24/2022   Lab  Results  Component Value Date   HDL 38 (L) 01/24/2022   Lab Results  Component Value Date   LDLCALC 153 (H) 01/24/2022   Lab Results  Component Value Date   TRIG 162 (H) 01/24/2022   Lab Results  Component Value Date   CHOLHDL 6 06/21/2021   Lab Results  Component Value Date   CREATININE 0.82 01/24/2022   Lab Results  Component Value Date   GFR 58.95 (L) 06/21/2021   Lab Results  Component Value Date   MICROALBUR 0.9 08/16/2020      Component Value Date/Time   NA 145 (H) 01/24/2022 0746   NA 141 03/03/2016 1226   K 3.8 01/24/2022 0746   K 3.5 03/03/2016 1226   CL 106 01/24/2022 0746   CO2 21 01/24/2022 0746   CO2 24 03/03/2016 1226   GLUCOSE 135 (H) 01/24/2022 0746   GLUCOSE 123 (H) 06/21/2021 1029   GLUCOSE 113 03/03/2016 1226   BUN 10 01/24/2022 0746   BUN 8.5 03/03/2016 1226   CREATININE 0.82 01/24/2022 0746   CREATININE 0.82 01/08/2020 0911   CREATININE 0.8 03/03/2016 1226   CALCIUM 9.0 01/24/2022 0746   CALCIUM 10.0 03/03/2016 1226   PROT 7.4 01/24/2022 0746   PROT 8.2 03/03/2016 1226   ALBUMIN 4.1 01/24/2022 0746   ALBUMIN 3.7 03/03/2016 1226   AST 11 01/24/2022 0746   AST 21 01/08/2020 0911   AST 72 (H) 03/03/2016 1226   ALT 9 01/24/2022 0746   ALT 13 01/08/2020 0911   ALT 46 03/03/2016 1226   ALKPHOS 100 01/24/2022 0746   ALKPHOS 118 03/03/2016 1226   BILITOT 0.4 01/24/2022 0746   BILITOT 0.4 01/08/2020 0911   BILITOT 0.61 03/03/2016 1226   GFRNONAA >60 01/08/2020 0911   GFRAA >60 10/21/2018 1338      Latest Ref Rng & Units 01/24/2022    7:46 AM 07/13/2021    2:27 PM 06/21/2021   10:29 AM  BMP  Glucose 70 - 99 mg/dL 161  94  096   BUN 8 - 27 mg/dL 10  11  10    Creatinine 0.57 - 1.00 mg/dL 0.45  4.09  8.11   BUN/Creat Ratio 12 - 28 12  13     Sodium 134 - 144 mmol/L 145  143   139   Potassium 3.5 - 5.2 mmol/L 3.8  3.8  3.3   Chloride 96 - 106 mmol/L 106  102  107   CO2 20 - 29 mmol/L 21  26  23    Calcium 8.7 - 10.3 mg/dL 9.0  9.5  8.6        Component Value Date/Time   WBC 11.6 (H) 04/14/2022 1638   RBC 4.59 04/14/2022 1638   HGB 13.5 04/14/2022 1638   HGB 14.0 01/24/2022 0746   HGB 13.8 03/03/2016 1226   HCT 40.2 04/14/2022 1638   HCT 40.9 01/24/2022 0746   HCT 41.1 03/03/2016 1226   PLT 398 04/14/2022 1638   PLT 367 01/24/2022 0746   MCV 87.6 04/14/2022 1638   MCV 89 01/24/2022 0746   MCV 91.7 03/03/2016 1226   MCH 29.4 04/14/2022 1638   MCHC 33.6 04/14/2022 1638   RDW 14.3 04/14/2022 1638   RDW 14.5 01/24/2022 0746   RDW 14.1 03/03/2016 1226   LYMPHSABS 3,016 04/14/2022 1638   LYMPHSABS 2.1 01/24/2022 0746   LYMPHSABS 2.8 03/03/2016 1226   MONOABS 0.4 01/08/2020 0911   MONOABS 0.3 03/03/2016 1226   EOSABS 220 04/14/2022 1638  EOSABS 0.2 01/24/2022 0746   BASOSABS 46 04/14/2022 1638   BASOSABS 0.1 01/24/2022 0746   BASOSABS 0.1 03/03/2016 1226     Parts of this note may have been dictated using voice recognition software. There may be variances in spelling and vocabulary which are unintentional. Not all errors are proofread. Please notify the Thereasa Parkin if any discrepancies are noted or if the meaning of any statement is not clear.

## 2022-08-29 ENCOUNTER — Other Ambulatory Visit (INDEPENDENT_AMBULATORY_CARE_PROVIDER_SITE_OTHER): Payer: Self-pay | Admitting: Physician Assistant

## 2022-08-29 DIAGNOSIS — Z794 Long term (current) use of insulin: Secondary | ICD-10-CM

## 2022-09-07 ENCOUNTER — Ambulatory Visit (INDEPENDENT_AMBULATORY_CARE_PROVIDER_SITE_OTHER): Payer: BC Managed Care – PPO | Admitting: Family Medicine

## 2022-09-07 ENCOUNTER — Encounter (INDEPENDENT_AMBULATORY_CARE_PROVIDER_SITE_OTHER): Payer: Self-pay | Admitting: Family Medicine

## 2022-09-07 VITALS — BP 133/51 | HR 68 | Temp 98.0°F | Ht 63.0 in | Wt 212.0 lb

## 2022-09-07 DIAGNOSIS — Z6837 Body mass index (BMI) 37.0-37.9, adult: Secondary | ICD-10-CM

## 2022-09-07 DIAGNOSIS — E1169 Type 2 diabetes mellitus with other specified complication: Secondary | ICD-10-CM | POA: Diagnosis not present

## 2022-09-07 DIAGNOSIS — I10 Essential (primary) hypertension: Secondary | ICD-10-CM | POA: Diagnosis not present

## 2022-09-07 DIAGNOSIS — Z7985 Long-term (current) use of injectable non-insulin antidiabetic drugs: Secondary | ICD-10-CM

## 2022-09-07 DIAGNOSIS — Z794 Long term (current) use of insulin: Secondary | ICD-10-CM

## 2022-09-07 DIAGNOSIS — E669 Obesity, unspecified: Secondary | ICD-10-CM | POA: Diagnosis not present

## 2022-09-07 MED ORDER — TRIAMTERENE-HCTZ 37.5-25 MG PO TABS: 1.0000 | ORAL_TABLET | Freq: Every morning | ORAL | 0 refills | Status: DC

## 2022-09-07 MED ORDER — OZEMPIC (2 MG/DOSE) 8 MG/3ML ~~LOC~~ SOPN
2.0000 mg | PEN_INJECTOR | SUBCUTANEOUS | 0 refills | Status: DC
Start: 2022-09-07 — End: 2022-10-26

## 2022-09-07 NOTE — Progress Notes (Signed)
.[facility]  Office: [phone]  /  Fax: [phone]  WEIGHT SUMMARY AND BIOMETRICS  Anthropometric Measurements Height: 5\' 3"  (1.6 m) Weight: 212 lb (96.2 kg) BMI (Calculated): 37.56 Weight at Last Visit: 209 lb Weight Lost Since Last Visit: 0 Weight Gained Since Last Visit: 3 lb Starting Weight: 216 lb Total Weight Loss (lbs): 4 lb (1.814 kg)   Body Composition  Body Fat %: 43.8 % Fat Mass (lbs): 93.2 lbs Muscle Mass (lbs): 113.6 lbs Total Body Water (lbs): 84.2 lbs Visceral Fat Rating : 14   Other Clinical Data Fasting: No Labs: No Today's Visit #: 54 Starting Date: 04/16/18    Chief Complaint: OBESITY     History of Present Illness   The patient is a 67 year old individual with a history of diabetes and obesity. She reports adherence to her category two eating plan approximately 75% of the time, but has not been able to exercise. Over the past six weeks, she has gained three pounds. Her last hemoglobin A1c was 7.6, an improvement from 8.1. She is currently on Ozempic 2 mg once a week and reports no problems with this medication, but expresses doubts about its effectiveness in controlling her blood sugar and hunger. She has not tried other medications due to high copay costs.  The patient also reports a lack of physical activity, with a recent attempt at bowling resulting in leg strain. She plans to resume chair yoga and other exercises in the near future. She expresses a struggle with cravings for sweet, salty, and pasta foods, particularly before bedtime, which she attributes to a feeling of deprivation if she does not have a snack. She has been drinking a lot of water and has noticed an increase in weight, which she attributes to water retention.  The patient's blood pressure is controlled at 133 over 51 on maxide, and she requests a refill for this medication. She also takes [place] for her diabetes. She has not noticed any problems with her kidney  function, which was last tested around Christmas and found to be normal.          PHYSICAL EXAM:  Blood pressure (!) 133/51, pulse 68, temperature 98 F (36.7 C), height 5\' 3"  (1.6 m), weight 212 lb (96.2 kg), SpO2 95%. Body mass index is 37.55 kg/m.  DIAGNOSTIC DATA REVIEWED:  BMET    Component Value Date/Time   NA 145 (H) 01/24/2022 0746   NA 141 03/03/2016 1226   K 3.8 01/24/2022 0746   K 3.5 03/03/2016 1226   CL 106 01/24/2022 0746   CO2 21 01/24/2022 0746   CO2 24 03/03/2016 1226   GLUCOSE 135 (H) 01/24/2022 0746   GLUCOSE 123 (H) 06/21/2021 1029   GLUCOSE 113 03/03/2016 1226   BUN 10 01/24/2022 0746   BUN 8.5 03/03/2016 1226   CREATININE 0.82 01/24/2022 0746   CREATININE 0.82 01/08/2020 0911   CREATININE 0.8 03/03/2016 1226   CALCIUM 9.0 01/24/2022 0746   CALCIUM 10.0 03/03/2016 1226   GFRNONAA >60 01/08/2020 0911   GFRAA >60 10/21/2018 1338   Lab Results  Component Value Date   HGBA1C 7.6 (A) 08/28/2022   HGBA1C (H) 10/24/2008    7.2 (NOTE) The ADA recommends the following therapeutic goal for glycemic control related to Hgb A1c measurement: Goal of therapy: <6.5 Hgb A1c  Reference: American Diabetes Association: Clinical Practice Recommendations 2010, Diabetes Care, 2010, 33: (Suppl  1).   Lab Results  Component Value Date   INSULIN 27.9 (H) 06/28/2020  INSULIN 8.7 04/16/2018   Lab Results  Component Value Date   TSH 1.990 01/24/2022   CBC    Component Value Date/Time   WBC 11.6 (H) 04/14/2022 1638   RBC 4.59 04/14/2022 1638   HGB 13.5 04/14/2022 1638   HGB 14.0 01/24/2022 0746   HGB 13.8 03/03/2016 1226   HCT 40.2 04/14/2022 1638   HCT 40.9 01/24/2022 0746   HCT 41.1 03/03/2016 1226   PLT 398 04/14/2022 1638   PLT 367 01/24/2022 0746   MCV 87.6 04/14/2022 1638   MCV 89 01/24/2022 0746   MCV 91.7 03/03/2016 1226   MCH 29.4 04/14/2022 1638   MCHC 33.6 04/14/2022 1638   RDW 14.3 04/14/2022 1638   RDW 14.5 01/24/2022 0746   RDW 14.1  03/03/2016 1226   Iron Studies No results found for: "IRON", "TIBC", "FERRITIN", "IRONPCTSAT" Lipid Panel     Component Value Date/Time   CHOL 221 (H) 01/24/2022 0746   TRIG 162 (H) 01/24/2022 0746   HDL 38 (L) 01/24/2022 0746   CHOLHDL 6 06/21/2021 1029   VLDL 46.0 (H) 06/21/2021 1029   LDLCALC 153 (H) 01/24/2022 0746   LDLDIRECT 110.0 06/21/2021 1029   Hepatic Function Panel     Component Value Date/Time   PROT 7.4 01/24/2022 0746   PROT 8.2 03/03/2016 1226   ALBUMIN 4.1 01/24/2022 0746   ALBUMIN 3.7 03/03/2016 1226   AST 11 01/24/2022 0746   AST 21 01/08/2020 0911   AST 72 (H) 03/03/2016 1226   ALT 9 01/24/2022 0746   ALT 13 01/08/2020 0911   ALT 46 03/03/2016 1226   ALKPHOS 100 01/24/2022 0746   ALKPHOS 118 03/03/2016 1226   BILITOT 0.4 01/24/2022 0746   BILITOT 0.4 01/08/2020 0911   BILITOT 0.61 03/03/2016 1226      Component Value Date/Time   TSH 1.990 01/24/2022 0746   Nutritional Lab Results  Component Value Date   VD25OH 21.9 (L) 01/24/2022   VD25OH 14.85 (L) 06/21/2021   VD25OH 24.7 (L) 02/08/2021     Assessment and Plan    Type 2 Diabetes Mellitus: Improved HbA1c from 8.1 to 7.6. Patient reports not feeling the effects of Ozempic and has concerns about its efficacy. Discussed the importance of consistent nutrition and protein intake. -Continue Ozempic 2mg  once weekly. -Consider alternative medication options if copay for Jardiance is manageable. -Encourage patient to monitor food intake and ensure adequate protein consumption.  Obesity: Patient has gained 3 pounds in the last six weeks. Discussed the importance of regular exercise and maintaining a balanced diet. -Encourage patient to continue with category two eating plan. -Recommend use of exercise bands for upper body strengthening. -Encourage patient to monitor food intake and ensure adequate protein consumption.  Hypertension: Blood pressure controlled at 133/51 on Maxzide. -Refill Maxzide  prescription.   -Schedule follow-up appointment in 4 weeks.       She was informed of the importance of frequent follow up visits to maximize her success with intensive lifestyle modifications for her multiple health conditions.    Quillian Quince, MD

## 2022-10-05 ENCOUNTER — Ambulatory Visit (INDEPENDENT_AMBULATORY_CARE_PROVIDER_SITE_OTHER): Payer: BC Managed Care – PPO | Admitting: Family Medicine

## 2022-10-06 ENCOUNTER — Other Ambulatory Visit (INDEPENDENT_AMBULATORY_CARE_PROVIDER_SITE_OTHER): Payer: Self-pay | Admitting: Family Medicine

## 2022-10-06 DIAGNOSIS — E1169 Type 2 diabetes mellitus with other specified complication: Secondary | ICD-10-CM

## 2022-10-19 ENCOUNTER — Ambulatory Visit: Payer: BC Managed Care – PPO | Admitting: Family Medicine

## 2022-10-19 ENCOUNTER — Encounter: Payer: Self-pay | Admitting: Family Medicine

## 2022-10-19 VITALS — BP 118/68 | HR 61 | Temp 98.0°F | Ht 63.0 in | Wt 212.5 lb

## 2022-10-19 DIAGNOSIS — G8929 Other chronic pain: Secondary | ICD-10-CM | POA: Diagnosis not present

## 2022-10-19 DIAGNOSIS — M25551 Pain in right hip: Secondary | ICD-10-CM | POA: Diagnosis not present

## 2022-10-19 DIAGNOSIS — M25561 Pain in right knee: Secondary | ICD-10-CM | POA: Diagnosis not present

## 2022-10-19 MED ORDER — DICLOFENAC SODIUM 75 MG PO TBEC: 75.0000 mg | DELAYED_RELEASE_TABLET | Freq: Two times a day (BID) | ORAL | 0 refills | Status: AC | PRN

## 2022-10-19 NOTE — Assessment & Plan Note (Signed)
Acute, most likely patellofemoral syndrome versus osteoarthritis of knee.  No clear sign of meniscal injury or ligament injury. Recommend treating with ice elevation and starting diclofenac 75 mg p.o. twice daily.  If not improving as expected in the next 2 to 4 weeks we can proceed with follow-up and x-ray of right knee depending on results consider referral to sports medicine for possible steroid injection.

## 2022-10-19 NOTE — Progress Notes (Signed)
Patient ID: Monica Clark, female    DOB: October 20, 1955, 67 y.o.   MRN: [id number]  This visit was conducted in person.  BP 118/68 (BP Location: Right Arm, Patient Position: Sitting, Cuff Size: Large)   Pulse 61   Temp 98 F (36.7 C) (Temporal)   Ht 5\' 3"  (1.6 m)   Wt 212 lb 8 oz (96.4 kg)   SpO2 99%   BMI 37.64 kg/m    CC:  Chief Complaint  Patient presents with   Knee Pain    Right-Started Saturday Morning   Groin Pain    Right    Subjective:   HPI: Monica Clark is a 67 y.o. female presenting on 10/19/2022 for Knee Pain (Right-Started Saturday Morning) and Groin Pain (Right)  Hx of osteoarthritis in  right hip, back DDD  New onset pain in right knee and right groin in last week.  Awoke with pain in morning, right knee swollen.  Some pain with bending knee.  Has improved in last few days using  massage and elevate.  Using advil dual action 3 tablet every 4 hour for 2 days.  Now pain radiating to groin.   No redness, no heat.   No ankle swelling    Has not been taking Diclofenac 75 mg BID   No change in activity  No new falls.  Relevant past medical, surgical, family and social history reviewed and updated as indicated. Interim medical history since our last visit reviewed. Allergies and medications reviewed and updated. Outpatient Medications Prior to Visit  Medication Sig Dispense Refill   empagliflozin (JARDIANCE) 25 MG TABS tablet Take 1 tablet (25 mg total) by mouth daily. 90 tablet 0   glucose blood (ONETOUCH VERIO) test strip 1 each by Other route as needed for other. Use to check blood sugar 2 times a day 100 each 4   glucose blood (ONETOUCH VERIO) test strip 1 each by Other route as needed for other. Use to check blood sugar 2 times a day 100 each 4   Insulin Degludec (TRESIBA) 100 UNIT/ML SOLN Inject 30 Units into the skin daily.     Insulin Pen Needle 31G X 5 MM MISC Use once a day 100 each 1   metFORMIN (GLUCOPHAGE-XR) 500 MG 24 hr tablet Take 500  mg by mouth daily with breakfast.     OZEMPIC, 2 MG/DOSE, 8 MG/3ML SOPN Inject 2 mg into the skin once a week. 3 mL 0   rosuvastatin (CRESTOR) 5 MG tablet Take 1 tablet (5 mg total) by mouth daily. 30 tablet 0   triamterene-hydrochlorothiazide (MAXZIDE-25) 37.5-25 MG tablet Take 1 tablet by mouth every morning. 30 tablet 0   Vitamin D, Ergocalciferol, (DRISDOL) 1.25 MG (50000 UNIT) CAPS capsule Take 1 capsule (50,000 Units total) by mouth 2 (two) times a week. 8 capsule 0   diclofenac (VOLTAREN) 75 MG EC tablet Take 1 tablet (75 mg total) by mouth 2 (two) times daily as needed for moderate pain. 30 tablet 0   No facility-administered medications prior to visit.     Per HPI unless specifically indicated in ROS section below Review of Systems  Constitutional:  Negative for chills and fever.  HENT:  Negative for congestion and ear pain.   Eyes:  Negative for pain and redness.  Respiratory:  Negative for cough and shortness of breath.   Cardiovascular:  Negative for chest pain, palpitations and leg swelling.  Gastrointestinal:  Negative for abdominal pain, blood in stool, constipation, diarrhea,  nausea and vomiting.  Genitourinary:  Negative for dysuria.  Musculoskeletal:  Negative for myalgias.  Skin:  Negative for rash.  Neurological:  Negative for dizziness.  Psychiatric/Behavioral:  The patient is not nervous/anxious.    Objective:  BP 118/68 (BP Location: Right Arm, Patient Position: Sitting, Cuff Size: Large)   Pulse 61   Temp 98 F (36.7 C) (Temporal)   Ht 5\' 3"  (1.6 m)   Wt 212 lb 8 oz (96.4 kg)   SpO2 99%   BMI 37.64 kg/m   Wt Readings from Last 3 Encounters:  10/19/22 212 lb 8 oz (96.4 kg)  09/07/22 212 lb (96.2 kg)  08/28/22 211 lb 6.4 oz (95.9 kg)      Physical Exam Constitutional:      General: She is not in acute distress.    Appearance: Normal appearance. She is well-developed. She is not ill-appearing or toxic-appearing.  HENT:     Head: Normocephalic.      Right Ear: Hearing, tympanic membrane, ear canal and external ear normal. Tympanic membrane is not erythematous, retracted or bulging.     Left Ear: Hearing, tympanic membrane, ear canal and external ear normal. Tympanic membrane is not erythematous, retracted or bulging.     Nose: No mucosal edema or rhinorrhea.     Right Sinus: No maxillary sinus tenderness or frontal sinus tenderness.     Left Sinus: No maxillary sinus tenderness or frontal sinus tenderness.     Mouth/Throat:     Mouth: Oropharynx is clear and moist and mucous membranes are normal.     Pharynx: Uvula midline.  Eyes:     General: Lids are normal. Lids are everted, no foreign bodies appreciated.     Extraocular Movements: EOM normal.     Conjunctiva/sclera: Conjunctivae normal.     Pupils: Pupils are equal, round, and reactive to light.  Neck:     Thyroid: No thyroid mass or thyromegaly.     Vascular: No carotid bruit.     Trachea: Trachea normal.  Cardiovascular:     Rate and Rhythm: Normal rate and regular rhythm.     Pulses: Normal pulses.     Heart sounds: Normal heart sounds, S1 normal and S2 normal. No murmur heard.    No friction rub. No gallop.  Pulmonary:     Effort: Pulmonary effort is normal. No tachypnea or respiratory distress.     Breath sounds: Normal breath sounds. No decreased breath sounds, wheezing, rhonchi or rales.  Abdominal:     General: Bowel sounds are normal.     Palpations: Abdomen is soft.     Tenderness: There is no abdominal tenderness.  Musculoskeletal:     Cervical back: Normal range of motion and neck supple.     Right hip: Tenderness present. Decreased range of motion.     Left hip: Decreased range of motion.     Right knee: Swelling and crepitus present. No effusion, erythema or bony tenderness. Decreased range of motion. Normal meniscus and normal patellar mobility.     Instability Tests: Anterior drawer test negative. Posterior drawer test negative. Anterior Lachman test  negative. Medial McMurray test negative and lateral McMurray test negative.     Right lower leg: No swelling or tenderness.     Comments: Only area of tenderness to palpation over patella  Skin:    General: Skin is warm, dry and intact.     Findings: No rash.  Neurological:     Mental Status: She is alert.  Psychiatric:        Mood and Affect: Mood is not anxious or depressed.        Speech: Speech normal.        Behavior: Behavior normal. Behavior is cooperative.        Thought Content: Thought content normal.        Cognition and Memory: Cognition and memory normal.        Judgment: Judgment normal.       Results for orders placed or performed in visit on 08/28/22  POCT glycosylated hemoglobin (Hb A1C)  Result Value Ref Range   Hemoglobin A1C 7.6 (A) 4.0 - 5.6 %   HbA1c POC (<> result, manual entry)     HbA1c, POC (prediabetic range)     HbA1c, POC (controlled diabetic range)      Assessment and Plan  Acute pain of right knee Assessment & Plan: Acute, most likely patellofemoral syndrome versus osteoarthritis of knee.  No clear sign of meniscal injury or ligament injury. Recommend treating with ice elevation and starting diclofenac 75 mg p.o. twice daily.  If not improving as expected in the next 2 to 4 weeks we can proceed with follow-up and x-ray of right knee depending on results consider referral to sports medicine for possible steroid injection.   Chronic right hip pain Assessment & Plan: Acute flare of chronic issue, possibly secondary to recent knee pain and change in gait.  Can use anti-inflammatory as well for this but given intermittent issues with this we discussed repeat x-ray of right hip to determine if there has been progression of osteoarthritis that was seen on imaging in 2022.   Other orders -     Diclofenac Sodium; Take 1 tablet (75 mg total) by mouth 2 (two) times daily as needed for moderate pain.  Dispense: 30 tablet; Refill: 0    No follow-ups  on file.   Kerby Nora, MD

## 2022-10-19 NOTE — Assessment & Plan Note (Signed)
Acute flare of chronic issue, possibly secondary to recent knee pain and change in gait.  Can use anti-inflammatory as well for this but given intermittent issues with this we discussed repeat x-ray of right hip to determine if there has been progression of osteoarthritis that was seen on imaging in 2022.

## 2022-10-24 LAB — HM DIABETES EYE EXAM

## 2022-10-26 ENCOUNTER — Encounter (INDEPENDENT_AMBULATORY_CARE_PROVIDER_SITE_OTHER): Payer: Self-pay | Admitting: Family Medicine

## 2022-10-26 ENCOUNTER — Ambulatory Visit (INDEPENDENT_AMBULATORY_CARE_PROVIDER_SITE_OTHER): Payer: BC Managed Care – PPO | Admitting: Family Medicine

## 2022-10-26 VITALS — BP 121/73 | HR 59 | Temp 97.7°F | Ht 63.0 in | Wt 205.0 lb

## 2022-10-26 DIAGNOSIS — R5383 Other fatigue: Secondary | ICD-10-CM | POA: Diagnosis not present

## 2022-10-26 DIAGNOSIS — I10 Essential (primary) hypertension: Secondary | ICD-10-CM | POA: Diagnosis not present

## 2022-10-26 DIAGNOSIS — Z6836 Body mass index (BMI) 36.0-36.9, adult: Secondary | ICD-10-CM

## 2022-10-26 DIAGNOSIS — Z794 Long term (current) use of insulin: Secondary | ICD-10-CM

## 2022-10-26 DIAGNOSIS — E7849 Other hyperlipidemia: Secondary | ICD-10-CM

## 2022-10-26 DIAGNOSIS — E782 Mixed hyperlipidemia: Secondary | ICD-10-CM | POA: Diagnosis not present

## 2022-10-26 DIAGNOSIS — E1169 Type 2 diabetes mellitus with other specified complication: Secondary | ICD-10-CM

## 2022-10-26 DIAGNOSIS — Z7985 Long-term (current) use of injectable non-insulin antidiabetic drugs: Secondary | ICD-10-CM

## 2022-10-26 DIAGNOSIS — E669 Obesity, unspecified: Secondary | ICD-10-CM

## 2022-10-26 MED ORDER — LISINOPRIL 5 MG PO TABS
5.0000 mg | ORAL_TABLET | Freq: Every day | ORAL | 0 refills | Status: DC
Start: 2022-10-26 — End: 2023-02-13

## 2022-10-26 MED ORDER — OZEMPIC (2 MG/DOSE) 8 MG/3ML ~~LOC~~ SOPN: 2.0000 mg | PEN_INJECTOR | SUBCUTANEOUS | 0 refills | Status: DC

## 2022-10-26 MED ORDER — TRIAMTERENE-HCTZ 37.5-25 MG PO TABS
1.0000 | ORAL_TABLET | Freq: Every morning | ORAL | 0 refills | Status: DC
Start: 2022-10-26 — End: 2023-02-15

## 2022-10-26 MED ORDER — INSULIN DEGLUDEC 100 UNIT/ML ~~LOC~~ SOLN
30.0000 [IU] | Freq: Every day | SUBCUTANEOUS | 0 refills | Status: DC
Start: 2022-10-26 — End: 2023-01-11

## 2022-10-26 MED ORDER — ROSUVASTATIN CALCIUM 5 MG PO TABS
5.0000 mg | ORAL_TABLET | Freq: Every day | ORAL | 0 refills | Status: DC
Start: 2022-10-26 — End: 2023-02-15

## 2022-10-26 NOTE — Progress Notes (Signed)
Chief Complaint:   OBESITY Monica Clark is here to discuss her progress with her obesity treatment plan along with follow-up of her obesity related diagnoses. Monica Clark is on the Category 2 Plan and states she is following her eating plan approximately 80% of the time. Monica Clark states she is doing chair yoga for 30 minutes 2 times per week.  Today's visit was #: 45 Starting weight: 216 lbs Starting date: 04/16/2018 Today's weight: 205 lbs Today's date: 10/26/2022 Total lbs lost to date: 11 Total lbs lost since last in-office visit: 7  Interim History: Patient is doing better with being mindful of her food choices. Her hunger is controlled. She is missing breakfast at times and may not be meeting her protein goal regularly.   Subjective:   1. Essential hypertension Patient's blood pressure is controlled today. She is on Maxzide with no side effects noted.   2. Type 2 diabetes mellitus with other specified complication, with long-term current use of insulin (HCC) Patient is on Jardiance, metformin, and Ozempic. She has been out of her Degludec  and she states her glucose has increased. She did not bring her blood sugar log today. Her last A1c improved at 7.6. Her goal A1c is below 7.  3. Mixed hyperlipidemia Patient is on Crestor, and she is working on her diet. No side effects were noted on Crestor.   4. Daytime Fatigue Patient is still struggling with disordered sleep and she notes mid afternoon sleepiness. Her glucose level has increased which also might be contributing to her fatigue.   Assessment/Plan:   1. Essential hypertension We will check labs today. Patient agreed to start lisinopril 5 mg once daily with no refills for renal protection; we will refill Maxzide 37.5-25 mg for 1 month.  - triamterene-hydrochlorothiazide (MAXZIDE-25) 37.5-25 MG tablet; Take 1 tablet by mouth every morning.  Dispense: 30 tablet; Refill: 0 - CMP14+EGFR - lisinopril (ZESTRIL) 5 MG tablet; Take 1 tablet  (5 mg total) by mouth daily.  Dispense: 30 tablet; Refill: 0  2. Type 2 diabetes mellitus with other specified complication, with long-term current use of insulin (HCC) We will check labs today.  Patient will continue her medications, and we will refill Ozempic 2 mg once weekly and degludec 30 units for 1 month.  - Insulin Degludec (TRESIBA) 100 UNIT/ML SOLN; Inject 30 Units into the skin daily.  Dispense: 10 mL; Refill: 0 - OZEMPIC, 2 MG/DOSE, 8 MG/3ML SOPN; Inject 2 mg into the skin once a week.  Dispense: 3 mL; Refill: 0 - Microalbumin / creatinine urine ratio  3. Mixed hyperlipidemia We will check labs today, and we will refill Crestor 5 mg daily #30 for 1 month.  - Lipid Panel With LDL/HDL Ratio  4. Daytime Fatigue We will check labs today.  Patient was offered a referral to sleep specialist and will let me know if she decides to see them.  We will work on glucose control and weight loss in the meanwhile.  - CBC with Differential/Platelet - Vitamin B12 - VITAMIN D 25 Hydroxy (Vit-D Deficiency, Fractures) - TSH  5. BMI 36.0-36.9,adult  6. Obesity, Current BMI 37.3 Monica Clark is currently in the action stage of change. As such, her goal is to continue with weight loss efforts. She has agreed to the Category 2 Plan.   Exercise goals: As is.   Behavioral modification strategies: increasing lean protein intake, decreasing simple carbohydrates, and no skipping meals.  Monica Clark has agreed to follow-up with our clinic in 4 weeks.  She was informed of the importance of frequent follow-up visits to maximize her success with intensive lifestyle modifications for her multiple health conditions.   Monica Clark was informed we would discuss her lab results at her next visit unless there is a critical issue that needs to be addressed sooner. Monica Clark agreed to keep her next visit at the agreed upon time to discuss these results.  Objective:   Blood pressure 121/73, pulse (!) 59, temperature 97.7 F (36.5  C), height 5\' 3"  (1.6 m), weight 205 lb (93 kg), SpO2 97%. Body mass index is 36.31 kg/m.  Lab Results  Component Value Date   CREATININE 0.82 01/24/2022   BUN 10 01/24/2022   NA 145 (H) 01/24/2022   K 3.8 01/24/2022   CL 106 01/24/2022   CO2 21 01/24/2022   Lab Results  Component Value Date   ALT 9 01/24/2022   AST 11 01/24/2022   ALKPHOS 100 01/24/2022   BILITOT 0.4 01/24/2022   Lab Results  Component Value Date   HGBA1C 7.6 (A) 08/28/2022   HGBA1C 8.1 (H) 01/24/2022   HGBA1C 6.9 (H) 06/21/2021   HGBA1C 9.0 (H) 02/08/2021   HGBA1C 11.0 (H) 06/28/2020   Lab Results  Component Value Date   INSULIN 27.9 (H) 06/28/2020   INSULIN 8.7 04/16/2018   Lab Results  Component Value Date   TSH 1.990 01/24/2022   Lab Results  Component Value Date   CHOL 221 (H) 01/24/2022   HDL 38 (L) 01/24/2022   LDLCALC 153 (H) 01/24/2022   LDLDIRECT 110.0 06/21/2021   TRIG 162 (H) 01/24/2022   CHOLHDL 6 06/21/2021   Lab Results  Component Value Date   VD25OH 21.9 (L) 01/24/2022   VD25OH 14.85 (L) 06/21/2021   VD25OH 24.7 (L) 02/08/2021   Lab Results  Component Value Date   WBC 11.6 (H) 04/14/2022   HGB 13.5 04/14/2022   HCT 40.2 04/14/2022   MCV 87.6 04/14/2022   PLT 398 04/14/2022   No results found for: "IRON", "TIBC", "FERRITIN"  Attestation Statements:   Reviewed by clinician on day of visit: allergies, medications, problem list, medical history, surgical history, family history, social history, and previous encounter notes.   I, Burt Knack, am acting as transcriptionist for Quillian Quince, MD.  I have reviewed the above documentation for accuracy and completeness, and I agree with the above. -  Quillian Quince, MD

## 2022-10-27 LAB — CBC WITH DIFFERENTIAL/PLATELET
Basophils Absolute: 0.1 10*3/uL (ref 0.0–0.2)
Basos: 1 %
EOS (ABSOLUTE): 0.2 10*3/uL (ref 0.0–0.4)
Eos: 2 %
Hematocrit: 44.4 % (ref 34.0–46.6)
Hemoglobin: 14.2 g/dL (ref 11.1–15.9)
Immature Grans (Abs): 0 10*3/uL (ref 0.0–0.1)
Immature Granulocytes: 0 %
Lymphocytes Absolute: 2.6 10*3/uL (ref 0.7–3.1)
Lymphs: 27 %
MCH: 28.9 pg (ref 26.6–33.0)
MCHC: 32 g/dL (ref 31.5–35.7)
MCV: 90 fL (ref 79–97)
Monocytes Absolute: 0.4 10*3/uL (ref 0.1–0.9)
Monocytes: 5 %
Neutrophils Absolute: 6.2 10*3/uL (ref 1.4–7.0)
Neutrophils: 65 %
Platelets: 410 10*3/uL (ref 150–450)
RBC: 4.91 x10E6/uL (ref 3.77–5.28)
RDW: 14.4 % (ref 11.7–15.4)
WBC: 9.4 10*3/uL (ref 3.4–10.8)

## 2022-10-27 LAB — LIPID PANEL WITH LDL/HDL RATIO
Cholesterol, Total: 220 mg/dL — ABNORMAL HIGH (ref 100–199)
HDL: 35 mg/dL — ABNORMAL LOW (ref >39)
LDL Chol Calc (NIH): 140 mg/dL — ABNORMAL HIGH (ref 0–99)
LDL/HDL Ratio: 4 ratio — ABNORMAL HIGH (ref 0.0–3.2)
Triglycerides: 246 mg/dL — ABNORMAL HIGH (ref 0–149)
VLDL Cholesterol Cal: 45 mg/dL — ABNORMAL HIGH (ref 5–40)

## 2022-10-27 LAB — CMP14+EGFR
ALT: 11 IU/L (ref 0–32)
AST: 11 IU/L (ref 0–40)
Albumin: 4.1 g/dL (ref 3.9–4.9)
Alkaline Phosphatase: 103 IU/L (ref 44–121)
BUN/Creatinine Ratio: 18 (ref 12–28)
BUN: 17 mg/dL (ref 8–27)
Bilirubin Total: 0.3 mg/dL (ref 0.0–1.2)
CO2: 24 mmol/L (ref 20–29)
Calcium: 9.7 mg/dL (ref 8.7–10.3)
Chloride: 101 mmol/L (ref 96–106)
Creatinine, Ser: 0.93 mg/dL (ref 0.57–1.00)
Globulin, Total: 3.5 g/dL (ref 1.5–4.5)
Glucose: 154 mg/dL — ABNORMAL HIGH (ref 70–99)
Potassium: 4 mmol/L (ref 3.5–5.2)
Sodium: 142 mmol/L (ref 134–144)
Total Protein: 7.6 g/dL (ref 6.0–8.5)
eGFR: 68 mL/min/{1.73_m2} (ref >59)

## 2022-10-27 LAB — VITAMIN B12: Vitamin B-12: 355 pg/mL (ref 232–1245)

## 2022-10-27 LAB — MICROALBUMIN / CREATININE URINE RATIO
Creatinine, Urine: 87.1 mg/dL
Microalb/Creat Ratio: 3 mg/g{creat} (ref 0–29)
Microalbumin, Urine: 3 ug/mL

## 2022-10-27 LAB — TSH: TSH: 1.73 u[IU]/mL (ref 0.450–4.500)

## 2022-10-27 LAB — VITAMIN D 25 HYDROXY (VIT D DEFICIENCY, FRACTURES): Vit D, 25-Hydroxy: 40.5 ng/mL (ref 30.0–100.0)

## 2022-11-23 ENCOUNTER — Ambulatory Visit (INDEPENDENT_AMBULATORY_CARE_PROVIDER_SITE_OTHER): Payer: BC Managed Care – PPO | Admitting: Family Medicine

## 2022-11-23 ENCOUNTER — Other Ambulatory Visit (INDEPENDENT_AMBULATORY_CARE_PROVIDER_SITE_OTHER): Payer: Self-pay | Admitting: Family Medicine

## 2022-11-23 DIAGNOSIS — E1169 Type 2 diabetes mellitus with other specified complication: Secondary | ICD-10-CM

## 2022-11-27 ENCOUNTER — Ambulatory Visit (INDEPENDENT_AMBULATORY_CARE_PROVIDER_SITE_OTHER): Payer: BC Managed Care – PPO | Admitting: Family Medicine

## 2022-11-27 ENCOUNTER — Encounter (INDEPENDENT_AMBULATORY_CARE_PROVIDER_SITE_OTHER): Payer: Self-pay | Admitting: Family Medicine

## 2022-11-27 VITALS — BP 111/75 | HR 65 | Temp 98.6°F | Ht 63.0 in | Wt 209.0 lb

## 2022-11-27 DIAGNOSIS — F3289 Other specified depressive episodes: Secondary | ICD-10-CM

## 2022-11-27 DIAGNOSIS — F5089 Other specified eating disorder: Secondary | ICD-10-CM

## 2022-11-27 DIAGNOSIS — E538 Deficiency of other specified B group vitamins: Secondary | ICD-10-CM | POA: Diagnosis not present

## 2022-11-27 DIAGNOSIS — Z794 Long term (current) use of insulin: Secondary | ICD-10-CM

## 2022-11-27 DIAGNOSIS — E1169 Type 2 diabetes mellitus with other specified complication: Secondary | ICD-10-CM

## 2022-11-27 DIAGNOSIS — E669 Obesity, unspecified: Secondary | ICD-10-CM

## 2022-11-27 DIAGNOSIS — Z7985 Long-term (current) use of injectable non-insulin antidiabetic drugs: Secondary | ICD-10-CM

## 2022-11-27 DIAGNOSIS — Z6837 Body mass index (BMI) 37.0-37.9, adult: Secondary | ICD-10-CM

## 2022-11-27 MED ORDER — TIRZEPATIDE 15 MG/0.5ML ~~LOC~~ SOAJ
15.0000 mg | SUBCUTANEOUS | 0 refills | Status: DC
Start: 2022-11-27 — End: 2023-03-20

## 2022-11-27 NOTE — Progress Notes (Unsigned)
Chief Complaint:   OBESITY Monica Clark is here to discuss her progress with her obesity treatment plan along with follow-up of her obesity related diagnoses. Monica Clark is on the Category 2 Plan and states she is following her eating plan approximately 80% of the time. Monica Clark states she is doing chair exercises for 20-30 minutes 3 times per week.  Today's visit was #: 46 Starting weight: 216 lbs Starting date: 04/16/2018 Today's weight: 209 lbs Today's date: 11/27/2022 Total lbs lost to date: 7 Total lbs lost since last in-office visit: 0  Interim History: Patient has struggled more this last month with increased cravings.  She continues to exercise and she tries to increase her protein.  Subjective:   1. Type 2 diabetes mellitus with other specified complication, with long-term current use of insulin (HCC) Patient is on Ozempic, and she is working on her diet and exercise.  Her A1c is improving, but not yet at goal.  She notes increased polyphagia.  2. B12 deficiency Patient's B12 is below 500 despite following a B12 rich diet.  She notes fatigue.  3. Emotional Eating Behavior Patient notes increased p.m. cravings and she struggles with snacking later in the evenings.  Assessment/Plan:   1. Type 2 diabetes mellitus with other specified complication, with long-term current use of insulin (HCC) Patient agreed to discontinue Ozempic, and start Mounjaro 15 mg once weekly with no refills.  - tirzepatide (MOUNJARO) 15 MG/0.5ML Pen; Inject 15 mg into the skin once a week.  Dispense: 6 mL; Refill: 0  2. B12 deficiency Patient agreed to start B12 1000 mcg daily.  3. Emotional Eating Behavior Patient was educated about the mechanism of emotional eating behavior and we discussed various strategies to help decrease emotional eating behavior.  We will continue to follow closely.  4. BMI 37.0-37.9, adult  5. Obesity,Beginning BMI 38.26 Monica Clark is currently in the action stage of change. As such,  her goal is to continue with weight loss efforts. She has agreed to the Category 2 Plan.   Exercise goals: As is.   Behavioral modification strategies: increasing lean protein intake, ways to avoid night time snacking, and emotional eating strategies.  Monica Clark has agreed to follow-up with our clinic in 3 weeks. She was informed of the importance of frequent follow-up visits to maximize her success with intensive lifestyle modifications for her multiple health conditions.   Objective:   Blood pressure 111/75, pulse 65, temperature 98.6 F (37 C), height 5\' 3"  (1.6 m), weight 209 lb (94.8 kg), SpO2 96%. Body mass index is 37.02 kg/m.  Lab Results  Component Value Date   CREATININE 0.93 10/26/2022   BUN 17 10/26/2022   NA 142 10/26/2022   K 4.0 10/26/2022   CL 101 10/26/2022   CO2 24 10/26/2022   Lab Results  Component Value Date   ALT 11 10/26/2022   AST 11 10/26/2022   ALKPHOS 103 10/26/2022   BILITOT 0.3 10/26/2022   Lab Results  Component Value Date   HGBA1C 7.6 (A) 08/28/2022   HGBA1C 8.1 (H) 01/24/2022   HGBA1C 6.9 (H) 06/21/2021   HGBA1C 9.0 (H) 02/08/2021   HGBA1C 11.0 (H) 06/28/2020   Lab Results  Component Value Date   INSULIN 27.9 (H) 06/28/2020   INSULIN 8.7 04/16/2018   Lab Results  Component Value Date   TSH 1.730 10/26/2022   Lab Results  Component Value Date   CHOL 220 (H) 10/26/2022   HDL 35 (L) 10/26/2022   LDLCALC 140 (  H) 10/26/2022   LDLDIRECT 110.0 06/21/2021   TRIG 246 (H) 10/26/2022   CHOLHDL 6 06/21/2021   Lab Results  Component Value Date   VD25OH 40.5 10/26/2022   VD25OH 21.9 (L) 01/24/2022   VD25OH 14.85 (L) 06/21/2021   Lab Results  Component Value Date   WBC 9.4 10/26/2022   HGB 14.2 10/26/2022   HCT 44.4 10/26/2022   MCV 90 10/26/2022   PLT 410 10/26/2022   No results found for: "IRON", "TIBC", "FERRITIN"  Attestation Statements:   Reviewed by clinician on day of visit: allergies, medications, problem list, medical  history, surgical history, family history, social history, and previous encounter notes.   I, Burt Knack, am acting as transcriptionist for Quillian Quince, MD.  I have reviewed the above documentation for accuracy and completeness, and I agree with the above. -  Quillian Quince, MD

## 2022-12-21 ENCOUNTER — Ambulatory Visit (INDEPENDENT_AMBULATORY_CARE_PROVIDER_SITE_OTHER): Payer: BC Managed Care – PPO | Admitting: Family Medicine

## 2023-01-11 ENCOUNTER — Encounter (INDEPENDENT_AMBULATORY_CARE_PROVIDER_SITE_OTHER): Payer: Self-pay | Admitting: Family Medicine

## 2023-01-11 ENCOUNTER — Ambulatory Visit (INDEPENDENT_AMBULATORY_CARE_PROVIDER_SITE_OTHER): Payer: BC Managed Care – PPO | Admitting: Family Medicine

## 2023-01-11 VITALS — BP 130/69 | HR 64 | Temp 98.0°F | Ht 63.0 in | Wt 211.0 lb

## 2023-01-11 DIAGNOSIS — E7849 Other hyperlipidemia: Secondary | ICD-10-CM

## 2023-01-11 DIAGNOSIS — E559 Vitamin D deficiency, unspecified: Secondary | ICD-10-CM | POA: Diagnosis not present

## 2023-01-11 DIAGNOSIS — Z7985 Long-term (current) use of injectable non-insulin antidiabetic drugs: Secondary | ICD-10-CM

## 2023-01-11 DIAGNOSIS — E785 Hyperlipidemia, unspecified: Secondary | ICD-10-CM

## 2023-01-11 DIAGNOSIS — E119 Type 2 diabetes mellitus without complications: Secondary | ICD-10-CM | POA: Diagnosis not present

## 2023-01-11 DIAGNOSIS — Z6837 Body mass index (BMI) 37.0-37.9, adult: Secondary | ICD-10-CM

## 2023-01-11 DIAGNOSIS — E669 Obesity, unspecified: Secondary | ICD-10-CM

## 2023-01-11 DIAGNOSIS — I10 Essential (primary) hypertension: Secondary | ICD-10-CM

## 2023-01-11 DIAGNOSIS — E1169 Type 2 diabetes mellitus with other specified complication: Secondary | ICD-10-CM

## 2023-01-11 DIAGNOSIS — Z7984 Long term (current) use of oral hypoglycemic drugs: Secondary | ICD-10-CM

## 2023-01-11 DIAGNOSIS — Z794 Long term (current) use of insulin: Secondary | ICD-10-CM

## 2023-01-11 MED ORDER — EMPAGLIFLOZIN 25 MG PO TABS: 25.0000 mg | ORAL_TABLET | Freq: Every day | ORAL | 0 refills | Status: DC

## 2023-01-11 MED ORDER — OZEMPIC (2 MG/DOSE) 8 MG/3ML ~~LOC~~ SOPN: 2.0000 mg | PEN_INJECTOR | SUBCUTANEOUS | 0 refills | Status: DC

## 2023-01-11 MED ORDER — VITAMIN D (ERGOCALCIFEROL) 1.25 MG (50000 UNIT) PO CAPS: 50000.0000 [IU] | ORAL_CAPSULE | ORAL | 0 refills | Status: DC

## 2023-01-11 MED ORDER — INSULIN DEGLUDEC 100 UNIT/ML ~~LOC~~ SOLN
30.0000 [IU] | Freq: Every day | SUBCUTANEOUS | 0 refills | Status: AC
Start: 2023-01-11 — End: 2023-02-10

## 2023-01-11 NOTE — Progress Notes (Signed)
.smr  Office: [phone]  /  Fax: [phone]  WEIGHT SUMMARY AND BIOMETRICS  Anthropometric Measurements Height: 5\' 3"  (1.6 m) Weight: 211 lb (95.7 kg) BMI (Calculated): 37.39 Weight at Last Visit: 209 lb Weight Lost Since Last Visit: 0 Weight Gained Since Last Visit: 2 lb Starting Weight: 216 lb Total Weight Loss (lbs): 5 lb (2.268 kg)   Body Composition  Body Fat %: 46.4 % Fat Mass (lbs): 98 lbs Muscle Mass (lbs): 107.6 lbs Total Body Water (lbs): 80.8 lbs Visceral Fat Rating : 15   Other Clinical Data Fasting: no Labs: no Today's Visit #: 70 Starting Date: 04/16/18    Chief Complaint: OBESITY   Discussed the use of AI scribe software for clinical note transcription with the patient, who gave verbal consent to proceed.  History of Present Illness   The patient, diagnosed with type two diabetes, vitamin D deficiency, and obesity, presents for a follow-up visit. She reports a weight gain of two pounds over the past month despite adherence to a category two eating plan approximately 70% of the time and engaging in 30-minute exercise sessions two to three times per week. The patient is currently on prescription vitamin D for her deficiency, Dewayne Shorter, and metformin for diabetes management, and Evaristo Bury for an unspecified condition.  The patient identifies emotional eating and meal planning as significant challenges in managing her health conditions. She expresses concern about the upcoming holiday season, particularly Thanksgiving, and the potential for overeating. She discusses strategies to manage portion control and resist the temptation of high-carb foods during the holiday meal.  The patient also mentions a lapse in her medication regimen, specifically the Ozempic, due to insurance preauthorization issues. She expresses a need to refill this medication and discuss the possibility of switching to [facility]. She also notes that [place], which she is currently  taking, is not listed in her medication chart.  The patient's cholesterol levels are a concern despite being on Crestor. She discusses the possibility of a referral to a lipid clinic for further management. The patient expresses anxiety about her cardiovascular health, noting a family history of fatal heart attacks.  The patient requests a refill of her vitamin D prescription and agrees to a follow-up appointment in January.          PHYSICAL EXAM:  Blood pressure 130/69, pulse 64, temperature 98 F (36.7 C), height 5\' 3"  (1.6 m), weight 211 lb (95.7 kg), SpO2 100%. Body mass index is 37.38 kg/m.  DIAGNOSTIC DATA REVIEWED:  BMET    Component Value Date/Time   NA 142 10/26/2022 0824   NA 141 03/03/2016 1226   K 4.0 10/26/2022 0824   K 3.5 03/03/2016 1226   CL 101 10/26/2022 0824   CO2 24 10/26/2022 0824   CO2 24 03/03/2016 1226   GLUCOSE 154 (H) 10/26/2022 0824   GLUCOSE 123 (H) 06/21/2021 1029   GLUCOSE 113 03/03/2016 1226   BUN 17 10/26/2022 0824   BUN 8.5 03/03/2016 1226   CREATININE 0.93 10/26/2022 0824   CREATININE 0.82 01/08/2020 0911   CREATININE 0.8 03/03/2016 1226   CALCIUM 9.7 10/26/2022 0824   CALCIUM 10.0 03/03/2016 1226   GFRNONAA >60 01/08/2020 0911   GFRAA >60 10/21/2018 1338   Lab Results  Component Value Date   HGBA1C 7.6 (A) 08/28/2022   HGBA1C (H) 10/24/2008    7.2 (NOTE) The ADA recommends the following therapeutic goal for glycemic control related to Hgb A1c measurement: Goal of therapy: <6.5 Hgb A1c  Reference:  American Diabetes Association: Clinical Practice Recommendations 2010, Diabetes Care, 2010, 33: (Suppl  1).   Lab Results  Component Value Date   INSULIN 27.9 (H) 06/28/2020   INSULIN 8.7 04/16/2018   Lab Results  Component Value Date   TSH 1.730 10/26/2022   CBC    Component Value Date/Time   WBC 9.4 10/26/2022 0824   WBC 11.6 (H) 04/14/2022 1638   RBC 4.91 10/26/2022 0824   RBC 4.59 04/14/2022 1638   HGB 14.2 10/26/2022  0824   HGB 13.8 03/03/2016 1226   HCT 44.4 10/26/2022 0824   HCT 41.1 03/03/2016 1226   PLT 410 10/26/2022 0824   MCV 90 10/26/2022 0824   MCV 91.7 03/03/2016 1226   MCH 28.9 10/26/2022 0824   MCH 29.4 04/14/2022 1638   MCHC 32.0 10/26/2022 0824   MCHC 33.6 04/14/2022 1638   RDW 14.4 10/26/2022 0824   RDW 14.1 03/03/2016 1226   Iron Studies No results found for: "IRON", "TIBC", "FERRITIN", "IRONPCTSAT" Lipid Panel     Component Value Date/Time   CHOL 220 (H) 10/26/2022 0824   TRIG 246 (H) 10/26/2022 0824   HDL 35 (L) 10/26/2022 0824   CHOLHDL 6 06/21/2021 1029   VLDL 46.0 (H) 06/21/2021 1029   LDLCALC 140 (H) 10/26/2022 0824   LDLDIRECT 110.0 06/21/2021 1029   Hepatic Function Panel     Component Value Date/Time   PROT 7.6 10/26/2022 0824   PROT 8.2 03/03/2016 1226   ALBUMIN 4.1 10/26/2022 0824   ALBUMIN 3.7 03/03/2016 1226   AST 11 10/26/2022 0824   AST 21 01/08/2020 0911   AST 72 (H) 03/03/2016 1226   ALT 11 10/26/2022 0824   ALT 13 01/08/2020 0911   ALT 46 03/03/2016 1226   ALKPHOS 103 10/26/2022 0824   ALKPHOS 118 03/03/2016 1226   BILITOT 0.3 10/26/2022 0824   BILITOT 0.4 01/08/2020 0911   BILITOT 0.61 03/03/2016 1226      Component Value Date/Time   TSH 1.730 10/26/2022 0824   Nutritional Lab Results  Component Value Date   VD25OH 40.5 10/26/2022   VD25OH 21.9 (L) 01/24/2022   VD25OH 14.85 (L) 06/21/2021     Assessment and Plan    Type 2 Diabetes Mellitus Type 2 diabetes mellitus managed with Dewayne Shorter, metformin, and Tresiba. Patient has not contacted insurance for [facility] preauthorization and is currently out of Ozempic. Recent weight gain of 2 pounds noted. Discussed diet management strategies for Thanksgiving, focusing on portion control and avoiding leftovers. Emphasized reassessing hunger levels and savoring food to avoid overeating. - Refill Ozempic 2 mg - Refill Tresiba 30 units daily - Refill Jardiance - Schedule follow-up  appointment in January - Schedule interim appointment with nurse practitioner for medication management  Obesity Obesity with recent weight gain of 2 pounds. Following a category two eating plan 70% of the time and exercising 30 minutes 2-3 times per week. Discussed challenges with emotional eating and planning, especially during holidays. Emphasized mindful food choices to avoid feeling deprived. - Continue category two eating plan - Continue exercise regimen - Implement Thanksgiving meal strategies discussed  Hyperlipidemia Hyperlipidemia inadequately managed with diet and Crestor. Discussed referral to lipid clinic due to suboptimal cholesterol levels despite current treatment. Explained that newer medications available at the lipid clinic could significantly improve cholesterol levels with minimal side effects. - Refer to lipid clinic for further management  Hypertension Hypertension managed with lisinopril and triamterene (Maxzide). - Continue current medications -Continue diet and weight loss  Vitamin  D Deficiency Vitamin D deficiency managed with prescription vitamin D. Patient requests a refill. - Refill prescription vitamin D  General Health Maintenance Advised to see PCP at least once or twice a year to maintain continuity of care. - Schedule regular PCP visits.         I have personally spent 40 minutes total time today in preparation, patient care, and documentation for this visit, including the following: review of clinical lab tests; review of medical tests/procedures/services.    She was informed of the importance of frequent follow up visits to maximize her success with intensive lifestyle modifications for her multiple health conditions.    Quillian Quince, MD

## 2023-02-12 ENCOUNTER — Ambulatory Visit: Payer: Self-pay | Admitting: Family Medicine

## 2023-02-12 NOTE — Telephone Encounter (Signed)
Copied from CRM [id number]. Topic: Clinical - Red Word Triage >> Feb 12, 2023  8:42 AM Steele Sizer wrote: Red Word that prompted transfer to Nurse Triage: Pt stated that her tongue is swollen due to an allergic reaction.  Chief Complaint: Right Side of her tongue was swollen on Saturday.  She  said it has gone down. It is hard to talk and hard to swallow. Patient states the swelling has gone down tremendously.  Symptoms: Pain  5-6 and hard to  eat. She is able to swallow.  Pertinent Negatives: Patient denies and difficulty breathing. Disposition: [] ED /[] Urgent Care (no appt availability in office) / [x] Appointment(In office/virtual)/ []  [facility]/ [] Home Care/ [] Refused Recommended Disposition /[] [place]/ []  Follow-up with PCP Additional Notes: Patient  refuses ER. Patient would like to be seen in the office. Utilized  protocol  for swallow difficulty since she has improved. Scheduled for 02/13/2023 Reason for Disposition  All other adults with tongue swelling  (Exception: Tongue swelling is a recurrent problem AND NO swelling at present.)  Taking an ACE Inhibitor medicine (e.g., benazepril / LOTENSIN, captopril / CAPOTEN, enalapril / VASOTEC, lisinopril / ZESTRIL)  [1] Swallowing difficulty AND [2] cause unknown  (Exception: Difficulty swallowing is a chronic symptom.)  Answer Assessment - Initial Assessment Questions 1. ONSET: "When did the swelling start?" (e.g., minutes, hours, days)    Started on Saturday. The swelling has gone down tremendous. 2. LOCATION: "What part of the tongue is swollen?"     Right Side  was swollen but it has gone back to normal -  Was not able to close her mouth 3. SEVERITY: "How swollen is it?"      Severe  - Moderate  She is able to swallow, soup and smoothie. 4. CAUSE: "What do you think is causing the tongue swelling?" (e.g., history of angioedema, allergies)     She has not idea. She look at her medicines. She did not use any new  product. 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 Started getting sick on Saturday 5. RECURRENT SYMPTOM: "Have you had tongue swelling before?" If Yes, ask: "When was the last time?" "What happened that time?"      It was the very first time, 6. OTHER SYMPTOMS: "Do you have any other symptoms?" (e.g., breathing difficulty, facial swelling)      Tongue was swollen and is much better  Answer Assessment - Initial Assessment Questions 1. DESCRIPTION: "Tell me more about this problem." "Are you  having trouble swallowing liquids, solids, or both?" "Any trouble with swallowing saliva (spit)?"     No not at this time. I did on Saturday. 2. SEVERITY: "How bad is the swallowing difficulty?"  (e.g., Scale 1-10; or mild, moderate, severe) It was sever on Saturday but able to swallow now( soft foods)   - MILD (0-3): Occasional swallowing difficulty; has trouble swallowing certain types of foods or liquids.        3. ONSET: "When did the swallowing problems begin?"      Saturday then Sunday tongue began to return to normal 4. CAUSE: "What do you think is causing the problem?"  (e.g., dry mouth, food or pill stuck in throat, mouth pain, sore throat, progression of disease process such as dementia or Parkinson's disease).      Mouth pain  5. CHRONIC or RECURRENT: "Is this a new problem for you?"  If No, ask: "How long have you had this  problem?" (e.g., days, weeks, months)       This is the first time 6. OTHER SYMPTOMS: "Do you have any other symptoms?" (e.g., chest pain, difficulty breathing, mouth sores, sore throat, swollen tongue, chest pain)     Hard to swallow now. Tongue is no longer swollen  Protocols used: Tongue Swelling-A-AH, Swallowing Difficulty-A-AH

## 2023-02-13 ENCOUNTER — Ambulatory Visit: Payer: BC Managed Care – PPO | Admitting: Family Medicine

## 2023-02-13 ENCOUNTER — Encounter: Payer: Self-pay | Admitting: Family Medicine

## 2023-02-13 VITALS — BP 118/62 | HR 63 | Temp 97.8°F | Ht 63.0 in | Wt 206.4 lb

## 2023-02-13 DIAGNOSIS — R220 Localized swelling, mass and lump, head: Secondary | ICD-10-CM

## 2023-02-13 NOTE — Patient Instructions (Signed)
Do not take any ACE inhibitors or lisinopril.   Don't change your other meds yet.    If you have any more lip or tongue swelling, then dial 911/go to the ER.   I doubt that will happen if you stay off lisinopril.    I'll update Dr. Ermalene Searing.   Take care.  Glad to see you.

## 2023-02-13 NOTE — Progress Notes (Signed)
She hasn't been able to start mounjaro yet.  No new meds otherwise.    She had trouble sleeping early Sunday AM 02/11/23.  She has R sided tongue swelling.  No known bite or trauma.  Didn't go to ER.  No lip swelling but she had difficultly with swallowing at the time.  She was prev on lisinopril.  Stopped med ~02/11/23.  Gradually better in the meantime.  Yesterday was clearly better than Sunday.  Today is better than yesterday.  No other swelling in the hands, feet, lips etc.    No sx like this prev.  Had been on lisinopril for months.    No other clear triggers.  No new foods.    She also has a cough that predates the lisinopril use.    Meds, vitals, and allergies reviewed.   ROS: Per HPI unless specifically indicated in ROS section   Nad Ncat Neck supple, no LA Rrr Ctab Abd soft, not ttp No tongue swelling.  No lip swelling.  No stridor.

## 2023-02-15 ENCOUNTER — Ambulatory Visit (INDEPENDENT_AMBULATORY_CARE_PROVIDER_SITE_OTHER): Payer: BC Managed Care – PPO | Admitting: Physician Assistant

## 2023-02-15 ENCOUNTER — Encounter (INDEPENDENT_AMBULATORY_CARE_PROVIDER_SITE_OTHER): Payer: Self-pay | Admitting: Physician Assistant

## 2023-02-15 VITALS — BP 138/82 | HR 64 | Temp 98.3°F | Ht 63.0 in | Wt 206.0 lb

## 2023-02-15 DIAGNOSIS — E1169 Type 2 diabetes mellitus with other specified complication: Secondary | ICD-10-CM

## 2023-02-15 DIAGNOSIS — E1159 Type 2 diabetes mellitus with other circulatory complications: Secondary | ICD-10-CM

## 2023-02-15 DIAGNOSIS — I152 Hypertension secondary to endocrine disorders: Secondary | ICD-10-CM | POA: Diagnosis not present

## 2023-02-15 DIAGNOSIS — Z7985 Long-term (current) use of injectable non-insulin antidiabetic drugs: Secondary | ICD-10-CM

## 2023-02-15 DIAGNOSIS — E669 Obesity, unspecified: Secondary | ICD-10-CM

## 2023-02-15 DIAGNOSIS — E785 Hyperlipidemia, unspecified: Secondary | ICD-10-CM

## 2023-02-15 DIAGNOSIS — Z794 Long term (current) use of insulin: Secondary | ICD-10-CM

## 2023-02-15 DIAGNOSIS — Z6836 Body mass index (BMI) 36.0-36.9, adult: Secondary | ICD-10-CM

## 2023-02-15 MED ORDER — ROSUVASTATIN CALCIUM 5 MG PO TABS: 5.0000 mg | ORAL_TABLET | Freq: Every day | ORAL | 0 refills | Status: DC

## 2023-02-15 MED ORDER — TRIAMTERENE-HCTZ 37.5-25 MG PO TABS: 1.0000 | ORAL_TABLET | Freq: Every morning | ORAL | 0 refills | Status: DC

## 2023-02-15 MED ORDER — OZEMPIC (2 MG/DOSE) 8 MG/3ML ~~LOC~~ SOPN: 2.0000 mg | PEN_INJECTOR | SUBCUTANEOUS | 0 refills | Status: DC

## 2023-02-15 NOTE — Progress Notes (Signed)
SUBJECTIVE:  Chief Complaint: Obesity  Interim History: She is down 5 lbs since her last visit.  Down 10 lbs overall TBW loss of 4.63%  Monica Clark is a 67 yo female with a history of type 2 diabetes, hypertension, and hypercholesterolemia, is seen in follow-up for obesity management.  She has been on Ozempic 2 mg weekly/Tresiba 30 units daily/metformin 500 mg daily/Jardiance 10 mg daily for diabetes, Crestor 5 mg daily for hypercholesterolemia, and Maxzide 37.5-25 mg daily for hypertension. She reports no adverse effects from these medications. However, she mentions some gastrointestinal symptoms, but is unsure if these are related to the Ozempic or her concurrent issues with vaginal wall prolapse and rectal issues. She has been using Metformin to manage her bowel movements which helps keep things moving on GLP-1 RA medications.   Her blood glucose levels have been around 150, and her last A1C in July was 7.6, indicating that her diabetes is not yet at goal. She is also on Tresiba, taking 30 units daily. She has been checking her glucose levels via finger sticks.  She had been experiencing some issues with her Lisinopril, specifically tongue swelling, and it has been discontinued and added to her allergy list. She has no reported issues with her Crestor.  In terms of her obesity management, she has been making efforts to control her diet, focusing on protein and vegetables, and limiting her carbohydrate intake. She has been engaging in chair yoga for physical activity. She has seen a decrease in adipose mass and an increase in muscle mass, resulting in an overall weight loss of 10 pounds.    Monica Clark is here to discuss her progress with her obesity treatment plan. She is on the Category 2 Plan and states she is following her eating plan approximately 80 % of the time. She states she is exercising chair yoga 20 minutes 2 times per week.   OBJECTIVE: Visit Diagnoses: Problem List Items  Addressed This Visit     Hypertension associated with diabetes (HCC)   Relevant Medications   triamterene-hydrochlorothiazide (MAXZIDE-25) 37.5-25 MG tablet   rosuvastatin (CRESTOR) 5 MG tablet   OZEMPIC, 2 MG/DOSE, 8 MG/3ML SOPN   Hyperlipidemia associated with type 2 diabetes mellitus (HCC)   Relevant Medications   triamterene-hydrochlorothiazide (MAXZIDE-25) 37.5-25 MG tablet   rosuvastatin (CRESTOR) 5 MG tablet   OZEMPIC, 2 MG/DOSE, 8 MG/3ML SOPN   Diabetes mellitus (HCC) - Primary   Relevant Medications   rosuvastatin (CRESTOR) 5 MG tablet   OZEMPIC, 2 MG/DOSE, 8 MG/3ML SOPN   Obesity,Beginning BMI 38.26   Relevant Medications   OZEMPIC, 2 MG/DOSE, 8 MG/3ML SOPN   Other Visit Diagnoses       BMI 36.0-36.9,adult Current BMI 36.6         Obesity Currently on Ozempic 2 mg weekly. Discussed potential switch to [facility] for better weight loss and glycemic control. Patient has lost 10 pounds (5% of body weight) and increased muscle mass. Mounjaro may offer better GI tolerance and more marked weight loss. Informed consent includes potential benefits and risks of similar GI side effects as Ozempic. - Continue Ozempic 2 mg weekly - Check with insurance for [facility] pre-authorization - Encourage continued physical activity, including chair yoga and potential aquatic programs - Provide information on [employer] program - Discuss meal planning and recommend [employer] meat products for easy meal prep  Type 2 Diabetes Mellitus Currently on Ozempic, Tresiba 30 units daily, and metformin 500 mg daily and Jardiance 10  mg daily. Recent fasting am blood glucose readings around 151 mg/dL. Last A1c in July was 7.6%, not at goal. Discussed potential benefits of switching to [facility] for better glycemic control and weight loss. Informed consent includes potential benefits and risks of similar GI side effects as Ozempic. - Continue Tresiba 30 units daily - Continue metformin  500 mg daily -Continue Jardiance 10 mg daily Refill and Continue Ozempic 2 mg weekly x 1 month.  - Check with insurance for [facility] pre-authorization - Monitor blood glucose levels regularly   Hypertension Currently on Maxzide 37.5-25 mg daily. Blood pressure today is 138/82 mmHg,near/ at goal. No side effects reported. Discussed potential benefit of Mounjaro in aiding blood pressure control through weight loss. - Refill/continue Maxzide 37.5-25 mg daily Monitor off of lisinopril which caused apparent allergic reaction/swelling of tongue - Monitor blood pressure regularly Continue to work on nutrition plan to promote weight loss and improve BP control.   Hypercholesterolemia Currently on Crestor 5 mg daily. No reported side effects such as muscle soreness. The 10-year ASCVD risk score (Arnett DK, et al., 2019) is: 28.6%   Values used to calculate the score:     Age: 36 years     Sex: Female     Is Non-Hispanic African American: Yes     Diabetic: Yes     Tobacco smoker: No     Systolic Blood Pressure: 138 mmHg     Is BP treated: Yes     HDL Cholesterol: 35 mg/dL     Total Cholesterol: 220 mg/dL - Continue/ refill Crestor 5 mg daily Continue GLP-1 RA medication to reduce CV risk.  Continue to work on nutrition plan -decreasing simple carbohydrates, increasing lean proteins, decreasing saturated fats and cholesterol , avoiding trans fats and exercise as able to promote weight loss, improve lipids and decrease cardiovascular risks.  General Health Maintenance Encouraged to continue current physical activities and explore new options for meal planning. Discussed benefits of chair yoga and aquatic programs for joint health and overall fitness. - Encourage continued physical activity, including chair yoga and potential aquatic programs - Provide information on [employer] program - Discuss meal planning and recommend [employer] meat products for easy meal  prep  Follow-up - Follow-up appointment with Dr. Dalbert Garnet on January 28 at 3:30 PM.  Vitals Temp: 98.3 F (36.8 C) BP: 138/82 Pulse Rate: 64 SpO2: 98 %   Anthropometric Measurements Height: 5\' 3"  (1.6 m) Weight: 206 lb (93.4 kg) BMI (Calculated): 36.5 Weight at Last Visit: 211lb Weight Lost Since Last Visit: 5lb Weight Gained Since Last Visit: 0lb Starting Weight: 216lb Total Weight Loss (lbs): 10 lb (4.536 kg)   Body Composition  Body Fat %: 44.9 % Fat Mass (lbs): 92.6 lbs Muscle Mass (lbs): 108 lbs Total Body Water (lbs): 72.2 lbs Visceral Fat Rating : 14   Other Clinical Data Fasting: no Labs: no Today's Visit #: 48 Starting Date: 04/16/18     ASSESSMENT AND PLAN:  Diet: Monica Clark is currently in the action stage of change. As such, her goal is to continue with weight loss efforts. She has agreed to Category 2 Plan.  Exercise: Monica Clark has been instructed to try a geriatric exercise plan and that some exercise is better than none for weight loss and overall health benefits.   Behavior Modification:  We discussed the following Behavioral Modification Strategies today: increasing lean protein intake, decreasing simple carbohydrates, increasing vegetables, increase H2O intake, increase high fiber foods, no skipping meals, meal  planning and cooking strategies, holiday eating strategies, and planning for success. We discussed various medication options to help Monica Clark with her weight loss efforts and we both agreed to continue Ozempic 2 mg weekly in addition to other medications for Type 2 diabetes management and consider switch to [facility] if insurance covers. The patient is checking into coverage for [facility].  Return in about 4 weeks (around 03/15/2023).Marland Kitchen She was informed of the importance of frequent follow up visits to maximize her success with intensive lifestyle modifications for her multiple health conditions.  Attestation Statements:   Reviewed by clinician on  day of visit: allergies, medications, problem list, medical history, surgical history, family history, social history, and previous encounter notes.   Time spent on visit including pre-visit chart review and post-visit care and charting was 36 minutes.    Monica Zhang, PA-C

## 2023-02-16 DIAGNOSIS — R220 Localized swelling, mass and lump, head: Secondary | ICD-10-CM | POA: Insufficient documentation

## 2023-02-16 NOTE — Assessment & Plan Note (Signed)
Clearly better in the meantime.  Most likely trigger would have been ACE inhibitor use.  Discussed.  I advised her not to take any ACE inhibitors or lisinopril.  No change in other medications at this point. Allergy list updated. If any more lip or tongue swelling, then dial 911/go to the ER.  I doubt she will have escalation of symptoms if she stays off lisinopril.  Discussed. I'll update Dr. Ermalene Searing.

## 2023-03-20 ENCOUNTER — Ambulatory Visit (INDEPENDENT_AMBULATORY_CARE_PROVIDER_SITE_OTHER): Payer: 59 | Admitting: Family Medicine

## 2023-03-20 ENCOUNTER — Encounter (INDEPENDENT_AMBULATORY_CARE_PROVIDER_SITE_OTHER): Payer: Self-pay | Admitting: Family Medicine

## 2023-03-20 VITALS — BP 123/71 | HR 59 | Temp 98.4°F | Ht 63.0 in | Wt 206.0 lb

## 2023-03-20 DIAGNOSIS — E559 Vitamin D deficiency, unspecified: Secondary | ICD-10-CM

## 2023-03-20 DIAGNOSIS — Z7985 Long-term (current) use of injectable non-insulin antidiabetic drugs: Secondary | ICD-10-CM

## 2023-03-20 DIAGNOSIS — E1169 Type 2 diabetes mellitus with other specified complication: Secondary | ICD-10-CM | POA: Diagnosis not present

## 2023-03-20 DIAGNOSIS — Z794 Long term (current) use of insulin: Secondary | ICD-10-CM | POA: Diagnosis not present

## 2023-03-20 DIAGNOSIS — E669 Obesity, unspecified: Secondary | ICD-10-CM

## 2023-03-20 DIAGNOSIS — I152 Hypertension secondary to endocrine disorders: Secondary | ICD-10-CM

## 2023-03-20 DIAGNOSIS — E785 Hyperlipidemia, unspecified: Secondary | ICD-10-CM

## 2023-03-20 DIAGNOSIS — Z6836 Body mass index (BMI) 36.0-36.9, adult: Secondary | ICD-10-CM

## 2023-03-20 DIAGNOSIS — E1159 Type 2 diabetes mellitus with other circulatory complications: Secondary | ICD-10-CM

## 2023-03-20 MED ORDER — VITAMIN D (ERGOCALCIFEROL) 1.25 MG (50000 UNIT) PO CAPS: 50000.0000 [IU] | ORAL_CAPSULE | ORAL | 0 refills | Status: DC

## 2023-03-20 MED ORDER — EMPAGLIFLOZIN 25 MG PO TABS: 25.0000 mg | ORAL_TABLET | Freq: Every day | ORAL | 0 refills | Status: DC

## 2023-03-20 MED ORDER — TRIAMTERENE-HCTZ 37.5-25 MG PO TABS: 1.0000 | ORAL_TABLET | Freq: Every morning | ORAL | 0 refills | Status: DC

## 2023-03-20 MED ORDER — TIRZEPATIDE 15 MG/0.5ML ~~LOC~~ SOAJ: 15.0000 mg | SUBCUTANEOUS | 0 refills | Status: DC

## 2023-03-20 MED ORDER — ROSUVASTATIN CALCIUM 5 MG PO TABS: 5.0000 mg | ORAL_TABLET | Freq: Every day | ORAL | 0 refills | Status: DC

## 2023-03-20 NOTE — Progress Notes (Signed)
.smr  Office: [phone]  /  Fax: [phone]  WEIGHT SUMMARY AND BIOMETRICS  Anthropometric Measurements Height: 5\' 3"  (1.6 m) Weight: 206 lb (93.4 kg) BMI (Calculated): 36.5 Weight at Last Visit: 206 lb Weight Lost Since Last Visit: 0 Weight Gained Since Last Visit: 0 Starting Weight: 216 lb Total Weight Loss (lbs): 10 lb (4.536 kg)   Body Composition  Body Fat %: 44.4 % Fat Mass (lbs): 91.6 lbs Muscle Mass (lbs): 109 lbs Total Body Water (lbs): 72 lbs Visceral Fat Rating : 14   Other Clinical Data Fasting: no Labs: no Today's Visit #: 48 Starting Date: 04/16/18    Chief Complaint: OBESITY   History of Present Illness   The patient presents to discuss her obesity.  Her weight has remained stable since last month. She adheres to her category two eating plan approximately 75% of the time and engages in chair yoga for 30 minutes twice a week.  She monitors her blood sugar primarily in the morning at work and occasionally throughout the day if she feels unwell. Her blood sugar readings have been in the 150s to 160s before eating. Her last A1c was 7.6, down from 8.1 in September.  She has not started [facility] as she is waiting for insurance approval. She took her last dose of Ozempic on Sunday and has no more at home. She is currently on Crestor, Maxzide, Jardiance, and vitamin D supplements.  She experiences fatigue throughout the day and interrupted sleep due to nocturia, which makes it difficult to fall back asleep. No lightheadedness or dizziness.          PHYSICAL EXAM:  Blood pressure 123/71, pulse (!) 59, temperature 98.4 F (36.9 C), height 5\' 3"  (1.6 m), weight 206 lb (93.4 kg), SpO2 98%. Body mass index is 36.49 kg/m.  DIAGNOSTIC DATA REVIEWED:  BMET    Component Value Date/Time   NA 142 10/26/2022 0824   NA 141 03/03/2016 1226   K 4.0 10/26/2022 0824   K 3.5 03/03/2016 1226   CL 101 10/26/2022 0824   CO2 24 10/26/2022 0824   CO2 24  03/03/2016 1226   GLUCOSE 154 (H) 10/26/2022 0824   GLUCOSE 123 (H) 06/21/2021 1029   GLUCOSE 113 03/03/2016 1226   BUN 17 10/26/2022 0824   BUN 8.5 03/03/2016 1226   CREATININE 0.93 10/26/2022 0824   CREATININE 0.82 01/08/2020 0911   CREATININE 0.8 03/03/2016 1226   CALCIUM 9.7 10/26/2022 0824   CALCIUM 10.0 03/03/2016 1226   GFRNONAA >60 01/08/2020 0911   GFRAA >60 10/21/2018 1338   Lab Results  Component Value Date   HGBA1C 7.6 (A) 08/28/2022   HGBA1C (H) 10/24/2008    7.2 (NOTE) The ADA recommends the following therapeutic goal for glycemic control related to Hgb A1c measurement: Goal of therapy: <6.5 Hgb A1c  Reference: American Diabetes Association: Clinical Practice Recommendations 2010, Diabetes Care, 2010, 33: (Suppl  1).   Lab Results  Component Value Date   INSULIN 27.9 (H) 06/28/2020   INSULIN 8.7 04/16/2018   Lab Results  Component Value Date   TSH 1.730 10/26/2022   CBC    Component Value Date/Time   WBC 9.4 10/26/2022 0824   WBC 11.6 (H) 04/14/2022 1638   RBC 4.91 10/26/2022 0824   RBC 4.59 04/14/2022 1638   HGB 14.2 10/26/2022 0824   HGB 13.8 03/03/2016 1226   HCT 44.4 10/26/2022 0824   HCT 41.1 03/03/2016 1226   PLT 410 10/26/2022 0824   MCV 90  10/26/2022 0824   MCV 91.7 03/03/2016 1226   MCH 28.9 10/26/2022 0824   MCH 29.4 04/14/2022 1638   MCHC 32.0 10/26/2022 0824   MCHC 33.6 04/14/2022 1638   RDW 14.4 10/26/2022 0824   RDW 14.1 03/03/2016 1226   Iron Studies No results found for: "IRON", "TIBC", "FERRITIN", "IRONPCTSAT" Lipid Panel     Component Value Date/Time   CHOL 220 (H) 10/26/2022 0824   TRIG 246 (H) 10/26/2022 0824   HDL 35 (L) 10/26/2022 0824   CHOLHDL 6 06/21/2021 1029   VLDL 46.0 (H) 06/21/2021 1029   LDLCALC 140 (H) 10/26/2022 0824   LDLDIRECT 110.0 06/21/2021 1029   Hepatic Function Panel     Component Value Date/Time   PROT 7.6 10/26/2022 0824   PROT 8.2 03/03/2016 1226   ALBUMIN 4.1 10/26/2022 0824   ALBUMIN 3.7  03/03/2016 1226   AST 11 10/26/2022 0824   AST 21 01/08/2020 0911   AST 72 (H) 03/03/2016 1226   ALT 11 10/26/2022 0824   ALT 13 01/08/2020 0911   ALT 46 03/03/2016 1226   ALKPHOS 103 10/26/2022 0824   ALKPHOS 118 03/03/2016 1226   BILITOT 0.3 10/26/2022 0824   BILITOT 0.4 01/08/2020 0911   BILITOT 0.61 03/03/2016 1226      Component Value Date/Time   TSH 1.730 10/26/2022 0824   Nutritional Lab Results  Component Value Date   VD25OH 40.5 10/26/2022   VD25OH 21.9 (L) 01/24/2022   VD25OH 14.85 (L) 06/21/2021     Assessment and Plan    Type 2 Diabetes Mellitus Fasting blood glucose levels 150-160 mg/dL. Last HbA1c in September was 7.6%, down from 8.1%. Pending insurance approval for [facility], last dose of Ozempic taken on Sunday. Mounjaro expected to be slightly more effective than Ozempic (95% vs. 100%). - Send prescription for [facility] to [facility] in [place]. - If Greggory Keen is not received by Monday, send a MyChart message for assistance. - Plan next visit as a fasting lab to monitor HbA1c and other relevant parameters. - Check blood sugars at home more regularly.  Hypertension Blood pressure well-controlled at 123/71 mmHg. No reports of lightheadedness or dizziness. Discussed risk of dehydration due to Jardiance and importance of hydration. - Continue current medications: Maxzide, Jardiance. - Encourage hydration to prevent dehydration, especially due to Jardiance.  Hyperlipidemia On Crestor. No new symptoms reported. - Refill Crestor prescription.  Obesity Weight well-managed. Following category two eating plan 75% of the time. Engaging in chair yoga twice a week. Muscle mass increased slightly, fat mass decreased slightly. Discussed importance of protein intake to prevent metabolic slowdown and improve blood sugar control. - Continue category two eating plan with emphasis on protein intake (yogurt, eggs, cheese, low-sodium jerky). - Continue chair yoga for  exercise.  Vitamin D Deficiency On vitamin D supplementation. No new symptoms reported. - Refill vitamin D prescription.  General Health Maintenance Advised to continue current diet and exercise regimen to manage hypertension, diabetes, and obesity. - Continue diet, exercise, and weight loss efforts.  Follow-up - Follow up in four weeks.          She was informed of the importance of frequent follow up visits to maximize her success with intensive lifestyle modifications for her multiple health conditions.    Quillian Quince, MD

## 2023-03-28 ENCOUNTER — Other Ambulatory Visit (INDEPENDENT_AMBULATORY_CARE_PROVIDER_SITE_OTHER): Payer: Self-pay | Admitting: Physician Assistant

## 2023-03-28 DIAGNOSIS — Z794 Long term (current) use of insulin: Secondary | ICD-10-CM

## 2023-03-30 ENCOUNTER — Ambulatory Visit: Payer: Self-pay | Admitting: Family Medicine

## 2023-03-30 NOTE — Telephone Encounter (Signed)
 Spoke with patient. She states she was given appointment with Dr. Avelina at [facility] on Tuesday, advised that she did not get scheduled correctly but we had something at 920. Patient became very upset and asked why she was not put in. I apologized to patient advised not sure what happened at time of scheduling.  I asked if she wanted me to look and see if anything was open sooner with another provider. Patient declined only wanted to see Dr. Avelina. She agreed to appointment at 9:20. Have put in schedule

## 2023-03-30 NOTE — Telephone Encounter (Signed)
 Copied from CRM [id number]. Topic: Clinical - Red Word Triage >> Mar 30, 2023  9:10 AM Leotis ORN wrote: [facility] that prompted transfer to Nurse Triage: pain in hip pain level 8,  worse when lying on it, present for a week now.  Chief Complaint: left hip pain Symptoms: pain Frequency: constant Pertinent Negatives: Patient denies fever, rash, numbness Disposition: [] ED /[] Urgent Care (no appt availability in office) / [x] Appointment(In office/virtual)/ []  [facility]/ [] Home Care/ [] Refused Recommended Disposition /[] [facility]/ []  Follow-up with PCP Additional Notes: has apt on Tuesday of next week.  Care advice given, denies questions, instructed to go to er if becomes worse.   Reason for Disposition  [1] MODERATE pain (e.g., interferes with normal activities, limping) AND [2] present > 3 days  Answer Assessment - Initial Assessment Questions 1. LOCATION and RADIATION: Where is the pain located?      Left hip pain 2. QUALITY: What does the pain feel like?  (e.g., sharp, dull, aching, burning)     sharp 3. SEVERITY: How bad is the pain? What does it keep you from doing?   (Scale 1-10; or mild, moderate, severe)   -  MILD (1-3): doesn't interfere with normal activities    -  MODERATE (4-7): interferes with normal activities (e.g., work or school) or awakens from sleep, limping    -  SEVERE (8-10): excruciating pain, unable to do any normal activities, unable to walk     8/10 4. ONSET: When did the pain start? Does it come and go, or is it there all the time?     About a week ago 5. WORK OR EXERCISE: Has there been any recent work or exercise that involved this part of the body?      denies 6. CAUSE: What do you think is causing the hip pain?      denies 7. AGGRAVATING FACTORS: What makes the hip pain worse? (e.g., walking, climbing stairs, running)     Laying on that side, walking 8. OTHER SYMPTOMS: Do you have any other symptoms? (e.g., back  pain, pain shooting down leg,  fever, rash)     Denies. Does have back pain.  Protocols used: Hip Pain-A-AH

## 2023-03-30 NOTE — Telephone Encounter (Signed)
 Left message to return call to our office.

## 2023-03-30 NOTE — Telephone Encounter (Signed)
 Noted.

## 2023-04-03 ENCOUNTER — Encounter: Payer: Self-pay | Admitting: Family Medicine

## 2023-04-03 ENCOUNTER — Ambulatory Visit: Payer: Self-pay | Admitting: Family Medicine

## 2023-04-03 VITALS — BP 118/80 | HR 62 | Temp 97.6°F | Ht 63.0 in | Wt 212.1 lb

## 2023-04-03 DIAGNOSIS — G8929 Other chronic pain: Secondary | ICD-10-CM

## 2023-04-03 DIAGNOSIS — M25511 Pain in right shoulder: Secondary | ICD-10-CM | POA: Diagnosis not present

## 2023-04-03 DIAGNOSIS — M7062 Trochanteric bursitis, left hip: Secondary | ICD-10-CM | POA: Diagnosis not present

## 2023-04-03 DIAGNOSIS — M25512 Pain in left shoulder: Secondary | ICD-10-CM | POA: Diagnosis not present

## 2023-04-03 NOTE — Assessment & Plan Note (Signed)
Chronic, acute worsening likely secondary to increase in physical activity.   Most consistent with rotator cuff tendinitis, possible posttraumatic arthritis in right shoulder given past fall.  Offered x-rays to evaluate today but she would like to hold off at this time. Offered referral to orthopedics but she would like to hold off at this time. Recommended rotator cuff strengthening exercises for both shoulders as well as NSAIDs as needed.

## 2023-04-03 NOTE — Assessment & Plan Note (Signed)
Acute, now significantly proved.  Continue diclofenac 75 g p.o. twice daily next week.  If pain not continuing to resolve she will call for possible prednisone taper versus referral to orthopedics for steroid injection. She will start doing hip bursitis stretches and exercises

## 2023-04-03 NOTE — Progress Notes (Signed)
Patient ID: Monica Clark, female    DOB: [birth year], 68 y.o.   MRN: [id number]  This visit was conducted in person.  BP 118/80 (BP Location: Left Arm, Patient Position: Sitting, Cuff Size: Large)   Pulse 62   Temp 97.6 F (36.4 C) (Temporal)   Ht 5\' 3"  (1.6 m)   Wt 212 lb 2 oz (96.2 kg)   SpO2 99%   BMI 37.58 kg/m    CC:  Chief Complaint  Patient presents with   Hip Pain    Left x 2 weeks but pain went away on Saturday night   Arm Pain    Bilateral    Subjective:   HPI: Monica Clark is a 68 y.o. female history of type 2 diabetes, endometrial cancer, hypertension and BMI 37 presenting on 04/03/2023 for Hip Pain (Left x 2 weeks but pain went away on Saturday night) and Arm Pain (Bilateral)  New onset pain in left hip, severe 8 out of 10, worse when laying on left side. Ongoing for 2 weeks, pain did improve 3 days ago.  No fall, she has been trying to exercise more in lat several month... cahir yoga, no weights.   Using diclofenac 75 mg BID  using every 3-4 days.   She also has bilateral arm pain.. in last several month... decrease ROM in bilateral shoulders.. pain with int and ext rotation,   2 years ago fell on  right arm... ever since then.. there is more discomfort.   She has had some history of right hip pain chronically. Mild to moderate osteoarthritis seen on x-ray in 2022     Upcoming OV with Dr. Dalbert Garnet for labs and weight management.... she would like to have me manage DM given no longer seeing Dr. Lucianne Muss. Lab Results  Component Value Date   HGBA1C 7.6 (A) 08/28/2022    Relevant past medical, surgical, family and social history reviewed and updated as indicated. Interim medical history since our last visit reviewed. Allergies and medications reviewed and updated. Outpatient Medications Prior to Visit  Medication Sig Dispense Refill   diclofenac (VOLTAREN) 75 MG EC tablet Take 1 tablet (75 mg total) by mouth 2 (two) times daily as needed for moderate  pain. 30 tablet 0   empagliflozin (JARDIANCE) 25 MG TABS tablet Take 1 tablet (25 mg total) by mouth daily. 90 tablet 0   glucose blood (ONETOUCH VERIO) test strip 1 each by Other route as needed for other. Use to check blood sugar 2 times a day 100 each 4   Insulin Pen Needle 31G X 5 MM MISC Use once a day 100 each 1   metFORMIN (GLUCOPHAGE-XR) 500 MG 24 hr tablet Take 500 mg by mouth daily with breakfast.     rosuvastatin (CRESTOR) 5 MG tablet Take 1 tablet (5 mg total) by mouth daily. 30 tablet 0   tirzepatide (MOUNJARO) 15 MG/0.5ML Pen Inject 15 mg into the skin once a week. 6 mL 0   triamterene-hydrochlorothiazide (MAXZIDE-25) 37.5-25 MG tablet Take 1 tablet by mouth every morning. 30 tablet 0   Vitamin D, Ergocalciferol, (DRISDOL) 1.25 MG (50000 UNIT) CAPS capsule Take 1 capsule (50,000 Units total) by mouth 2 (two) times a week. 8 capsule 0   glucose blood (ONETOUCH VERIO) test strip 1 each by Other route as needed for other. Use to check blood sugar 2 times a day 100 each 4   OZEMPIC, 2 MG/DOSE, 8 MG/3ML SOPN Inject 2 mg into the skin once  a week. 3 mL 0   No facility-administered medications prior to visit.     Per HPI unless specifically indicated in ROS section below Review of Systems  Constitutional:  Negative for fatigue and fever.  HENT:  Negative for congestion.   Eyes:  Negative for pain.  Respiratory:  Negative for cough and shortness of breath.   Cardiovascular:  Negative for chest pain, palpitations and leg swelling.  Gastrointestinal:  Negative for abdominal pain.  Genitourinary:  Negative for dysuria and vaginal bleeding.  Musculoskeletal:  Negative for back pain.  Neurological:  Negative for syncope, light-headedness and headaches.  Psychiatric/Behavioral:  Negative for dysphoric mood.    Objective:  BP 118/80 (BP Location: Left Arm, Patient Position: Sitting, Cuff Size: Large)   Pulse 62   Temp 97.6 F (36.4 C) (Temporal)   Ht 5\' 3"  (1.6 m)   Wt 212 lb 2 oz  (96.2 kg)   SpO2 99%   BMI 37.58 kg/m   Wt Readings from Last 3 Encounters:  04/03/23 212 lb 2 oz (96.2 kg)  03/20/23 206 lb (93.4 kg)  02/15/23 206 lb (93.4 kg)      Physical Exam Constitutional:      General: She is not in acute distress.    Appearance: Normal appearance. She is well-developed. She is not ill-appearing or toxic-appearing.  HENT:     Head: Normocephalic.     Right Ear: Hearing, tympanic membrane, ear canal and external ear normal. Tympanic membrane is not erythematous, retracted or bulging.     Left Ear: Hearing, tympanic membrane, ear canal and external ear normal. Tympanic membrane is not erythematous, retracted or bulging.     Nose: No mucosal edema or rhinorrhea.     Right Sinus: No maxillary sinus tenderness or frontal sinus tenderness.     Left Sinus: No maxillary sinus tenderness or frontal sinus tenderness.     Mouth/Throat:     Pharynx: Uvula midline.  Eyes:     General: Lids are normal. Lids are everted, no foreign bodies appreciated.     Conjunctiva/sclera: Conjunctivae normal.     Pupils: Pupils are equal, round, and reactive to light.  Neck:     Thyroid: No thyroid mass or thyromegaly.     Vascular: No carotid bruit.     Trachea: Trachea normal.  Cardiovascular:     Rate and Rhythm: Normal rate and regular rhythm.     Pulses: Normal pulses.     Heart sounds: Normal heart sounds, S1 normal and S2 normal. No murmur heard.    No friction rub. No gallop.  Pulmonary:     Effort: Pulmonary effort is normal. No tachypnea or respiratory distress.     Breath sounds: Normal breath sounds. No decreased breath sounds, wheezing, rhonchi or rales.  Abdominal:     General: Bowel sounds are normal.     Palpations: Abdomen is soft.     Tenderness: There is no abdominal tenderness.  Musculoskeletal:     Right shoulder: Tenderness present. No bony tenderness. Normal range of motion.     Left shoulder: Tenderness present. No bony tenderness. Normal range of  motion.     Right upper arm: Tenderness present. No swelling or bony tenderness.     Left upper arm: Tenderness present. No swelling or bony tenderness.     Right elbow: Normal.     Left elbow: Normal.     Cervical back: Normal, normal range of motion and neck supple. No tenderness. No pain with movement.  Normal range of motion.     Thoracic back: Normal.     Lumbar back: Normal. No swelling, tenderness or bony tenderness. Normal range of motion. Negative right straight leg raise test and negative left straight leg raise test.     Right hip: Normal range of motion.     Left hip: Tenderness present. No bony tenderness. Normal range of motion.     Comments: Negative Spurling's test Ttp over lateral hip  Skin:    General: Skin is warm and dry.     Findings: No rash.  Neurological:     Mental Status: She is alert.  Psychiatric:        Mood and Affect: Mood is not anxious or depressed.        Speech: Speech normal.        Behavior: Behavior normal. Behavior is cooperative.        Thought Content: Thought content normal.        Judgment: Judgment normal.       Results for orders placed or performed in visit on 10/26/22  CBC with Differential/Platelet   Collection Time: 10/26/22  8:24 AM  Result Value Ref Range   WBC 9.4 3.4 - 10.8 x10E3/uL   RBC 4.91 3.77 - 5.28 x10E6/uL   Hemoglobin 14.2 11.1 - 15.9 g/dL   Hematocrit 16.1 09.6 - 46.6 %   MCV 90 79 - 97 fL   MCH 28.9 26.6 - 33.0 pg   MCHC 32.0 31.5 - 35.7 g/dL   RDW 04.5 40.9 - 81.1 %   Platelets 410 150 - 450 x10E3/uL   Neutrophils 65 Not Estab. %   Lymphs 27 Not Estab. %   Monocytes 5 Not Estab. %   Eos 2 Not Estab. %   Basos 1 Not Estab. %   Neutrophils Absolute 6.2 1.4 - 7.0 x10E3/uL   Lymphocytes Absolute 2.6 0.7 - 3.1 x10E3/uL   Monocytes Absolute 0.4 0.1 - 0.9 x10E3/uL   EOS (ABSOLUTE) 0.2 0.0 - 0.4 x10E3/uL   Basophils Absolute 0.1 0.0 - 0.2 x10E3/uL   Immature Granulocytes 0 Not Estab. %   Immature Grans (Abs) 0.0  0.0 - 0.1 x10E3/uL  Vitamin B12   Collection Time: 10/26/22  8:24 AM  Result Value Ref Range   Vitamin B-12 355 232 - 1,245 pg/mL  CMP14+EGFR   Collection Time: 10/26/22  8:24 AM  Result Value Ref Range   Glucose 154 (H) 70 - 99 mg/dL   BUN 17 8 - 27 mg/dL   Creatinine, Ser 9.14 0.57 - 1.00 mg/dL   eGFR 68 >78 GN/FAO/1.30   BUN/Creatinine Ratio 18 12 - 28   Sodium 142 134 - 144 mmol/L   Potassium 4.0 3.5 - 5.2 mmol/L   Chloride 101 96 - 106 mmol/L   CO2 24 20 - 29 mmol/L   Calcium 9.7 8.7 - 10.3 mg/dL   Total Protein 7.6 6.0 - 8.5 g/dL   Albumin 4.1 3.9 - 4.9 g/dL   Globulin, Total 3.5 1.5 - 4.5 g/dL   Bilirubin Total 0.3 0.0 - 1.2 mg/dL   Alkaline Phosphatase 103 44 - 121 IU/L   AST 11 0 - 40 IU/L   ALT 11 0 - 32 IU/L  Lipid Panel With LDL/HDL Ratio   Collection Time: 10/26/22  8:24 AM  Result Value Ref Range   Cholesterol, Total 220 (H) 100 - 199 mg/dL   Triglycerides 865 (H) 0 - 149 mg/dL   HDL 35 (L) >78 mg/dL  VLDL Cholesterol Cal 45 (H) 5 - 40 mg/dL   LDL Chol Calc (NIH) 161 (H) 0 - 99 mg/dL   LDL/HDL Ratio 4.0 (H) 0.0 - 3.2 ratio  VITAMIN D 25 Hydroxy (Vit-D Deficiency, Fractures)   Collection Time: 10/26/22  8:24 AM  Result Value Ref Range   Vit D, 25-Hydroxy 40.5 30.0 - 100.0 ng/mL  Microalbumin / creatinine urine ratio   Collection Time: 10/26/22  8:24 AM  Result Value Ref Range   Creatinine, Urine 87.1 Not Estab. mg/dL   Microalbumin, Urine 3.0 Not Estab. ug/mL   Microalb/Creat Ratio 3 0 - 29 mg/g creat  TSH   Collection Time: 10/26/22  8:24 AM  Result Value Ref Range   TSH 1.730 0.450 - 4.500 uIU/mL    Assessment and Plan  Trochanteric bursitis of left hip Assessment & Plan: Acute, now significantly proved.  Continue diclofenac 75 g p.o. twice daily next week.  If pain not continuing to resolve she will call for possible prednisone taper versus referral to orthopedics for steroid injection. She will start doing hip bursitis stretches and  exercises   Chronic pain of both shoulders Assessment & Plan: Chronic, acute worsening likely secondary to increase in physical activity.   Most consistent with rotator cuff tendinitis, possible posttraumatic arthritis in right shoulder given past fall.  Offered x-rays to evaluate today but she would like to hold off at this time. Offered referral to orthopedics but she would like to hold off at this time. Recommended rotator cuff strengthening exercises for both shoulders as well as NSAIDs as needed.     Return in about 4 weeks (around 05/01/2023) for diabetes follow up after labs done at Dr. Francena Hanly office.   Kerby Nora, MD

## 2023-04-25 ENCOUNTER — Ambulatory Visit (INDEPENDENT_AMBULATORY_CARE_PROVIDER_SITE_OTHER): Payer: 59 | Admitting: Family Medicine

## 2023-04-30 ENCOUNTER — Encounter (INDEPENDENT_AMBULATORY_CARE_PROVIDER_SITE_OTHER): Payer: Self-pay | Admitting: Family Medicine

## 2023-04-30 ENCOUNTER — Ambulatory Visit (INDEPENDENT_AMBULATORY_CARE_PROVIDER_SITE_OTHER): Admitting: Family Medicine

## 2023-04-30 VITALS — BP 116/73 | HR 72 | Temp 98.0°F | Ht 63.0 in | Wt 201.0 lb

## 2023-04-30 DIAGNOSIS — Z7985 Long-term (current) use of injectable non-insulin antidiabetic drugs: Secondary | ICD-10-CM

## 2023-04-30 DIAGNOSIS — E669 Obesity, unspecified: Secondary | ICD-10-CM

## 2023-04-30 DIAGNOSIS — I152 Hypertension secondary to endocrine disorders: Secondary | ICD-10-CM

## 2023-04-30 DIAGNOSIS — Z7984 Long term (current) use of oral hypoglycemic drugs: Secondary | ICD-10-CM

## 2023-04-30 DIAGNOSIS — I10 Essential (primary) hypertension: Secondary | ICD-10-CM

## 2023-04-30 DIAGNOSIS — E785 Hyperlipidemia, unspecified: Secondary | ICD-10-CM

## 2023-04-30 DIAGNOSIS — E559 Vitamin D deficiency, unspecified: Secondary | ICD-10-CM | POA: Diagnosis not present

## 2023-04-30 DIAGNOSIS — E119 Type 2 diabetes mellitus without complications: Secondary | ICD-10-CM

## 2023-04-30 DIAGNOSIS — Z6835 Body mass index (BMI) 35.0-35.9, adult: Secondary | ICD-10-CM

## 2023-04-30 DIAGNOSIS — Z794 Long term (current) use of insulin: Secondary | ICD-10-CM

## 2023-04-30 DIAGNOSIS — E1169 Type 2 diabetes mellitus with other specified complication: Secondary | ICD-10-CM

## 2023-04-30 MED ORDER — ROSUVASTATIN CALCIUM 5 MG PO TABS: 5.0000 mg | ORAL_TABLET | Freq: Every day | ORAL | 0 refills | Status: DC

## 2023-04-30 MED ORDER — ONETOUCH VERIO VI STRP: 1.0000 | ORAL_STRIP | 4 refills | Status: AC | PRN

## 2023-04-30 MED ORDER — TRIAMTERENE-HCTZ 37.5-25 MG PO TABS
1.0000 | ORAL_TABLET | Freq: Every morning | ORAL | 0 refills | Status: DC
Start: 2023-04-30 — End: 2023-05-21

## 2023-04-30 NOTE — Progress Notes (Signed)
 Office: [phone]  /  Fax: [phone]  WEIGHT SUMMARY AND BIOMETRICS  Anthropometric Measurements Height: 5\' 3"  (1.6 m) Weight: 201 lb (91.2 kg) BMI (Calculated): 35.61 Weight at Last Visit: 206lb Weight Lost Since Last Visit: 5lb Weight Gained Since Last Visit: 0 Starting Weight: 216lb Total Weight Loss (lbs): 15 lb (6.804 kg)   Body Composition  Body Fat %: 44.6 % Fat Mass (lbs): 90 lbs Muscle Mass (lbs): 106 lbs Total Body Water (lbs): 73 lbs Visceral Fat Rating : 14   Other Clinical Data Fasting: no Labs: no Today's Visit #: 50 Starting Date: 04/16/18    Chief Complaint: OBESITY    History of Present Illness   The patient presents for obesity treatment and monitoring of progress.  She has lost five pounds over the past six weeks by adhering to a category two eating plan 80% of the time and participating in chair yoga for 30 minutes, three times per week. She is currently taking Mounjaro, which initially caused nausea and cramps, but these symptoms have improved. She continues to experience some nausea with subsequent doses but no cramps.  Her fasting blood sugar levels have increased to 170-180 mg/dL since running out of [place] almost two weeks ago. Previously, her fasting levels were in the 130s to 150s. She was taking approximately 30 units of Tresiba before running out. Her last hemoglobin A1c was 7.6%, down from 8.1% in July. She is currently taking Jardiance, metformin, and Mounjaro, and she has a 90-day supply of Mounjaro.  Her blood pressure is well controlled at 116/73 mmHg on Maxzide 25 mg. She continues to focus on diet and exercise to manage her blood pressure.  She is experiencing significant stress, which she describes as 'kind of off the scales.' Despite this, her hunger is reasonably well controlled.  She is considering retirement between October and December.          PHYSICAL EXAM:  Blood pressure 116/73, pulse 72, temperature 98 F  (36.7 C), height 5\' 3"  (1.6 m), weight 201 lb (91.2 kg), SpO2 96%. Body mass index is 35.61 kg/m.  DIAGNOSTIC DATA REVIEWED:  BMET    Component Value Date/Time   NA 142 10/26/2022 0824   NA 141 03/03/2016 1226   K 4.0 10/26/2022 0824   K 3.5 03/03/2016 1226   CL 101 10/26/2022 0824   CO2 24 10/26/2022 0824   CO2 24 03/03/2016 1226   GLUCOSE 154 (H) 10/26/2022 0824   GLUCOSE 123 (H) 06/21/2021 1029   GLUCOSE 113 03/03/2016 1226   BUN 17 10/26/2022 0824   BUN 8.5 03/03/2016 1226   CREATININE 0.93 10/26/2022 0824   CREATININE 0.82 01/08/2020 0911   CREATININE 0.8 03/03/2016 1226   CALCIUM 9.7 10/26/2022 0824   CALCIUM 10.0 03/03/2016 1226   GFRNONAA >60 01/08/2020 0911   GFRAA >60 10/21/2018 1338   Lab Results  Component Value Date   HGBA1C 7.6 (A) 08/28/2022   HGBA1C (H) 10/24/2008    7.2 (NOTE) The ADA recommends the following therapeutic goal for glycemic control related to Hgb A1c measurement: Goal of therapy: <6.5 Hgb A1c  Reference: American Diabetes Association: Clinical Practice Recommendations 2010, Diabetes Care, 2010, 33: (Suppl  1).   Lab Results  Component Value Date   INSULIN 27.9 (H) 06/28/2020   INSULIN 8.7 04/16/2018   Lab Results  Component Value Date   TSH 1.730 10/26/2022   CBC    Component Value Date/Time   WBC 9.4 10/26/2022 0824   WBC 11.6 (  H) 04/14/2022 1638   RBC 4.91 10/26/2022 0824   RBC 4.59 04/14/2022 1638   HGB 14.2 10/26/2022 0824   HGB 13.8 03/03/2016 1226   HCT 44.4 10/26/2022 0824   HCT 41.1 03/03/2016 1226   PLT 410 10/26/2022 0824   MCV 90 10/26/2022 0824   MCV 91.7 03/03/2016 1226   MCH 28.9 10/26/2022 0824   MCH 29.4 04/14/2022 1638   MCHC 32.0 10/26/2022 0824   MCHC 33.6 04/14/2022 1638   RDW 14.4 10/26/2022 0824   RDW 14.1 03/03/2016 1226   Iron Studies No results found for: "IRON", "TIBC", "FERRITIN", "IRONPCTSAT" Lipid Panel     Component Value Date/Time   CHOL 220 (H) 10/26/2022 0824   TRIG 246 (H)  10/26/2022 0824   HDL 35 (L) 10/26/2022 0824   CHOLHDL 6 06/21/2021 1029   VLDL 46.0 (H) 06/21/2021 1029   LDLCALC 140 (H) 10/26/2022 0824   LDLDIRECT 110.0 06/21/2021 1029   Hepatic Function Panel     Component Value Date/Time   PROT 7.6 10/26/2022 0824   PROT 8.2 03/03/2016 1226   ALBUMIN 4.1 10/26/2022 0824   ALBUMIN 3.7 03/03/2016 1226   AST 11 10/26/2022 0824   AST 21 01/08/2020 0911   AST 72 (H) 03/03/2016 1226   ALT 11 10/26/2022 0824   ALT 13 01/08/2020 0911   ALT 46 03/03/2016 1226   ALKPHOS 103 10/26/2022 0824   ALKPHOS 118 03/03/2016 1226   BILITOT 0.3 10/26/2022 0824   BILITOT 0.4 01/08/2020 0911   BILITOT 0.61 03/03/2016 1226      Component Value Date/Time   TSH 1.730 10/26/2022 0824   Nutritional Lab Results  Component Value Date   VD25OH 40.5 10/26/2022   VD25OH 21.9 (L) 01/24/2022   VD25OH 14.85 (L) 06/21/2021     Assessment and Plan    Type 2 Diabetes Mellitus Fasting blood sugars have increased to 170-180 mg/dL since running out of [place] two weeks ago. Previously, fasting blood sugars were 130-150 mg/dL on Tresiba. Last hemoglobin A1c was 7.6% in July, down from 8.1%. Currently on Jardiance, metformin, and Mounjaro. Discussed that Mounjaro primarily affects postprandial blood sugars and has minimal impact on fasting blood sugars unless on a higher dose. Explained that the liver may release glucose in response to lower postprandial levels, causing higher fasting levels. Plan to restart Tresiba at a lower dose due to concurrent Mounjaro use. - Order hemoglobin A1c and other labs - Patient to Send [place] MyChart message with previous Tresiba dose by tomorrow so we can get her back on her insulin ASAP and she can follow up with her PCP - Monitor blood sugar levels and bring log to next visit - Follow up in three weeks  Obesity Lost five pounds since last visit six weeks ago. Following category two eating plan 80% of the time and doing chair yoga for 30  minutes three times per week. Currently on Mounjaro, which initially caused nausea and cramps, but these symptoms have improved. Hunger is reasonably well controlled. Discussed that initial side effects are due to the GI tract adjusting to the GLP-1 medication, which slows down digestion. Explained that these symptoms should continue to improve over time. - Continue category two eating plan - Continue chair yoga 30 minutes three times per week - Monitor weight and symptoms - Follow up in three weeks  Hypertension Well controlled with Maxzide 25 mg. Blood pressure today is 116/73. Working on diet and exercise to help control and improve blood pressure. -  Continue Maxzide 25 mg - Monitor blood pressure regularly - Encourage continued diet and exercise modifications  Vit D deficiency Vitamin D levels need to be checked. - Order vitamin D level   Follow-up - Follow up in three weeks on March 31 at 4:40 PM - Schedule another follow-up three weeks after that - Come in for labs any time after 7:30 AM with 8 hours of fasting.       She was informed of the importance of frequent follow up visits to maximize her success with intensive lifestyle modifications for her multiple health conditions.    Quillian Quince, MD

## 2023-05-01 ENCOUNTER — Ambulatory Visit: Payer: 59 | Admitting: Family Medicine

## 2023-05-04 LAB — LIPID PANEL WITH LDL/HDL RATIO
Cholesterol, Total: 168 mg/dL (ref 100–199)
HDL: 37 mg/dL — ABNORMAL LOW (ref >39)
LDL Chol Calc (NIH): 100 mg/dL — ABNORMAL HIGH (ref 0–99)
LDL/HDL Ratio: 2.7 ratio (ref 0.0–3.2)
Triglycerides: 176 mg/dL — ABNORMAL HIGH (ref 0–149)
VLDL Cholesterol Cal: 31 mg/dL (ref 5–40)

## 2023-05-04 LAB — CMP14+EGFR
ALT: 8 IU/L (ref 0–32)
AST: 17 IU/L (ref 0–40)
Albumin: 4.4 g/dL (ref 3.9–4.9)
Alkaline Phosphatase: 103 IU/L (ref 44–121)
BUN/Creatinine Ratio: 16 (ref 12–28)
BUN: 17 mg/dL (ref 8–27)
Bilirubin Total: 0.5 mg/dL (ref 0.0–1.2)
CO2: 22 mmol/L (ref 20–29)
Calcium: 9.8 mg/dL (ref 8.7–10.3)
Chloride: 98 mmol/L (ref 96–106)
Creatinine, Ser: 1.07 mg/dL — ABNORMAL HIGH (ref 0.57–1.00)
Globulin, Total: 3.7 g/dL (ref 1.5–4.5)
Glucose: 132 mg/dL — ABNORMAL HIGH (ref 70–99)
Potassium: 3.4 mmol/L — ABNORMAL LOW (ref 3.5–5.2)
Sodium: 139 mmol/L (ref 134–144)
Total Protein: 8.1 g/dL (ref 6.0–8.5)
eGFR: 57 mL/min/{1.73_m2} — ABNORMAL LOW (ref >59)

## 2023-05-04 LAB — HEMOGLOBIN A1C
Est. average glucose Bld gHb Est-mCnc: 197 mg/dL
Hgb A1c MFr Bld: 8.5 % — ABNORMAL HIGH (ref 4.8–5.6)

## 2023-05-04 LAB — INSULIN, RANDOM: INSULIN: 19.2 u[IU]/mL (ref 2.6–24.9)

## 2023-05-04 LAB — VITAMIN B12: Vitamin B-12: 482 pg/mL (ref 232–1245)

## 2023-05-04 LAB — VITAMIN D 25 HYDROXY (VIT D DEFICIENCY, FRACTURES): Vit D, 25-Hydroxy: 57.3 ng/mL (ref 30.0–100.0)

## 2023-05-07 ENCOUNTER — Encounter (INDEPENDENT_AMBULATORY_CARE_PROVIDER_SITE_OTHER): Payer: Self-pay | Admitting: Family Medicine

## 2023-05-07 NOTE — Telephone Encounter (Signed)
 Duplicate message.

## 2023-05-15 ENCOUNTER — Other Ambulatory Visit (INDEPENDENT_AMBULATORY_CARE_PROVIDER_SITE_OTHER): Payer: Self-pay | Admitting: Family Medicine

## 2023-05-15 DIAGNOSIS — E1169 Type 2 diabetes mellitus with other specified complication: Secondary | ICD-10-CM

## 2023-05-15 MED ORDER — TRESIBA FLEXTOUCH 100 UNIT/ML ~~LOC~~ SOPN: 10.0000 [IU] | PEN_INJECTOR | Freq: Every day | SUBCUTANEOUS | 0 refills | Status: DC

## 2023-05-15 NOTE — Telephone Encounter (Signed)
 Please send in a Rx for Tresiba 20 units

## 2023-05-21 ENCOUNTER — Encounter (INDEPENDENT_AMBULATORY_CARE_PROVIDER_SITE_OTHER): Payer: Self-pay | Admitting: Family Medicine

## 2023-05-21 ENCOUNTER — Ambulatory Visit (INDEPENDENT_AMBULATORY_CARE_PROVIDER_SITE_OTHER): Admitting: Family Medicine

## 2023-05-21 VITALS — BP 112/62 | HR 90 | Temp 97.6°F | Ht 63.0 in | Wt 195.0 lb

## 2023-05-21 DIAGNOSIS — I129 Hypertensive chronic kidney disease with stage 1 through stage 4 chronic kidney disease, or unspecified chronic kidney disease: Secondary | ICD-10-CM | POA: Diagnosis not present

## 2023-05-21 DIAGNOSIS — E119 Type 2 diabetes mellitus without complications: Secondary | ICD-10-CM

## 2023-05-21 DIAGNOSIS — Z7984 Long term (current) use of oral hypoglycemic drugs: Secondary | ICD-10-CM

## 2023-05-21 DIAGNOSIS — Z6834 Body mass index (BMI) 34.0-34.9, adult: Secondary | ICD-10-CM

## 2023-05-21 DIAGNOSIS — E559 Vitamin D deficiency, unspecified: Secondary | ICD-10-CM

## 2023-05-21 DIAGNOSIS — N189 Chronic kidney disease, unspecified: Secondary | ICD-10-CM

## 2023-05-21 DIAGNOSIS — E1169 Type 2 diabetes mellitus with other specified complication: Secondary | ICD-10-CM

## 2023-05-21 DIAGNOSIS — N1831 Chronic kidney disease, stage 3a: Secondary | ICD-10-CM

## 2023-05-21 DIAGNOSIS — E669 Obesity, unspecified: Secondary | ICD-10-CM

## 2023-05-21 DIAGNOSIS — Z7985 Long-term (current) use of injectable non-insulin antidiabetic drugs: Secondary | ICD-10-CM

## 2023-05-21 DIAGNOSIS — Z794 Long term (current) use of insulin: Secondary | ICD-10-CM

## 2023-05-21 DIAGNOSIS — E785 Hyperlipidemia, unspecified: Secondary | ICD-10-CM

## 2023-05-21 DIAGNOSIS — E1159 Type 2 diabetes mellitus with other circulatory complications: Secondary | ICD-10-CM

## 2023-05-21 MED ORDER — EMPAGLIFLOZIN 25 MG PO TABS: 25.0000 mg | ORAL_TABLET | Freq: Every day | ORAL | 0 refills | Status: DC

## 2023-05-21 MED ORDER — TRIAMTERENE-HCTZ 37.5-25 MG PO TABS: 1.0000 | ORAL_TABLET | Freq: Every morning | ORAL | 0 refills | Status: DC

## 2023-05-21 MED ORDER — TRESIBA FLEXTOUCH 100 UNIT/ML ~~LOC~~ SOPN: 20.0000 [IU] | PEN_INJECTOR | Freq: Every day | SUBCUTANEOUS | 0 refills | Status: DC

## 2023-05-21 MED ORDER — METFORMIN HCL ER 500 MG PO TB24: 500.0000 mg | ORAL_TABLET | Freq: Every day | ORAL | 0 refills | Status: DC

## 2023-05-21 MED ORDER — VITAMIN D (ERGOCALCIFEROL) 1.25 MG (50000 UNIT) PO CAPS: 50000.0000 [IU] | ORAL_CAPSULE | ORAL | 0 refills | Status: DC

## 2023-05-21 NOTE — Progress Notes (Signed)
 Office: [phone]  /  Fax: [phone]  WEIGHT SUMMARY AND BIOMETRICS  Anthropometric Measurements Height: 5\' 3"  (1.6 m) Weight: 195 lb (88.5 kg) BMI (Calculated): 34.55 Weight at Last Visit: 201 lb Weight Lost Since Last Visit: 6 lb Weight Gained Since Last Visit: 0 Starting Weight: 216lb Total Weight Loss (lbs): 21 lb (9.526 kg)   Body Composition  Body Fat %: 42.3 % Fat Mass (lbs): 82.8 lbs Muscle Mass (lbs): 107 lbs Total Body Water (lbs): 72.6 lbs Visceral Fat Rating : 13   Other Clinical Data Fasting: no Labs: no Today's Visit #: 51 Starting Date: 04/16/18    Chief Complaint: OBESITY   Discussed the use of AI scribe software for clinical note transcription with the patient, who gave verbal consent to proceed.  History of Present Illness   Monica Clark is a 68 year old female with obesity and type 2 diabetes who presents for obesity treatment plan assessment and progress evaluation.  She is adhering to a category two eating plan approximately 80% of the time and engages in exercise for about 20 minutes twice weekly. She has experienced a weight loss of six pounds over the past three weeks. She finds it challenging to consume all meals on her eating plan, particularly breakfast, due to the effects of Mounjaro, but manages to eat yogurt and a boiled egg between breakfast and a mid-morning snack.  She has a history of type 2 diabetes and is currently taking metformin and Mounjaro. She recently restarted Tresiba at 10 units daily after a period without it. She primarily checks her blood glucose levels in the mornings at work, with recent readings improving from 171 mg/dL on March 20th to 324 mg/dL on March 40NU. After eating, her blood sugar was 317 mg/dL, and later in the afternoon, it was 125 mg/dL. Recent lab work indicates a slight increase in creatinine and a decrease in GFR to 57, suggesting some kidney function concerns. Her BUN is 17, and her potassium  level is slightly low at 3.4.  She has a history of hypertension and is on Maxzide, with her blood pressure controlled at 112/62 mmHg. She requests a refill for this medication.  She also has a history of vitamin D deficiency and is being treated with prescription vitamin D twice a week. She requests a refill for this medication, and her vitamin D levels are currently at goal.  Her cholesterol levels have improved, with HDL increasing and triglycerides decreasing from 246 mg/dL to 272 mg/dL. Her LDL is better than previous levels but still above the target for someone with diabetes.          PHYSICAL EXAM:  Blood pressure 112/62, pulse 90, temperature 97.6 F (36.4 C), height 5\' 3"  (1.6 m), weight 195 lb (88.5 kg), SpO2 98%. Body mass index is 34.54 kg/m.  DIAGNOSTIC DATA REVIEWED:  BMET    Component Value Date/Time   NA 139 05/03/2023 1221   NA 141 03/03/2016 1226   K 3.4 (L) 05/03/2023 1221   K 3.5 03/03/2016 1226   CL 98 05/03/2023 1221   CO2 22 05/03/2023 1221   CO2 24 03/03/2016 1226   GLUCOSE 132 (H) 05/03/2023 1221   GLUCOSE 123 (H) 06/21/2021 1029   GLUCOSE 113 03/03/2016 1226   BUN 17 05/03/2023 1221   BUN 8.5 03/03/2016 1226   CREATININE 1.07 (H) 05/03/2023 1221   CREATININE 0.82 01/08/2020 0911   CREATININE 0.8 03/03/2016 1226   CALCIUM 9.8 05/03/2023 1221  CALCIUM 10.0 03/03/2016 1226   GFRNONAA >60 01/08/2020 0911   GFRAA >60 10/21/2018 1338   Lab Results  Component Value Date   HGBA1C 8.5 (H) 05/03/2023   HGBA1C (H) 10/24/2008    7.2 (NOTE) The ADA recommends the following therapeutic goal for glycemic control related to Hgb A1c measurement: Goal of therapy: <6.5 Hgb A1c  Reference: American Diabetes Association: Clinical Practice Recommendations 2010, Diabetes Care, 2010, 33: (Suppl  1).   Lab Results  Component Value Date   INSULIN 19.2 05/03/2023   INSULIN 8.7 04/16/2018   Lab Results  Component Value Date   TSH 1.730 10/26/2022   CBC     Component Value Date/Time   WBC 9.4 10/26/2022 0824   WBC 11.6 (H) 04/14/2022 1638   RBC 4.91 10/26/2022 0824   RBC 4.59 04/14/2022 1638   HGB 14.2 10/26/2022 0824   HGB 13.8 03/03/2016 1226   HCT 44.4 10/26/2022 0824   HCT 41.1 03/03/2016 1226   PLT 410 10/26/2022 0824   MCV 90 10/26/2022 0824   MCV 91.7 03/03/2016 1226   MCH 28.9 10/26/2022 0824   MCH 29.4 04/14/2022 1638   MCHC 32.0 10/26/2022 0824   MCHC 33.6 04/14/2022 1638   RDW 14.4 10/26/2022 0824   RDW 14.1 03/03/2016 1226   Iron Studies No results found for: "IRON", "TIBC", "FERRITIN", "IRONPCTSAT" Lipid Panel     Component Value Date/Time   CHOL 168 05/03/2023 1221   TRIG 176 (H) 05/03/2023 1221   HDL 37 (L) 05/03/2023 1221   CHOLHDL 6 06/21/2021 1029   VLDL 46.0 (H) 06/21/2021 1029   LDLCALC 100 (H) 05/03/2023 1221   LDLDIRECT 110.0 06/21/2021 1029   Hepatic Function Panel     Component Value Date/Time   PROT 8.1 05/03/2023 1221   PROT 8.2 03/03/2016 1226   ALBUMIN 4.4 05/03/2023 1221   ALBUMIN 3.7 03/03/2016 1226   AST 17 05/03/2023 1221   AST 21 01/08/2020 0911   AST 72 (H) 03/03/2016 1226   ALT 8 05/03/2023 1221   ALT 13 01/08/2020 0911   ALT 46 03/03/2016 1226   ALKPHOS 103 05/03/2023 1221   ALKPHOS 118 03/03/2016 1226   BILITOT 0.5 05/03/2023 1221   BILITOT 0.4 01/08/2020 0911   BILITOT 0.61 03/03/2016 1226      Component Value Date/Time   TSH 1.730 10/26/2022 0824   Nutritional Lab Results  Component Value Date   VD25OH 57.3 05/03/2023   VD25OH 40.5 10/26/2022   VD25OH 21.9 (L) 01/24/2022     Assessment and Plan    Type 2 Diabetes Mellitus She is on metformin, Mounjaro, and Tresiba. Recently restarted Tresiba at 10 units daily after a period without it. Blood glucose levels are high, with morning readings from 149 to 175 mg/dL. Mounjaro targets postprandial glucose levels. Hemoglobin A1c is 8.5%, indicating poor control, affecting kidney function. Increasing Tresiba to 20 units  daily is planned to improve glucose control and protect kidney function. Her pancreas can still produce insulin, indicating she may not need lifelong insulin therapy if glucose control improves. - Increase Tresiba to 20 units daily for 90 days - Refill metformin, Mounjaro, and Jardiance - Monitor blood glucose levels, especially fasting levels - Recheck kidney function and A1c in 3 months  Chronic Kidney Disease (CKD) Creatinine level has increased, and GFR has decreased to 57, indicating a decline in kidney function due to poor blood glucose control. This is a new diagnosis. Tight blood sugar and blood pressure control are essential  to prevent further kidney damage. Adequate hydration is emphasized to support kidney function. - Ensure adequate hydration - Monitor kidney function in 3 months  Hypertension Blood pressure is well controlled at 112/62 mmHg. She is on Maxzide and requests a refill. Maintaining good blood pressure control is crucial to protect kidney function. - Refill Maxzide - Continue monitoring blood pressure  Obesity Following a category two eating plan about 80% of the time and engaging in exercise for 20 minutes twice a week. Lost six pounds in the last three weeks, with almost five pounds being pure fat. Struggles with energy levels for exercise, especially after work, and finds it challenging to consume all recommended foods due to the effects of Mounjaro. - Continue category two eating plan - Encourage exercise on days off, aiming for 20 minutes of activity - Consider spreading breakfast intake between breakfast and mid-morning snack - Incorporate yogurt, boiled egg, and 8 oz of 2% [employer] milk for breakfast  Hyperlipidemia Lipid profile shows improvement. HDL has increased, triglycerides have decreased to 176 mg/dL, and LDL is better but not yet at goal for a diabetic patient. Continuing current lifestyle modifications is advised, and progress will be monitored before  considering medication adjustments. - Continue current lifestyle modifications - Monitor lipid profile  Vitamin D Deficiency Vitamin D level is now at goal (57 ng/mL). She is on prescription vitamin D twice a week and requests a refill. Continuing the current regimen is advised to maintain levels. - Refill prescription vitamin D  General Health Maintenance Advised on dietary sources of potassium to address a slight drop in potassium levels. Incorporating potassium-rich foods like beans and dairy while avoiding high-sugar options like bananas is recommended. - Incorporate potassium-rich foods such as beans and dairy  Follow-up Advised to follow up in 3 to 4 weeks. Progress will be mon itored, and treatment plans adjusted as necessary. - Schedule follow-up appointment in 3 to 4 weeks - Use MyChart for any interim concerns       She was informed of the importance of frequent follow up visits to maximize her success with intensive lifestyle modifications for her multiple health conditions.   I have personally spent 40 minutes total time today in preparation, patient care, and documentation for this visit, including the following: review of clinical lab tests; review of medical tests/procedures/services.   Quillian Quince, MD

## 2023-06-06 ENCOUNTER — Other Ambulatory Visit (INDEPENDENT_AMBULATORY_CARE_PROVIDER_SITE_OTHER): Payer: Self-pay | Admitting: Family Medicine

## 2023-06-06 DIAGNOSIS — E1169 Type 2 diabetes mellitus with other specified complication: Secondary | ICD-10-CM

## 2023-06-10 ENCOUNTER — Encounter (INDEPENDENT_AMBULATORY_CARE_PROVIDER_SITE_OTHER): Payer: Self-pay | Admitting: *Deleted

## 2023-06-11 ENCOUNTER — Ambulatory Visit (INDEPENDENT_AMBULATORY_CARE_PROVIDER_SITE_OTHER): Admitting: Family Medicine

## 2023-06-18 ENCOUNTER — Ambulatory Visit (INDEPENDENT_AMBULATORY_CARE_PROVIDER_SITE_OTHER)
Admission: RE | Admit: 2023-06-18 | Discharge: 2023-06-18 | Disposition: A | Discharge status: Home / Self Care | Source: Ambulatory Visit | Attending: Nurse Practitioner

## 2023-06-18 ENCOUNTER — Ambulatory Visit: Admitting: Nurse Practitioner

## 2023-06-18 VITALS — BP 120/82 | HR 61 | Temp 97.8°F | Ht 63.0 in | Wt 204.8 lb

## 2023-06-18 DIAGNOSIS — M79641 Pain in right hand: Secondary | ICD-10-CM | POA: Diagnosis not present

## 2023-06-18 LAB — URIC ACID: Uric Acid, Serum: 5.3 mg/dL (ref 2.4–7.0)

## 2023-06-18 NOTE — Patient Instructions (Signed)
 Nice to see you today Rest the hand best you can  Elevated when possible and you can use ice to help with discomfort and swelling I will be in touch with the results once I have reviewed them

## 2023-06-18 NOTE — Assessment & Plan Note (Addendum)
 Right hand pain without known injury.  Patient continue taking diclofenac  75 mg as prescribed encouraged rest ice and elevation.  Pending x-ray and uric acid level.  Patient's A1c was elevated at last check so we will try to avoid prednisone as my top differential is gout

## 2023-06-18 NOTE — Progress Notes (Signed)
   Acute Office Visit  Subjective:     Patient ID: Monica Clark, female    DOB: April 04, 1955, 68 y.o.   MRN: [id number]  Chief Complaint  Patient presents with   Hand Pain    Pt complains of right middle finger swelling and pain that she noticed last Monday. Pt mentions not doing any physical activity that would cause pain. Pt states of no numbness or tingling but cannot bend finger. Pain level 7-8.      Patient is in today for hand pain with a history of htn, DM2, hld,   States that approx 1 week ago her right middle finger started hurting her. States that she did not injury it but work up and it was hurting her. She states that it feels like it is jammed finger. States the pain is described as and is constant. States that Saturday and today it is hurting worse and radiating into the hand. She has tried voltaren  gel and pill without relief. States that it was feeling better Sunday but worse again   Review of Systems  Constitutional:  Negative for chills and fever.  Respiratory:  Negative for shortness of breath.   Cardiovascular:  Negative for chest pain.  Musculoskeletal:  Positive for joint pain.  Neurological:  Positive for weakness. Negative for headaches.        Objective:    BP 120/82   Pulse 61   Temp 97.8 F (36.6 C) (Oral)   Ht 5\' 3"  (1.6 m)   Wt 204 lb 12.8 oz (92.9 kg)   SpO2 96%   BMI 36.28 kg/m    Physical Exam Vitals and nursing note reviewed.  Constitutional:      Appearance: Normal appearance.  Cardiovascular:     Rate and Rhythm: Normal rate and regular rhythm.     Heart sounds: Normal heart sounds.  Pulmonary:     Effort: Pulmonary effort is normal.     Breath sounds: Normal breath sounds.  Musculoskeletal:        General: Tenderness present. No signs of injury.     Comments: Swelling to the right middle MCP join and encroaching on the two joints beside it.  Swelling noted to the PIP joint Decreased range of motion and weakness in the middle  finger   Neurological:     Mental Status: She is alert.     No results found for any visits on 06/18/23.      Assessment & Plan:   Problem List Items Addressed This Visit       Other   Right hand pain - Primary   Right hand pain without known injury.  Patient continue taking diclofenac  75 mg as prescribed encouraged rest ice and elevation.  Pending x-ray and uric acid level.  Patient's A1c was elevated at last check so we will try to avoid prednisone as my top differential is gout      Relevant Orders   DG Hand Complete Right   Uric acid    No orders of the defined types were placed in this encounter.   Return if symptoms worsen or fail to improve.  Margarie Shay, NP

## 2023-06-19 ENCOUNTER — Encounter: Payer: Self-pay | Admitting: Nurse Practitioner

## 2023-06-20 ENCOUNTER — Encounter: Payer: Self-pay | Admitting: Nurse Practitioner

## 2023-06-22 ENCOUNTER — Encounter: Payer: Self-pay | Admitting: Family Medicine

## 2023-06-22 MED ORDER — COLCHICINE 0.6 MG PO TABS: ORAL_TABLET | ORAL | 0 refills | Status: DC

## 2023-06-22 NOTE — Addendum Note (Signed)
 Addended by: Dorothe Gaster on: 06/22/2023 12:50 PM   Modules accepted: Orders

## 2023-07-03 LAB — HM MAMMOGRAPHY

## 2023-07-04 ENCOUNTER — Ambulatory Visit: Payer: Self-pay | Admitting: Family Medicine

## 2023-07-04 ENCOUNTER — Encounter: Payer: Self-pay | Admitting: Family Medicine

## 2023-07-04 ENCOUNTER — Encounter (INDEPENDENT_AMBULATORY_CARE_PROVIDER_SITE_OTHER): Payer: Self-pay | Admitting: Family Medicine

## 2023-07-04 ENCOUNTER — Ambulatory Visit (INDEPENDENT_AMBULATORY_CARE_PROVIDER_SITE_OTHER): Admitting: Family Medicine

## 2023-07-04 VITALS — BP 103/70 | HR 65 | Temp 97.7°F | Ht 63.0 in | Wt 197.0 lb

## 2023-07-04 DIAGNOSIS — Z794 Long term (current) use of insulin: Secondary | ICD-10-CM

## 2023-07-04 DIAGNOSIS — G479 Sleep disorder, unspecified: Secondary | ICD-10-CM

## 2023-07-04 DIAGNOSIS — Z6834 Body mass index (BMI) 34.0-34.9, adult: Secondary | ICD-10-CM

## 2023-07-04 DIAGNOSIS — E669 Obesity, unspecified: Secondary | ICD-10-CM

## 2023-07-04 DIAGNOSIS — E1159 Type 2 diabetes mellitus with other circulatory complications: Secondary | ICD-10-CM

## 2023-07-04 DIAGNOSIS — E119 Type 2 diabetes mellitus without complications: Secondary | ICD-10-CM

## 2023-07-04 DIAGNOSIS — E559 Vitamin D deficiency, unspecified: Secondary | ICD-10-CM

## 2023-07-04 DIAGNOSIS — Z7984 Long term (current) use of oral hypoglycemic drugs: Secondary | ICD-10-CM

## 2023-07-04 DIAGNOSIS — I10 Essential (primary) hypertension: Secondary | ICD-10-CM

## 2023-07-04 DIAGNOSIS — E785 Hyperlipidemia, unspecified: Secondary | ICD-10-CM

## 2023-07-04 DIAGNOSIS — Z7985 Long-term (current) use of injectable non-insulin antidiabetic drugs: Secondary | ICD-10-CM

## 2023-07-04 MED ORDER — METFORMIN HCL ER 500 MG PO TB24: 500.0000 mg | ORAL_TABLET | Freq: Every day | ORAL | 0 refills | Status: DC

## 2023-07-04 MED ORDER — TRIAMTERENE-HCTZ 37.5-25 MG PO TABS: 1.0000 | ORAL_TABLET | Freq: Every morning | ORAL | 0 refills | Status: DC

## 2023-07-04 MED ORDER — EMPAGLIFLOZIN 25 MG PO TABS: 25.0000 mg | ORAL_TABLET | Freq: Every day | ORAL | 0 refills | Status: DC

## 2023-07-04 MED ORDER — VITAMIN D (ERGOCALCIFEROL) 1.25 MG (50000 UNIT) PO CAPS: 50000.0000 [IU] | ORAL_CAPSULE | ORAL | 0 refills | Status: DC

## 2023-07-04 MED ORDER — TIRZEPATIDE 15 MG/0.5ML ~~LOC~~ SOAJ: 15.0000 mg | SUBCUTANEOUS | 0 refills | Status: DC

## 2023-07-04 MED ORDER — ROSUVASTATIN CALCIUM 5 MG PO TABS: 5.0000 mg | ORAL_TABLET | Freq: Every day | ORAL | 0 refills | Status: DC

## 2023-07-04 MED ORDER — TRESIBA FLEXTOUCH 100 UNIT/ML ~~LOC~~ SOPN: 20.0000 [IU] | PEN_INJECTOR | Freq: Every day | SUBCUTANEOUS | 0 refills | Status: DC

## 2023-07-04 NOTE — Progress Notes (Signed)
 Office: [phone]  /  Fax: [phone]  WEIGHT SUMMARY AND BIOMETRICS  Anthropometric Measurements Height: 5\' 3"  (1.6 m) Weight: 197 lb (89.4 kg) BMI (Calculated): 34.91 Weight at Last Visit: 195 lb Weight Lost Since Last Visit: 0 Weight Gained Since Last Visit: 2 lb Starting Weight: 216 lb Total Weight Loss (lbs): 19 lb (8.618 kg) Peak Weight: 245 lb   Body Composition  Body Fat %: 45.7 % Fat Mass (lbs): 90.4 lbs Muscle Mass (lbs): 102 lbs Total Body Water  (lbs): 77.4 lbs Visceral Fat Rating : 14   Other Clinical Data Fasting: no Labs: no Today's Visit #: 52 Starting Date: 04/16/18    Chief Complaint: OBESITY   History of Present Illness Monica Clark is a 68 year old female who presents for obesity treatment plan assessment and progress evaluation.  She has been adhering to a category two eating plan approximately 75% of the time and participates in chair yoga for 20 minutes, three times a week. Despite these efforts, she has gained two pounds over the last six months. She experiences increased hunger and struggles with nighttime eating, often feeling 'snacky' without true hunger. She attempts to manage this by keeping herself occupied and avoiding eating before bed.  She has a history of type 2 diabetes and is currently on Mounjaro , Tresiba , Jardiance , and metformin . Her most recent hemoglobin A1c increased from 7.6 to 8.5 two months ago. Her blood sugar levels have been better controlled since her last visit, with readings mostly between 104 and 135, although she experienced a reading of 189, which she considered low for her.  She has a history of hypertension and is working on diet and exercise to manage her blood pressure and diabetes. Her blood pressure is well controlled at 103/70 mmHg on her current medication regimen, which includes Maxzide.  She has a history of vitamin D  deficiency and is on prescription vitamin D  50,000 IU per week. Her most recent  vitamin D  level in March was at goal at 57.3. Her B12 level was almost at goal at 482, and she is on a B12 rich diet.  She has hyperlipidemia and is on Crestor , with her LDL decreasing from 140 to 100 two months ago. She is on a low dose of 5 mg currently.  She has been experiencing swelling and pain in her right hand, initially suspected to be gout, but she did not take the prescribed colchicine . The swelling has decreased, but she still experiences some discomfort in the middle two fingers at the back of the hand.  She experiences sleep disturbances, waking easily and having difficulty falling back asleep. She tries to avoid screens before bed and listens to soothing music to help with sleep.      PHYSICAL EXAM:  Blood pressure 103/70, pulse 65, temperature 97.7 F (36.5 C), height 5\' 3"  (1.6 m), weight 197 lb (89.4 kg), SpO2 99%. Body mass index is 34.9 kg/m.  DIAGNOSTIC DATA REVIEWED:  BMET    Component Value Date/Time   NA 139 05/03/2023 1221   NA 141 03/03/2016 1226   K 3.4 (L) 05/03/2023 1221   K 3.5 03/03/2016 1226   CL 98 05/03/2023 1221   CO2 22 05/03/2023 1221   CO2 24 03/03/2016 1226   GLUCOSE 132 (H) 05/03/2023 1221   GLUCOSE 123 (H) 06/21/2021 1029   GLUCOSE 113 03/03/2016 1226   BUN 17 05/03/2023 1221   BUN 8.5 03/03/2016 1226   CREATININE 1.07 (H) 05/03/2023 1221   CREATININE  0.82 01/08/2020 0911   CREATININE 0.8 03/03/2016 1226   CALCIUM  9.8 05/03/2023 1221   CALCIUM  10.0 03/03/2016 1226   GFRNONAA >60 01/08/2020 0911   GFRAA >60 10/21/2018 1338   Lab Results  Component Value Date   HGBA1C 8.5 (H) 05/03/2023   HGBA1C (H) 10/24/2008    7.2 (NOTE) The ADA recommends the following therapeutic goal for glycemic control related to Hgb A1c measurement: Goal of therapy: <6.5 Hgb A1c  Reference: American Diabetes Association: Clinical Practice Recommendations 2010, Diabetes Care, 2010, 33: (Suppl  1).   Lab Results  Component Value Date   INSULIN  19.2  05/03/2023   INSULIN  8.7 04/16/2018   Lab Results  Component Value Date   TSH 1.730 10/26/2022   CBC    Component Value Date/Time   WBC 9.4 10/26/2022 0824   WBC 11.6 (H) 04/14/2022 1638   RBC 4.91 10/26/2022 0824   RBC 4.59 04/14/2022 1638   HGB 14.2 10/26/2022 0824   HGB 13.8 03/03/2016 1226   HCT 44.4 10/26/2022 0824   HCT 41.1 03/03/2016 1226   PLT 410 10/26/2022 0824   MCV 90 10/26/2022 0824   MCV 91.7 03/03/2016 1226   MCH 28.9 10/26/2022 0824   MCH 29.4 04/14/2022 1638   MCHC 32.0 10/26/2022 0824   MCHC 33.6 04/14/2022 1638   RDW 14.4 10/26/2022 0824   RDW 14.1 03/03/2016 1226   Iron Studies No results found for: "IRON", "TIBC", "FERRITIN", "IRONPCTSAT" Lipid Panel     Component Value Date/Time   CHOL 168 05/03/2023 1221   TRIG 176 (H) 05/03/2023 1221   HDL 37 (L) 05/03/2023 1221   CHOLHDL 6 06/21/2021 1029   VLDL 46.0 (H) 06/21/2021 1029   LDLCALC 100 (H) 05/03/2023 1221   LDLDIRECT 110.0 06/21/2021 1029   Hepatic Function Panel     Component Value Date/Time   PROT 8.1 05/03/2023 1221   PROT 8.2 03/03/2016 1226   ALBUMIN 4.4 05/03/2023 1221   ALBUMIN 3.7 03/03/2016 1226   AST 17 05/03/2023 1221   AST 21 01/08/2020 0911   AST 72 (H) 03/03/2016 1226   ALT 8 05/03/2023 1221   ALT 13 01/08/2020 0911   ALT 46 03/03/2016 1226   ALKPHOS 103 05/03/2023 1221   ALKPHOS 118 03/03/2016 1226   BILITOT 0.5 05/03/2023 1221   BILITOT 0.4 01/08/2020 0911   BILITOT 0.61 03/03/2016 1226      Component Value Date/Time   TSH 1.730 10/26/2022 0824   Nutritional Lab Results  Component Value Date   VD25OH 57.3 05/03/2023   VD25OH 40.5 10/26/2022   VD25OH 21.9 (L) 01/24/2022     Assessment and Plan Assessment & Plan Type 2 diabetes mellitus Type 2 diabetes with recent increase in hemoglobin A1c from 7.6 to 8.5. Blood glucose levels have improved with Tresiba  adjustment to 20 units daily. She is on Mounjaro , Tresiba , Jardiance , and metformin . Blood glucose  levels mostly range from 104 to 135, with occasional higher readings. Emphasized the importance of maintaining blood glucose levels under 120 to reduce complications. - Continue Tresiba  20 units daily. - Refill Tresiba , metformin , Mounjaro . - Check labs in one month.  Obesity Obesity with a slight weight gain of 2 pounds over the last six months. She is following the category two eating plan 75% of the time and engaging in chair yoga 20 minutes three times a week. Hunger has increased, and nighttime eating remains a challenge. Fluid retention may be contributing to weight gain. - Continue category two eating plan. -  Continue chair yoga 20 minutes three times a week. - Discuss strategies to manage nighttime eating, including having a protein snack before bed.  Hypertension Hypertension is well controlled with current medication regimen. Blood pressure today is 103/70 mmHg. She is working on diet and exercise to further manage blood pressure. - Continue current antihypertensive regimen. - Refill Maxzide. - Encourage continued dietary modifications and exercise.  Hyperlipidemia Hyperlipidemia with recent improvement in LDL from 140 to 100. She is on Crestor  5 mg. There is room for further improvement in LDL levels. - Continue Crestor  5 mg daily. - Refill Crestor . - Check lipid panel at next visit.  Vitamin D  deficiency Vitamin D  deficiency is currently well managed with prescription vitamin D . Recent vitamin D  level was at goal at 57.3 ng/mL. - Continue prescription vitamin D  50,000 IU weekly. - Refill vitamin D . - Check vitamin D  levels next month.  Sleep disturbances Sleep disturbances characterized by waking easily and difficulty falling back asleep. She is employing strategies such as avoiding screens and using soothing music to improve sleep quality. Discussed keeping the bedroom cooler to improve sleep quality. - Continue current sleep hygiene practices. - Consider keeping the  bedroom cooler to improve sleep quality.   She was informed of the importance of frequent follow up visits to maximize her success with intensive lifestyle modifications for her multiple health conditions.    Jasmine Mesi, MD

## 2023-08-03 ENCOUNTER — Ambulatory Visit (INDEPENDENT_AMBULATORY_CARE_PROVIDER_SITE_OTHER): Admitting: Family Medicine

## 2023-08-03 ENCOUNTER — Encounter: Payer: Self-pay | Admitting: Family Medicine

## 2023-08-03 VITALS — BP 118/80 | HR 62 | Temp 97.5°F | Ht 63.0 in | Wt 201.0 lb

## 2023-08-03 DIAGNOSIS — M79641 Pain in right hand: Secondary | ICD-10-CM | POA: Diagnosis not present

## 2023-08-03 MED ORDER — PREDNISONE 10 MG PO TABS: ORAL_TABLET | ORAL | 0 refills | Status: DC

## 2023-08-03 NOTE — Assessment & Plan Note (Signed)
 Acute, pain focal over right third MCP joint.  Possible osteoarthritis flare versus pseudogout.  Uric acid was in the normal range. She has noted decreased swelling and pain with diclofenac  75 mg p.o. twice daily but she still has decreased mobility of right hand and cannot make a fist.  She is dropping things. We will treat with prednisone taper.  I will move forward with referral to hand specialist for her to have further evaluation if not continuing to improve with prednisone.

## 2023-08-03 NOTE — Progress Notes (Signed)
 Patient ID: Monica Clark, female    DOB: 12/28/1955, 68 y.o.   MRN: [id number]  This visit was conducted in person.  BP 118/80   Pulse 62   Temp (!) 97.5 F (36.4 C) (Oral)   Ht 5' 3 (1.6 m)   Wt 201 lb (91.2 kg)   SpO2 99%   BMI 35.61 kg/m    CC:  Chief Complaint  Patient presents with   Hand Pain    Right-Seen by Margarie Shay 06/18/23-Still having issues     Subjective:   HPI: Monica Clark is a 68 y.o. female presenting on 08/03/2023 for Hand Pain (Right-Seen by Margarie Shay 06/18/23-Still having issues/) Reviewed recent office visit note with Margarie Shay, NP June 10, 2023 for right middle finger pain.  At that time it would been hurting for approximately 1 week prior. Uric acid was within the normal range Right hand x-ray showed no acute bony abnormality. Felt possible pseudogout, patient was not interested in prednisone at that time given history of diabetes. She  never used  colchicine  given interactions.    Today she reports  significant decrease in swelling and pain but still issues with gripping MCP and PIP joint stiffness. She has  diclofenac  BID  Relevant past medical, surgical, family and social history reviewed and updated as indicated. Interim medical history since our last visit reviewed. Allergies and medications reviewed and updated. Outpatient Medications Prior to Visit  Medication Sig Dispense Refill   diclofenac  (VOLTAREN ) 75 MG EC tablet Take 1 tablet (75 mg total) by mouth 2 (two) times daily as needed for moderate pain. 30 tablet 0   empagliflozin  (JARDIANCE ) 25 MG TABS tablet Take 1 tablet (25 mg total) by mouth daily. 90 tablet 0   glucose blood (ONETOUCH VERIO) test strip 1 each by Other route as needed for other. Use to check blood sugar 2 times a day 100 each 4   insulin  degludec (TRESIBA  FLEXTOUCH) 100 UNIT/ML FlexTouch Pen Inject 20 Units into the skin daily. 18 mL 0   Insulin  Pen Needle 31G X 5 MM MISC Use once a day 100 each 1    metFORMIN  (GLUCOPHAGE -XR) 500 MG 24 hr tablet Take 1 tablet (500 mg total) by mouth daily with breakfast. 30 tablet 0   rosuvastatin  (CRESTOR ) 5 MG tablet Take 1 tablet (5 mg total) by mouth daily. 30 tablet 0   tirzepatide  (MOUNJARO ) 15 MG/0.5ML Pen Inject 15 mg into the skin once a week. 6 mL 0   triamterene -hydrochlorothiazide (MAXZIDE-25) 37.5-25 MG tablet Take 1 tablet by mouth every morning. 30 tablet 0   Vitamin D , Ergocalciferol , (DRISDOL ) 1.25 MG (50000 UNIT) CAPS capsule Take 1 capsule (50,000 Units total) by mouth 2 (two) times a week. 8 capsule 0   colchicine  0.6 MG tablet Take 2 tablets (1.2mg ) then take 1 tablet (0.6mg ) an hour later. Continue 1 tablet daily until pain resolves (Patient not taking: Reported on 08/03/2023) 15 tablet 0   No facility-administered medications prior to visit.     Per HPI unless specifically indicated in ROS section below Review of Systems  Constitutional:  Negative for fatigue and fever.  HENT:  Negative for congestion.   Eyes:  Negative for pain.  Respiratory:  Negative for cough and shortness of breath.   Cardiovascular:  Negative for chest pain, palpitations and leg swelling.  Gastrointestinal:  Negative for abdominal pain.  Genitourinary:  Negative for dysuria and vaginal bleeding.  Musculoskeletal:  Negative for back pain.  Neurological:  Negative for syncope, light-headedness and headaches.  Psychiatric/Behavioral:  Negative for dysphoric mood.    Objective:  BP 118/80   Pulse 62   Temp (!) 97.5 F (36.4 C) (Oral)   Ht 5' 3 (1.6 m)   Wt 201 lb (91.2 kg)   SpO2 99%   BMI 35.61 kg/m   Wt Readings from Last 3 Encounters:  08/03/23 201 lb (91.2 kg)  07/04/23 197 lb (89.4 kg)  06/18/23 204 lb 12.8 oz (92.9 kg)      Physical Exam Constitutional:      General: She is not in acute distress.    Appearance: Normal appearance. She is well-developed. She is not ill-appearing or toxic-appearing.  HENT:     Head: Normocephalic.     Right  Ear: Hearing, tympanic membrane, ear canal and external ear normal. Tympanic membrane is not erythematous, retracted or bulging.     Left Ear: Hearing, tympanic membrane, ear canal and external ear normal. Tympanic membrane is not erythematous, retracted or bulging.     Nose: No mucosal edema or rhinorrhea.     Right Sinus: No maxillary sinus tenderness or frontal sinus tenderness.     Left Sinus: No maxillary sinus tenderness or frontal sinus tenderness.     Mouth/Throat:     Pharynx: Uvula midline.   Eyes:     General: Lids are normal. Lids are everted, no foreign bodies appreciated.     Conjunctiva/sclera: Conjunctivae normal.     Pupils: Pupils are equal, round, and reactive to light.   Neck:     Thyroid: No thyroid mass or thyromegaly.     Vascular: No carotid bruit.     Trachea: Trachea normal.   Cardiovascular:     Rate and Rhythm: Normal rate and regular rhythm.     Pulses: Normal pulses.     Heart sounds: Normal heart sounds, S1 normal and S2 normal. No murmur heard.    No friction rub. No gallop.  Pulmonary:     Effort: Pulmonary effort is normal. No tachypnea or respiratory distress.     Breath sounds: Normal breath sounds. No decreased breath sounds, wheezing, rhonchi or rales.  Abdominal:     General: Bowel sounds are normal.     Palpations: Abdomen is soft.     Tenderness: There is no abdominal tenderness.   Musculoskeletal:     Right hand: Swelling, tenderness and bony tenderness present. No deformity or lacerations. Decreased range of motion. Normal strength. Normal sensation. There is no disruption of two-point discrimination. Normal pulse.     Cervical back: Normal range of motion and neck supple.     Comments: Ttp and mild swelling, no redness in right 3rd MCP joint   Skin:    General: Skin is warm and dry.     Findings: No rash.   Neurological:     Mental Status: She is alert.   Psychiatric:        Mood and Affect: Mood is not anxious or depressed.         Speech: Speech normal.        Behavior: Behavior normal. Behavior is cooperative.        Thought Content: Thought content normal.        Judgment: Judgment normal.       Results for orders placed or performed in visit on 07/04/23  [facility] MAMMOGRAPHY   Collection Time: 07/03/23 12:30 PM  Result Value Ref Range   HM Mammogram 0-4 Bi-Rad 0-4  Bi-Rad, Self Reported Normal    Assessment and Plan  Right hand pain Assessment & Plan: Acute, pain focal over right third MCP joint.  Possible osteoarthritis flare versus pseudogout.  Uric acid was in the normal range. She has noted decreased swelling and pain with diclofenac  75 mg p.o. twice daily but she still has decreased mobility of right hand and cannot make a fist.  She is dropping things. We will treat with prednisone taper.  I will move forward with referral to hand specialist for her to have further evaluation if not continuing to improve with prednisone.  Orders: -     Ambulatory referral to Hand Surgery  Other orders -     predniSONE; 3 tabs by mouth daily x 3 days, then 2 tabs by mouth daily x 2 days then 1 tab by mouth daily x 2 days  Dispense: 15 tablet; Refill: 0    No follow-ups on file.   Herby Lolling, MD

## 2023-08-07 ENCOUNTER — Ambulatory Visit (INDEPENDENT_AMBULATORY_CARE_PROVIDER_SITE_OTHER): Admitting: Family Medicine

## 2023-08-07 ENCOUNTER — Encounter (INDEPENDENT_AMBULATORY_CARE_PROVIDER_SITE_OTHER): Payer: Self-pay | Admitting: Family Medicine

## 2023-08-07 VITALS — BP 126/75 | HR 69 | Temp 98.0°F | Ht 63.0 in | Wt 199.0 lb

## 2023-08-07 DIAGNOSIS — E669 Obesity, unspecified: Secondary | ICD-10-CM

## 2023-08-07 DIAGNOSIS — E559 Vitamin D deficiency, unspecified: Secondary | ICD-10-CM | POA: Diagnosis not present

## 2023-08-07 DIAGNOSIS — I10 Essential (primary) hypertension: Secondary | ICD-10-CM | POA: Diagnosis not present

## 2023-08-07 DIAGNOSIS — E119 Type 2 diabetes mellitus without complications: Secondary | ICD-10-CM | POA: Diagnosis not present

## 2023-08-07 DIAGNOSIS — E785 Hyperlipidemia, unspecified: Secondary | ICD-10-CM

## 2023-08-07 DIAGNOSIS — E1169 Type 2 diabetes mellitus with other specified complication: Secondary | ICD-10-CM

## 2023-08-07 DIAGNOSIS — Z794 Long term (current) use of insulin: Secondary | ICD-10-CM

## 2023-08-07 DIAGNOSIS — E1159 Type 2 diabetes mellitus with other circulatory complications: Secondary | ICD-10-CM

## 2023-08-07 DIAGNOSIS — Z6835 Body mass index (BMI) 35.0-35.9, adult: Secondary | ICD-10-CM

## 2023-08-07 DIAGNOSIS — Z7984 Long term (current) use of oral hypoglycemic drugs: Secondary | ICD-10-CM

## 2023-08-07 MED ORDER — TRIAMTERENE-HCTZ 37.5-25 MG PO TABS: 1.0000 | ORAL_TABLET | Freq: Every morning | ORAL | 0 refills | Status: DC

## 2023-08-07 MED ORDER — METFORMIN HCL ER 500 MG PO TB24
500.0000 mg | ORAL_TABLET | Freq: Every day | ORAL | 0 refills | Status: DC
Start: 2023-08-07 — End: 2023-09-18

## 2023-08-07 MED ORDER — ROSUVASTATIN CALCIUM 5 MG PO TABS
5.0000 mg | ORAL_TABLET | Freq: Every day | ORAL | 0 refills | Status: DC
Start: 2023-08-07 — End: 2023-09-18

## 2023-08-07 MED ORDER — VITAMIN D (ERGOCALCIFEROL) 1.25 MG (50000 UNIT) PO CAPS: 50000.0000 [IU] | ORAL_CAPSULE | ORAL | 0 refills | Status: DC

## 2023-08-07 NOTE — Progress Notes (Unsigned)
 Office: [phone]  /  Fax: [phone]  WEIGHT SUMMARY AND BIOMETRICS  Anthropometric Measurements Height: 5' 3 (1.6 m) Weight: 199 lb (90.3 kg) BMI (Calculated): 35.26 Weight at Last Visit: 197 lb Weight Lost Since Last Visit: 0 Weight Gained Since Last Visit: 2 lb Starting Weight: 216 lb Total Weight Loss (lbs): 17 lb (7.711 kg) Peak Weight: 245 lb   Body Composition  Body Fat %: 43.6 % Fat Mass (lbs): 86.8 lbs Muscle Mass (lbs): 106.8 lbs Total Body Water  (lbs): 78.4 lbs Visceral Fat Rating : 13   Other Clinical Data Fasting: no Labs: no Today's Visit #: 55 Starting Date: 04/16/18    Chief Complaint: OBESITY    History of Present Illness Monica Clark is a 68 year old female with obesity and hyperlipidemia who presents for a follow-up on her treatment plan.  She follows the category two eating plan about eighty percent of the time and engages in chair yoga three times a week. Despite these efforts, she has gained two pounds in the last month. She struggles with hunger, particularly at night, and sometimes skips or inadequately eats lunch due to her busy work schedule. She has tried a pudding recipe provided previously, which she found helpful in managing evening hunger.  In addition to obesity, she is managing hyperlipidemia through diet, exercise, and weight loss. She is currently on Crestor  and requests a refill. She is on Maxzide 25 mg, for which she also requests a refill. She continues to work on weight loss to help control her blood pressure.  She has a history of type 2 diabetes and is on metformin  and Jardiance , requesting a refill for metformin . She continues to work on diet, exercise, and weight loss as part of her diabetes management plan.  She is on a vitamin D  prescription for vitamin D  deficiency and requests a refill. She reports being stable without any side effects from her current medications.  She mentions a recent issue with her finger,  suspected to be pseudogout, which affects her ability to perform daily activities like opening jars. She is scheduled to see a rheumatologist for further evaluation.  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 She plans to travel to [place]  in August. She reports feeling fluid retention today, which she believes is reflected on the scale. No new or worsening symptoms related to her current conditions.      PHYSICAL EXAM:  Blood pressure 126/75, pulse 69, temperature 98 F (36.7 C), height 5' 3 (1.6 m), weight 199 lb (90.3 kg), SpO2 99%. Body mass index is 35.25 kg/m.  DIAGNOSTIC DATA REVIEWED:  BMET    Component Value Date/Time   NA 139 05/03/2023 1221   NA 141 03/03/2016 1226   K 3.4 (L) 05/03/2023 1221   K 3.5 03/03/2016 1226   CL 98 05/03/2023 1221   CO2 22 05/03/2023 1221   CO2 24 03/03/2016 1226   GLUCOSE 132 (H) 05/03/2023 1221   GLUCOSE 123 (H) 06/21/2021 1029   GLUCOSE 113 03/03/2016 1226   BUN 17 05/03/2023 1221   BUN 8.5 03/03/2016 1226   CREATININE 1.07 (H) 05/03/2023 1221   CREATININE 0.82 01/08/2020 0911   CREATININE 0.8 03/03/2016 1226   CALCIUM  9.8 05/03/2023 1221   CALCIUM  10.0 03/03/2016 1226   GFRNONAA >60 01/08/2020 0911   GFRAA >60 10/21/2018 1338   Lab Results  Component Value Date   HGBA1C 8.5 (H) 05/03/2023   HGBA1C (H)  10/24/2008    7.2 (NOTE) The ADA recommends the following therapeutic goal for glycemic control related to Hgb A1c measurement: Goal of therapy: <6.5 Hgb A1c  Reference: American Diabetes Association: Clinical Practice Recommendations 2010, Diabetes Care, 2010, 33: (Suppl  1).   Lab Results  Component Value Date   INSULIN  19.2 05/03/2023   INSULIN  8.7 04/16/2018   Lab Results  Component Value Date   TSH 1.730 10/26/2022   CBC    Component Value Date/Time   WBC 9.4 10/26/2022 0824   WBC 11.6 (H) 04/14/2022 1638   RBC 4.91 10/26/2022 0824   RBC 4.59 04/14/2022 1638   HGB  14.2 10/26/2022 0824   HGB 13.8 03/03/2016 1226   HCT 44.4 10/26/2022 0824   HCT 41.1 03/03/2016 1226   PLT 410 10/26/2022 0824   MCV 90 10/26/2022 0824   MCV 91.7 03/03/2016 1226   MCH 28.9 10/26/2022 0824   MCH 29.4 04/14/2022 1638   MCHC 32.0 10/26/2022 0824   MCHC 33.6 04/14/2022 1638   RDW 14.4 10/26/2022 0824   RDW 14.1 03/03/2016 1226   Iron Studies No results found for: IRON, TIBC, FERRITIN, IRONPCTSAT Lipid Panel     Component Value Date/Time   CHOL 168 05/03/2023 1221   TRIG 176 (H) 05/03/2023 1221   HDL 37 (L) 05/03/2023 1221   CHOLHDL 6 06/21/2021 1029   VLDL 46.0 (H) 06/21/2021 1029   LDLCALC 100 (H) 05/03/2023 1221   LDLDIRECT 110.0 06/21/2021 1029   Hepatic Function Panel     Component Value Date/Time   PROT 8.1 05/03/2023 1221   PROT 8.2 03/03/2016 1226   ALBUMIN 4.4 05/03/2023 1221   ALBUMIN 3.7 03/03/2016 1226   AST 17 05/03/2023 1221   AST 21 01/08/2020 0911   AST 72 (H) 03/03/2016 1226   ALT 8 05/03/2023 1221   ALT 13 01/08/2020 0911   ALT 46 03/03/2016 1226   ALKPHOS 103 05/03/2023 1221   ALKPHOS 118 03/03/2016 1226   BILITOT 0.5 05/03/2023 1221   BILITOT 0.4 01/08/2020 0911   BILITOT 0.61 03/03/2016 1226      Component Value Date/Time   TSH 1.730 10/26/2022 0824   Nutritional Lab Results  Component Value Date   VD25OH 57.3 05/03/2023   VD25OH 40.5 10/26/2022   VD25OH 21.9 (L) 01/24/2022     Assessment and Plan Assessment & Plan Type 2 Diabetes Mellitus She is on metformin  and Jardiance  and requests a metformin  refill. She continues to focus on diet, exercise, and weight loss as part of her treatment plan. - Refill metformin  - Continue diet, exercise, and weight loss efforts  Obesity She follows the category two eating plan 80% of the time and engages in chair yoga three times a week. Despite these efforts, she has gained two pounds in the last month. She struggles with hunger, particularly at night, and sometimes skips  or inadequately eats lunch due to a busy schedule. Strategies for managing hunger and stress-related eating were discussed, including recognizing stressors and using healthier food options for comfort. Emphasis was placed on maintaining muscle mass and focusing on becoming leaner rather than lighter. - Continue category two eating plan - Continue chair yoga three times a week - Encourage finding enjoyable and satisfying meals - Implement strategies for managing hunger and stress-related eating - Focus on maintaining muscle mass and becoming leaner  Hyperlipidemia She is managing her hyperlipidemia with diet, exercise, and weight loss, and is currently on Crestor . She requests a refill. Emphasis on continuing  lifestyle modifications alongside medication was made. - Refill Crestor  - Continue diet, exercise, and weight loss efforts  Hypertension Blood pressure is controlled at 126/75 mmHg. She is on Maxzide 25 mg and requests a refill. She is also working on weight loss to help control her blood pressure. - Refill Maxzide - Continue weight loss efforts  Vitamin D  Deficiency She is on her current vitamin D  prescription without any side effects and requests a refill. - Refill vitamin D  prescription  Follow-up She needs to schedule her next appointment. It is suggested to schedule two appointments, one in four weeks and another further out, with the possibility of moving the July 2nd appointment if needed. - Schedule follow-up appointment in four weeks - Consider rescheduling July 2nd appointment if necessary - Call for cancellations if needed for earlier appointments     She was informed of the importance of frequent follow up visits to maximize her success with intensive lifestyle modifications for her multiple health conditions.    Jasmine Mesi, MD

## 2023-08-17 ENCOUNTER — Ambulatory Visit: Admitting: Physician Assistant

## 2023-08-22 ENCOUNTER — Ambulatory Visit (INDEPENDENT_AMBULATORY_CARE_PROVIDER_SITE_OTHER): Admitting: Family Medicine

## 2023-09-18 ENCOUNTER — Ambulatory Visit (INDEPENDENT_AMBULATORY_CARE_PROVIDER_SITE_OTHER): Admitting: Family Medicine

## 2023-09-18 ENCOUNTER — Encounter (INDEPENDENT_AMBULATORY_CARE_PROVIDER_SITE_OTHER): Payer: Self-pay | Admitting: Family Medicine

## 2023-09-18 VITALS — BP 126/75 | HR 70 | Temp 97.8°F | Ht 63.0 in | Wt 197.0 lb

## 2023-09-18 DIAGNOSIS — E669 Obesity, unspecified: Secondary | ICD-10-CM

## 2023-09-18 DIAGNOSIS — E119 Type 2 diabetes mellitus without complications: Secondary | ICD-10-CM | POA: Diagnosis not present

## 2023-09-18 DIAGNOSIS — I10 Essential (primary) hypertension: Secondary | ICD-10-CM | POA: Diagnosis not present

## 2023-09-18 DIAGNOSIS — E559 Vitamin D deficiency, unspecified: Secondary | ICD-10-CM | POA: Diagnosis not present

## 2023-09-18 DIAGNOSIS — Z794 Long term (current) use of insulin: Secondary | ICD-10-CM

## 2023-09-18 DIAGNOSIS — Z6834 Body mass index (BMI) 34.0-34.9, adult: Secondary | ICD-10-CM

## 2023-09-18 DIAGNOSIS — E785 Hyperlipidemia, unspecified: Secondary | ICD-10-CM

## 2023-09-18 DIAGNOSIS — E1169 Type 2 diabetes mellitus with other specified complication: Secondary | ICD-10-CM

## 2023-09-18 DIAGNOSIS — E66812 Obesity, class 2: Secondary | ICD-10-CM

## 2023-09-18 DIAGNOSIS — Z7984 Long term (current) use of oral hypoglycemic drugs: Secondary | ICD-10-CM

## 2023-09-18 DIAGNOSIS — I152 Hypertension secondary to endocrine disorders: Secondary | ICD-10-CM

## 2023-09-18 DIAGNOSIS — Z7985 Long-term (current) use of injectable non-insulin antidiabetic drugs: Secondary | ICD-10-CM

## 2023-09-18 MED ORDER — ROSUVASTATIN CALCIUM 5 MG PO TABS
5.0000 mg | ORAL_TABLET | Freq: Every day | ORAL | 0 refills | Status: DC
Start: 2023-09-18 — End: 2023-12-17

## 2023-09-18 MED ORDER — TRESIBA FLEXTOUCH 100 UNIT/ML ~~LOC~~ SOPN: 20.0000 [IU] | PEN_INJECTOR | Freq: Every day | SUBCUTANEOUS | 0 refills | Status: DC

## 2023-09-18 MED ORDER — TRIAMTERENE-HCTZ 37.5-25 MG PO TABS: 1.0000 | ORAL_TABLET | Freq: Every morning | ORAL | 0 refills | Status: DC

## 2023-09-18 MED ORDER — METFORMIN HCL ER 500 MG PO TB24: 500.0000 mg | ORAL_TABLET | Freq: Every day | ORAL | 0 refills | Status: DC

## 2023-09-18 MED ORDER — TIRZEPATIDE 15 MG/0.5ML ~~LOC~~ SOAJ: 15.0000 mg | SUBCUTANEOUS | 0 refills | Status: DC

## 2023-09-18 MED ORDER — EMPAGLIFLOZIN 25 MG PO TABS: 25.0000 mg | ORAL_TABLET | Freq: Every day | ORAL | 0 refills | Status: DC

## 2023-09-18 MED ORDER — VITAMIN D (ERGOCALCIFEROL) 1.25 MG (50000 UNIT) PO CAPS: 50000.0000 [IU] | ORAL_CAPSULE | ORAL | 0 refills | Status: DC

## 2023-09-18 NOTE — Progress Notes (Signed)
 Office: [phone]  /  Fax: [phone]  WEIGHT SUMMARY AND BIOMETRICS  Anthropometric Measurements Height: 5' 3 (1.6 m) Weight: 197 lb (89.4 kg) BMI (Calculated): 34.91 Weight at Last Visit: 199 lb Weight Lost Since Last Visit: 2 lb Weight Gained Since Last Visit: 0 Starting Weight: 216 lb Total Weight Loss (lbs): 19 lb (8.618 kg) Peak Weight: 245 lb   Body Composition  Body Fat %: 41.5 % Fat Mass (lbs): 82 lbs Muscle Mass (lbs): 109.8 lbs Total Body Water  (lbs): 76.6 lbs Visceral Fat Rating : 13   Other Clinical Data Fasting: no Labs: no Today's Visit #: 54 Starting Date: 04/16/18    Chief Complaint: OBESITY    History of Present Illness Monica Clark is a 68 year old female with obesity, type 2 diabetes, hyperlipidemia, and hypertension who presents for a follow-up on her obesity treatment plan and progress.  She is adhering to the category two eating plan approximately seventy percent of the time and participates in chair yoga for thirty minutes twice a week. Over the past five weeks, she has achieved a weight loss of two pounds. She faces challenges with cravings and the urge to consume more than recommended.  In managing her type 2 diabetes, she is focusing on diet, exercise, and weight loss. Her current medications include Jardiance  25 mg, Tresiba  20 units per day, metformin  XR 500 mg, and Mounjaro  15 mg. She requests refills for these medications. Her most recent hemoglobin A1c was 8.5% in March, and she is due for another check. Her blood sugar levels have been fluctuating, with fasting levels ranging from 121 to 165 mg/dL over the past three weeks.  She is addressing hyperlipidemia through lifestyle modifications, including reducing dietary cholesterol. She is on Crestor  5 mg and requests a refill. She has a history of hypertension associated with type 2 diabetes and is taking Maxzide.  She has a history of vitamin D  deficiency, currently treated with  prescription vitamin D  50,000 IU per day. Her last vitamin D  level was 57.3 ng/mL in March, and she is due for a recheck. She requests a refill of her vitamin D  prescription.  She works as an Psychiatric nurse, which involves a lot of sitting. She tries to stand when her watch reminds her and is exploring exercises she can do at work.      PHYSICAL EXAM:  Blood pressure 126/75, pulse 70, temperature 97.8 F (36.6 C), height 5' 3 (1.6 m), weight 197 lb (89.4 kg), SpO2 100%. Body mass index is 34.9 kg/m.  DIAGNOSTIC DATA REVIEWED:  BMET    Component Value Date/Time   NA 139 05/03/2023 1221   NA 141 03/03/2016 1226   K 3.4 (L) 05/03/2023 1221   K 3.5 03/03/2016 1226   CL 98 05/03/2023 1221   CO2 22 05/03/2023 1221   CO2 24 03/03/2016 1226   GLUCOSE 132 (H) 05/03/2023 1221   GLUCOSE 123 (H) 06/21/2021 1029   GLUCOSE 113 03/03/2016 1226   BUN 17 05/03/2023 1221   BUN 8.5 03/03/2016 1226   CREATININE 1.07 (H) 05/03/2023 1221   CREATININE 0.82 01/08/2020 0911   CREATININE 0.8 03/03/2016 1226   CALCIUM  9.8 05/03/2023 1221   CALCIUM  10.0 03/03/2016 1226   GFRNONAA >60 01/08/2020 0911   GFRAA >60 10/21/2018 1338   Lab Results  Component Value Date   HGBA1C 8.5 (H) 05/03/2023   HGBA1C (H) 10/24/2008    7.2 (NOTE) The ADA recommends the following therapeutic goal for glycemic control  related to Hgb A1c measurement: Goal of therapy: <6.5 Hgb A1c  Reference: American Diabetes Association: Clinical Practice Recommendations 2010, Diabetes Care, 2010, 33: (Suppl  1).   Lab Results  Component Value Date   INSULIN  19.2 05/03/2023   INSULIN  8.7 04/16/2018   Lab Results  Component Value Date   TSH 1.730 10/26/2022   CBC    Component Value Date/Time   WBC 9.4 10/26/2022 0824   WBC 11.6 (H) 04/14/2022 1638   RBC 4.91 10/26/2022 0824   RBC 4.59 04/14/2022 1638   HGB 14.2 10/26/2022 0824   HGB 13.8 03/03/2016 1226   HCT 44.4 10/26/2022 0824   HCT 41.1 03/03/2016 1226    PLT 410 10/26/2022 0824   MCV 90 10/26/2022 0824   MCV 91.7 03/03/2016 1226   MCH 28.9 10/26/2022 0824   MCH 29.4 04/14/2022 1638   MCHC 32.0 10/26/2022 0824   MCHC 33.6 04/14/2022 1638   RDW 14.4 10/26/2022 0824   RDW 14.1 03/03/2016 1226   Iron Studies No results found for: IRON, TIBC, FERRITIN, IRONPCTSAT Lipid Panel     Component Value Date/Time   CHOL 168 05/03/2023 1221   TRIG 176 (H) 05/03/2023 1221   HDL 37 (L) 05/03/2023 1221   CHOLHDL 6 06/21/2021 1029   VLDL 46.0 (H) 06/21/2021 1029   LDLCALC 100 (H) 05/03/2023 1221   LDLDIRECT 110.0 06/21/2021 1029   Hepatic Function Panel     Component Value Date/Time   PROT 8.1 05/03/2023 1221   PROT 8.2 03/03/2016 1226   ALBUMIN 4.4 05/03/2023 1221   ALBUMIN 3.7 03/03/2016 1226   AST 17 05/03/2023 1221   AST 21 01/08/2020 0911   AST 72 (H) 03/03/2016 1226   ALT 8 05/03/2023 1221   ALT 13 01/08/2020 0911   ALT 46 03/03/2016 1226   ALKPHOS 103 05/03/2023 1221   ALKPHOS 118 03/03/2016 1226   BILITOT 0.5 05/03/2023 1221   BILITOT 0.4 01/08/2020 0911   BILITOT 0.61 03/03/2016 1226      Component Value Date/Time   TSH 1.730 10/26/2022 0824   Nutritional Lab Results  Component Value Date   VD25OH 57.3 05/03/2023   VD25OH 40.5 10/26/2022   VD25OH 21.9 (L) 01/24/2022     Assessment and Plan Assessment & Plan Obesity Obesity management with a focus on weight loss. She has lost 2 pounds in the last 5 weeks. Currently following a category two eating plan about 70% of the time and doing chair yoga twice a week. Discussed challenges with cravings and the impact of diabetes on weight loss. Explored alternative eating plans including vegetarian, pescatarian, and low carbohydrate options. Discussed the body's natural resistance to weight loss due to diabetes and the importance of planning meals ahead to avoid unhealthy choices. - Switch to pescatarian eating plan with option to alternate with category two plan. -  Continue chair yoga twice a week. - Consider using a wobble cushion for core strengthening exercises at work.  Type 2 diabetes mellitus Type 2 diabetes with recent hemoglobin A1c of 8.5% in March. Blood sugar levels have been fluctuating between 121 and 165 mg/dL fasting. Current medications include Jardiance , Tresiba , metformin  XR, and Mounjaro . Discussed the importance of regular medication adherence and the impact of weight loss on blood sugar control. Emphasized the need for fasting before lab tests and the role of hydration in urine microalbumin testing. - Refill Jardiance  25 mg, Tresiba  20 units, metformin  XR 500 mg, and Mounjaro  15 mg. - Order hemoglobin A1c and urine  microalbumin tests. - Encourage adherence to medication regimen and lifestyle modifications.  Hyperlipidemia Hyperlipidemia management with lifestyle modifications and Crestor  5 mg. She is working on decreasing dietary cholesterol. - Refill Crestor  5 mg. - Order lipid panel. - Encourage adherence to medication regimen and lifestyle modifications.   Hypertension Hypertension associated with type 2 diabetes, currently well-controlled at 126/75 mmHg. She is on maxzide and working on weight loss to improve blood pressure. - Refill maxzide. - Encourage adherence to medication regimen and lifestyle modifications.   Vitamin D  deficiency Vitamin D  deficiency currently treated with prescription vitamin D  50,000 IU per day. Last vitamin D  level was 57.3 in March. - Refill vitamin D  50,000 IU. - Order vitamin D  level test.      She was informed of the importance of frequent follow up visits to maximize her success with intensive lifestyle modifications for her multiple health conditions. FU 4 weeks   Louann Penton, MD

## 2023-10-18 ENCOUNTER — Encounter (INDEPENDENT_AMBULATORY_CARE_PROVIDER_SITE_OTHER): Payer: Self-pay | Admitting: Family Medicine

## 2023-10-18 ENCOUNTER — Ambulatory Visit (INDEPENDENT_AMBULATORY_CARE_PROVIDER_SITE_OTHER): Admitting: Family Medicine

## 2023-10-18 VITALS — BP 120/77 | HR 70 | Temp 98.1°F | Ht 63.0 in | Wt 196.0 lb

## 2023-10-18 DIAGNOSIS — E669 Obesity, unspecified: Secondary | ICD-10-CM

## 2023-10-18 DIAGNOSIS — E119 Type 2 diabetes mellitus without complications: Secondary | ICD-10-CM | POA: Diagnosis not present

## 2023-10-18 DIAGNOSIS — E559 Vitamin D deficiency, unspecified: Secondary | ICD-10-CM | POA: Diagnosis not present

## 2023-10-18 DIAGNOSIS — Z794 Long term (current) use of insulin: Secondary | ICD-10-CM

## 2023-10-18 DIAGNOSIS — Z6834 Body mass index (BMI) 34.0-34.9, adult: Secondary | ICD-10-CM | POA: Diagnosis not present

## 2023-10-18 DIAGNOSIS — Z7985 Long-term (current) use of injectable non-insulin antidiabetic drugs: Secondary | ICD-10-CM

## 2023-10-18 DIAGNOSIS — E1169 Type 2 diabetes mellitus with other specified complication: Secondary | ICD-10-CM

## 2023-10-18 DIAGNOSIS — Z7984 Long term (current) use of oral hypoglycemic drugs: Secondary | ICD-10-CM

## 2023-10-18 LAB — CMP14+EGFR
ALT: 9 IU/L (ref 0–32)
AST: 15 IU/L (ref 0–40)
Albumin: 4.3 g/dL (ref 3.9–4.9)
Alkaline Phosphatase: 101 IU/L (ref 44–121)
BUN/Creatinine Ratio: 15 (ref 12–28)
BUN: 15 mg/dL (ref 8–27)
Bilirubin Total: 0.3 mg/dL (ref 0.0–1.2)
CO2: 26 mmol/L (ref 20–29)
Calcium: 9.7 mg/dL (ref 8.7–10.3)
Chloride: 98 mmol/L (ref 96–106)
Creatinine, Ser: 1 mg/dL (ref 0.57–1.00)
Globulin, Total: 3.5 g/dL (ref 1.5–4.5)
Glucose: 109 mg/dL — ABNORMAL HIGH (ref 70–99)
Potassium: 4.1 mmol/L (ref 3.5–5.2)
Sodium: 140 mmol/L (ref 134–144)
Total Protein: 7.8 g/dL (ref 6.0–8.5)
eGFR: 62 mL/min/1.73 (ref >59)

## 2023-10-18 LAB — VITAMIN D 25 HYDROXY (VIT D DEFICIENCY, FRACTURES): Vit D, 25-Hydroxy: 31 ng/mL (ref 30.0–100.0)

## 2023-10-18 LAB — HEMOGLOBIN A1C
Est. average glucose Bld gHb Est-mCnc: 151 mg/dL
Hgb A1c MFr Bld: 6.9 % — ABNORMAL HIGH (ref 4.8–5.6)

## 2023-10-18 LAB — LIPID PANEL WITH LDL/HDL RATIO
Cholesterol, Total: 166 mg/dL (ref 100–199)
HDL: 37 mg/dL — ABNORMAL LOW (ref >39)
LDL Chol Calc (NIH): 103 mg/dL — ABNORMAL HIGH (ref 0–99)
LDL/HDL Ratio: 2.8 ratio (ref 0.0–3.2)
Triglycerides: 144 mg/dL (ref 0–149)
VLDL Cholesterol Cal: 26 mg/dL (ref 5–40)

## 2023-10-18 LAB — MICROALBUMIN / CREATININE URINE RATIO
Creatinine, Urine: 112.5 mg/dL
Microalb/Creat Ratio: 4 mg/g{creat} (ref 0–29)
Microalbumin, Urine: 4.5 ug/mL

## 2023-10-18 LAB — TSH: TSH: 1.34 u[IU]/mL (ref 0.450–4.500)

## 2023-10-18 LAB — VITAMIN B12: Vitamin B-12: 407 pg/mL (ref 232–1245)

## 2023-10-18 MED ORDER — TRESIBA FLEXTOUCH 100 UNIT/ML ~~LOC~~ SOPN: 20.0000 [IU] | PEN_INJECTOR | Freq: Every day | SUBCUTANEOUS | 0 refills | Status: DC

## 2023-10-18 NOTE — Progress Notes (Signed)
 Office: [phone]  /  Fax: [phone]  WEIGHT SUMMARY AND BIOMETRICS  Anthropometric Measurements Height: 5' 3 (1.6 m) Weight: 196 lb (88.9 kg) BMI (Calculated): 34.73 Weight at Last Visit: 197 lb Weight Lost Since Last Visit: 1 lb Weight Gained Since Last Visit: 0 Starting Weight: 216 lb Total Weight Loss (lbs): 20 lb (9.072 kg) Peak Weight: 245 lb   Body Composition  Body Fat %: 40.5 % Fat Mass (lbs): 79.4 lbs Muscle Mass (lbs): 110.8 lbs Total Body Water  (lbs): 74.4 lbs Visceral Fat Rating : 12   Other Clinical Data Fasting: no Labs: no Today's Visit #: 55 Starting Date: 04/16/18    Chief Complaint: OBESITY   Discussed the use of AI scribe software for clinical note transcription with the patient, who gave verbal consent to proceed.  History of Present Illness Monica Clark is a 68 year old female with obesity who presents for a follow-up on her treatment plan and progress.  She adheres to a pescetarian diet approximately 75% of the time and engages in physical activity by doing chair yoga twice a week for 30 minutes. She has lost one pound in the last month.  She experiences increased hunger, particularly in the middle of the day around 2-3 PM. Her typical lunch consists of a salad and some fruit.  She is currently on Mounjaro  15 mg, Jardiance , Tresiba , and metformin . She does not take metformin  as prescribed due to diarrhea, opting to take it every other day instead. She has not taken metformin  daily for about 8-9 months.  Her fasting blood sugars are around 130 mg/dL, and she seldom checks her blood sugar after meals. Her recent hemoglobin A1c is 6.9%, improved from 8.5% in March and 7.6% last summer.  She struggles with remembering to take her vitamin D . Her LDL cholesterol is slightly above 100 mg/dL, but her liver and kidney functions are normal.      PHYSICAL EXAM:  Blood pressure 120/77, pulse 70, temperature 98.1 F (36.7 C), height 5' 3  (1.6 m), weight 196 lb (88.9 kg), SpO2 98%. Body mass index is 34.72 kg/m.  DIAGNOSTIC DATA REVIEWED:  BMET    Component Value Date/Time   NA 140 10/17/2023 0927   NA 141 03/03/2016 1226   K 4.1 10/17/2023 0927   K 3.5 03/03/2016 1226   CL 98 10/17/2023 0927   CO2 26 10/17/2023 0927   CO2 24 03/03/2016 1226   GLUCOSE 109 (H) 10/17/2023 0927   GLUCOSE 123 (H) 06/21/2021 1029   GLUCOSE 113 03/03/2016 1226   BUN 15 10/17/2023 0927   BUN 8.5 03/03/2016 1226   CREATININE 1.00 10/17/2023 0927   CREATININE 0.82 01/08/2020 0911   CREATININE 0.8 03/03/2016 1226   CALCIUM  9.7 10/17/2023 0927   CALCIUM  10.0 03/03/2016 1226   GFRNONAA >60 01/08/2020 0911   GFRAA >60 10/21/2018 1338   Lab Results  Component Value Date   HGBA1C 6.9 (H) 10/17/2023   HGBA1C (H) 10/24/2008    7.2 (NOTE) The ADA recommends the following therapeutic goal for glycemic control related to Hgb A1c measurement: Goal of therapy: <6.5 Hgb A1c  Reference: American Diabetes Association: Clinical Practice Recommendations 2010, Diabetes Care, 2010, 33: (Suppl  1).   Lab Results  Component Value Date   INSULIN  19.2 05/03/2023   INSULIN  8.7 04/16/2018   Lab Results  Component Value Date   TSH 1.340 10/17/2023   CBC    Component Value Date/Time   WBC 9.4 10/26/2022 0824  WBC 11.6 (H) 04/14/2022 1638   RBC 4.91 10/26/2022 0824   RBC 4.59 04/14/2022 1638   HGB 14.2 10/26/2022 0824   HGB 13.8 03/03/2016 1226   HCT 44.4 10/26/2022 0824   HCT 41.1 03/03/2016 1226   PLT 410 10/26/2022 0824   MCV 90 10/26/2022 0824   MCV 91.7 03/03/2016 1226   MCH 28.9 10/26/2022 0824   MCH 29.4 04/14/2022 1638   MCHC 32.0 10/26/2022 0824   MCHC 33.6 04/14/2022 1638   RDW 14.4 10/26/2022 0824   RDW 14.1 03/03/2016 1226   Iron Studies No results found for: IRON, TIBC, FERRITIN, IRONPCTSAT Lipid Panel     Component Value Date/Time   CHOL 166 10/17/2023 0927   TRIG 144 10/17/2023 0927   HDL 37 (L) 10/17/2023  0927   CHOLHDL 6 06/21/2021 1029   VLDL 46.0 (H) 06/21/2021 1029   LDLCALC 103 (H) 10/17/2023 0927   LDLDIRECT 110.0 06/21/2021 1029   Hepatic Function Panel     Component Value Date/Time   PROT 7.8 10/17/2023 0927   PROT 8.2 03/03/2016 1226   ALBUMIN 4.3 10/17/2023 0927   ALBUMIN 3.7 03/03/2016 1226   AST 15 10/17/2023 0927   AST 21 01/08/2020 0911   AST 72 (H) 03/03/2016 1226   ALT 9 10/17/2023 0927   ALT 13 01/08/2020 0911   ALT 46 03/03/2016 1226   ALKPHOS 101 10/17/2023 0927   ALKPHOS 118 03/03/2016 1226   BILITOT 0.3 10/17/2023 0927   BILITOT 0.4 01/08/2020 0911   BILITOT 0.61 03/03/2016 1226      Component Value Date/Time   TSH 1.340 10/17/2023 0927   Nutritional Lab Results  Component Value Date   VD25OH 31.0 10/17/2023   VD25OH 57.3 05/03/2023   VD25OH 40.5 10/26/2022     Assessment and Plan Assessment & Plan Obesity,  Obesity management with a pescetarian eating plan, followed 75% of the time. Exercising two days a week with chair yoga. Weight loss of one pound in the last month. Hunger issues noted, particularly in the middle of the day, likely due to inadequate protein intake at lunch. - Increase protein intake at lunch by adding four ounces of chicken or similar protein to salads. - Prepare protein in advance by grilling chicken and storing in containers for convenience. - Continue current exercise regimen with chair yoga.  Type 2 diabetes mellitus Type 2 diabetes managed with Mounjaro , Jardiance , Tresiba , and metformin . Mounjaro  dose is at the maximum of 15 mg. Metformin  is taken every other day due to gastrointestinal side effects. Hemoglobin A1c improved to 6.9 from 8.5 in March, indicating better glycemic control. Fasting blood sugars around 130 mg/dL. Vitamin D  deficiency may contribute to fatigue. - Send prescription for Tresiba  to [facility] in [place]. - Encourage checking blood sugar one to two hours after the largest meal a couple of times a week to  assess postprandial glucose levels. - Try taking metformin  extended release at bedtime to reduce gastrointestinal side effects and potentially increase efficacy. - Continue current medications: Mounjaro , Jardiance , and Tresiba .  Vitamin D  deficiency Vitamin D  levels have decreased, contributing to fatigue. Difficulty in remembering to take vitamin D  supplements consistently. - Consider using a large pill box to organize and remember to take vitamin D  supplements regularly.      She was informed of the importance of frequent follow up visits to maximize her success with intensive lifestyle modifications for her multiple health conditions.    Louann Penton, MD

## 2023-11-18 ENCOUNTER — Encounter (INDEPENDENT_AMBULATORY_CARE_PROVIDER_SITE_OTHER): Payer: Self-pay | Admitting: Family Medicine

## 2023-11-19 ENCOUNTER — Ambulatory Visit (INDEPENDENT_AMBULATORY_CARE_PROVIDER_SITE_OTHER): Admitting: Family Medicine

## 2023-11-27 ENCOUNTER — Encounter: Admitting: Family Medicine

## 2023-12-06 ENCOUNTER — Encounter: Payer: Self-pay | Admitting: Family Medicine

## 2023-12-06 ENCOUNTER — Ambulatory Visit: Admitting: Family Medicine

## 2023-12-06 VITALS — BP 130/82 | HR 67 | Temp 98.4°F | Ht 63.0 in | Wt 198.2 lb

## 2023-12-06 DIAGNOSIS — Z8542 Personal history of malignant neoplasm of other parts of uterus: Secondary | ICD-10-CM

## 2023-12-06 DIAGNOSIS — E782 Mixed hyperlipidemia: Secondary | ICD-10-CM

## 2023-12-06 DIAGNOSIS — E559 Vitamin D deficiency, unspecified: Secondary | ICD-10-CM | POA: Diagnosis not present

## 2023-12-06 DIAGNOSIS — Z0000 Encounter for general adult medical examination without abnormal findings: Secondary | ICD-10-CM | POA: Diagnosis not present

## 2023-12-06 DIAGNOSIS — F5104 Psychophysiologic insomnia: Secondary | ICD-10-CM

## 2023-12-06 DIAGNOSIS — E1165 Type 2 diabetes mellitus with hyperglycemia: Secondary | ICD-10-CM | POA: Diagnosis not present

## 2023-12-06 DIAGNOSIS — Z6838 Body mass index (BMI) 38.0-38.9, adult: Secondary | ICD-10-CM

## 2023-12-06 DIAGNOSIS — I152 Hypertension secondary to endocrine disorders: Secondary | ICD-10-CM

## 2023-12-06 DIAGNOSIS — E66812 Obesity, class 2: Secondary | ICD-10-CM

## 2023-12-06 DIAGNOSIS — E1159 Type 2 diabetes mellitus with other circulatory complications: Secondary | ICD-10-CM

## 2023-12-06 DIAGNOSIS — E1169 Type 2 diabetes mellitus with other specified complication: Secondary | ICD-10-CM

## 2023-12-06 DIAGNOSIS — E785 Hyperlipidemia, unspecified: Secondary | ICD-10-CM

## 2023-12-06 LAB — HM DIABETES FOOT EXAM

## 2023-12-06 NOTE — Assessment & Plan Note (Signed)
 Chronic, almost at goal < 100 LDl on rosuvastatin  5 mg daily

## 2023-12-06 NOTE — Assessment & Plan Note (Signed)
Improved with supplement. 

## 2023-12-06 NOTE — Assessment & Plan Note (Signed)
 [facility]   On mounjaro  15 mg weekly.

## 2023-12-06 NOTE — Assessment & Plan Note (Addendum)
 Stable, chronic.  Continue current medication.  Maxide 25/37.5 mg p.o. daily

## 2023-12-06 NOTE — Progress Notes (Signed)
 Patient ID: Monica Clark, female    DOB: 05-20-1955, 68 y.o.   MRN: [id number]  This visit was conducted in person.  BP 130/82   Pulse 67   Temp 98.4 F (36.9 C) (Oral)   Ht 5' 3 (1.6 m)   Wt 198 lb 4 oz (89.9 kg)   SpO2 98%   BMI 35.12 kg/m    CC:  Chief Complaint  Patient presents with   Annual Exam    Subjective:   HPI: Monica Clark is a 68 y.o. female presenting on 12/06/2023 for Annual Exam The patient presents for complete physical and review of chronic health problems. She does not have medicare. He/She also has the following acute concerns today:   Chronic issues sleeping. Trouble falling asleep but frequent waking.  No known snoring.  Sleepy at work.  Has tried melatonin, sleepy time tea.  She is retiring... minimal physical activity other than chair Yoga.   Diabetes:  good control on jardiance  25 mg daily, Mounjaro  15 mg weekly, metfomrin 500 mg daily Lab Results  Component Value Date   HGBA1C 6.9 (H) 10/17/2023  Using medications without difficulties: none Hypoglycemic episodes:none Hyperglycemic episodes: none Feet problems: no  ulcers Blood Sugars averaging: FBS 102-120,  eye exam within last year: uptodate.SABRA upcoming cataract surgery.  Hypertension:   At goal on Maxide  BP Readings from Last 3 Encounters:  12/17/23 127/75  12/06/23 130/82  10/18/23 120/77  Using medication without problems or lightheadedness:  none Chest pain with exertion:none Edema:none Short of breath:none Average home BPs: Other issues:  Elevated Cholesterol:  LDL not at goal < 100 on crestor  5 mg daily. Lab Results  Component Value Date   CHOL 166 10/17/2023   HDL 37 (L) 10/17/2023   LDLCALC 103 (H) 10/17/2023   LDLDIRECT 110.0 06/21/2021   TRIG 144 10/17/2023   CHOLHDL 6 06/21/2021  Using medications without problems:none Muscle aches:  none Diet compliance: moderate Exercise: minimal Other complaints:   [facility]  Reviewed last OV note from them 08/08/2021.. On Mounjaro  now.. titrating up Wt Readings from Last 3 Encounters:  12/17/23 191 lb (86.6 kg)  12/06/23 198 lb 4 oz (89.9 kg)  10/18/23 196 lb (88.9 kg)     Vit D def: she has been taking supplement more regularly in the last 3 week.  Wakes with low back pain on right in low back  Using Advil 200 mg 2 tab daily... does not help much.  Per pt had an MRI last year.      Relevant past medical, surgical, family and social history reviewed and updated as indicated. Interim medical history since our last visit reviewed. Allergies and medications reviewed and updated. Outpatient Medications Prior to Visit  Medication Sig Dispense Refill   diclofenac  (VOLTAREN ) 75 MG EC tablet Take 1 tablet (75 mg total) by mouth 2 (two) times daily as needed for moderate pain. 30 tablet 0   glucose blood (ONETOUCH VERIO) test strip 1 each by Other route as needed for other. Use to check blood sugar 2 times a day 100 each 4   Insulin  Pen Needle 31G X 5 MM MISC Use once a day 100 each 1   empagliflozin  (JARDIANCE ) 25 MG TABS tablet Take 1 tablet (25 mg total) by mouth daily. 90 tablet 0   insulin  degludec (TRESIBA  FLEXTOUCH) 100 UNIT/ML FlexTouch Pen Inject 20 Units into the skin daily. 18 mL 0   metFORMIN  (GLUCOPHAGE -XR) 500  MG 24 hr tablet Take 1 tablet (500 mg total) by mouth daily with breakfast. 90 tablet 0   rosuvastatin  (CRESTOR ) 5 MG tablet Take 1 tablet (5 mg total) by mouth daily. 90 tablet 0   tirzepatide  (MOUNJARO ) 15 MG/0.5ML Pen Inject 15 mg into the skin once a week. 6 mL 0   triamterene -hydrochlorothiazide (MAXZIDE-25) 37.5-25 MG tablet Take 1 tablet by mouth every morning. 90 tablet 0   Vitamin D , Ergocalciferol , (DRISDOL ) 1.25 MG (50000 UNIT) CAPS capsule Take 1 capsule (50,000 Units total) by mouth 2 (two) times a week. 25 capsule 0   colchicine  0.6 MG tablet Take 2 tablets (1.2mg ) then take 1 tablet (0.6mg ) an hour later. Continue 1 tablet daily  until pain resolves (Patient not taking: Reported on 12/06/2023) 15 tablet 0   predniSONE  (DELTASONE ) 10 MG tablet 3 tabs by mouth daily x 3 days, then 2 tabs by mouth daily x 2 days then 1 tab by mouth daily x 2 days (Patient not taking: Reported on 12/06/2023) 15 tablet 0   No facility-administered medications prior to visit.     Per HPI unless specifically indicated in ROS section below Review of Systems  Constitutional:  Negative for fatigue and fever.  HENT:  Negative for congestion.   Eyes:  Negative for pain.  Respiratory:  Negative for cough and shortness of breath.   Cardiovascular:  Negative for chest pain, palpitations and leg swelling.  Gastrointestinal:  Negative for abdominal pain.  Genitourinary:  Negative for dysuria and vaginal bleeding.  Musculoskeletal:  Negative for back pain.  Neurological:  Negative for syncope, light-headedness and headaches.  Psychiatric/Behavioral:  Negative for dysphoric mood.    Objective:  BP 130/82   Pulse 67   Temp 98.4 F (36.9 C) (Oral)   Ht 5' 3 (1.6 m)   Wt 198 lb 4 oz (89.9 kg)   SpO2 98%   BMI 35.12 kg/m   Wt Readings from Last 3 Encounters:  12/17/23 191 lb (86.6 kg)  12/06/23 198 lb 4 oz (89.9 kg)  10/18/23 196 lb (88.9 kg)      Physical Exam Vitals and nursing note reviewed.  Constitutional:      General: She is not in acute distress.    Appearance: Normal appearance. She is well-developed. She is not ill-appearing or toxic-appearing.  HENT:     Head: Normocephalic.     Right Ear: Hearing, tympanic membrane, ear canal and external ear normal.     Left Ear: Hearing, tympanic membrane, ear canal and external ear normal.     Nose: Nose normal.  Eyes:     General: Lids are normal. Lids are everted, no foreign bodies appreciated.     Conjunctiva/sclera: Conjunctivae normal.     Pupils: Pupils are equal, round, and reactive to light.  Neck:     Thyroid: No thyroid mass or thyromegaly.     Vascular: No carotid bruit.      Trachea: Trachea normal.  Cardiovascular:     Rate and Rhythm: Normal rate and regular rhythm.     Heart sounds: Normal heart sounds, S1 normal and S2 normal. No murmur heard.    No gallop.  Pulmonary:     Effort: Pulmonary effort is normal. No respiratory distress.     Breath sounds: Normal breath sounds. No wheezing, rhonchi or rales.  Abdominal:     General: Bowel sounds are normal. There is no distension or abdominal bruit.     Palpations: Abdomen is soft. There is no  fluid wave or mass.     Tenderness: There is no abdominal tenderness. There is no guarding or rebound.     Hernia: No hernia is present.  Musculoskeletal:     Cervical back: Normal range of motion and neck supple.  Lymphadenopathy:     Cervical: No cervical adenopathy.  Skin:    General: Skin is warm and dry.     Findings: No rash.  Neurological:     Mental Status: She is alert.     Cranial Nerves: No cranial nerve deficit.     Sensory: No sensory deficit.  Psychiatric:        Mood and Affect: Mood is not anxious or depressed.        Speech: Speech normal.        Behavior: Behavior normal. Behavior is cooperative.        Judgment: Judgment normal.       Diabetic foot exam: Normal inspection No skin breakdown No calluses  Normal DP pulses Normal sensation to light touch and monofilament Nails normal  Results for orders placed or performed in visit on 12/06/23  [facility] DIABETES FOOT EXAM   Collection Time: 12/06/23 12:00 AM  Result Value Ref Range   [facility] Diabetic Foot Exam done      COVID 19 screen:  No recent travel or known exposure to COVID19 The patient denies respiratory symptoms of COVID 19 at this time. The importance of social distancing was discussed today.   Assessment and Plan The patient's preventative maintenance and recommended screening tests for an annual wellness exam were reviewed in full today. Brought up to date unless services declined.  Counselled on the importance of diet,  exercise, and its role in overall health and mortality. The patient's FH and SH was reviewed, including their home life, tobacco status, and drug and alcohol status.    Vaccines: Shingrix due, Tdap due.. consider at pharmacy. COVID 19, RSV,  prevnar 20 recommended Pap/DVE: S/P hysterectomy,  for endometrial caner stage 1  Mammo: 05/24/2021... breast leakage.. followed by Dr. Vernetta, 06/2023 nml, Bone Density:   due Colon: 10/19/20, repeat in 10 years Smoking Status: ETOH/ drug use: none./none  Hep C:  done  HIV screen:   refused  Problem List Items Addressed This Visit     Chronic insomnia   Chronic issues, encouraged regular exercise and healthy sleep hygiene. Can use melatonin at night for sleep.      Class 2 severe obesity with serious comorbidity and body mass index (BMI) of 38.0 to 38.9 in adult   [facility]   On mounjaro  15 mg weekly.      History of endometrial cancer    History of.  No issues.       Hyperlipidemia associated with type 2 diabetes mellitus (HCC)   Chronic, reviewed labs in detail with patient.  Discussed ways to increase HDL and lower triglycerides and LDL.  She will continue Crestor  5 mg daily and will work on trying to be more consistent with taking this medication.      Hypertension associated with diabetes (HCC)   Stable, chronic.  Continue current medication.  Maxide 25/37.5 mg p.o. daily         Mixed hyperlipidemia    Chronic, almost at goal < 100 LDl on rosuvastatin  5 mg daily      Type 2 diabetes mellitus with hyperglycemia, without long-term current use of insulin  (HCC)   Chronic,  improved control  Tresiba   20 units daily Mounjaro  15 mg weekly She can only tolerate metformin  500 mg 1 tablet daily given side effect of diarrhea. Jardiance  25 mg daily at max.  Re-eval in 6 months.      Vitamin D  deficiency   Improved with supplement.      Other Visit Diagnoses       Routine general medical  examination at a health care facility    -  Primary         Greig Ring, MD

## 2023-12-06 NOTE — Assessment & Plan Note (Signed)
 Chronic,  improved control  Tresiba  20 units daily Mounjaro  15 mg weekly She can only tolerate metformin  500 mg 1 tablet daily given side effect of diarrhea. Jardiance  25 mg daily at max.  Re-eval in 6 months.

## 2023-12-06 NOTE — Assessment & Plan Note (Addendum)
 History of.  No issues.

## 2023-12-15 DIAGNOSIS — F5104 Psychophysiologic insomnia: Secondary | ICD-10-CM | POA: Insufficient documentation

## 2023-12-17 ENCOUNTER — Ambulatory Visit (INDEPENDENT_AMBULATORY_CARE_PROVIDER_SITE_OTHER): Admitting: Family Medicine

## 2023-12-17 ENCOUNTER — Encounter (INDEPENDENT_AMBULATORY_CARE_PROVIDER_SITE_OTHER): Payer: Self-pay | Admitting: Family Medicine

## 2023-12-17 VITALS — BP 127/75 | HR 84 | Temp 98.7°F | Ht 63.0 in | Wt 191.0 lb

## 2023-12-17 DIAGNOSIS — E1169 Type 2 diabetes mellitus with other specified complication: Secondary | ICD-10-CM

## 2023-12-17 DIAGNOSIS — E119 Type 2 diabetes mellitus without complications: Secondary | ICD-10-CM | POA: Diagnosis not present

## 2023-12-17 DIAGNOSIS — E66812 Obesity, class 2: Secondary | ICD-10-CM | POA: Diagnosis not present

## 2023-12-17 DIAGNOSIS — E559 Vitamin D deficiency, unspecified: Secondary | ICD-10-CM

## 2023-12-17 DIAGNOSIS — E785 Hyperlipidemia, unspecified: Secondary | ICD-10-CM

## 2023-12-17 DIAGNOSIS — Z7984 Long term (current) use of oral hypoglycemic drugs: Secondary | ICD-10-CM

## 2023-12-17 DIAGNOSIS — I152 Hypertension secondary to endocrine disorders: Secondary | ICD-10-CM

## 2023-12-17 DIAGNOSIS — Z6833 Body mass index (BMI) 33.0-33.9, adult: Secondary | ICD-10-CM

## 2023-12-17 DIAGNOSIS — Z7985 Long-term (current) use of injectable non-insulin antidiabetic drugs: Secondary | ICD-10-CM

## 2023-12-17 MED ORDER — TIRZEPATIDE 15 MG/0.5ML ~~LOC~~ SOAJ: 15.0000 mg | SUBCUTANEOUS | 0 refills | Status: DC

## 2023-12-17 MED ORDER — EMPAGLIFLOZIN 25 MG PO TABS: 25.0000 mg | ORAL_TABLET | Freq: Every day | ORAL | 0 refills | Status: DC

## 2023-12-17 MED ORDER — VITAMIN D (ERGOCALCIFEROL) 1.25 MG (50000 UNIT) PO CAPS: 50000.0000 [IU] | ORAL_CAPSULE | ORAL | 0 refills | Status: DC

## 2023-12-17 MED ORDER — ROSUVASTATIN CALCIUM 5 MG PO TABS: 5.0000 mg | ORAL_TABLET | Freq: Every day | ORAL | 0 refills | Status: DC

## 2023-12-17 MED ORDER — METFORMIN HCL ER 500 MG PO TB24: 500.0000 mg | ORAL_TABLET | Freq: Every day | ORAL | 0 refills | Status: DC

## 2023-12-17 MED ORDER — TRESIBA FLEXTOUCH 100 UNIT/ML ~~LOC~~ SOPN: 20.0000 [IU] | PEN_INJECTOR | Freq: Every day | SUBCUTANEOUS | 0 refills | Status: DC

## 2023-12-17 MED ORDER — TRIAMTERENE-HCTZ 37.5-25 MG PO TABS: 1.0000 | ORAL_TABLET | Freq: Every morning | ORAL | 0 refills | Status: DC

## 2023-12-17 NOTE — Progress Notes (Signed)
 Office: [phone]  /  Fax: [phone]  WEIGHT SUMMARY AND BIOMETRICS  Anthropometric Measurements Height: 5' 3 (1.6 m) Weight: 191 lb (86.6 kg) BMI (Calculated): 33.84 Weight at Last Visit: 196lb Weight Lost Since Last Visit: 5lb Weight Gained Since Last Visit: 0lb Starting Weight: 216lb Total Weight Loss (lbs): 25 lb (11.3 kg) Peak Weight: 245lb   Body Composition  Body Fat %: 44.1 % Fat Mass (lbs): 84.4 lbs Muscle Mass (lbs): 101.4 lbs Total Body Water  (lbs): 72 lbs Visceral Fat Rating : 13   Other Clinical Data Fasting: No Labs: no Today's Visit #: 56 Starting Date: 04/16/18    Chief Complaint: OBESITY  History of Present Illness Monica Clark is a 68 year old female with obesity who presents for a follow-up on her obesity treatment plan and progress assessment.  She adheres to a pescetarian diet approximately 75% of the time, focusing on increasing her intake of fruits and vegetables. She struggles to meet the recommended protein intake and hydration levels. She does not consistently achieve 7 to 9 hours of sleep per night.  Her physical activity includes exercising for 30 to 40 minutes, with yoga sessions three times a week. She has lost five pounds over the past two months since her last visit.  During a recent trip to [facility], she was mindful of her eating habits despite the challenges of hotel stays and high costs of snacks. She reports minimal alcohol consumption during the trip.  She monitors her blood sugar levels, which range from 108 to 141 mg/dL in the mornings. She is trying to identify dietary factors that may influence these fluctuations.  Her current medications include Mounjaro , Jardiance , metformin , Crestor , vitamin D , and Tresiba , with a noted dose of 21 units for Tresiba .  Her brother had a stroke, and she has been involved in caregiving for him. She is planning to retire soon and is involved with a forensic scientist. She is also  considering adjusting her gym routine to find a suitable time for exercise.      PHYSICAL EXAM:  Blood pressure 127/75, pulse 84, temperature 98.7 F (37.1 C), height 5' 3 (1.6 m), weight 191 lb (86.6 kg), SpO2 100%. Body mass index is 33.83 kg/m.  DIAGNOSTIC DATA REVIEWED:  BMET    Component Value Date/Time   NA 140 10/17/2023 0927   NA 141 03/03/2016 1226   K 4.1 10/17/2023 0927   K 3.5 03/03/2016 1226   CL 98 10/17/2023 0927   CO2 26 10/17/2023 0927   CO2 24 03/03/2016 1226   GLUCOSE 109 (H) 10/17/2023 0927   GLUCOSE 123 (H) 06/21/2021 1029   GLUCOSE 113 03/03/2016 1226   BUN 15 10/17/2023 0927   BUN 8.5 03/03/2016 1226   CREATININE 1.00 10/17/2023 0927   CREATININE 0.82 01/08/2020 0911   CREATININE 0.8 03/03/2016 1226   CALCIUM  9.7 10/17/2023 0927   CALCIUM  10.0 03/03/2016 1226   GFRNONAA >60 01/08/2020 0911   GFRAA >60 10/21/2018 1338   Lab Results  Component Value Date   HGBA1C 6.9 (H) 10/17/2023   HGBA1C (H) 10/24/2008    7.2 (NOTE) The ADA recommends the following therapeutic goal for glycemic control related to Hgb A1c measurement: Goal of therapy: <6.5 Hgb A1c  Reference: American Diabetes Association: Clinical Practice Recommendations 2010, Diabetes Care, 2010, 33: (Suppl  1).   Lab Results  Component Value Date   INSULIN  19.2 05/03/2023   INSULIN  8.7 04/16/2018   Lab Results  Component Value Date  TSH 1.340 10/17/2023   CBC    Component Value Date/Time   WBC 9.4 10/26/2022 0824   WBC 11.6 (H) 04/14/2022 1638   RBC 4.91 10/26/2022 0824   RBC 4.59 04/14/2022 1638   HGB 14.2 10/26/2022 0824   HGB 13.8 03/03/2016 1226   HCT 44.4 10/26/2022 0824   HCT 41.1 03/03/2016 1226   PLT 410 10/26/2022 0824   MCV 90 10/26/2022 0824   MCV 91.7 03/03/2016 1226   MCH 28.9 10/26/2022 0824   MCH 29.4 04/14/2022 1638   MCHC 32.0 10/26/2022 0824   MCHC 33.6 04/14/2022 1638   RDW 14.4 10/26/2022 0824   RDW 14.1 03/03/2016 1226   Iron Studies No  results found for: IRON, TIBC, FERRITIN, IRONPCTSAT Lipid Panel     Component Value Date/Time   CHOL 166 10/17/2023 0927   TRIG 144 10/17/2023 0927   HDL 37 (L) 10/17/2023 0927   CHOLHDL 6 06/21/2021 1029   VLDL 46.0 (H) 06/21/2021 1029   LDLCALC 103 (H) 10/17/2023 0927   LDLDIRECT 110.0 06/21/2021 1029   Hepatic Function Panel     Component Value Date/Time   PROT 7.8 10/17/2023 0927   PROT 8.2 03/03/2016 1226   ALBUMIN 4.3 10/17/2023 0927   ALBUMIN 3.7 03/03/2016 1226   AST 15 10/17/2023 0927   AST 21 01/08/2020 0911   AST 72 (H) 03/03/2016 1226   ALT 9 10/17/2023 0927   ALT 13 01/08/2020 0911   ALT 46 03/03/2016 1226   ALKPHOS 101 10/17/2023 0927   ALKPHOS 118 03/03/2016 1226   BILITOT 0.3 10/17/2023 0927   BILITOT 0.4 01/08/2020 0911   BILITOT 0.61 03/03/2016 1226      Component Value Date/Time   TSH 1.340 10/17/2023 0927   Nutritional Lab Results  Component Value Date   VD25OH 31.0 10/17/2023   VD25OH 57.3 05/03/2023   VD25OH 40.5 10/26/2022     Assessment and Plan Assessment & Plan Class 2 Obesity Class 2 obesity with recent weight loss of 5 pounds over the last two months. She follows a pescetarian diet 75% of the time, incorporating more fruits and vegetables but not meeting recommended protein intake, leading to muscle loss and potential metabolic impact. She exercises 30-40 minutes, including yoga three days a week. Sleep and hydration are suboptimal. - Encourage adherence to pescetarian diet with increased protein intake to at least 85 grams per day. - Consider protein shakes like Premier Protein or Fairlife if dietary intake is insufficient. - Advise tracking protein intake for a week to ensure meeting the target. - Encourage continued exercise and yoga, emphasizing the importance of protein intake for muscle maintenance. - Discuss the importance of adequate sleep and hydration.  Type 2 Diabetes Mellitus Type 2 diabetes mellitus with morning  blood sugar levels ranging from 108 to 141 mg/dL. Elevated levels may be due to excessive carbohydrate intake at night or nocturnal hypoglycemia with subsequent hepatic glucose release. - Refill Mounjaro , Jardiance , and metformin  for diabetes management. - Advise checking blood sugar at bedtime to differentiate between causes of elevated morning blood sugars. - Recommend a protein snack, such as yogurt, before bed if nocturnal hypoglycemia is suspected.  Vitamin D  Deficiency Vitamin D  deficiency. - Refill vitamin D  prescription.  Hyperlipidemia Hyperlipidemia, currently managed with Crestor  without issues. - Refill Crestor  prescription. - Continue diet, exercise and weight loss as discussed today as an important part of the treatment plan   Follow-Up Follow-up appointments scheduled to monitor progress and adjust treatment plans as necessary. -  Schedule follow-up appointment for November 20th at 2:40 PM to discuss Thanksgiving strategies. - Schedule follow-up appointment for December 31st at 11:40 AM.   Monica Clark was counseled on the importance of maintaining healthy lifestyle habits, including balanced nutrition, regular physical activity, and behavioral modifications, while taking antiobesity medication.  Patient verbalized understanding that medication is an adjunct to, not a replacement for, lifestyle changes and that the long-term success and weight maintenance depend on continued adherence to these strategies.   Monica Clark was informed of the importance of frequent follow up visits to maximize her success with intensive lifestyle modifications for her obesity and obesity related health conditions as recommended by USPSTF and CMS guidelines   Louann Penton, MD

## 2023-12-24 ENCOUNTER — Encounter: Payer: Self-pay | Admitting: Radiology

## 2023-12-26 NOTE — Assessment & Plan Note (Signed)
 Chronic issues, encouraged regular exercise and healthy sleep hygiene. Can use melatonin at night for sleep.

## 2023-12-26 NOTE — Assessment & Plan Note (Signed)
Chronic, reviewed labs in detail with patient.  Discussed ways to increase HDL and lower triglycerides and LDL.  She will continue Crestor 5 mg daily and will work on trying to be more consistent with taking this medication.

## 2024-01-10 ENCOUNTER — Encounter (INDEPENDENT_AMBULATORY_CARE_PROVIDER_SITE_OTHER): Payer: Self-pay | Admitting: Family Medicine

## 2024-01-10 ENCOUNTER — Ambulatory Visit (INDEPENDENT_AMBULATORY_CARE_PROVIDER_SITE_OTHER): Payer: Self-pay | Admitting: Family Medicine

## 2024-01-10 VITALS — BP 112/69 | HR 60 | Temp 97.8°F | Ht 63.0 in | Wt 192.0 lb

## 2024-01-10 DIAGNOSIS — Z7985 Long-term (current) use of injectable non-insulin antidiabetic drugs: Secondary | ICD-10-CM

## 2024-01-10 DIAGNOSIS — Z6834 Body mass index (BMI) 34.0-34.9, adult: Secondary | ICD-10-CM

## 2024-01-10 DIAGNOSIS — E669 Obesity, unspecified: Secondary | ICD-10-CM

## 2024-01-10 DIAGNOSIS — E559 Vitamin D deficiency, unspecified: Secondary | ICD-10-CM

## 2024-01-10 DIAGNOSIS — E1169 Type 2 diabetes mellitus with other specified complication: Secondary | ICD-10-CM

## 2024-01-10 DIAGNOSIS — E785 Hyperlipidemia, unspecified: Secondary | ICD-10-CM

## 2024-01-10 DIAGNOSIS — I10 Essential (primary) hypertension: Secondary | ICD-10-CM | POA: Diagnosis not present

## 2024-01-10 DIAGNOSIS — E119 Type 2 diabetes mellitus without complications: Secondary | ICD-10-CM

## 2024-01-10 DIAGNOSIS — Z794 Long term (current) use of insulin: Secondary | ICD-10-CM

## 2024-01-10 DIAGNOSIS — Z7984 Long term (current) use of oral hypoglycemic drugs: Secondary | ICD-10-CM | POA: Diagnosis not present

## 2024-01-10 DIAGNOSIS — E1159 Type 2 diabetes mellitus with other circulatory complications: Secondary | ICD-10-CM

## 2024-01-10 MED ORDER — TIRZEPATIDE 15 MG/0.5ML ~~LOC~~ SOAJ: 15.0000 mg | SUBCUTANEOUS | 0 refills | Status: DC

## 2024-01-10 MED ORDER — ROSUVASTATIN CALCIUM 5 MG PO TABS: 5.0000 mg | ORAL_TABLET | Freq: Every day | ORAL | 0 refills | Status: DC

## 2024-01-10 MED ORDER — VITAMIN D (ERGOCALCIFEROL) 1.25 MG (50000 UNIT) PO CAPS: 50000.0000 [IU] | ORAL_CAPSULE | ORAL | 0 refills | Status: DC

## 2024-01-10 MED ORDER — METFORMIN HCL ER 500 MG PO TB24: 500.0000 mg | ORAL_TABLET | Freq: Every day | ORAL | 0 refills | Status: DC

## 2024-01-10 MED ORDER — TRIAMTERENE-HCTZ 37.5-25 MG PO TABS: 1.0000 | ORAL_TABLET | Freq: Every morning | ORAL | 0 refills | Status: DC

## 2024-01-10 MED ORDER — TRESIBA FLEXTOUCH 100 UNIT/ML ~~LOC~~ SOPN: 20.0000 [IU] | PEN_INJECTOR | Freq: Every day | SUBCUTANEOUS | 0 refills | Status: DC

## 2024-01-10 MED ORDER — EMPAGLIFLOZIN 25 MG PO TABS: 25.0000 mg | ORAL_TABLET | Freq: Every day | ORAL | 0 refills | Status: DC

## 2024-01-10 NOTE — Addendum Note (Signed)
 Addended by: LAFE BAKER CROME on: 01/10/2024 04:02 PM   Modules accepted: Level of Service

## 2024-01-10 NOTE — Progress Notes (Signed)
 Office: [phone]  /  Fax: [phone]  WEIGHT SUMMARY AND BIOMETRICS  Anthropometric Measurements Height: 5' 3 (1.6 m) Weight: 192 lb (87.1 kg) BMI (Calculated): 34.02 Weight at Last Visit: 191 lb Weight Lost Since Last Visit: 0 Weight Gained Since Last Visit: 1 lb Starting Weight: 216 lb Total Weight Loss (lbs): 24 lb (10.9 kg) Peak Weight: 245 lb   Body Composition  Body Fat %: 44.5 % Fat Mass (lbs): 85.6 lbs Muscle Mass (lbs): 101.2 lbs Total Body Water  (lbs): 73 lbs Visceral Fat Rating : 13   Other Clinical Data Fasting: no Labs: no Today's Visit #: 52 Starting Date: 04/16/18    Chief Complaint: OBESITY   History of Present Illness Monica Clark is a 68 year old female with obesity and type 2 diabetes who presents for obesity treatment and progress assessment.  She has been adhering to a pescetarian diet approximately 80% of the time but is not meeting the recommended protein intake. She engages in physical activity three to four times a week for about 45 minutes at the gym. She has experienced a weight gain of one pound over the past month.  Her type 2 diabetes is managed with Mounjaro  15 mg, Jardiance  25 mg, Tresiba  20 units daily, and Metformin  500 mg daily. She requests refills for these medications. Her most recent laboratory results from August indicate a hemoglobin A1c of 6.9% and a fasting glucose of 109 mg/dL, showing improvement from an A1c of 8.5% earlier this year. She noted a blood sugar level of 81 mg/dL this morning, which she found unexpectedly low, although she did not experience any discomfort.  She is on Crestor  5 mg for hyperlipidemia associated with type 2 diabetes, with an LDL of 103 mg/dL in August, and requests a refill. She has maintained this dosage for approximately a year and a half.  She takes triamterene  hydrochlorothiazide for hypertension and has an allergy to lisinopril , which causes tongue swelling.  Her social history  includes attending family gatherings, such as Thanksgiving at her sister's place, where traditional dishes are served. She is mindful of portion control during these events.      PHYSICAL EXAM:  Blood pressure 112/69, pulse 60, temperature 97.8 F (36.6 C), height 5' 3 (1.6 m), weight 192 lb (87.1 kg), SpO2 99%. Body mass index is 34.01 kg/m.  DIAGNOSTIC DATA REVIEWED:  BMET    Component Value Date/Time   NA 140 10/17/2023 0927   NA 141 03/03/2016 1226   K 4.1 10/17/2023 0927   K 3.5 03/03/2016 1226   CL 98 10/17/2023 0927   CO2 26 10/17/2023 0927   CO2 24 03/03/2016 1226   GLUCOSE 109 (H) 10/17/2023 0927   GLUCOSE 123 (H) 06/21/2021 1029   GLUCOSE 113 03/03/2016 1226   BUN 15 10/17/2023 0927   BUN 8.5 03/03/2016 1226   CREATININE 1.00 10/17/2023 0927   CREATININE 0.82 01/08/2020 0911   CREATININE 0.8 03/03/2016 1226   CALCIUM  9.7 10/17/2023 0927   CALCIUM  10.0 03/03/2016 1226   GFRNONAA >60 01/08/2020 0911   GFRAA >60 10/21/2018 1338   Lab Results  Component Value Date   HGBA1C 6.9 (H) 10/17/2023   HGBA1C (H) 10/24/2008    7.2 (NOTE) The ADA recommends the following therapeutic goal for glycemic control related to Hgb A1c measurement: Goal of therapy: <6.5 Hgb A1c  Reference: American Diabetes Association: Clinical Practice Recommendations 2010, Diabetes Care, 2010, 33: (Suppl  1).   Lab Results  Component Value Date  INSULIN  19.2 05/03/2023   INSULIN  8.7 04/16/2018   Lab Results  Component Value Date   TSH 1.340 10/17/2023   CBC    Component Value Date/Time   WBC 9.4 10/26/2022 0824   WBC 11.6 (H) 04/14/2022 1638   RBC 4.91 10/26/2022 0824   RBC 4.59 04/14/2022 1638   HGB 14.2 10/26/2022 0824   HGB 13.8 03/03/2016 1226   HCT 44.4 10/26/2022 0824   HCT 41.1 03/03/2016 1226   PLT 410 10/26/2022 0824   MCV 90 10/26/2022 0824   MCV 91.7 03/03/2016 1226   MCH 28.9 10/26/2022 0824   MCH 29.4 04/14/2022 1638   MCHC 32.0 10/26/2022 0824   MCHC 33.6  04/14/2022 1638   RDW 14.4 10/26/2022 0824   RDW 14.1 03/03/2016 1226   Iron Studies No results found for: IRON, TIBC, FERRITIN, IRONPCTSAT Lipid Panel     Component Value Date/Time   CHOL 166 10/17/2023 0927   TRIG 144 10/17/2023 0927   HDL 37 (L) 10/17/2023 0927   CHOLHDL 6 06/21/2021 1029   VLDL 46.0 (H) 06/21/2021 1029   LDLCALC 103 (H) 10/17/2023 0927   LDLDIRECT 110.0 06/21/2021 1029   Hepatic Function Panel     Component Value Date/Time   PROT 7.8 10/17/2023 0927   PROT 8.2 03/03/2016 1226   ALBUMIN 4.3 10/17/2023 0927   ALBUMIN 3.7 03/03/2016 1226   AST 15 10/17/2023 0927   AST 21 01/08/2020 0911   AST 72 (H) 03/03/2016 1226   ALT 9 10/17/2023 0927   ALT 13 01/08/2020 0911   ALT 46 03/03/2016 1226   ALKPHOS 101 10/17/2023 0927   ALKPHOS 118 03/03/2016 1226   BILITOT 0.3 10/17/2023 0927   BILITOT 0.4 01/08/2020 0911   BILITOT 0.61 03/03/2016 1226      Component Value Date/Time   TSH 1.340 10/17/2023 0927   Nutritional Lab Results  Component Value Date   VD25OH 31.0 10/17/2023   VD25OH 57.3 05/03/2023   VD25OH 40.5 10/26/2022     Assessment and Plan Assessment & Plan Obesity Management is ongoing with a pescetarian diet followed 80% of the time, though protein intake is below recommended levels, potentially affecting weight loss. Exercise routine includes 3-4 days per week at the gym for 45 minutes, focusing on biking and strength training. Recent weight gain of 1 pound in the last month. Discussed strategies for managing holiday eating, including portion control and the 'don't let it touch' strategy to reduce caloric intake by 25%. - Continue pescetarian diet with increased protein intake. - Maintain current exercise routine. - Implement portion control strategies during holiday meals.  Type 2 diabetes mellitus Type 2 diabetes is managed with Mounjaro  15 mg, Jardiance  25 mg, Tresiba  20 units daily, and metformin  500 mg daily. Recent labs show  hemoglobin A1c of 6.9 and fasting glucose of 109, improved from earlier in the year. Blood sugar levels mostly range between 100 and 150, with occasional lower readings. Discussed the importance of avoiding hypoglycemia, especially on insulin  therapy. - Refilled Mounjaro , Jardiance , Tresiba , and metformin  prescriptions. - Ordered fasting labs to monitor blood sugar levels. - Educated on recognizing symptoms of hypoglycemia and managing blood sugar levels.  Hyperlipidemia Managed with Crestor  5 mg, though LDL remains at 103, above the target of below 70. Discussed the possibility of increasing Crestor  dosage if LDL levels do not improve. - Refilled Crestor  prescription. - Ordered fasting labs to assess cholesterol levels. - Will consider increasing Crestor  dosage if LDL levels remain above target.  Vit D deficiency Stable on vit D Rx, last level not yet at goal. She is due to have labs done soon -Check labs - Refill vit D and follow  Essential hypertension Well-controlled with triamterene  hydrochlorothiazide. Blood pressure today is 112/69. Allergy to lisinopril  noted with tongue swelling. - Continue current antihypertensive regimen. - Ordered labs to monitor kidney function and blood pressure.      Patients who are on anti-obesity medications are counseled on the importance of maintaining healthy lifestyle habits, including balanced nutrition, regular physical activity, and behavioral modifications,  Medication is an adjunct to, not a replacement for, lifestyle changes and that the long-term success and weight maintenance depend on continued adherence to these strategies.   Johnica was informed of the importance of frequent follow up visits to maximize her success with intensive lifestyle modifications for her obesity and obesity related health conditions as recommended by USPSTF and CMS guidelines  Louann Penton, MD

## 2024-01-16 ENCOUNTER — Ambulatory Visit (INDEPENDENT_AMBULATORY_CARE_PROVIDER_SITE_OTHER): Admitting: Family Medicine

## 2024-02-20 ENCOUNTER — Ambulatory Visit (INDEPENDENT_AMBULATORY_CARE_PROVIDER_SITE_OTHER): Payer: Self-pay | Admitting: Family Medicine

## 2024-03-19 ENCOUNTER — Ambulatory Visit (INDEPENDENT_AMBULATORY_CARE_PROVIDER_SITE_OTHER): Admitting: Family Medicine

## 2024-03-19 ENCOUNTER — Encounter (INDEPENDENT_AMBULATORY_CARE_PROVIDER_SITE_OTHER): Payer: Self-pay | Admitting: Family Medicine

## 2024-03-19 VITALS — BP 126/72 | HR 75 | Temp 97.7°F | Ht 63.0 in | Wt 189.0 lb

## 2024-03-19 DIAGNOSIS — I10 Essential (primary) hypertension: Secondary | ICD-10-CM | POA: Diagnosis not present

## 2024-03-19 DIAGNOSIS — Z7985 Long-term (current) use of injectable non-insulin antidiabetic drugs: Secondary | ICD-10-CM

## 2024-03-19 DIAGNOSIS — E559 Vitamin D deficiency, unspecified: Secondary | ICD-10-CM

## 2024-03-19 DIAGNOSIS — E119 Type 2 diabetes mellitus without complications: Secondary | ICD-10-CM | POA: Diagnosis not present

## 2024-03-19 DIAGNOSIS — E669 Obesity, unspecified: Secondary | ICD-10-CM

## 2024-03-19 DIAGNOSIS — Z7984 Long term (current) use of oral hypoglycemic drugs: Secondary | ICD-10-CM | POA: Diagnosis not present

## 2024-03-19 DIAGNOSIS — Z6833 Body mass index (BMI) 33.0-33.9, adult: Secondary | ICD-10-CM | POA: Diagnosis not present

## 2024-03-19 DIAGNOSIS — E1159 Type 2 diabetes mellitus with other circulatory complications: Secondary | ICD-10-CM

## 2024-03-19 DIAGNOSIS — Z794 Long term (current) use of insulin: Secondary | ICD-10-CM | POA: Diagnosis not present

## 2024-03-19 DIAGNOSIS — E785 Hyperlipidemia, unspecified: Secondary | ICD-10-CM | POA: Diagnosis not present

## 2024-03-19 DIAGNOSIS — E1169 Type 2 diabetes mellitus with other specified complication: Secondary | ICD-10-CM

## 2024-03-19 MED ORDER — VITAMIN D (ERGOCALCIFEROL) 1.25 MG (50000 UNIT) PO CAPS: 50000.0000 [IU] | ORAL_CAPSULE | ORAL | 0 refills | Status: AC

## 2024-03-19 MED ORDER — TRIAMTERENE-HCTZ 37.5-25 MG PO TABS: 1.0000 | ORAL_TABLET | Freq: Every morning | ORAL | 0 refills | Status: AC

## 2024-03-19 MED ORDER — TIRZEPATIDE 15 MG/0.5ML ~~LOC~~ SOAJ: 15.0000 mg | SUBCUTANEOUS | 0 refills | Status: AC

## 2024-03-19 MED ORDER — TRESIBA FLEXTOUCH 100 UNIT/ML ~~LOC~~ SOPN: 20.0000 [IU] | PEN_INJECTOR | Freq: Every day | SUBCUTANEOUS | 0 refills | Status: AC

## 2024-03-19 MED ORDER — METFORMIN HCL ER 500 MG PO TB24: 500.0000 mg | ORAL_TABLET | Freq: Every day | ORAL | 0 refills | Status: AC

## 2024-03-19 MED ORDER — EMPAGLIFLOZIN 25 MG PO TABS: 25.0000 mg | ORAL_TABLET | Freq: Every day | ORAL | 0 refills | Status: AC

## 2024-03-19 MED ORDER — ROSUVASTATIN CALCIUM 5 MG PO TABS: 5.0000 mg | ORAL_TABLET | Freq: Every day | ORAL | 0 refills | Status: AC

## 2024-03-19 NOTE — Progress Notes (Signed)
 "  Office: [phone]  /  Fax: [phone]  WEIGHT SUMMARY AND BIOMETRICS  Anthropometric Measurements Height: 5' 3 (1.6 m) Weight: 189 lb (85.7 kg) BMI (Calculated): 33.49 Weight at Last Visit: 192 lb Weight Lost Since Last Visit: 3 lb Weight Gained Since Last Visit: 0 Starting Weight: 216 lb Total Weight Loss (lbs): 27 lb (12.2 kg) Peak Weight: 245 lb   Body Composition  Body Fat %: 41.4 % Fat Mass (lbs): 78.6 lbs Muscle Mass (lbs): 105.6 lbs Total Body Water  (lbs): 69 lbs Visceral Fat Rating : 12   Other Clinical Data Fasting: no Labs: no Today's Visit #: 38 Starting Date: 04/16/18    Chief Complaint: OBESITY   History of Present Illness Monica Clark is a 69 year old female with obesity and type 2 diabetes who presents for obesity treatment plan assessment and progress evaluation.  She adheres to a pescetarian eating plan 80% of the time but struggles with protein intake and occasionally skips meals. Her physical activity includes gym workouts or chair yoga four days a week for 60 minutes. Over the past two months, she has lost three pounds, including during the holiday season.  Her type 2 diabetes is managed with diet, exercise, weight loss, and medications. Current medications include Jardiance  25 mg, Metformin  XR 500 mg, Tresiba  20 units per day, and Mounjaro  15 units per week. Her last hemoglobin A1c in August was 6.9, improved from 8.5 earlier in the year.  Hypertension is managed with diet, exercise, weight loss, and Maxide 25 mg.  Hyperlipidemia is treated with diet, exercise, weight loss, and Crestor  5 mg. Her LDL was above goal at 103 mg/dL in the last lab results.  She has a vitamin D  deficiency and is on prescription ergocalciferol  50,000 IU twice a week. Her vitamin D  level was low at 31 ng/mL in the last lab results.  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       PHYSICAL EXAM:  Blood pressure 126/72, pulse 75, temperature 97.7 F (36.5 C), height 5' 3 (1.6 m), weight 189 lb (85.7 kg), SpO2 96%. Body mass index is 33.48 kg/m.  DIAGNOSTIC DATA REVIEWED BY MYSELF TODAY:  BMET    Component Value Date/Time   NA 140 10/17/2023 0927   NA 141 03/03/2016 1226   K 4.1 10/17/2023 0927   K 3.5 03/03/2016 1226   CL 98 10/17/2023 0927   CO2 26 10/17/2023 0927   CO2 24 03/03/2016 1226   GLUCOSE 109 (H) 10/17/2023 0927   GLUCOSE 123 (H) 06/21/2021 1029   GLUCOSE 113 03/03/2016 1226   BUN 15 10/17/2023 0927   BUN 8.5 03/03/2016 1226   CREATININE 1.00 10/17/2023 0927   CREATININE 0.82 01/08/2020 0911   CREATININE 0.8 03/03/2016 1226   CALCIUM  9.7 10/17/2023 0927   CALCIUM  10.0 03/03/2016 1226   GFRNONAA >60 01/08/2020 0911   GFRAA >60 10/21/2018 1338   Lab Results  Component Value Date   HGBA1C 6.9 (H) 10/17/2023   HGBA1C (H) 10/24/2008    7.2 (NOTE) The ADA recommends the following therapeutic goal for glycemic control related to Hgb A1c measurement: Goal of therapy: <6.5 Hgb A1c  Reference: American Diabetes Association: Clinical Practice Recommendations 2010, Diabetes Care, 2010, 33: (Suppl  1).   Lab Results  Component Value Date   INSULIN  19.2 05/03/2023   INSULIN  8.7 04/16/2018   Lab Results  Component Value Date   TSH  1.340 10/17/2023   CBC    Component Value Date/Time   WBC 9.4 10/26/2022 0824   WBC 11.6 (H) 04/14/2022 1638   RBC 4.91 10/26/2022 0824   RBC 4.59 04/14/2022 1638   HGB 14.2 10/26/2022 0824   HGB 13.8 03/03/2016 1226   HCT 44.4 10/26/2022 0824   HCT 41.1 03/03/2016 1226   PLT 410 10/26/2022 0824   MCV 90 10/26/2022 0824   MCV 91.7 03/03/2016 1226   MCH 28.9 10/26/2022 0824   MCH 29.4 04/14/2022 1638   MCHC 32.0 10/26/2022 0824   MCHC 33.6 04/14/2022 1638   RDW 14.4 10/26/2022 0824   RDW 14.1 03/03/2016 1226   Iron Studies No results found for: IRON, TIBC, FERRITIN, IRONPCTSAT Lipid  Panel     Component Value Date/Time   CHOL 166 10/17/2023 0927   TRIG 144 10/17/2023 0927   HDL 37 (L) 10/17/2023 0927   CHOLHDL 6 06/21/2021 1029   VLDL 46.0 (H) 06/21/2021 1029   LDLCALC 103 (H) 10/17/2023 0927   LDLDIRECT 110.0 06/21/2021 1029   Hepatic Function Panel     Component Value Date/Time   PROT 7.8 10/17/2023 0927   PROT 8.2 03/03/2016 1226   ALBUMIN 4.3 10/17/2023 0927   ALBUMIN 3.7 03/03/2016 1226   AST 15 10/17/2023 0927   AST 21 01/08/2020 0911   AST 72 (H) 03/03/2016 1226   ALT 9 10/17/2023 0927   ALT 13 01/08/2020 0911   ALT 46 03/03/2016 1226   ALKPHOS 101 10/17/2023 0927   ALKPHOS 118 03/03/2016 1226   BILITOT 0.3 10/17/2023 0927   BILITOT 0.4 01/08/2020 0911   BILITOT 0.61 03/03/2016 1226      Component Value Date/Time   TSH 1.340 10/17/2023 0927   Nutritional Lab Results  Component Value Date   VD25OH 31.0 10/17/2023   VD25OH 57.3 05/03/2023   VD25OH 40.5 10/26/2022     Assessment and Plan Assessment & Plan Obesity Management is ongoing with a pescetarian diet, exercise, and weight loss. She has lost three pounds in the last two months, including over the holidays. Struggles with protein intake, especially at breakfast, and occasionally skips meals. Engages in physical activity four days a week for 60 minutes, including chair yoga. Muscle mass has improved, and fat loss is noted, with a decrease in visceral fat. - Continue pescetarian diet and exercise regimen. - Encouraged increased protein intake through options like Fairlife milk, string cheese, and tuna packs. - Provided recipes for meal prepping to increase protein intake. - Encouraged continued physical activity, including strengthening exercises.  Type 2 diabetes mellitus Managed with diet, exercise, weight loss, and medications including Jardiance , metformin  XR, Tresiba , and Mounjaro . Hemoglobin A1c has improved from 8.5 to 6.9 since last year. She requests refills for all  medications. Goal is to reduce insulin  use, as other medications have favorable side effects. - Refilled Jardiance , metformin  XR, Tresiba , and Mounjaro . - Encouraged continued weight loss and exercise to potentially reduce insulin  use.  Hyperlipidemia Managed with diet, exercise, weight loss, and Crestor . LDL was above goal at 103 in August. She requests a refill of Crestor . - Refilled Crestor . - Encouraged continued diet and exercise to manage lipid levels.  Hypertension Well controlled with a blood pressure of 126/72. Managed with diet, exercise, weight loss, and Maxzide. She requests a refill of Maxzide. - Refilled Maxzide. - Continue current management plan.  Vitamin D  deficiency Managed with prescription ergocalciferol . Vitamin D  level was low at 31 in August. She requests a refill. -  Refilled ergocalciferol . - Encouraged follow-up labs to monitor vitamin D  levels.      Patients who are on anti-obesity medications are counseled on the importance of maintaining healthy lifestyle habits, including balanced nutrition, regular physical activity, and behavioral modifications,  Medication is an adjunct to, not a replacement for, lifestyle changes and that the long-term success and weight maintenance depend on continued adherence to these strategies.   Dondrea was informed of the importance of frequent follow up visits to maximize her success with intensive lifestyle modifications for her obesity and obesity related health conditions as recommended by USPSTF and CMS guidelines  Louann Penton, MD   "

## 2024-04-29 ENCOUNTER — Ambulatory Visit (INDEPENDENT_AMBULATORY_CARE_PROVIDER_SITE_OTHER): Admitting: Family Medicine
